# Patient Record
Sex: Male | Born: 1940 | Race: Black or African American | Hispanic: No | Marital: Married | Smoking: Former smoker
Health system: Southern US, Community
[De-identification: ages and names within clinical notes are randomized; demographics above are authoritative.]

## PROBLEM LIST (undated history)

## (undated) DIAGNOSIS — E041 Nontoxic single thyroid nodule: Secondary | ICD-10-CM

## (undated) DIAGNOSIS — K5730 Diverticulosis of large intestine without perforation or abscess without bleeding: Secondary | ICD-10-CM

## (undated) DIAGNOSIS — M109 Gout, unspecified: Secondary | ICD-10-CM

## (undated) DIAGNOSIS — D649 Anemia, unspecified: Secondary | ICD-10-CM

## (undated) DIAGNOSIS — Z992 Dependence on renal dialysis: Secondary | ICD-10-CM

## (undated) DIAGNOSIS — J069 Acute upper respiratory infection, unspecified: Secondary | ICD-10-CM

## (undated) DIAGNOSIS — N289 Disorder of kidney and ureter, unspecified: Secondary | ICD-10-CM

## (undated) DIAGNOSIS — C801 Malignant (primary) neoplasm, unspecified: Secondary | ICD-10-CM

## (undated) DIAGNOSIS — I724 Aneurysm of artery of lower extremity: Secondary | ICD-10-CM

## (undated) DIAGNOSIS — I509 Heart failure, unspecified: Secondary | ICD-10-CM

## (undated) DIAGNOSIS — I209 Angina pectoris, unspecified: Secondary | ICD-10-CM

## (undated) DIAGNOSIS — E785 Hyperlipidemia, unspecified: Secondary | ICD-10-CM

## (undated) DIAGNOSIS — N186 End stage renal disease: Secondary | ICD-10-CM

## (undated) DIAGNOSIS — I10 Essential (primary) hypertension: Secondary | ICD-10-CM

## (undated) DIAGNOSIS — N2581 Secondary hyperparathyroidism of renal origin: Secondary | ICD-10-CM

## (undated) HISTORY — PX: KNEE SURGERY: SHX244

## (undated) HISTORY — DX: Nontoxic single thyroid nodule: E04.1

## (undated) HISTORY — DX: Diverticulosis of large intestine without perforation or abscess without bleeding: K57.30

## (undated) HISTORY — DX: Anemia, unspecified: D64.9

## (undated) HISTORY — DX: Secondary hyperparathyroidism of renal origin: N25.81

## (undated) HISTORY — DX: Essential (primary) hypertension: I10

## (undated) HISTORY — DX: Acute upper respiratory infection, unspecified: J06.9

## (undated) HISTORY — DX: Aneurysm of artery of lower extremity: I72.4

## (undated) HISTORY — PX: TONSILLECTOMY AND ADENOIDECTOMY: SUR1326

## (undated) HISTORY — PX: AV FISTULA PLACEMENT: SHX1204

## (undated) HISTORY — DX: Gout, unspecified: M10.9

## (undated) HISTORY — DX: Hyperlipidemia, unspecified: E78.5

---

## 2007-04-13 ENCOUNTER — Ambulatory Visit: Payer: Self-pay | Admitting: Vascular Surgery

## 2007-04-23 ENCOUNTER — Ambulatory Visit: Payer: Self-pay | Admitting: Vascular Surgery

## 2007-04-23 ENCOUNTER — Ambulatory Visit (HOSPITAL_COMMUNITY): Admission: RE | Admit: 2007-04-23 | Discharge: 2007-04-23 | Payer: Self-pay | Admitting: Vascular Surgery

## 2007-06-22 ENCOUNTER — Ambulatory Visit: Payer: Self-pay | Admitting: Vascular Surgery

## 2008-04-21 ENCOUNTER — Ambulatory Visit: Payer: Self-pay | Admitting: Vascular Surgery

## 2008-04-21 ENCOUNTER — Ambulatory Visit (HOSPITAL_COMMUNITY): Admission: RE | Admit: 2008-04-21 | Discharge: 2008-04-21 | Payer: Self-pay | Admitting: Vascular Surgery

## 2011-02-18 NOTE — Consult Note (Signed)
VASCULAR SURGERY CONSULTATION   AN, SCHNABEL  DOB:  10-08-1940                                       04/13/2007  ZOXWR#:[id number]   [name] is a 70 year old male patient, who has end-stage renal  disease, but has not been on hemodialysis to date.  He is right-handed.  He has hypertension as his presumed etiology of his renal failure.   On examination today, his blood pressure is 174/123, heart rate is 84,  respirations are 18.  He has excellent brachial and radial pulses  bilaterally.  Cephalic veins are visualized on physical exam and thought  to be very borderline in size in the forearm and upper arm.   Bilateral vein mapping was performed in our lab today, which again  reveals borderline-sized veins, varying from 0.21 to 0.28 in diameter  bilaterally, both in the forearm and upper arm.   I discussed at length the access situation to him and his daughter and  have recommended attempting a left forearm AV fistula (Cimino) and have  explained that there is probably a 50/50 chance that this will not turn  out to be an adequate site for access.  We scheduled that for Friday,  July 18, at [facility] as an outpatient.  Hopefully, we will find a  satisfactory for access for this nice man.   Quita Skye Hart Rochester, M.D.  Electronically Signed  JDL/MEDQ  D:  04/13/2007  T:  04/14/2007  Job:  [id number]   cc:   Wilber Bihari. Caryn Section, M.D.  Cecille Aver, M.D.

## 2011-02-18 NOTE — Procedures (Signed)
CEPHALIC VEIN MAPPING   INDICATION:  Evaluate cephalic veins in both arms   HISTORY:  End-stage renal disease   EXAM:  Duplex, both cephalic veins   The right cephalic vein is compressible.   Diameter measurements range from 0.32 cm to 0.2 cm AP.   The left cephalic vein is compressible.   Diameter measurements range from 0.19 cm AP to 0.41 cm AP.   See attached worksheet for all measurements.   IMPRESSION:  Patient bilateral cephalic vein which are of acceptable  diameter for use as a dialysis access site.   ___________________________________________  Quita Skye. Hart Rochester, M.D.   DP/MEDQ  D:  04/13/2007  T:  04/14/2007  Job:  [id number]

## 2011-02-18 NOTE — Assessment & Plan Note (Signed)
OFFICE VISIT   Tim Kirby, Tim Kirby  DOB:  [birth year]                                       06/22/2007  JXBJY#:[id number]   The patient had an AV fistula in the left forearm (Cimino) created by me  on July 18.  His fistula had an excellent pulse and a palpable thrill  with no evidence of steal distally.  The incision has healed nicely.  I  feel that this will be a satisfactory site for vascular access if he  should require hemodialysis, and he will return to see [place] on a p.r.n.  basis.   Quita Skye Hart Rochester, M.D.  Electronically Signed   JDL/MEDQ  D:  06/22/2007  T:  06/23/2007  Job:  [id number]   cc:   Cecille Aver, M.D.

## 2011-02-18 NOTE — Op Note (Signed)
NAME:  Tim Kirby, Tim Kirby               ACCOUNT NO.:  [id number]   MEDICAL RECORD NO.:  [id number]          PATIENT TYPE:  AMB   LOCATION:  SDS                          FACILITY:  [facility]   PHYSICIAN:  Quita Skye. Hart Rochester, M.D.  DATE OF BIRTH:  Aug 07, 1941   DATE OF PROCEDURE:  04/23/2007  DATE OF DISCHARGE:  04/23/2007                               OPERATIVE REPORT   PREOP DIAGNOSIS:  End-stage renal disease.   POSTOP DIAGNOSIS:  End-stage renal disease.   OPERATION:  Creation of a left radial artery to cephalic vein AV fistula  (Cimino shunt).   SURGEON:  Quita Skye. Hart Rochester, M.D.   FIRST ASSISTANT:  Gae Bon, PA Student   ANESTHESIA:  Local.   PROCEDURE:  The patient was taken to the operating room, and placed in  the supine position at which time the left upper extremity was prepped  with Betadine scrub and solution; and draped in a routine sterile  manner.  After infiltration with 1% Xylocaine, a longitudinal incision  was made just proximal to the wrist.  The cephalic vein was identified  and dissected free.  It was an approximately 2.5-to-3-mm vein being  borderline in size, but did dilate up nicely.  It was ligated distally  and transected.   The radial artery was exposed beneath the fascia.  It was a diffusely  diseased vessel with some plaque within it, but did have a good pulse;  3000 units of heparin given intravenously.  Artery was occluded  proximally and distally with vessel loops, opened with a 15-blade,  extended with the Potts scissors.  It would easily accept a 3-mm  dilator.  The vein was carefully spatulated and anastomosed end-to-side  with 6-0 Prolene.  Vesseloops then released; and there was a palpable  pulse and thrill in the fistula with good flow up the antecubital area.  No protamine was given.   Wound was irrigated with saline, closed in layers with Vicryl in a  subcuticular fashion.  Sterile dressing applied.  The patient taken to  recovery room in  satisfactory condition.      Quita Skye Hart Rochester, M.D.  Electronically Signed     JDL/MEDQ  D:  04/23/2007  T:  04/23/2007  Job:  [id number]

## 2011-02-18 NOTE — Op Note (Signed)
NAME:  CLAIRE, BRIDGE               ACCOUNT NO.:  [id number]   MEDICAL RECORD NO.:  [id number]          PATIENT TYPE:  AMB   LOCATION:  SDS                          FACILITY:  [facility]   PHYSICIAN:  Di Kindle. Edilia Bo, M.D.DATE OF BIRTH:  Jun 05, 1941   DATE OF PROCEDURE:  04/21/2008  DATE OF DISCHARGE:  04/21/2008                               OPERATIVE REPORT   PREOPERATIVE DIAGNOSIS:  Clotted left forearm arteriovenous fistula.   POSTOPERATIVE DIAGNOSIS:  Clotted left forearm arteriovenous fistula.   PROCEDURE:  Thrombectomy of left forearm arteriovenous fistula with vein  patch angioplasty of the cephalic vein and intraoperative fistulogram.   SURGEON:  Di Kindle. Edilia Bo, MD   ASSISTANT:  Zenaida Niece, RNFA   ANESTHESIA:  Local with sedation technique.   PROCEDURE IN DETAIL:  The patient was taken to the operating room  sedated by anesthesia.  The left upper extremity was prepped and draped  in usual sterile fashion.  After the skin was infiltrated with 1%  lidocaine, a longitudinal incision was made over the fistula, and the  tissue was dissected free through a scar tissue.  The patient was then  heparinized.  A transverse venotomy was made using a #4 Fogarty  catheter.  The fistula was thrombectomized, and there was excellent  inflow established with a nice plug which showed no narrowing  proximally.  There was some intimal hyperplasia present in the vein at  the level of the venotomy.  I therefore extended this longitudinally.  There was a vein branch which was large enough to be used as a vein  patch.  Distal thrombectomy was performed.  There was noted to be some  irregularity in pulling the catheter through the vein, but the vein  irrigated fairly easily with heparinized saline.  The vein patch was  then sewn using continuous 6-0 Prolene suture.  At the completion, there  was a good thrill in the fistula.  Intraoperative fistulogram was  obtained which showed  some diffuse irregularity in the vein.  Hemostasis  was obtained in the wound, and the wound was closed with deep layer of 3-  0 Vicryl.  The skin was closed with 4-0 Vicryl.  A sterile dressing was  applied.  The patient tolerated the procedure well and was transferred  to the recovery room in satisfactory condition.  All needle and sponge  counts were correct.      Di Kindle. Edilia Bo, M.D.  Electronically Signed     CSD/MEDQ  D:  04/21/2008  T:  04/22/2008  Job:  [id number]

## 2011-07-04 LAB — POCT I-STAT 4, (NA,K, GLUC, HGB,HCT)
HCT: 42
Hemoglobin: 14.3
Potassium: 4
Sodium: 134 — ABNORMAL LOW

## 2011-07-21 LAB — POCT I-STAT 4, (NA,K, GLUC, HGB,HCT)
Operator id: 181601
Potassium: 3.6

## 2012-09-16 ENCOUNTER — Encounter: Payer: Self-pay | Admitting: Vascular Surgery

## 2012-09-17 ENCOUNTER — Encounter: Payer: Self-pay | Admitting: Vascular Surgery

## 2012-09-17 ENCOUNTER — Ambulatory Visit (INDEPENDENT_AMBULATORY_CARE_PROVIDER_SITE_OTHER): Payer: Medicare Other | Admitting: Vascular Surgery

## 2012-09-17 ENCOUNTER — Encounter (INDEPENDENT_AMBULATORY_CARE_PROVIDER_SITE_OTHER): Payer: Medicare Other | Admitting: *Deleted

## 2012-09-17 VITALS — BP 145/84 | HR 99 | Resp 20 | Ht 68.0 in | Wt 185.0 lb

## 2012-09-17 DIAGNOSIS — I724 Aneurysm of artery of lower extremity: Secondary | ICD-10-CM

## 2012-09-17 DIAGNOSIS — Z01810 Encounter for preprocedural cardiovascular examination: Secondary | ICD-10-CM

## 2012-09-17 NOTE — Progress Notes (Signed)
[facility]  Referred by:  Dr. 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 (09/11/1941) male who presents with chief complaint: big pulse in leg.  The patient is not certain when the pulsation in his R thigh began, but over the last few weeks he notice a large pulse in right thigh.  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  He denies any claudication or rest pain.  He denies any gangrene or foot ulcers.  His atherosclerotic risk factors include: HTN, hyperlipidemia, and former smoker.  He denies any family history of aneurysmal disease or connective tissue disorders.  Past Medical History  Diagnosis Date  . Chronic kidney disease   . Hypertension   . Hyperlipidemia   . Gout   . Secondary hyperparathyroidism   . Anemia   . Colon, diverticulosis   . Thyroid nodule     Past Surgical History  Procedure Date  . Knee surgery     Left cyst removal by Dr. Yates  . Av fistula placement     History   Social History  . Marital Status: Married    Spouse Name: N/A    Number of Children: N/A  . Years of Education: N/A   Occupational History  . Not on file.   Social History Main Topics  . Smoking status: Former Smoker    Types: Pipe    Quit date: 09/18/2007  . Smokeless tobacco: Never Used  . Alcohol Use: No  . Drug Use: No  . Sexually Active: Not on file   Other Topics Concern  . Not on file   Social History Narrative  . No narrative on file    Family History  Problem Relation Age of Onset  . Hypertension Mother   . Diabetes Mother     Current Outpatient Prescriptions on File Prior to Visit  Medication Sig Dispense Refill  . amLODipine (NORVASC) 10 MG tablet Take 10 mg by mouth daily.      . b complex-vitamin c-folic acid (NEPHRO-VITE) 0.8 MG TABS Take 0.8 mg by mouth at bedtime.      . sevelamer (RENVELA) 800 MG tablet  Take 800 mg by mouth 3 (three) times daily with meals.      . simvastatin (ZOCOR) 40 MG tablet Take 40 mg by mouth every evening.        No Known Allergies  REVIEW OF SYSTEMS:  (Positives checked otherwise negative)  CARDIOVASCULAR:  [ ] chest pain, [ ] chest pressure, [ ] palpitations, [ ] shortness of breath when laying flat, [ ] shortness of breath with exertion, [ ] pain in feet when laying flat, [ ] history of blood clot in veins (DVT), [ ] history of phlebitis, [ ] swelling in legs, [ ] varicose veins  PULMONARY:  [ ] productive cough, [ ] asthma, [ ] wheezing  NEUROLOGIC:  [ ] weakness in arms or legs, [ ] numbness in arms or legs, [ ] difficulty speaking or slurred speech, [ ] temporary loss of vision in one eye, [ ] dizziness  HEMATOLOGIC:  [ ] bleeding problems, [ ] problems with blood clotting too easily  MUSCULOSKEL:  [ ] joint pain, [ ] joint swelling  GASTROINTEST:  [ ]  Vomiting blood, [ ]  Blood in stool     GENITOURINARY:  [ ]  Burning with   urination, [ ]  Blood in urine  PSYCHIATRIC:  [ ] history of major depression  INTEGUMENTARY:  [ ] rashes, [ ] ulcers  CONSTITUTIONAL:  [ ] fever, [ ] chills  Physical Examination  Filed Vitals:   09/17/12 1324  BP: 145/84  Pulse: 99  Resp: 20  Height: 5' 8" (1.727 m)  Weight: 185 lb (83.915 kg)   Body mass index is 28.13 kg/(m^2).  General: A&O x 3, WDWN  Head: [place]/AT  Ear/Nose/Throat: Hearing grossly intact, nares w/o erythema or drainage, oropharynx w/o Erythema/Exudate  Eyes: PERRLA, EOMI  Neck: Supple, no nuchal rigidity, no palpable LAD  Pulmonary: Sym exp, good air movt, CTAB, no rales, rhonchi, & wheezing  Cardiac: RRR, Nl S1, S2, no Murmurs, rubs or gallops  Vascular: Vessel Right Left  Radial Palpable Palpable  Ulnar Palpable Palpable  Brachial Palpable Palpable  Carotid Palpable, without bruit Palpable, without bruit  Aorta Not palpable N/A  Femoral Prominently Palpable Prominently Palpable   Popliteal Prominently palpable Prominently palpable  PT Palpable Palpable  DP Palpable Palpable   Gastrointestinal: soft, NTND, -G/R, - HSM, - masses, - CVAT B  Musculoskeletal: M/S 5/5 throughout , Extremities without ischemic changes , pulsatile bulge in distal R thigh  Neurologic: CN 2-12 intact , Pain and light touch intact in extremities , Motor exam as listed above  Psychiatric: Judgment intact, Mood & affect appropriate for pt's clinical situation  Dermatologic: See M/S exam for extremity exam, no rashes otherwise noted  Lymph : No Cervical, Axillary, or Inguinal lymphadenopathy   Non-Invasive Vascular Imaging  BLE arterial duplex (Date: 09/17/12)  RLE: 2 x 2 cm CFA aneurysm, SFA aneurysm 9.5 cm with thrombus, 571 c/s, AK pop aneurysm 2.3 cm, occluded PT and peroneal, patent PT  LLE: 1.7 x 1.7 cm CFA aneurysm, 6.4 x 7.0 cm SFA aneurysm, patent tibial artery  Extensive calcific disease, near occlusion of SFA and PT  Outside Studies/Documentation 15 pages of outside documents were reviewed including: outside hospital MRI report and outpatient nephrology chart.  Medical Decision Making  Tim Kirby is a 71 y.o. male who presents with: B femoropopliteal aneurysms.   This patient is going to need:  1. Aortogram, Bilateral leg runoff to evaluate his anatomy. 2. BLE GSV mapping to see if he has adequate conduit. 3. Cardiology evaluation for pre-operative risk stratification and optimization  I suspect some degree of aneurysmal exclusion and bypass will be needs.  R leg than L leg.    I discussed in depth with the patient the nature of atherosclerosis, and emphasized the importance of maximal medical management including strict control of blood pressure, blood glucose, and lipid levels, antiplatelet agents, obtaining regular exercise, and cessation of smoking.  The patient is aware that without maximal medical management the underlying atherosclerotic disease process  will progress, limiting the benefit of any interventions.  Thank you for allowing us to participate in this patient's care.  Kein Carlberg, MD [facility] [place] Office: [phone] Pager: [phone]  09/17/2012, 2:40 PM   

## 2012-09-17 NOTE — Addendum Note (Signed)
Addended by: Sharee Pimple on: 09/17/2012 03:47 PM   Modules accepted: Orders

## 2012-09-20 ENCOUNTER — Other Ambulatory Visit: Payer: Self-pay

## 2012-09-21 ENCOUNTER — Encounter (HOSPITAL_COMMUNITY): Payer: Self-pay | Admitting: Pharmacy Technician

## 2012-09-21 ENCOUNTER — Encounter: Payer: Self-pay | Admitting: Cardiology

## 2012-09-21 ENCOUNTER — Ambulatory Visit (INDEPENDENT_AMBULATORY_CARE_PROVIDER_SITE_OTHER): Payer: Medicare Other | Admitting: Cardiology

## 2012-09-21 VITALS — BP 132/86 | HR 69 | Ht 68.0 in | Wt 183.0 lb

## 2012-09-21 DIAGNOSIS — R079 Chest pain, unspecified: Secondary | ICD-10-CM | POA: Insufficient documentation

## 2012-09-21 DIAGNOSIS — I724 Aneurysm of artery of lower extremity: Secondary | ICD-10-CM

## 2012-09-21 DIAGNOSIS — I10 Essential (primary) hypertension: Secondary | ICD-10-CM | POA: Insufficient documentation

## 2012-09-21 NOTE — Assessment & Plan Note (Signed)
The patient's symptoms seem to occur at the time of blood pressure. With extensive peripheral vascular disease, one would suspect that he probably does have some underlying coronary artery disease becomes more symptomatic in the setting of hypotension. However this is unclear. We have several approaches that we could take. The patient is scheduled for peripheral angiogram, and it would be helpful to do fluoroscopy of the chest to see if there is significant coronary calcification. It might be best to consider proceeding on to coronary arteriography as opposed to simply doing a nuclear study, we can make this judgment after he gets chance to speak with Dr. Imogene Burn about the various options. The extent of his need for surgery will be determined by the peripheral study. I also need to address with Dr. Imogene Burn what access sites are available for possible catheterization. This will be determined also by the angiogram. If he does have watershed symptoms from coronary artery disease, then preoperative evaluation would be helpful. I will review with Dr. Imogene Burn various approaches here.

## 2012-09-21 NOTE — Patient Instructions (Signed)
Will await the results of Angiogram with Dr Imogene Burn before advising on cardiac clearance.

## 2012-09-21 NOTE — Assessment & Plan Note (Signed)
The patient has hypertension and end-stage renal disease. His blood pressures are currently controlled on almost no medications.

## 2012-09-21 NOTE — Assessment & Plan Note (Signed)
See Dr. Nicky Pugh notes

## 2012-09-21 NOTE — Progress Notes (Signed)
HPI:  This gentleman is seen as an add-on office consultation today. He is a patient followed by Dr. Hyman Hopes. He has end-stage renal disease on the basis of hypertension. He has a popliteal aneurysm, and he seen Dr. Imogene Burn. He is scheduled to have a angiogram of the right lower extremity on Thursday. The patient has had intermittent chest pain. It starts as a cramp on the left side, and then can spread to the size of a golf ball. It comes up in the chest and almost exclusively occurs near the end of dialysis. He does not have any with exertion, although quite frankly the patient is not very active as he has peripheral vascular disease as well. The discomfort tends to occur with dialysis, and lasts 5 minutes or less. He thinks it occurs his blood pressure drops and he feels like he's not breathing. The patient has long-standing hypertension, and his renal disease is on this basis. He was thought to have bilateral femoral-popliteal aneurysms. Cardiac evaluation was recommended for preoperative risk stratification and optimization. His arterial duplex done 09/17/2012 revealed a 2 x 2 centimeter common femoral artery aneurysm, SFA aneurysm 9.5 cm with thrombus 2.3 cm popliteal aneurysm with an occluded PT and peroneal and a patent posterior tibial the left lower extremity revealed a 1.7 x 1.7 common femoral artery aneurysm 6.5 x 7 cm superficial femoral artery aneurysm and patent tibial arteries. He denies any exertional chest pain, and has no prior history of underlying coronary artery disease.  He also denies exertional shortness of breath.  Current Outpatient Prescriptions  Medication Sig Dispense Refill  . amLODipine (NORVASC) 10 MG tablet Take 10 mg by mouth daily.      Marland Kitchen aspirin 325 MG tablet Take 325 mg by mouth daily.      Marland Kitchen b complex-vitamin c-folic acid (NEPHRO-VITE) 0.8 MG TABS Take 0.8 mg by mouth at bedtime.      . sevelamer (RENVELA) 800 MG tablet Take 3,200 mg by mouth 3 (three) times daily with  meals.       . simvastatin (ZOCOR) 40 MG tablet Take 40 mg by mouth every evening.        No Known Allergies  Past Medical History  Diagnosis Date  . Chronic kidney disease   . Hypertension   . Hyperlipidemia   . Gout   . Secondary hyperparathyroidism   . Anemia   . Colon, diverticulosis   . Thyroid nodule     Past Surgical History  Procedure Date  . Knee surgery     Left cyst removal by Dr. Ophelia Charter  . Av fistula placement     Family History  Problem Relation Age of Onset  . Hypertension Mother   . Diabetes Mother   Father died at age 54.    History   Social History  . Marital Status: Married    Spouse Name: N/A    Number of Children: N/A  . Years of Education: N/A   Occupational History  . Not on file.   Social History Main Topics  . Smoking status: Former Smoker    Types: Pipe    Quit date: 09/18/2007  . Smokeless tobacco: Never Used  . Alcohol Use: No  . Drug Use: No  . Sexually Active: Not on file   Other Topics Concern  . Not on file   Social History Narrative  . No narrative on file    ROS: Please see the HPI.  All other systems reviewed and negative.  PHYSICAL EXAM:  BP 132/86  Pulse 69  Ht 5\' 8"  (1.727 m)  Wt 183 lb (83.008 kg)  BMI 27.82 kg/m2  SpO2 97%  General: Well developed, well nourished, in no acute distress. Head:  Normocephalic and atraumatic. Neck: no JVD Lungs: Clear to auscultation and percussion. Heart: Normal S1 and S2.  No murmur, rubs.  Pos S4 gallop. Abdomen:  Normal bowel sounds; soft; non tender; no organomegaly Pulses: Both femoral arteries palpable.  R knee scar.   Extremities: No clubbing or cyanosis. No edema. Neurologic: Alert and oriented x 3.  EKG:  NSR.  WNL  ASSESSMENT AND PLAN:

## 2012-09-22 ENCOUNTER — Other Ambulatory Visit: Payer: Self-pay

## 2012-09-27 ENCOUNTER — Encounter (HOSPITAL_COMMUNITY)
Admission: RE | Disposition: A | Discharge status: Home / Self Care | Payer: Self-pay | Source: Ambulatory Visit | Attending: Vascular Surgery

## 2012-09-27 ENCOUNTER — Ambulatory Visit (HOSPITAL_COMMUNITY)
Admission: RE | Admit: 2012-09-27 | Discharge: 2012-09-27 | Disposition: A | Discharge status: Home / Self Care | Payer: Medicare Other | Source: Ambulatory Visit | Attending: Vascular Surgery | Admitting: Vascular Surgery

## 2012-09-27 DIAGNOSIS — E785 Hyperlipidemia, unspecified: Secondary | ICD-10-CM | POA: Insufficient documentation

## 2012-09-27 DIAGNOSIS — I70219 Atherosclerosis of native arteries of extremities with intermittent claudication, unspecified extremity: Secondary | ICD-10-CM

## 2012-09-27 DIAGNOSIS — N189 Chronic kidney disease, unspecified: Secondary | ICD-10-CM | POA: Insufficient documentation

## 2012-09-27 DIAGNOSIS — I129 Hypertensive chronic kidney disease with stage 1 through stage 4 chronic kidney disease, or unspecified chronic kidney disease: Secondary | ICD-10-CM | POA: Insufficient documentation

## 2012-09-27 DIAGNOSIS — I724 Aneurysm of artery of lower extremity: Secondary | ICD-10-CM | POA: Insufficient documentation

## 2012-09-27 DIAGNOSIS — N2581 Secondary hyperparathyroidism of renal origin: Secondary | ICD-10-CM | POA: Insufficient documentation

## 2012-09-27 HISTORY — PX: ABDOMINAL AORTAGRAM: SHX5454

## 2012-09-27 LAB — POCT I-STAT, CHEM 8
BUN: 55 mg/dL — ABNORMAL HIGH (ref 6–23)
Creatinine, Ser: 8 mg/dL — ABNORMAL HIGH (ref 0.50–1.35)
Glucose, Bld: 88 mg/dL (ref 70–99)
Potassium: 4.8 mEq/L (ref 3.5–5.1)
Sodium: 135 mEq/L (ref 135–145)

## 2012-09-27 SURGERY — ABDOMINAL AORTAGRAM
Anesthesia: LOCAL

## 2012-09-27 MED ORDER — LIDOCAINE HCL (PF) 1 % IJ SOLN
INTRAMUSCULAR | Status: AC
  Filled 2012-09-27: qty 30

## 2012-09-27 MED ORDER — HEPARIN (PORCINE) IN NACL 2-0.9 UNIT/ML-% IJ SOLN
INTRAMUSCULAR | Status: AC
  Filled 2012-09-27: qty 1000

## 2012-09-27 MED ORDER — FENTANYL CITRATE 0.05 MG/ML IJ SOLN
INTRAMUSCULAR | Status: AC
  Filled 2012-09-27: qty 2

## 2012-09-27 MED ORDER — LABETALOL HCL 5 MG/ML IV SOLN: 10.0000 mg | INTRAVENOUS | Status: DC | PRN

## 2012-09-27 MED ORDER — MIDAZOLAM HCL 2 MG/2ML IJ SOLN
INTRAMUSCULAR | Status: AC
  Filled 2012-09-27: qty 2

## 2012-09-27 MED ORDER — SODIUM CHLORIDE 0.9 % IV SOLN
INTRAVENOUS | Status: DC
  Administered 2012-09-27: 08:00:00 via INTRAVENOUS

## 2012-09-27 MED ORDER — ONDANSETRON HCL 4 MG/2ML IJ SOLN: 4.0000 mg | Freq: Four times a day (QID) | INTRAMUSCULAR | Status: DC | PRN

## 2012-09-27 MED ORDER — ACETAMINOPHEN 325 MG PO TABS: 650.0000 mg | ORAL_TABLET | ORAL | Status: DC | PRN

## 2012-09-27 NOTE — Interval H&P Note (Signed)
History and Physical Interval Note:  09/27/2012 10:01 AM  Tim Kirby  has presented today for surgery, with the diagnosis of pvd/right femoral an  The various methods of treatment have been discussed with the patient and family. After consideration of risks, benefits and other options for treatment, the patient has consented to  Procedure(s) (LRB) with comments: ABDOMINAL AORTAGRAM (N/A) as a surgical intervention .  The patient's history has been reviewed, patient examined, no change in status, stable for surgery.  I have reviewed the patient's chart and labs.  Questions were answered to the patient's satisfaction.     DICKSON,CHRISTOPHER S

## 2012-09-27 NOTE — Op Note (Signed)
PATIENT: Tim Kirby  MRN: [id number] DOB: Sep 14, 1941    DATE OF PROCEDURE: 09/27/2012  INDICATIONS: Tim Kirby is a 71 y.o. 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 I was asked to proceed with diagnostic arteriography in order to plan elective repair.  PROCEDURE:  1. Ultrasound-guided access to the left common femoral artery 2. Aortogram and bilateral iliac arteriogram 3. Selective catheterization of the right external iliac artery with right lower extremity runoff. 4. Retrograde left femoral arteriogram  SURGEON: Di Kindle. Edilia Bo, MD, FACS  ANESTHESIA: local with sedation   EBL: minimal  TECHNIQUE: The patient was brought to the peripheral vascular lab and sedated with 1 mg of Versed and 50 mcg of fentanyl. Both groins were prepped and draped in the usual sterile fashion. After the skin was infiltrated with 1% lidocaine, and under ultrasound guidance, the left common femoral artery was cannulated and a guidewire introduced into the infrarenal aorta under fluoroscopic control. A 5 French sheath was introduced over the wire. A pigtail catheter was positioned at the L1 vertebral body. Flush aortogram was obtained. The catheter was positioned above the aortic bifurcation and oblique iliac rejection was obtained. Next bilateral lower extremity runoff films were obtained. In order to get better visualization of the tibials on the right I attempted to selectively cannulate the right common iliac artery and ultimately was able to cannulate using the pigtail catheter. The wire was advanced into the external iliac artery and then the pigtail catheter positioned in the distal external iliac artery on the right. Selective right external iliac arteriogram was obtained with a spot film of the right leg. Next the catheter was removed over a wire and a retrograde left femoral arteriogram was obtained in order to get a  spot film of the left leg.  FINDINGS:  1. The patient has arteriomegaly involving the common iliac and external iliac arteries bilaterally. Bilateral renal arteries and the infrarenal aorta are patent. 2. On the right side, the common femoral and deep femoral artery are patent. There is moderate diffuse disease throughout the superficial femoral artery on the right with severe calcific disease. The patient reportedly has a large superficial femoral artery aneurysm on the right this cannot be determined by this study however. 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 The below-knee pop artery is patent. The anterior tibial and posterior tibial arteries are occluded. The peroneal artery in the right is patent proximally but then is occluded. There are collaterals distally. 3. On the left side, the common femoral and deep femoral artery are patent. The superficial femoral artery has moderate diffuse disease and a severely calcified. 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 The below-knee popliteal artery is patent and is single vessel runoff on the left via the peroneal artery. The anterior tibial and posterior tibial arteries are occluded.  Waverly Ferrari, MD, FACS [facility]  DATE OF DICTATION:   09/27/2012

## 2012-09-27 NOTE — H&P (View-Only) (Signed)
[facility] [place]  Referred by:  Dr. 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 (1941-01-23) male who presents with chief complaint: big pulse in leg.  The patient is not certain when the pulsation in his R thigh began, but over the last few weeks he notice a large pulse in right thigh.  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  He denies any claudication or rest pain.  He denies any gangrene or foot ulcers.  His atherosclerotic risk factors include: HTN, hyperlipidemia, and former smoker.  He denies any family history of aneurysmal disease or connective tissue disorders.  Past Medical History  Diagnosis Date  . Chronic kidney disease   . Hypertension   . Hyperlipidemia   . Gout   . Secondary hyperparathyroidism   . Anemia   . Colon, diverticulosis   . Thyroid nodule     Past Surgical History  Procedure Date  . Knee surgery     Left cyst removal by Dr. Ophelia Charter  . Av fistula placement     History   Social History  . Marital Status: Married    Spouse Name: N/A    Number of Children: N/A  . Years of Education: N/A   Occupational History  . Not on file.   Social History Main Topics  . Smoking status: Former Smoker    Types: Pipe    Quit date: 09/18/2007  . Smokeless tobacco: Never Used  . Alcohol Use: No  . Drug Use: No  . Sexually Active: Not on file   Other Topics Concern  . Not on file   Social History Narrative  . No narrative on file    Family History  Problem Relation Age of Onset  . Hypertension Mother   . Diabetes Mother     Current Outpatient Prescriptions on File Prior to Visit  Medication Sig Dispense Refill  . amLODipine (NORVASC) 10 MG tablet Take 10 mg by mouth daily.      Marland Kitchen b complex-vitamin c-folic acid (NEPHRO-VITE) 0.8 MG TABS Take 0.8 mg by mouth at bedtime.      . sevelamer (RENVELA) 800 MG tablet  Take 800 mg by mouth 3 (three) times daily with meals.      . simvastatin (ZOCOR) 40 MG tablet Take 40 mg by mouth every evening.        No Known Allergies  REVIEW OF SYSTEMS:  (Positives checked otherwise negative)  CARDIOVASCULAR:  [ ]  chest pain, [ ]  chest pressure, [ ]  palpitations, [ ]  shortness of breath when laying flat, [ ]  shortness of breath with exertion, [ ]  pain in feet when laying flat, [ ]  history of blood clot in veins (DVT), [ ]  history of phlebitis, [ ]  swelling in legs, [ ]  varicose veins  PULMONARY:  [ ]  productive cough, [ ]  asthma, [ ]  wheezing  NEUROLOGIC:  [ ]  weakness in arms or legs, [ ]  numbness in arms or legs, [ ]  difficulty speaking or slurred speech, [ ]  temporary loss of vision in one eye, [ ]  dizziness  HEMATOLOGIC:  [ ]  bleeding problems, [ ]  problems with blood clotting too easily  MUSCULOSKEL:  [ ]  joint pain, [ ]  joint swelling  GASTROINTEST:  [ ]   Vomiting blood, [ ]   Blood in stool     GENITOURINARY:  [ ]   Burning with  urination, [ ]   Blood in urine  PSYCHIATRIC:  [ ]  history of major depression  INTEGUMENTARY:  [ ]  rashes, [ ]  ulcers  CONSTITUTIONAL:  [ ]  fever, [ ]  chills  Physical Examination  Filed Vitals:   09/17/12 1324  BP: 145/84  Pulse: 99  Resp: 20  Height: 5\' 8"  (1.727 m)  Weight: 185 lb (83.915 kg)   Body mass index is 28.13 kg/(m^2).  General: A&O x 3, WDWN  Head: [place]/AT  Ear/Nose/Throat: Hearing grossly intact, nares w/o erythema or drainage, oropharynx w/o Erythema/Exudate  Eyes: PERRLA, EOMI  Neck: Supple, no nuchal rigidity, no palpable LAD  Pulmonary: Sym exp, good air movt, CTAB, no rales, rhonchi, & wheezing  Cardiac: RRR, Nl S1, S2, no Murmurs, rubs or gallops  Vascular: Vessel Right Left  Radial Palpable Palpable  Ulnar Palpable Palpable  Brachial Palpable Palpable  Carotid Palpable, without bruit Palpable, without bruit  Aorta Not palpable N/A  Femoral Prominently Palpable Prominently Palpable   Popliteal Prominently palpable Prominently palpable  PT Palpable Palpable  DP Palpable Palpable   Gastrointestinal: soft, NTND, -G/R, - HSM, - masses, - CVAT B  Musculoskeletal: M/S 5/5 throughout , Extremities without ischemic changes , pulsatile bulge in distal R thigh  Neurologic: CN 2-12 intact , Pain and light touch intact in extremities , Motor exam as listed above  Psychiatric: Judgment intact, Mood & affect appropriate for pt's clinical situation  Dermatologic: See M/S exam for extremity exam, no rashes otherwise noted  Lymph : No Cervical, Axillary, or Inguinal lymphadenopathy   Non-Invasive Vascular Imaging  BLE arterial duplex (Date: 09/17/12)  RLE: 2 x 2 cm CFA aneurysm, SFA aneurysm 9.5 cm with thrombus, 571 c/s, AK pop aneurysm 2.3 cm, occluded PT and peroneal, patent PT  LLE: 1.7 x 1.7 cm CFA aneurysm, 6.4 x 7.0 cm SFA aneurysm, patent tibial artery  Extensive calcific disease, near occlusion of SFA and PT  Outside Studies/Documentation 15 pages of outside documents were reviewed including: outside hospital MRI report and outpatient nephrology chart.  Medical Decision Making  Tim Kirby is a 71 y.o. male who presents with: B femoropopliteal aneurysms.   This patient is going to need:  1. Aortogram, Bilateral leg runoff to evaluate his anatomy. 2. BLE GSV mapping to see if he has adequate conduit. 3. Cardiology evaluation for pre-operative risk stratification and optimization  I suspect some degree of aneurysmal exclusion and bypass will be needs.  R leg than L leg.    I discussed in depth with the patient the nature of atherosclerosis, and emphasized the importance of maximal medical management including strict control of blood pressure, blood glucose, and lipid levels, antiplatelet agents, obtaining regular exercise, and cessation of smoking.  The patient is aware that without maximal medical management the underlying atherosclerotic disease process  will progress, limiting the benefit of any interventions.  Thank you for allowing [place] to participate in this patient's care.  Leonides Sake, MD [facility] [place] Office: [phone] Pager: [phone]  09/17/2012, 2:40 PM

## 2012-09-28 ENCOUNTER — Other Ambulatory Visit: Payer: Self-pay | Admitting: *Deleted

## 2012-09-28 DIAGNOSIS — I739 Peripheral vascular disease, unspecified: Secondary | ICD-10-CM

## 2012-09-28 DIAGNOSIS — Z01810 Encounter for preprocedural cardiovascular examination: Secondary | ICD-10-CM

## 2012-09-30 ENCOUNTER — Encounter: Payer: Self-pay | Admitting: Vascular Surgery

## 2012-10-01 ENCOUNTER — Encounter: Payer: Self-pay | Admitting: Vascular Surgery

## 2012-10-01 ENCOUNTER — Other Ambulatory Visit: Payer: Self-pay | Admitting: Vascular Surgery

## 2012-10-01 ENCOUNTER — Ambulatory Visit: Payer: Medicare Other | Admitting: Vascular Surgery

## 2012-10-01 ENCOUNTER — Encounter (INDEPENDENT_AMBULATORY_CARE_PROVIDER_SITE_OTHER): Payer: Medicare Other | Admitting: *Deleted

## 2012-10-01 ENCOUNTER — Ambulatory Visit (INDEPENDENT_AMBULATORY_CARE_PROVIDER_SITE_OTHER): Payer: Medicare Other | Admitting: Vascular Surgery

## 2012-10-01 VITALS — BP 147/88 | HR 78 | Resp 18 | Ht 68.0 in | Wt 186.0 lb

## 2012-10-01 DIAGNOSIS — I724 Aneurysm of artery of lower extremity: Secondary | ICD-10-CM

## 2012-10-01 DIAGNOSIS — N186 End stage renal disease: Secondary | ICD-10-CM

## 2012-10-01 DIAGNOSIS — I739 Peripheral vascular disease, unspecified: Secondary | ICD-10-CM

## 2012-10-01 DIAGNOSIS — Z01810 Encounter for preprocedural cardiovascular examination: Secondary | ICD-10-CM

## 2012-10-01 LAB — BUN: BUN: 34 mg/dL — ABNORMAL HIGH (ref 6–23)

## 2012-10-01 LAB — CREATININE, SERUM: Creat: 9.25 mg/dL — ABNORMAL HIGH (ref 0.50–1.35)

## 2012-10-01 NOTE — Progress Notes (Signed)
[facility] [place]  Postoperative Visit  History of Present Illness  Tim Kirby is a 71 y.o. male who presents for postoperative follow-up from procedure on Date: 09/27/12: Aortogram, bilateral leg runoff.  The patient denies any rest pain or claudication.  He denies any ulcers or gangrene in either foot.  The patient is in the process of cardiac work-up with Dr. Stuckey.  He denies any current chest pain or pressure.  Past Medical History  Diagnosis Date  . Chronic kidney disease   . Hypertension   . Hyperlipidemia   . Gout   . Secondary hyperparathyroidism   . Anemia   . Colon, diverticulosis   . Thyroid nodule     Past Surgical History  Procedure Date  . Knee surgery     Left cyst removal by Dr. Yates  . Av fistula placement     left UA AVF    History   Social History  . Marital Status: Married    Spouse Name: N/A    Number of Children: N/A  . Years of Education: N/A   Occupational History  . Not on file.   Social History Main Topics  . Smoking status: Former Smoker    Types: Pipe    Quit date: 09/18/2007  . Smokeless tobacco: Never Used  . Alcohol Use: No  . Drug Use: No  . Sexually Active: Not on file   Other Topics Concern  . Not on file   Social History Narrative  . No narrative on file    Family History  Problem Relation Age of Onset  . Hypertension Mother   . Diabetes Mother     Current Outpatient Prescriptions on File Prior to Visit  Medication Sig Dispense Refill  . amLODipine (NORVASC) 10 MG tablet Take 10 mg by mouth daily.      . aspirin 325 MG tablet Take 325 mg by mouth daily.      . b complex-vitamin c-folic acid (NEPHRO-VITE) 0.8 MG TABS Take 0.8 mg by mouth at bedtime.      . sevelamer (RENVELA) 800 MG tablet Take 3,200 mg by mouth 3 (three) times daily with meals.       . simvastatin (ZOCOR) 40 MG tablet Take 40 mg by mouth every evening.        No Known Allergies  REVIEW OF SYSTEMS: (Positives  checked otherwise negative)  CARDIOVASCULAR: [ ] chest pain, [ ] chest pressure, [ ] palpitations, [ ] shortness of breath when laying flat, [ ] shortness of breath with exertion, [ ] pain in feet when laying flat, [ ] history of blood clot in veins (DVT), [ ] history of phlebitis, [ ] swelling in legs, [ ] varicose veins  PULMONARY: [ ] productive cough, [ ] asthma, [ ] wheezing  NEUROLOGIC: [ ] weakness in arms or legs, [ ] numbness in arms or legs, [ ] difficulty speaking or slurred speech, [ ] temporary loss of vision in one eye, [ ] dizziness  HEMATOLOGIC: [ ] bleeding problems, [ ] problems with blood clotting too easily  MUSCULOSKEL: [ ] joint pain, [ ] joint swelling  GASTROINTEST: [ ] Vomiting blood, [ ] Blood in stool  GENITOURINARY: [ ] Burning with urination, [ ] Blood in urine  PSYCHIATRIC: [ ] history of major depression  INTEGUMENTARY: [ ] rashes, [ ] ulcers  CONSTITUTIONAL: [ ] fever, [ ] chills   Physical Examination  Filed Vitals:   10/01/12 1340    BP: 147/88  Pulse: 78  Resp: 18  Height: 5' 8" (1.727 m)  Weight: 186 lb (84.369 kg)  SpO2: 98%   Body mass index is 28.28 kg/(m^2).  General: A&O x 3, WDWN  Pulmonary: Sym exp, good air movt, CTAB, no rales, rhonchi, & wheezing  Cardiac: RRR, Nl S1, S2, no Murmurs, rubs or gallops  Vascular: Vessel Right Left  Radial Palpable Palpable  Ulnar Palpable Palpable  Brachial Palpable Palpable  Carotid Palpable, without bruit Palpable, without bruit  Aorta Not palpable N/A  Femoral Palpable Palpable  Popliteal Not palpable Not palpable  PT Not Palpable Not Palpable  DP Not Palpable Not Palpable    Gastrointestinal: soft, NTND, -G/R, - HSM, - masses, - CVAT B  Musculoskeletal: M/S 5/5 throughout , Extremities without ischemic changes , left groin without hematoma, no echymosis present at cannulation site  Neurologic:  Pain and light touch intact in extremities , Motor exam as listed above  BLE GSV Mapping (Date:  10/01/12)  R: GSV adequate with possible thickening in calf  L: GSV adequate throughout  Medical Decision Making  [name] is a 71 y.o. 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   The R leg appears to be at most risk on angiogram.  Essentially he has no distal target in the right leg and functionally an fem-BK pop bypass is a bypass to a popliteal island. The risk, benefits, and alternative for bypass operations were discussed with the patient.  The patient is aware the risks include but are not limited to: bleeding, infection, myocardial infarction, stroke, limb loss, nerve damage, need for additional procedures in the future, wound complications, and inability to complete the bypass.   I clearly reiterated that he is a risk of limb loss bilaterally and that the bypass on the right was at risk of failure due to poor outflow status. The patient is aware of these risks and agreed to proceed on 13 JAN 14. This should also give Dr. Stuckey time to complete any additional cardiac risk stratification as needed. I discussed in depth with the patient the nature of atherosclerosis, and emphasized the importance of maximal medical management including strict control of blood pressure, blood glucose, and lipid levels, obtaining regular exercise, and cessation of smoking.  The patient is aware that without maximal medical management the underlying atherosclerotic disease process will progress, limiting the benefit of any interventions.  Thank you for allowing us to participate in this patient's care.  Brian Chen, MD [facility] [place] Office: [phone] Pager: [phone]   

## 2012-10-05 ENCOUNTER — Other Ambulatory Visit: Payer: Self-pay

## 2012-10-07 ENCOUNTER — Telehealth: Payer: Self-pay

## 2012-10-07 DIAGNOSIS — I10 Essential (primary) hypertension: Secondary | ICD-10-CM

## 2012-10-07 DIAGNOSIS — R079 Chest pain, unspecified: Secondary | ICD-10-CM

## 2012-10-07 NOTE — Telephone Encounter (Signed)
The pt has been scheduled for appointments.  I spoke with the pt's wife and gave her instructions about these tests.

## 2012-10-07 NOTE — Telephone Encounter (Signed)
Per Dr Riley Kill this pt needs an Echocardiogram and Lexiscan Myoview as well as follow-up appointment prior to surgical clearance. The pt has dialysis on Tues, Thurs and Saturday. I left the pt a message to contact the office to schedule pre-op testing.

## 2012-10-08 ENCOUNTER — Ambulatory Visit
Admission: RE | Admit: 2012-10-08 | Discharge: 2012-10-08 | Disposition: A | Discharge status: Home / Self Care | Payer: Medicare Other | Source: Ambulatory Visit | Attending: Vascular Surgery | Admitting: Vascular Surgery

## 2012-10-08 ENCOUNTER — Encounter (HOSPITAL_COMMUNITY): Payer: Self-pay | Admitting: Pharmacy Technician

## 2012-10-08 DIAGNOSIS — I724 Aneurysm of artery of lower extremity: Secondary | ICD-10-CM

## 2012-10-08 DIAGNOSIS — I739 Peripheral vascular disease, unspecified: Secondary | ICD-10-CM

## 2012-10-08 DIAGNOSIS — Z01810 Encounter for preprocedural cardiovascular examination: Secondary | ICD-10-CM

## 2012-10-08 MED ORDER — IOHEXOL 350 MG/ML SOLN
150.0000 mL | Freq: Once | INTRAVENOUS | Status: AC | PRN
  Administered 2012-10-08: 150 mL via INTRAVENOUS

## 2012-10-11 ENCOUNTER — Ambulatory Visit (HOSPITAL_COMMUNITY): Payer: Medicare Other | Attending: Cardiology | Admitting: Radiology

## 2012-10-11 VITALS — Ht 68.0 in | Wt 186.0 lb

## 2012-10-11 DIAGNOSIS — R0609 Other forms of dyspnea: Secondary | ICD-10-CM | POA: Insufficient documentation

## 2012-10-11 DIAGNOSIS — I10 Essential (primary) hypertension: Secondary | ICD-10-CM | POA: Insufficient documentation

## 2012-10-11 DIAGNOSIS — R079 Chest pain, unspecified: Secondary | ICD-10-CM

## 2012-10-11 DIAGNOSIS — R0989 Other specified symptoms and signs involving the circulatory and respiratory systems: Secondary | ICD-10-CM | POA: Insufficient documentation

## 2012-10-11 DIAGNOSIS — R55 Syncope and collapse: Secondary | ICD-10-CM | POA: Insufficient documentation

## 2012-10-11 DIAGNOSIS — R42 Dizziness and giddiness: Secondary | ICD-10-CM | POA: Insufficient documentation

## 2012-10-11 DIAGNOSIS — R0602 Shortness of breath: Secondary | ICD-10-CM

## 2012-10-11 DIAGNOSIS — I739 Peripheral vascular disease, unspecified: Secondary | ICD-10-CM | POA: Insufficient documentation

## 2012-10-11 MED ORDER — TECHNETIUM TC 99M SESTAMIBI GENERIC - CARDIOLITE
30.0000 | Freq: Once | INTRAVENOUS | Status: AC | PRN
  Administered 2012-10-11: 30 via INTRAVENOUS

## 2012-10-11 MED ORDER — REGADENOSON 0.4 MG/5ML IV SOLN
0.4000 mg | Freq: Once | INTRAVENOUS | Status: AC
  Administered 2012-10-11: 0.4 mg via INTRAVENOUS

## 2012-10-11 MED ORDER — TECHNETIUM TC 99M SESTAMIBI GENERIC - CARDIOLITE
10.0000 | Freq: Once | INTRAVENOUS | Status: AC | PRN
  Administered 2012-10-11: 10 via INTRAVENOUS

## 2012-10-11 NOTE — Progress Notes (Signed)
[facility] SITE 3 NUCLEAR MED [address] [place], [place] [address] [phone]    Cardiology Nuclear Med Study  Tim Kirby is a 72 y.o. male     MRN : [id number]     DOB: [birth year]  Procedure Date: 10/11/2012  Nuclear Med Background Indication for Stress Test:  Evaluation for Ischemia, and Surgical Clearance for  Popliteal Bypass on 10-18-12 by Dr. Leonides Sake History:  Dialysis,and No prior known history of CAD Cardiac Risk Factors: History of Smoking, Hypertension, Lipids and PVD  Symptoms: Chest Pain after coughing, and dialysis without exertion (last occurrence 6 days ago),  Diaphoresis, Dizziness, DOE, Fatigue, Fatigue with Exertion, Light-Headedness, Nausea, Near Syncope, SOB and Vomiting   Nuclear Pre-Procedure Caffeine/Decaff Intake:  None > 12 hrs NPO After: 4:00pm   Lungs:  clear O2 Sat: 98% on room air. IV 0.9% NS with Angio Cath:  20g  IV Site: R Hand x 1, tolerated well IV Started by:  Irean Hong, RN  Chest Size (in):  44 Cup Size: n/a  Height: 5\' 8"  (1.727 m)  Weight:  186 lb (84.369 kg)  BMI:  Body mass index is 28.28 kg/(m^2). Tech Comments:  Took Norvasc this am    Nuclear Med Study 1 or 2 day study: 1 day  Stress Test Type:  Lexiscan  Reading MD: Cassell Clement, MD  Order Authorizing Provider:  Shawnie Pons, MD  Resting Radionuclide: Technetium 47m Sestamibi  Resting Radionuclide Dose: 10.9  mCi   Stress Radionuclide:  Technetium 24m Sestamibi  Stress Radionuclide Dose: 33.0 mCi           Stress Protocol Rest HR: 61 Stress HR: 71  Rest BP: 141/81 Stress BP: 133/67  Exercise Time (min): n/a METS: n/a   Predicted Max HR: 149 bpm % Max HR: 47.65 bpm Rate Pressure Product: 9443    Dose of Adenosine (mg):  n/a Dose of Lexiscan: 0.4 mg  Dose of Atropine (mg): n/a Dose of Dobutamine: n/a mcg/kg/min (at max HR)  Stress Test Technologist: Irean Hong, RN  Nuclear Technologist:  Domenic Polite, CNMT     Rest Procedure:   Myocardial perfusion imaging was performed at rest 45 minutes following the intravenous administration of Technetium 73m Sestamibi. Rest ECG: NSR - Normal EKG  Stress Procedure:  The patient received IV Lexiscan 0.4 mg over 15-seconds.  Technetium 47m Sestamibi injected at 30-seconds. The patient complained of warmth,but denied chest pain.  Quantitative spect images were obtained after a 45 minute delay. Stress ECG: No significant change from baseline ECG  QPS Raw Data Images:  Normal; no motion artifact; normal heart/lung ratio. Stress Images:  Normal homogeneous uptake in all areas of the myocardium. Rest Images:  Normal homogeneous uptake in all areas of the myocardium. Subtraction (SDS):  No evidence of ischemia. Transient Ischemic Dilatation (Normal <1.22):  1.05 Lung/Heart Ratio (Normal <0.45):  0.28  Quantitative Gated Spect Images QGS EDV:  144 ml QGS ESV:  57 ml  Impression Exercise Capacity:  Lexiscan with no exercise. BP Response:  Normal blood pressure response. Clinical Symptoms:  No significant symptoms noted. ECG Impression:  No significant ST segment change suggestive of ischemia. Comparison with Prior Nuclear Study: No previous nuclear study performed  Overall Impression:  Normal stress nuclear study.  LV Ejection Fraction: 61%.  LV Wall Motion:  NL LV Function; NL Wall Motion   [employer]

## 2012-10-13 ENCOUNTER — Encounter (HOSPITAL_COMMUNITY): Payer: Self-pay

## 2012-10-13 ENCOUNTER — Encounter (HOSPITAL_COMMUNITY)
Admission: RE | Admit: 2012-10-13 | Discharge: 2012-10-13 | Disposition: A | Discharge status: Home / Self Care | Payer: Medicare Other | Source: Ambulatory Visit | Attending: Vascular Surgery | Admitting: Vascular Surgery

## 2012-10-13 ENCOUNTER — Ambulatory Visit (INDEPENDENT_AMBULATORY_CARE_PROVIDER_SITE_OTHER): Payer: Medicare Other | Admitting: Cardiology

## 2012-10-13 ENCOUNTER — Ambulatory Visit (HOSPITAL_BASED_OUTPATIENT_CLINIC_OR_DEPARTMENT_OTHER): Payer: Medicare Other | Admitting: Radiology

## 2012-10-13 ENCOUNTER — Encounter: Payer: Self-pay | Admitting: Cardiology

## 2012-10-13 ENCOUNTER — Encounter (HOSPITAL_COMMUNITY)
Admission: RE | Admit: 2012-10-13 | Discharge: 2012-10-13 | Disposition: A | Discharge status: Home / Self Care | Payer: Medicare Other | Source: Ambulatory Visit | Attending: Anesthesiology | Admitting: Anesthesiology

## 2012-10-13 VITALS — BP 130/76 | HR 73 | Ht 68.0 in | Wt 183.0 lb

## 2012-10-13 DIAGNOSIS — I10 Essential (primary) hypertension: Secondary | ICD-10-CM

## 2012-10-13 DIAGNOSIS — R079 Chest pain, unspecified: Secondary | ICD-10-CM

## 2012-10-13 DIAGNOSIS — R072 Precordial pain: Secondary | ICD-10-CM

## 2012-10-13 DIAGNOSIS — N186 End stage renal disease: Secondary | ICD-10-CM

## 2012-10-13 HISTORY — DX: Aneurysm of artery of lower extremity: I72.4

## 2012-10-13 HISTORY — DX: Angina pectoris, unspecified: I20.9

## 2012-10-13 LAB — URINALYSIS, ROUTINE W REFLEX MICROSCOPIC
Glucose, UA: NEGATIVE mg/dL
Protein, ur: >300 mg/dL — AB
Specific Gravity, Urine: 1.025 (ref 1.005–1.030)
pH: 6 (ref 5.0–8.0)

## 2012-10-13 LAB — ABO/RH: ABO/RH(D): O POS

## 2012-10-13 LAB — COMPREHENSIVE METABOLIC PANEL
ALT: 10 U/L (ref 0–53)
CO2: 28 mEq/L (ref 19–32)
Calcium: 11.3 mg/dL — ABNORMAL HIGH (ref 8.4–10.5)
Creatinine, Ser: 9.38 mg/dL — ABNORMAL HIGH (ref 0.50–1.35)
GFR calc Af Amer: 6 mL/min — ABNORMAL LOW (ref >90)
GFR calc non Af Amer: 5 mL/min — ABNORMAL LOW (ref >90)
Glucose, Bld: 95 mg/dL (ref 70–99)
Sodium: 136 mEq/L (ref 135–145)
Total Protein: 8.2 g/dL (ref 6.0–8.3)

## 2012-10-13 LAB — CBC
Hemoglobin: 10.9 g/dL — ABNORMAL LOW (ref 13.0–17.0)
MCH: 29.3 pg (ref 26.0–34.0)
MCHC: 31.4 g/dL (ref 30.0–36.0)
MCV: 93.3 fL (ref 78.0–100.0)
Platelets: 241 10*3/uL (ref 150–400)

## 2012-10-13 LAB — URINE MICROSCOPIC-ADD ON

## 2012-10-13 LAB — PROTIME-INR
INR: 1.21 (ref 0.00–1.49)
Prothrombin Time: 15.1 seconds (ref 11.6–15.2)

## 2012-10-13 LAB — SURGICAL PCR SCREEN
MRSA, PCR: NEGATIVE
Staphylococcus aureus: NEGATIVE

## 2012-10-13 NOTE — Assessment & Plan Note (Signed)
Followed by renal. °

## 2012-10-13 NOTE — Progress Notes (Signed)
HPI: We had a thorough discussion today. He is scheduled to undergo major vascular surgery. He underwent radionuclide imaging earlier in the week which did not demonstrate a perfusion defect, and did demonstrate preserved overall LV systolic function. It was felt to be a low-risk test. He says that he does not get symptoms generally unless it is after dialysis. Importantly, he also underwent echocardiography which demonstrated well-preserved left ventricular function.  Importantly, the echo did demonstrate left ventricular hypertrophy.  I've gone through with the patient and his wife and detail risks associated with the operation, and this will be done by the vascular surgeons. I also discussed with him the nature of noninvasive testing prior to major vascular surgery, and both its limitations as well as its benefits.  He does have known coronary calcification is demonstrated on fluoroscopy at the time that he had his vascular procedure done. I was in the room at that time, and personally reviewed the fluoroscopy.    Current Outpatient Prescriptions  Medication Sig Dispense Refill  . amLODipine (NORVASC) 10 MG tablet Take 10 mg by mouth daily.      Marland Kitchen aspirin 325 MG tablet Take 325 mg by mouth daily.      Marland Kitchen b complex-vitamin c-folic acid (NEPHRO-VITE) 0.8 MG TABS Take 0.8 mg by mouth at bedtime.      . sevelamer (RENVELA) 800 MG tablet Take 3,200 mg by mouth 3 (three) times daily with meals.       . simvastatin (ZOCOR) 40 MG tablet Take 40 mg by mouth every evening.        No Known Allergies  Past Medical History  Diagnosis Date  . Chronic kidney disease   . Hypertension   . Hyperlipidemia   . Gout   . Secondary hyperparathyroidism   . Anemia   . Colon, diverticulosis   . Thyroid nodule     Past Surgical History  Procedure Date  . Knee surgery     Left cyst removal by Dr. Ophelia Charter  . Av fistula placement     left UA AVF    Family History  Problem Relation Age of Onset  .  Hypertension Mother   . Diabetes Mother     History   Social History  . Marital Status: Married    Spouse Name: N/A    Number of Children: N/A  . Years of Education: N/A   Occupational History  . Not on file.   Social History Main Topics  . Smoking status: Former Smoker    Types: Pipe    Quit date: 09/18/2007  . Smokeless tobacco: Never Used  . Alcohol Use: No  . Drug Use: No  . Sexually Active: Not on file   Other Topics Concern  . Not on file   Social History Narrative  . No narrative on file    ROS: Please see the HPI.  All other systems reviewed and negative.  PHYSICAL EXAM:  BP 130/76  Pulse 73  Ht 5\' 8"  (1.727 m)  Wt 183 lb (83.008 kg)  BMI 27.82 kg/m2  SpO2 97%  General: Well developed, well nourished, in no acute distress. Head:  Normocephalic and atraumatic. Neck: no JVD Lungs: Clear to auscultation and percussion. Heart: Normal S1 and S2.  1-2/6 SEM.  No DM.   Abdomen:  Normal bowel sounds; soft; non tender; no organomegaly Neurologic: Alert and oriented x 3.  EKG:  ECHO Study Conclusions  - Left ventricle: The cavity size was normal. Wall thickness was  increased in a pattern of moderate LVH. Systolic function was normal. The estimated ejection fraction was in the range of 60% to 65%. - Left atrium: The atrium was moderately dilated.  NUCLEAR  QPS  Raw Data Images: Normal; no motion artifact; normal heart/lung ratio.  Stress Images: Normal homogeneous uptake in all areas of the myocardium.  Rest Images: Normal homogeneous uptake in all areas of the myocardium.  Subtraction (SDS): No evidence of ischemia.  Transient Ischemic Dilatation (Normal <1.22): 1.05  Lung/Heart Ratio (Normal <0.45): 0.28  Quantitative Gated Spect Images  QGS EDV: 144 ml  QGS ESV: 57 ml  Impression  Exercise Capacity: Lexiscan with no exercise.  BP Response: Normal blood pressure response.  Clinical Symptoms: No significant symptoms noted.  ECG Impression:  No significant ST segment change suggestive of ischemia.  Comparison with Prior Nuclear Study: No previous nuclear study performed  Overall Impression: Normal stress nuclear study.  LV Ejection Fraction: 61%. LV Wall Motion: NL LV Function; NL Wall Motion  Cassell Clement    ASSESSMENT AND PLAN:

## 2012-10-13 NOTE — Progress Notes (Signed)
Forwarded cardiac records and abnormal Labs/CXR to anesthesia for review.

## 2012-10-13 NOTE — Pre-Procedure Instructions (Signed)
20 Tim Kirby  10/13/2012   Your procedure is scheduled on:  Monday October 18, 2012  Report to [facility] at 5:30 AM.  Call this number if you have problems the morning of surgery: [phone]   Remember:   Do not eat food or drink :After Midnight.    Take these medicines the morning of surgery with A SIP OF WATER: amlodipine,    Do not wear jewelry, make-up or nail polish.  Do not wear lotions, powders, or perfumes.  Do not shave 48 hours prior to surgery. Men may shave face and neck.  Do not bring valuables to the hospital.  Contacts, dentures or bridgework may not be worn into surgery.  Leave suitcase in the car. After surgery it may be brought to your room.  For patients admitted to the hospital, checkout time is 11:00 AM the day of discharge.   Patients discharged the day of surgery will not be allowed to drive home.  Name and phone number of your driver: family / friend  Special Instructions: Shower using CHG 2 nights before surgery and the night before surgery.  If you shower the day of surgery use CHG.  Use special wash - you have one bottle of CHG for all showers.  You should use approximately 1/3 of the bottle for each shower.   Please read over the following fact sheets that you were given: Pain Booklet, Coughing and Deep Breathing, Blood Transfusion Information, MRSA Information and Surgical Site Infection Prevention

## 2012-10-13 NOTE — Progress Notes (Signed)
Echocardiogram performed.  

## 2012-10-13 NOTE — Assessment & Plan Note (Signed)
Symptoms occur after dialysis mainly.  Nuc imaging suggests normal perfusion with stress.  LV preserved.  Has coronary calcification by fluoro. Likely has underlying CAD, but with normal perfusion no further preop testing warranted.  Will need careful monitoring of volume pre, during and after surgery.  Nephrology will need to be on board.

## 2012-10-14 ENCOUNTER — Other Ambulatory Visit: Payer: Self-pay

## 2012-10-14 ENCOUNTER — Other Ambulatory Visit: Payer: Self-pay | Admitting: *Deleted

## 2012-10-14 NOTE — Consult Note (Signed)
Anesthesia chart review: Patient is a 72 year old male scheduled for right popliteal artery aneurysm exclusion and right femoral to below-knee popliteal bypass by Dr. Imogene Burn on 10/18/2012. 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  Nephrologist is Dr. Hyman Hopes.  He was seen by Cardiologist Dr. Riley Kill for preoperative evaluation on 10/13/12.  He has had post-dialysis chest pain in the past.  He had a normal stress test, but with coronary calcification by fluoro and felt to likely have underlying CAD.  However, with normal perfusion, Dr. Riley Kill did not recommend additional preoperative testing.  He did recommend careful monitoring of his volume status in the perioperative period.  Nuclear stress test on 10/11/12 showed: Normal stress nuclear study. LV Ejection Fraction: 61%. LV Wall Motion: NL LV Function; NL Wall Motion.  Echo 10/13/12 showed: - Left ventricle: The cavity size was normal. Wall thickness was increased in a pattern of moderate LVH. Systolic function was normal. The estimated ejection fraction was in the range of 60% to 65%. - Left atrium: The atrium was moderately dilated. - Aorta: Ascending aorta: The ascending aorta was mildly Dilated. -Tricuspid valve: Doppler: Trivial regurgitation.  EKG on 09/21/12 showed NSR.  BLE arterial duplex (Date: 09/17/12)  RLE: 2 x 2 cm CFA aneurysm, SFA aneurysm 9.5 cm with thrombus, 571 c/s, AK pop aneurysm 2.3 cm, occluded PT and peroneal, patent PT  LLE: 1.7 x 1.7 cm CFA aneurysm, 6.4 x 7.0 cm SFA aneurysm, patent tibial artery  Extensive calcific disease, near occlusion of SFA and PT  CXR on 10/13/12 showed:Cardiomegaly, without acute disease.  Probable left sided nipple shadow. Repeat frontal radiograph with nipple markers could confirm.  Report routed to Dr. Imogene Burn and [facility] nurses Darel Hong and Okey Regal.  Will defer additional orders to Dr.  Imogene Burn.  Preoperative labs noted.  He will get an ISTAT on arrival.  Shonna Chock, [place] 10/14/12 1057

## 2012-10-17 MED ORDER — DEXTROSE 5 % IV SOLN
1.5000 g | INTRAVENOUS | Status: AC
  Administered 2012-10-18: 1.5 g via INTRAVENOUS
  Filled 2012-10-17: qty 1.5

## 2012-10-18 ENCOUNTER — Inpatient Hospital Stay (HOSPITAL_COMMUNITY): Payer: Medicare Other | Admitting: Vascular Surgery

## 2012-10-18 ENCOUNTER — Encounter (HOSPITAL_COMMUNITY): Payer: Self-pay | Admitting: Vascular Surgery

## 2012-10-18 ENCOUNTER — Encounter (HOSPITAL_COMMUNITY)
Admission: RE | Disposition: A | Discharge status: Home / Self Care | Payer: Self-pay | Source: Ambulatory Visit | Attending: Vascular Surgery

## 2012-10-18 ENCOUNTER — Encounter (HOSPITAL_COMMUNITY): Payer: Self-pay | Admitting: *Deleted

## 2012-10-18 ENCOUNTER — Inpatient Hospital Stay (HOSPITAL_COMMUNITY)
Admission: RE | Admit: 2012-10-18 | Discharge: 2012-10-24 | DRG: 237 | Disposition: A | Discharge status: Home / Self Care | Payer: Medicare Other | Source: Ambulatory Visit | Attending: Vascular Surgery | Admitting: Vascular Surgery

## 2012-10-18 DIAGNOSIS — D631 Anemia in chronic kidney disease: Secondary | ICD-10-CM | POA: Diagnosis present

## 2012-10-18 DIAGNOSIS — Z7982 Long term (current) use of aspirin: Secondary | ICD-10-CM

## 2012-10-18 DIAGNOSIS — Z01812 Encounter for preprocedural laboratory examination: Secondary | ICD-10-CM

## 2012-10-18 DIAGNOSIS — N2581 Secondary hyperparathyroidism of renal origin: Secondary | ICD-10-CM | POA: Diagnosis present

## 2012-10-18 DIAGNOSIS — E041 Nontoxic single thyroid nodule: Secondary | ICD-10-CM | POA: Diagnosis present

## 2012-10-18 DIAGNOSIS — J4489 Other specified chronic obstructive pulmonary disease: Secondary | ICD-10-CM | POA: Diagnosis present

## 2012-10-18 DIAGNOSIS — N186 End stage renal disease: Secondary | ICD-10-CM | POA: Diagnosis present

## 2012-10-18 DIAGNOSIS — E785 Hyperlipidemia, unspecified: Secondary | ICD-10-CM | POA: Diagnosis present

## 2012-10-18 DIAGNOSIS — I70219 Atherosclerosis of native arteries of extremities with intermittent claudication, unspecified extremity: Secondary | ICD-10-CM

## 2012-10-18 DIAGNOSIS — M1990 Unspecified osteoarthritis, unspecified site: Secondary | ICD-10-CM | POA: Diagnosis present

## 2012-10-18 DIAGNOSIS — Z992 Dependence on renal dialysis: Secondary | ICD-10-CM

## 2012-10-18 DIAGNOSIS — I120 Hypertensive chronic kidney disease with stage 5 chronic kidney disease or end stage renal disease: Secondary | ICD-10-CM | POA: Diagnosis present

## 2012-10-18 DIAGNOSIS — J449 Chronic obstructive pulmonary disease, unspecified: Secondary | ICD-10-CM | POA: Diagnosis present

## 2012-10-18 DIAGNOSIS — M109 Gout, unspecified: Secondary | ICD-10-CM | POA: Diagnosis present

## 2012-10-18 DIAGNOSIS — Z01818 Encounter for other preprocedural examination: Secondary | ICD-10-CM

## 2012-10-18 DIAGNOSIS — Z79899 Other long term (current) drug therapy: Secondary | ICD-10-CM

## 2012-10-18 DIAGNOSIS — Z87891 Personal history of nicotine dependence: Secondary | ICD-10-CM

## 2012-10-18 DIAGNOSIS — I70209 Unspecified atherosclerosis of native arteries of extremities, unspecified extremity: Secondary | ICD-10-CM | POA: Diagnosis present

## 2012-10-18 DIAGNOSIS — I724 Aneurysm of artery of lower extremity: Principal | ICD-10-CM | POA: Diagnosis present

## 2012-10-18 HISTORY — PX: FEMORAL-POPLITEAL BYPASS GRAFT: SHX937

## 2012-10-18 LAB — POCT I-STAT 4, (NA,K, GLUC, HGB,HCT): Sodium: 136 mEq/L (ref 135–145)

## 2012-10-18 LAB — CBC
Hemoglobin: 10.5 g/dL — ABNORMAL LOW (ref 13.0–17.0)
MCH: 30.6 pg (ref 26.0–34.0)
MCHC: 32.5 g/dL (ref 30.0–36.0)
RDW: 14 % (ref 11.5–15.5)

## 2012-10-18 LAB — CREATININE, SERUM: Creatinine, Ser: 10.62 mg/dL — ABNORMAL HIGH (ref 0.50–1.35)

## 2012-10-18 SURGERY — BYPASS GRAFT FEMORAL-POPLITEAL ARTERY
Anesthesia: General | Site: Leg Lower | Laterality: Right | Wound class: Clean

## 2012-10-18 MED ORDER — ACETAMINOPHEN 325 MG PO TABS
325.0000 mg | ORAL_TABLET | ORAL | Status: DC | PRN
  Administered 2012-10-19 – 2012-10-22 (×2): 650 mg via ORAL
  Filled 2012-10-18 (×2): qty 2

## 2012-10-18 MED ORDER — HYDROMORPHONE HCL PF 1 MG/ML IJ SOLN
INTRAMUSCULAR | Status: AC
  Filled 2012-10-18: qty 1

## 2012-10-18 MED ORDER — SODIUM CHLORIDE 0.9 % IR SOLN
Status: DC | PRN
  Administered 2012-10-18: 08:00:00

## 2012-10-18 MED ORDER — DEXTROSE 5 % IV SOLN
INTRAVENOUS | Status: DC | PRN
  Administered 2012-10-18: 08:00:00 via INTRAVENOUS

## 2012-10-18 MED ORDER — PROTAMINE SULFATE 10 MG/ML IV SOLN
INTRAVENOUS | Status: DC | PRN
  Administered 2012-10-18: 15 mg via INTRAVENOUS
  Administered 2012-10-18: 10 mg via INTRAVENOUS
  Administered 2012-10-18: 15 mg via INTRAVENOUS

## 2012-10-18 MED ORDER — POLYETHYLENE GLYCOL 3350 17 G PO PACK
17.0000 g | PACK | Freq: Every day | ORAL | Status: DC | PRN
  Administered 2012-10-22 – 2012-10-23 (×2): 17 g via ORAL
  Filled 2012-10-18 (×2): qty 1

## 2012-10-18 MED ORDER — HEPARIN SODIUM (PORCINE) 1000 UNIT/ML IJ SOLN
INTRAMUSCULAR | Status: DC | PRN
  Administered 2012-10-18: 6 mL via INTRAVENOUS
  Administered 2012-10-18: 2 mL via INTRAVENOUS

## 2012-10-18 MED ORDER — DOPAMINE-DEXTROSE 3.2-5 MG/ML-% IV SOLN: 3.0000 ug/kg/min | INTRAVENOUS | Status: DC

## 2012-10-18 MED ORDER — MORPHINE SULFATE 2 MG/ML IJ SOLN
2.0000 mg | INTRAMUSCULAR | Status: DC | PRN
  Administered 2012-10-18 (×3): 2 mg via INTRAVENOUS
  Filled 2012-10-18 (×3): qty 1

## 2012-10-18 MED ORDER — SODIUM CHLORIDE 0.9 % IV SOLN: INTRAVENOUS | Status: DC

## 2012-10-18 MED ORDER — SUCCINYLCHOLINE CHLORIDE 20 MG/ML IJ SOLN
INTRAMUSCULAR | Status: DC | PRN
  Administered 2012-10-18: 100 mg via INTRAVENOUS

## 2012-10-18 MED ORDER — PANTOPRAZOLE SODIUM 40 MG PO TBEC
40.0000 mg | DELAYED_RELEASE_TABLET | Freq: Every day | ORAL | Status: DC
  Administered 2012-10-18 – 2012-10-23 (×6): 40 mg via ORAL
  Filled 2012-10-18 (×6): qty 1

## 2012-10-18 MED ORDER — ONDANSETRON HCL 4 MG/2ML IJ SOLN: 4.0000 mg | Freq: Four times a day (QID) | INTRAMUSCULAR | Status: DC | PRN

## 2012-10-18 MED ORDER — SIMVASTATIN 40 MG PO TABS
40.0000 mg | ORAL_TABLET | Freq: Every evening | ORAL | Status: DC
  Administered 2012-10-18: 40 mg via ORAL
  Filled 2012-10-18 (×2): qty 1

## 2012-10-18 MED ORDER — PHENYLEPHRINE HCL 10 MG/ML IJ SOLN
10.0000 mg | INTRAVENOUS | Status: DC | PRN
  Administered 2012-10-18: 25 ug/min via INTRAVENOUS

## 2012-10-18 MED ORDER — HYDROMORPHONE HCL PF 1 MG/ML IJ SOLN
INTRAMUSCULAR | Status: DC | PRN
  Administered 2012-10-18: 0.5 mg via INTRAVENOUS
  Administered 2012-10-18: .25 mg via INTRAVENOUS

## 2012-10-18 MED ORDER — ARTIFICIAL TEARS OP OINT
TOPICAL_OINTMENT | OPHTHALMIC | Status: DC | PRN
  Administered 2012-10-18: 1 via OPHTHALMIC

## 2012-10-18 MED ORDER — MAGNESIUM SULFATE 40 MG/ML IJ SOLN
2.0000 g | Freq: Once | INTRAMUSCULAR | Status: AC | PRN
  Filled 2012-10-18: qty 50

## 2012-10-18 MED ORDER — BISACODYL 10 MG RE SUPP: 10.0000 mg | Freq: Every day | RECTAL | Status: DC | PRN

## 2012-10-18 MED ORDER — RENA-VITE PO TABS
1.0000 | ORAL_TABLET | Freq: Every day | ORAL | Status: DC
  Administered 2012-10-18 – 2012-10-23 (×4): 1 via ORAL
  Filled 2012-10-18 (×7): qty 1

## 2012-10-18 MED ORDER — DOCUSATE SODIUM 100 MG PO CAPS
100.0000 mg | ORAL_CAPSULE | Freq: Every day | ORAL | Status: DC
  Administered 2012-10-19 – 2012-10-23 (×5): 100 mg via ORAL
  Filled 2012-10-18 (×6): qty 1

## 2012-10-18 MED ORDER — OXYCODONE-ACETAMINOPHEN 5-325 MG PO TABS
1.0000 | ORAL_TABLET | ORAL | Status: DC | PRN
  Administered 2012-10-18 – 2012-10-24 (×10): 2 via ORAL
  Filled 2012-10-18 (×10): qty 2

## 2012-10-18 MED ORDER — ONDANSETRON HCL 4 MG/2ML IJ SOLN
INTRAMUSCULAR | Status: DC | PRN
  Administered 2012-10-18: 4 mg via INTRAVENOUS

## 2012-10-18 MED ORDER — SODIUM CHLORIDE 0.9 % IV SOLN
INTRAVENOUS | Status: DC | PRN
  Administered 2012-10-18 (×2): via INTRAVENOUS

## 2012-10-18 MED ORDER — MIDAZOLAM HCL 5 MG/5ML IJ SOLN
INTRAMUSCULAR | Status: DC | PRN
  Administered 2012-10-18: 2 mg via INTRAVENOUS

## 2012-10-18 MED ORDER — METOPROLOL TARTRATE 1 MG/ML IV SOLN: 2.0000 mg | INTRAVENOUS | Status: DC | PRN

## 2012-10-18 MED ORDER — SEVELAMER CARBONATE 800 MG PO TABS
3200.0000 mg | ORAL_TABLET | Freq: Three times a day (TID) | ORAL | Status: DC
  Administered 2012-10-18 – 2012-10-24 (×13): 3200 mg via ORAL
  Filled 2012-10-18 (×20): qty 4

## 2012-10-18 MED ORDER — SURGIFOAM 100 EX MISC
CUTANEOUS | Status: DC | PRN
  Administered 2012-10-18 (×2): via TOPICAL

## 2012-10-18 MED ORDER — DARBEPOETIN ALFA-POLYSORBATE 60 MCG/0.3ML IJ SOLN
60.0000 ug | INTRAMUSCULAR | Status: DC
  Administered 2012-10-19: 60 ug via INTRAVENOUS
  Filled 2012-10-18: qty 0.3

## 2012-10-18 MED ORDER — PHENYLEPHRINE HCL 10 MG/ML IJ SOLN
INTRAMUSCULAR | Status: DC | PRN
  Administered 2012-10-18 (×2): 120 ug via INTRAVENOUS
  Administered 2012-10-18 (×2): 80 ug via INTRAVENOUS

## 2012-10-18 MED ORDER — GUAIFENESIN-DM 100-10 MG/5ML PO SYRP: 15.0000 mL | ORAL_SOLUTION | ORAL | Status: DC | PRN

## 2012-10-18 MED ORDER — HEPARIN SODIUM (PORCINE) 5000 UNIT/ML IJ SOLN
5000.0000 [IU] | Freq: Three times a day (TID) | INTRAMUSCULAR | Status: DC
  Administered 2012-10-18 – 2012-10-24 (×16): 5000 [IU] via SUBCUTANEOUS
  Filled 2012-10-18 (×20): qty 1

## 2012-10-18 MED ORDER — PHENOL 1.4 % MT LIQD: 1.0000 | OROMUCOSAL | Status: DC | PRN

## 2012-10-18 MED ORDER — MORPHINE SULFATE 2 MG/ML IJ SOLN
INTRAMUSCULAR | Status: AC
  Filled 2012-10-18: qty 1

## 2012-10-18 MED ORDER — PROPOFOL 10 MG/ML IV BOLUS
INTRAVENOUS | Status: DC | PRN
  Administered 2012-10-18: 180 mg via INTRAVENOUS

## 2012-10-18 MED ORDER — LIDOCAINE HCL (CARDIAC) 20 MG/ML IV SOLN
INTRAVENOUS | Status: DC | PRN
  Administered 2012-10-18: 100 mg via INTRAVENOUS

## 2012-10-18 MED ORDER — ACETAMINOPHEN 650 MG RE SUPP: 325.0000 mg | RECTAL | Status: DC | PRN

## 2012-10-18 MED ORDER — FENTANYL CITRATE 0.05 MG/ML IJ SOLN
INTRAMUSCULAR | Status: DC | PRN
  Administered 2012-10-18: 50 ug via INTRAVENOUS
  Administered 2012-10-18: 75 ug via INTRAVENOUS
  Administered 2012-10-18: 125 ug via INTRAVENOUS

## 2012-10-18 MED ORDER — FLEET ENEMA 7-19 GM/118ML RE ENEM: 1.0000 | ENEMA | Freq: Once | RECTAL | Status: AC | PRN

## 2012-10-18 MED ORDER — GLYCOPYRROLATE 0.2 MG/ML IJ SOLN
INTRAMUSCULAR | Status: DC | PRN
  Administered 2012-10-18: .5 mg via INTRAVENOUS

## 2012-10-18 MED ORDER — HYDROMORPHONE HCL PF 1 MG/ML IJ SOLN: 0.2500 mg | INTRAMUSCULAR | Status: DC | PRN

## 2012-10-18 MED ORDER — AMLODIPINE BESYLATE 10 MG PO TABS
10.0000 mg | ORAL_TABLET | Freq: Every day | ORAL | Status: DC
  Administered 2012-10-20 – 2012-10-23 (×4): 10 mg via ORAL
  Filled 2012-10-18 (×6): qty 1

## 2012-10-18 MED ORDER — NEOSTIGMINE METHYLSULFATE 1 MG/ML IJ SOLN
INTRAMUSCULAR | Status: DC | PRN
  Administered 2012-10-18: 3 mg via INTRAVENOUS

## 2012-10-18 MED ORDER — DEXTROSE 5 % IV SOLN
1.5000 g | Freq: Once | INTRAVENOUS | Status: AC
  Administered 2012-10-19: 1.5 g via INTRAVENOUS
  Filled 2012-10-18: qty 1.5

## 2012-10-18 MED ORDER — HYDRALAZINE HCL 20 MG/ML IJ SOLN
10.0000 mg | INTRAMUSCULAR | Status: DC | PRN
  Filled 2012-10-18: qty 0.5

## 2012-10-18 MED ORDER — THROMBIN 20000 UNITS EX SOLR
CUTANEOUS | Status: AC
  Filled 2012-10-18: qty 20000

## 2012-10-18 MED ORDER — LABETALOL HCL 5 MG/ML IV SOLN
10.0000 mg | INTRAVENOUS | Status: DC | PRN
  Filled 2012-10-18: qty 4

## 2012-10-18 MED ORDER — ROCURONIUM BROMIDE 100 MG/10ML IV SOLN
INTRAVENOUS | Status: DC | PRN
  Administered 2012-10-18: 10 mg via INTRAVENOUS
  Administered 2012-10-18: 20 mg via INTRAVENOUS
  Administered 2012-10-18: 30 mg via INTRAVENOUS
  Administered 2012-10-18 (×3): 20 mg via INTRAVENOUS
  Administered 2012-10-18: 10 mg via INTRAVENOUS

## 2012-10-18 MED ORDER — ASPIRIN 325 MG PO TABS
325.0000 mg | ORAL_TABLET | Freq: Every day | ORAL | Status: DC
  Administered 2012-10-18 – 2012-10-23 (×6): 325 mg via ORAL
  Filled 2012-10-18 (×7): qty 1

## 2012-10-18 MED ORDER — SODIUM CHLORIDE 0.9 % IV SOLN: 500.0000 mL | Freq: Once | INTRAVENOUS | Status: AC | PRN

## 2012-10-18 MED ORDER — POTASSIUM CHLORIDE CRYS ER 20 MEQ PO TBCR: 20.0000 meq | EXTENDED_RELEASE_TABLET | Freq: Once | ORAL | Status: AC | PRN

## 2012-10-18 MED ORDER — 0.9 % SODIUM CHLORIDE (POUR BTL) OPTIME
TOPICAL | Status: DC | PRN
  Administered 2012-10-18: 3000 mL

## 2012-10-18 SURGICAL SUPPLY — 87 items
BANDAGE ELASTIC 4 VELCRO ST LF (GAUZE/BANDAGES/DRESSINGS) IMPLANT
BANDAGE ESMARK 6X9 LF (GAUZE/BANDAGES/DRESSINGS) ×1 IMPLANT
BNDG ESMARK 6X9 LF (GAUZE/BANDAGES/DRESSINGS) ×2
CANISTER SUCTION 2500CC (MISCELLANEOUS) ×2 IMPLANT
CATH EMB 3FR 40CM (CATHETERS) ×2 IMPLANT
CLIP TI MEDIUM 24 (CLIP) ×2 IMPLANT
CLIP TI WIDE RED SMALL 24 (CLIP) ×4 IMPLANT
CLOTH BEACON ORANGE TIMEOUT ST (SAFETY) ×2 IMPLANT
COVER PROBE W GEL 5X96 (DRAPES) ×2 IMPLANT
COVER SURGICAL LIGHT HANDLE (MISCELLANEOUS) ×2 IMPLANT
CUFF TOURNIQUET SINGLE 24IN (TOURNIQUET CUFF) IMPLANT
CUFF TOURNIQUET SINGLE 34IN LL (TOURNIQUET CUFF) ×2 IMPLANT
CUFF TOURNIQUET SINGLE 44IN (TOURNIQUET CUFF) IMPLANT
DERMABOND ADVANCED (GAUZE/BANDAGES/DRESSINGS) ×2
DERMABOND ADVANCED .7 DNX12 (GAUZE/BANDAGES/DRESSINGS) ×2 IMPLANT
DRAIN CHANNEL 15F RND FF W/TCR (WOUND CARE) IMPLANT
DRAPE C-ARM 42X72 X-RAY (DRAPES) IMPLANT
DRAPE WARM FLUID 44X44 (DRAPE) ×2 IMPLANT
DRSG COVADERM 4X10 (GAUZE/BANDAGES/DRESSINGS) ×2 IMPLANT
DRSG COVADERM 4X14 (GAUZE/BANDAGES/DRESSINGS) ×2 IMPLANT
DRSG COVADERM 4X6 (GAUZE/BANDAGES/DRESSINGS) ×2 IMPLANT
DRSG COVADERM 4X8 (GAUZE/BANDAGES/DRESSINGS) ×2 IMPLANT
ELECT REM PT RETURN 9FT ADLT (ELECTROSURGICAL) ×2
ELECTRODE REM PT RTRN 9FT ADLT (ELECTROSURGICAL) ×1 IMPLANT
EVACUATOR SILICONE 100CC (DRAIN) IMPLANT
GAUZE SPONGE 4X4 16PLY XRAY LF (GAUZE/BANDAGES/DRESSINGS) ×4 IMPLANT
GLOVE BIO SURGEON STRL SZ 6.5 (GLOVE) ×2 IMPLANT
GLOVE BIO SURGEON STRL SZ7 (GLOVE) ×4 IMPLANT
GLOVE BIO SURGEON STRL SZ7.5 (GLOVE) ×2 IMPLANT
GLOVE BIOGEL PI IND STRL 6.5 (GLOVE) ×4 IMPLANT
GLOVE BIOGEL PI IND STRL 7.0 (GLOVE) ×2 IMPLANT
GLOVE BIOGEL PI IND STRL 7.5 (GLOVE) ×2 IMPLANT
GLOVE BIOGEL PI IND STRL 8 (GLOVE) ×1 IMPLANT
GLOVE BIOGEL PI INDICATOR 6.5 (GLOVE) ×4
GLOVE BIOGEL PI INDICATOR 7.0 (GLOVE) ×2
GLOVE BIOGEL PI INDICATOR 7.5 (GLOVE) ×2
GLOVE BIOGEL PI INDICATOR 8 (GLOVE) ×1
GLOVE ECLIPSE 6.0 STRL STRAW (GLOVE) ×2 IMPLANT
GLOVE ECLIPSE 7.0 STRL STRAW (GLOVE) ×2 IMPLANT
GLOVE ECLIPSE 7.5 STRL STRAW (GLOVE) ×6 IMPLANT
GOWN PREVENTION PLUS XLARGE (GOWN DISPOSABLE) ×6 IMPLANT
GOWN STRL NON-REIN LRG LVL3 (GOWN DISPOSABLE) ×8 IMPLANT
INSERT FOGARTY SM (MISCELLANEOUS) IMPLANT
KIT BASIN OR (CUSTOM PROCEDURE TRAY) ×2 IMPLANT
KIT ROOM TURNOVER OR (KITS) ×2 IMPLANT
LOOP VESSEL MAXI BLUE (MISCELLANEOUS) ×2 IMPLANT
MARKER GRAFT CORONARY BYPASS (MISCELLANEOUS) IMPLANT
MARKER SKIN DUAL TIP RULER LAB (MISCELLANEOUS) ×2 IMPLANT
NS IRRIG 1000ML POUR BTL (IV SOLUTION) ×6 IMPLANT
PACK PERIPHERAL VASCULAR (CUSTOM PROCEDURE TRAY) ×2 IMPLANT
PAD ARMBOARD 7.5X6 YLW CONV (MISCELLANEOUS) ×4 IMPLANT
PAD CAST 4YDX4 CTTN HI CHSV (CAST SUPPLIES) ×1 IMPLANT
PADDING CAST COTTON 4X4 STRL (CAST SUPPLIES) ×1
PADDING CAST COTTON 6X4 STRL (CAST SUPPLIES) IMPLANT
SPONGE GAUZE 4X4 12PLY (GAUZE/BANDAGES/DRESSINGS) ×2 IMPLANT
SPONGE LAP 18X18 X RAY DECT (DISPOSABLE) ×2 IMPLANT
SPONGE SURGIFOAM ABS GEL 100 (HEMOSTASIS) IMPLANT
STAPLER VISISTAT 35W (STAPLE) ×4 IMPLANT
STOPCOCK 4 WAY LG BORE MALE ST (IV SETS) ×2 IMPLANT
SUT ETHIBOND 5 LR DA (SUTURE) ×2 IMPLANT
SUT ETHILON 3 0 PS 1 (SUTURE) IMPLANT
SUT GORETEX 5 0 TT13 24 (SUTURE) IMPLANT
SUT GORETEX 6.0 TT13 (SUTURE) IMPLANT
SUT MNCRL AB 4-0 PS2 18 (SUTURE) ×6 IMPLANT
SUT PROLENE 5 0 C 1 24 (SUTURE) ×6 IMPLANT
SUT PROLENE 6 0 BV (SUTURE) ×2 IMPLANT
SUT PROLENE 7 0 BV 1 (SUTURE) ×2 IMPLANT
SUT PROLENE 7 0 BV1 MDA (SUTURE) ×2 IMPLANT
SUT SILK 2 0 (SUTURE) ×1
SUT SILK 2 0 FS (SUTURE) ×4 IMPLANT
SUT SILK 2-0 18XBRD TIE 12 (SUTURE) ×1 IMPLANT
SUT SILK 3 0 (SUTURE) ×2
SUT SILK 3-0 18XBRD TIE 12 (SUTURE) ×2 IMPLANT
SUT SILK 4 0 (SUTURE) ×1
SUT SILK 4-0 18XBRD TIE 12 (SUTURE) ×1 IMPLANT
SUT VIC AB 2-0 CT1 27 (SUTURE) ×2
SUT VIC AB 2-0 CT1 TAPERPNT 27 (SUTURE) ×2 IMPLANT
SUT VIC AB 3-0 SH 27 (SUTURE) ×5
SUT VIC AB 3-0 SH 27X BRD (SUTURE) ×5 IMPLANT
SYR TB 1ML LUER SLIP (SYRINGE) ×2 IMPLANT
TOWEL OR 17X24 6PK STRL BLUE (TOWEL DISPOSABLE) ×4 IMPLANT
TOWEL OR 17X26 10 PK STRL BLUE (TOWEL DISPOSABLE) ×4 IMPLANT
TRAY FOLEY CATH 14FRSI W/METER (CATHETERS) ×2 IMPLANT
TUBING EXTENTION W/L.L. (IV SETS) IMPLANT
UNDERPAD 30X30 INCONTINENT (UNDERPADS AND DIAPERS) ×2 IMPLANT
WATER STERILE IRR 1000ML POUR (IV SOLUTION) ×2 IMPLANT
WIRE MICRO SET SILHO 5FR 7 (SHEATH) IMPLANT

## 2012-10-18 NOTE — Anesthesia Postprocedure Evaluation (Signed)
  Anesthesia Post-op Note  Patient: Tim Kirby  Procedure(s) Performed: Procedure(s) (LRB) with comments: BYPASS GRAFT FEMORAL-POPLITEAL ARTERY (Right) - Right Popliteal Aneurysm Exclusion; Ultrasound guided  Patient Location: PACU  Anesthesia Type:General  Level of Consciousness: awake  Airway and Oxygen Therapy: Patient Spontanous Breathing  Post-op Pain: mild  Post-op Assessment: Post-op Vital signs reviewed  Post-op Vital Signs: Reviewed  Complications: No apparent anesthesia complications

## 2012-10-18 NOTE — Interval H&P Note (Signed)
[facility] [place]  History and Physical Update  The patient was interviewed and re-examined.  The patient's previous History and Physical has been reviewed and is unchanged from my previous consults except interval CTA Abd/Pelvis with runoff.  This CTA demonstrates severe calcification of the tibioperoneal trunk, which might make a bypass possible.  There is no change in the plan of care: Right femoral to distal popliteal bypass, exclusion of femoropopliteal arterial aneurysm.  The risk, benefits, and alternative for bypass operations were discussed with the patient.  The patient is aware the risks include but are not limited to: bleeding, infection, myocardial infarction, stroke, limb loss, nerve damage, need for additional procedures in the future, wound complications, and inability to complete the bypass.  The patient is aware of these risks and agreed to proceed.  Leonides Sake, MD [facility] [place] Office: [phone] Pager: [phone]  10/18/2012, 7:35 AM

## 2012-10-18 NOTE — Anesthesia Procedure Notes (Signed)
Procedure Name: Intubation Date/Time: 10/18/2012 7:47 AM Performed by: Tyrone Nine Pre-anesthesia Checklist: Patient identified, Timeout performed, Emergency Drugs available, Suction available and Patient being monitored Patient Re-evaluated:Patient Re-evaluated prior to inductionOxygen Delivery Method: Circle system utilized Preoxygenation: Pre-oxygenation with 100% oxygen Intubation Type: IV induction Ventilation: Mask ventilation without difficulty Tube size: 8.0 mm Number of attempts: 1 Airway Equipment and Method: Video-laryngoscopy and Stylet Placement Confirmation: positive ETCO2,  ETT inserted through vocal cords under direct vision and breath sounds checked- equal and bilateral Secured at: 23 cm Tube secured with: Tape Dental Injury: Teeth and Oropharynx as per pre-operative assessment  Difficulty Due To: Difficulty was anticipated, Difficult Airway- due to immobile epiglottis and Difficult Airway- due to anterior larynx

## 2012-10-18 NOTE — Consult Note (Signed)
[facility] Renal Consultation Note  Indication for Consultation:  Management of ESRD/hemodialysis; anemia, hypertension/volume and secondary hyperparathyroidism  HPI: Tim Kirby is a 72 y.o. admitted by Dr. Imogene Burn Post op Right  Fem. Pop. Bypass with Ligation of Femoropopliteal aneurysm. Noted per Dr. Imogene Burn" He presented with chief complaint: large pulsation in his R thigh began, but over the last few weeks he notice a large pulse in right thigh."  Currently PosOp and lethargic and stable in  Recovery room. His history per Dr. Imogene Burn notes  was evaluated with a MRI which demonstrated a large aneurysm in the SFA/AK popliteal artery. He denies any claudication or rest pain. He denies any gangrene or foot ulcers. His atherosclerotic risk factors include: HTN, hyperlipidemia, and former smoker. No reported out pt. Hemodialysis problems at [facility].       Past Medical History  Diagnosis Date  . Hyperlipidemia   . Gout   . Secondary hyperparathyroidism   . Anemia   . Colon, diverticulosis   . Thyroid nodule   . Anginal pain     sees Dr. Riley Kill  . Hypertension     sees Dr. Lajuana Carry  . Chronic kidney disease     Tues, thurs, sat  . Aneurysm artery, popliteal     Past Surgical History  Procedure Date  . Knee surgery     Left cyst removal by Dr. Ophelia Charter  . Av fistula placement     left UA AVF      Family History  Problem Relation Age of Onset  . Hypertension Mother   . Diabetes Mother   Social= Lives  With Wife and    reports that he quit smoking about 5 years ago. His smoking use included Pipe. He has never used smokeless tobacco. He reports that he does not drink alcohol or use illicit drugs.  No Known Allergies  Prior to Admission medications   Medication Sig Start Date End Date Taking? Authorizing Provider  amLODipine (NORVASC) 10 MG tablet Take 10 mg by mouth daily.   Yes Historical Provider, MD  aspirin 325 MG tablet Take 325 mg by mouth daily.    Yes Historical Provider, MD  b complex-vitamin c-folic acid (NEPHRO-VITE) 0.8 MG TABS Take 0.8 mg by mouth at bedtime.   Yes Historical Provider, MD  sevelamer (RENVELA) 800 MG tablet Take 3,200 mg by mouth 3 (three) times daily with meals.    Yes Historical Provider, MD  simvastatin (ZOCOR) 40 MG tablet Take 40 mg by mouth every evening.   Yes Historical Provider, MD     Anti-infectives     Start     Dose/Rate Route Frequency Ordered Stop   10/17/12 0936   cefUROXime (ZINACEF) 1.5 g in dextrose 5 % 50 mL IVPB        1.5 g 100 mL/hr over 30 Minutes Intravenous 30 min pre-op 10/17/12 0936 10/18/12 0751          Results for orders placed during the hospital encounter of 10/18/12 (from the past 48 hour(s))  POCT I-STAT 4, (NA,K, GLUC, HGB,HCT)     Status: Abnormal   Collection Time   10/18/12  6:17 AM      Component Value Range Comment   Sodium 136  135 - 145 mEq/L    Potassium 4.3  3.5 - 5.1 mEq/L    Glucose, Bld 92  70 - 99 mg/dL    HCT 95.2 (*) 84.1 - 52.0 %    Hemoglobin 11.2 (*)  13.0 - 17.0 g/dL      ROS: Unable to obtain any history in recovery room . With pt post op  Lethargy.   Physical Exam: Filed Vitals:   10/18/12 1415  BP: 134/71  Pulse:   Temp:   Resp:      General: Adult BM , NAD , Lethargic in Recovery Room post op. Opens eyes to voice, minimal voice responses cutrrently HEENT: [place].MMM Eyes:  EOMI Neck: no jvd  Heart:  RRR, no rub or Murmur Lungs:  Faint rhonchi bilat.  Abdomen: Bs +=, soft nontender Extremities:  No pedal edema, Right  Foot cool to touch with Right popliteal and Dorsalis Ped. Pulses per Doppler/   Skin: no rash or Ulcers Neuro:  lethargic   In recovery room  Post op/ opening eyes  to voice Dialysis Access:  Pos. Bruit  Left FA AVF  Dialysis Orders: Center:[facility]  on TTS . EDW 82.0 kg HD Bath 2.0 K. 2.0 Ca  Time 4hrs Heparin none sec to surgery. Access Left fa  AVF BFR 450 DFR 800    Zemplar 6 mcg IV/HD ( ipth 562 on 08/26/12)  Epogen 1200   Units IV/HD  Venofer  0  Other 0  Assessment/Plan 1. Right Femoral / Popliteal bypass grafting and Ligation of Femoropopliteal Aneurysm= per Dr. Imogene Burn 2. ESRD - continue TTS ([facility]) schedule , no heparin hd, HD tomorrow 3. Hypertension/volume  - post op bp 137/73 / on Amlodipine 10mg  q hs( op med)/ last cxr 10/13/12 no excess vol./ need to stop iv fluids on this hd pt. 4. Anemia  - hgb   11.2  Low dose Aranesp, no iron 5. Metabolic bone disease -  Ca  11.3 hold vit d  Fu am ca and phos  Use 2.25 ca bath , Renvela binder 6. Thyroid Nodule  Folded by repeat ct scans Aug 2014 7. 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  Vinson Moselle  MD [employer] [phone] pgr    [phone] cell 10/18/2012, 6:10 PM

## 2012-10-18 NOTE — Op Note (Signed)
OPERATIVE NOTE   PROCEDURE: 1. Right common femoral artery to below-the-knee popliteal bypass with non-reverse saphenous vein 2. Ligation of femoropopliteal aneurysm  PRE-OPERATIVE DIAGNOSIS: Right femoropopliteal aneurysm  POST-OPERATIVE DIAGNOSIS: same as above   SURGEON: Leonides Sake, MD  ASSISTANT(S): Cari Caraway, MD; Della Goo, [facility]   ANESTHESIA: general  ESTIMATED BLOOD LOSS: 250 cc  FINDING(S): 1. Severe calcification in superficial femoral artery and tibioperoneal trunk  2. Moderately calcified ectatic popliteal artery 3. Palpable tibioperoneal trunk pulse with weak peroneal signal  SPECIMEN(S):  none  INDICATIONS:   Tim Kirby is a 72 y.o. male who presents with large bilateral iliofemoropopliteal aneurysms.  Surprisingly the patient was asymptomatic even with severe peripheral arterial disease.  He underwent an aortogram and bilateral leg runoff and CTA abdomen and pelvis with runoff to evaluate for aneurysms at multiple levels.  Based on these studies while the patient has ectatic iliac arteries, neither is large enough to require immediate intervention.  Likewise bilateral common femoral arteries were not large enough to intervene upon.  Unfortunately the patient has a ~10 cm popliteal aneurysm with concomittant ectatic right popliteal artery with compromised peroneal runoff.  I discussed with the patient proceeding with intervention on this side first, as it was at risk for occlusion and subsequent need for right above-knee amputation.  The risk, benefits, and alternative for bypass operations were discussed with the patient.  The patient is aware the risks include but are not limited to: bleeding, infection, myocardial infarction, stroke, limb loss, nerve damage, need for additional procedures in the future, wound complications, and inability to complete the bypass.  I reiterated to the patient that he is a very high risk of losing his right leg.   He is also aware  that completing the procedure may not be possible.  The patient is aware of these risks and agreed to proceed.  DESCRIPTION: After obtain full informed written consent, the patient was brought back to the operating room and placed supine upon the operating table.  The patient received IV antibiotics prior to induction.  After obtaining adequate anesthesia, the patient was prepped and draped in the standard fashion for: right femoropopliteal bypass.  First, I externally rotated the right knee and placed a bump here.  I made an incision one finger breath posterior to the tibia. In the process, I found the right greater saphenous vein at this level.  I dissected this vein posteriorly.  I then dissected through the subcutaneous tissue and fascia with electrocautery.  I took down the soleal muscle to better expose the tibioperoneal trunk.  I dissected out the popliteal vein first, retracting it more medially.  I then dissected out the below-the-knee popliteal artery.  The artery was ectatic and partially calcified.  The proximal tibioperoneal trunk was not compressible so it was not a viable target.  I dissected out the below-the-knee popliteal artery distally, and eventually I found a possible soft area for an end-to-end anastomosis.  I had Dr. Edilia Bo scrub in to give his opinion.  He felt it was an acceptable distal target also, so I elected to proceed with the procedure.  Note, as this popliteal segment is ectatic, I did not think a endarterectomy would have been safe, as the wall of this segment is already compromised by some degree of aneurysmal disease.    I then turned my attention to the right groin.  I made a longitudinal incision over the right common femoral artery.  Using electrocautery, I dissected out the  right common femoral artery up to the distal external iliac artery and down to the femoral bifurcation.  I was able to dissect out the proximal superficial femoral artery and profunda artery.  The  common femoral artery was aneurysmal with some anterior calcification.  The superficial femoral artery was heavily calcified.  I started dissecting out the saphenofemoral junction, in the process tying off side-branches.  I then started skip incisions over the greater saphenous vein, requiring a total of 3 skip incisions.  Through these incisions I was able to harvest the right greater saphenous vein from the groin down to mid-calf.  In this process, I tied off side branches with silk ties and repairing the right greater saphenous vein with a 7-0 Prolene.  Eventually, I felt I had adequate length.  I clamped the greater saphenous vein distal and transected the vein.  The distal vein was ligated with 2-0 Silk.  The saphenofemoral junction was clamped and the vein conduit transected.  I oversewed the saphenofemoral junction with a double layer of 5-0 Prolene.    At this point, I turned by attention to the vein conduit.  I placed the vessel cannula into the vein in reversed position and secured it with a silk tie.  I hydrodistended the vein conduit and repaired any leaking side branches.  I soaked this vein conduit in heparinized saline.  I turned by attention to the right groin.  The patient was given 6000 units of Heparin intravenously, which was a therapeutic bolus.  In total, 8000 units of Heparin was administrated to achieve and maintain a therapeutic level of anticoagulation.  After 3 minutes, I ligated the right proximal superficial femoral artery with two 0 Ethibond ties,  I then clamped the right profunda femoral artery and right proximal common femoral artery.  I made an arteriotomy anteriorly with an 11-blade.  I extended the arteriotomy with a Potts scissor.  I spatulated the proximal end of the vein conduit in a non-reversed configuration.  I lyzed several valves also.  The vein conduit was sewn to the right common femoral artery with a running stitch of 5-0 Prolene.  Prior to completing the anastomosis,  I backbled the profunda femoral artery and common femoral artery.  I then released the clamps.  Immediate there was pulse in the proximal conduit.  I made two passed with the valvutome to lyze the valves in this vein conduit.   I clamped the distal end of the conduit and marked the anterior surface of the bypass while distended.  I checked the blood flow in the graft by unclamping it: pulsatile bleeding was present.  I clamped the vein conduit just distal to its anastomosis and washed out the conduit with heparinized saline.    I turned my attention to the popliteal exposure.  Even with palpation between the femoral condyles, I could feel the very large right popliteal aneurysm, so I did not think anatomical routing of this bypass was possible.  Subsequently, I used a Gore metal tunneler to dissect medial to the knee and anteriorly in the right leg subcutaneous tissue to get the right common femoral artery.  I tied the vein conduit to the inner cannula and delivered the conduit, taking care to maintain orientation.  I pulled out the metal tunnel, leaving the vein conduit in place.  I carely pulled the vein conduit to adequate tension.  At this point, I placed a sterile tourniquet on the right leg.  I exsanguinated the right leg with a  Esmark bandage and inflated the tourniquet to 250 mm Hg.  I then reset my popliteal exposure and tied off the proximal below-the-knee popliteal artery with two 2-0 Silk ties.  In such fashion, I able exclude the aneurysm femoropopliteal segment in the right leg.  I then transect the popliteal artery at the distal 1/3 of its length in this popliteal space, where it was only partial calcified.  There was backbleeding still from the tibioperoneal trunk/distal popliteal artery.  The tibioperoneal trunk would not pass a 2 mm dilator or a 3 Fogarty balloon.  I spatulated the distal popliteal artery sharply with some difficulty to facilitate an end-to-end anastomosis.  I also spatulated the  distal vein graft, adjusting length in the process.  I sewed the vein graft to the distal popliteal artery with difficulty using a 5-0 Prolene in a continuous fashion.  Suboptimal bites were necessary due to the significant calcification in this artery.  Prior to completing this anastomosis, I dropped the tourniquet and allowed the graft to bleed in an antegrade fashion.  The tibioperoneal trunk and distal popliteal artery continued to backbleed throughout sewing this anastomosis.  I completed the anastomosis in the usual fashion.  I release any adjacent tissue to the vein graft along the medial approach to avoid any compression of the graft.  I gave the patient 50 mg of Protamine to reverse the anticoagulation.  I placed thrombin and gelfoam in all wounds.  I washed out all wounds.  Bleeding points were controlled with electrocautery.  I closed the right groin with a double layer of 2-0 Vicryl immediately over the bypass graft.  The subcutaneous tissue was reapproximated with a double layer of 3-0 Vicryl.  The skin was reapproximated with a running subcuticular stitch of 4-0 Monocryl.  The skin was cleaned, dried, and reinforced with Dermabond.  The vein harvest incisions were closed with interrupted 2-0 Vicryl sutures to closed the deep subcutaneous tissue and running 3-0 Vicryl sutures in the subcutaneous tissue.  The skin was reapproximated with staples.  The popliteal exposure was closed by reapproximating the soleus and gastrocnemius muscle to their prior attachments, making certain the bypass graft was not compromised.  The subcutaneous tissue was reapproximated with a 3-0 Vicryl.  The skin was reapproximated with a running subcuticular stitch of 4-0 Monocryl.  The skin was cleaned, dried, and reinforced with Dermabond.    There was a strongly palpable graft pulse in the medial distal thigh and doppler peroneal signal.     COMPLICATIONS: none  CONDITION: stable  Leonides Sake, MD [facility] [place] Office: [phone] Pager: [phone]  10/18/2012, 1:40 PM

## 2012-10-18 NOTE — Preoperative (Signed)
Beta Blockers   Reason not to administer Beta Blockers:Not Applicable 

## 2012-10-18 NOTE — H&P (View-Only) (Signed)
[facility] [place]  Postoperative Visit  History of Present Illness  AC COLAN is a 72 y.o. male who presents for postoperative follow-up from procedure on Date: 09/27/12: Aortogram, bilateral leg runoff.  The patient denies any rest pain or claudication.  He denies any ulcers or gangrene in either foot.  The patient is in the process of cardiac work-up with Dr. Riley Kill.  He denies any current chest pain or pressure.  Past Medical History  Diagnosis Date  . Chronic kidney disease   . Hypertension   . Hyperlipidemia   . Gout   . Secondary hyperparathyroidism   . Anemia   . Colon, diverticulosis   . Thyroid nodule     Past Surgical History  Procedure Date  . Knee surgery     Left cyst removal by Dr. Ophelia Charter  . Av fistula placement     left UA AVF    History   Social History  . Marital Status: Married    Spouse Name: N/A    Number of Children: N/A  . Years of Education: N/A   Occupational History  . Not on file.   Social History Main Topics  . Smoking status: Former Smoker    Types: Pipe    Quit date: 09/18/2007  . Smokeless tobacco: Never Used  . Alcohol Use: No  . Drug Use: No  . Sexually Active: Not on file   Other Topics Concern  . Not on file   Social History Narrative  . No narrative on file    Family History  Problem Relation Age of Onset  . Hypertension Mother   . Diabetes Mother     Current Outpatient Prescriptions on File Prior to Visit  Medication Sig Dispense Refill  . amLODipine (NORVASC) 10 MG tablet Take 10 mg by mouth daily.      Marland Kitchen aspirin 325 MG tablet Take 325 mg by mouth daily.      Marland Kitchen b complex-vitamin c-folic acid (NEPHRO-VITE) 0.8 MG TABS Take 0.8 mg by mouth at bedtime.      . sevelamer (RENVELA) 800 MG tablet Take 3,200 mg by mouth 3 (three) times daily with meals.       . simvastatin (ZOCOR) 40 MG tablet Take 40 mg by mouth every evening.        No Known Allergies  REVIEW OF SYSTEMS: (Positives  checked otherwise negative)  CARDIOVASCULAR: [ ]  chest pain, [ ]  chest pressure, [ ]  palpitations, [ ]  shortness of breath when laying flat, [ ]  shortness of breath with exertion, [ ]  pain in feet when laying flat, [ ]  history of blood clot in veins (DVT), [ ]  history of phlebitis, [ ]  swelling in legs, [ ]  varicose veins  PULMONARY: [ ]  productive cough, [ ]  asthma, [ ]  wheezing  NEUROLOGIC: [ ]  weakness in arms or legs, [ ]  numbness in arms or legs, [ ]  difficulty speaking or slurred speech, [ ]  temporary loss of vision in one eye, [ ]  dizziness  HEMATOLOGIC: [ ]  bleeding problems, [ ]  problems with blood clotting too easily  MUSCULOSKEL: [ ]  joint pain, [ ]  joint swelling  GASTROINTEST: [ ]  Vomiting blood, [ ]  Blood in stool  GENITOURINARY: [ ]  Burning with urination, [ ]  Blood in urine  PSYCHIATRIC: [ ]  history of major depression  INTEGUMENTARY: [ ]  rashes, [ ]  ulcers  CONSTITUTIONAL: [ ]  fever, [ ]  chills   Physical Examination  Filed Vitals:   10/01/12 1340  BP: 147/88  Pulse: 78  Resp: 18  Height: 5\' 8"  (1.727 m)  Weight: 186 lb (84.369 kg)  SpO2: 98%   Body mass index is 28.28 kg/(m^2).  General: A&O x 3, WDWN  Pulmonary: Sym exp, good air movt, CTAB, no rales, rhonchi, & wheezing  Cardiac: RRR, Nl S1, S2, no Murmurs, rubs or gallops  Vascular: Vessel Right Left  Radial Palpable Palpable  Ulnar Palpable Palpable  Brachial Palpable Palpable  Carotid Palpable, without bruit Palpable, without bruit  Aorta Not palpable N/A  Femoral Palpable Palpable  Popliteal Not palpable Not palpable  PT Not Palpable Not Palpable  DP Not Palpable Not Palpable    Gastrointestinal: soft, NTND, -G/R, - HSM, - masses, - CVAT B  Musculoskeletal: M/S 5/5 throughout , Extremities without ischemic changes , left groin without hematoma, no echymosis present at cannulation site  Neurologic:  Pain and light touch intact in extremities , Motor exam as listed above  BLE GSV Mapping (Date:  10/01/12)  R: GSV adequate with possible thickening in calf  L: GSV adequate throughout  Medical Decision Making  ABRAN GAVIGAN is a 72 y.o. 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   The R leg appears to be at most risk on angiogram.  Essentially he has no distal target in the right leg and functionally an fem-BK pop bypass is a bypass to a popliteal island. The risk, benefits, and alternative for bypass operations were discussed with the patient.  The patient is aware the risks include but are not limited to: bleeding, infection, myocardial infarction, stroke, limb loss, nerve damage, need for additional procedures in the future, wound complications, and inability to complete the bypass.   I clearly reiterated that he is a risk of limb loss bilaterally and that the bypass on the right was at risk of failure due to poor outflow status. The patient is aware of these risks and agreed to proceed on 13 JAN 14. This should also give Dr. Riley Kill time to complete any additional cardiac risk stratification as needed. I discussed in depth with the patient the nature of atherosclerosis, and emphasized the importance of maximal medical management including strict control of blood pressure, blood glucose, and lipid levels, obtaining regular exercise, and cessation of smoking.  The patient is aware that without maximal medical management the underlying atherosclerotic disease process will progress, limiting the benefit of any interventions.  Thank you for allowing [place] to participate in this patient's care.  Leonides Sake, MD [facility] [place] Office: [phone] Pager: [phone]

## 2012-10-18 NOTE — Transfer of Care (Signed)
Immediate Anesthesia Transfer of Care Note  Patient: Tim Kirby  Procedure(s) Performed: Procedure(s) (LRB) with comments: BYPASS GRAFT FEMORAL-POPLITEAL ARTERY (Right) - Right Popliteal Aneurysm Exclusion; Ultrasound guided  Patient Location: PACU  Anesthesia Type:General  Level of Consciousness: sedated and patient cooperative  Airway & Oxygen Therapy: Patient Spontanous Breathing and Patient connected to nasal cannula oxygen  Post-op Assessment: Report given to PACU RN and Post -op Vital signs reviewed and stable  Post vital signs: Reviewed and stable  Complications: No apparent anesthesia complications

## 2012-10-18 NOTE — Anesthesia Preprocedure Evaluation (Addendum)
Anesthesia Evaluation  Patient identified by MRN, date of birth, ID band Patient awake    Reviewed: Allergy & Precautions, H&P , NPO status , Patient's Chart, lab work & pertinent test results  Airway Mallampati: III TM Distance: <3 FB   Mouth opening: Limited Mouth Opening  Dental  (+) Teeth Intact and Dental Advisory Given,    Pulmonary neg pulmonary ROS,    Pulmonary exam normal       Cardiovascular Exercise Tolerance: Poor hypertension, Pt. on medications + angina with exertion + Peripheral Vascular Disease Rhythm:Regular Rate:Normal  ------------------------------------------------------------ Study Conclusions ECHO  10-13-12 - Left ventricle: The cavity size was normal. Wall thickness   was increased in a pattern of moderate LVH. Systolic   function was normal. The estimated ejection fraction was   in the range of 60% to 65%. - Left atrium: The atrium was moderately dilated.  Right Popliteal Aneurysm Echocardiography.  M-mode, complete 2D, spectral Doppler, and color Doppler.  Height:  Height: 172.7cm. Height: 68in. Weight:  Weight: 83.9kg. Weight: 184.6lb.  Body mass index: BMI: 28.1kg/m^2.  Body surface area:    BSA: 1.35m^2.  Blood pressure:     143/72.  Patient status:  Outpatient. Location:  [facility]     Neuro/Psych Hard of Hearing negative neurological ROS     GI/Hepatic negative GI ROS, Neg liver ROS,   Endo/Other  negative endocrine ROS  Renal/GU ESRF and DialysisRenal diseasenegative Renal ROSDialysis this past Saturday w/o probs.  Fistula Left arm with good thrill.     Musculoskeletal  (+) Arthritis -, Osteoarthritis,    Abdominal   Peds  Hematology negative hematology ROS (+)   Anesthesia Other Findings Overbite. Neck mobility limited  Reproductive/Obstetrics                      Anesthesia Physical Anesthesia Plan  ASA: III  Anesthesia Plan: General    Post-op Pain Management:    Induction: Intravenous  Airway Management Planned: Oral ETT  Additional Equipment:   Intra-op Plan:   Post-operative Plan:   Informed Consent: I have reviewed the patients History and Physical, chart, labs and discussed the procedure including the risks, benefits and alternatives for the proposed anesthesia with the patient or authorized representative who has indicated his/her understanding and acceptance.   Dental advisory given  Plan Discussed with: CRNA and Anesthesiologist  Anesthesia Plan Comments:         Anesthesia Quick Evaluation

## 2012-10-18 NOTE — Progress Notes (Signed)
Call to dr. Randa Evens for sign out. She gave permission to take pt to room/

## 2012-10-18 NOTE — Assessment & Plan Note (Signed)
Nephrology should follow the patient while hospitalized.

## 2012-10-19 ENCOUNTER — Encounter (HOSPITAL_COMMUNITY): Payer: Self-pay | Admitting: Vascular Surgery

## 2012-10-19 DIAGNOSIS — I70219 Atherosclerosis of native arteries of extremities with intermittent claudication, unspecified extremity: Secondary | ICD-10-CM

## 2012-10-19 LAB — BASIC METABOLIC PANEL
BUN: 52 mg/dL — ABNORMAL HIGH (ref 6–23)
Calcium: 9.7 mg/dL (ref 8.4–10.5)
Creatinine, Ser: 12.04 mg/dL — ABNORMAL HIGH (ref 0.50–1.35)
GFR calc non Af Amer: 4 mL/min — ABNORMAL LOW (ref >90)
Glucose, Bld: 91 mg/dL (ref 70–99)
Potassium: 5.8 mEq/L — ABNORMAL HIGH (ref 3.5–5.1)

## 2012-10-19 LAB — CBC
HCT: 27.7 % — ABNORMAL LOW (ref 39.0–52.0)
Hemoglobin: 9 g/dL — ABNORMAL LOW (ref 13.0–17.0)
MCH: 30.3 pg (ref 26.0–34.0)
MCHC: 32.5 g/dL (ref 30.0–36.0)
MCV: 93.3 fL (ref 78.0–100.0)

## 2012-10-19 MED ORDER — PENTAFLUOROPROP-TETRAFLUOROETH EX AERO: 1.0000 "application " | INHALATION_SPRAY | CUTANEOUS | Status: DC | PRN

## 2012-10-19 MED ORDER — SODIUM CHLORIDE 0.9 % IV SOLN: 100.0000 mL | INTRAVENOUS | Status: DC | PRN

## 2012-10-19 MED ORDER — ZOLPIDEM TARTRATE 5 MG PO TABS: 5.0000 mg | ORAL_TABLET | Freq: Every evening | ORAL | Status: DC | PRN

## 2012-10-19 MED ORDER — PARICALCITOL 5 MCG/ML IV SOLN
INTRAVENOUS | Status: AC
  Administered 2012-10-19: 6 ug via INTRAVENOUS
  Filled 2012-10-19: qty 2

## 2012-10-19 MED ORDER — ALTEPLASE 2 MG IJ SOLR
2.0000 mg | Freq: Once | INTRAMUSCULAR | Status: DC | PRN
  Filled 2012-10-19: qty 2

## 2012-10-19 MED ORDER — CALCIUM CARBONATE 1250 MG/5ML PO SUSP
500.0000 mg | Freq: Four times a day (QID) | ORAL | Status: DC | PRN
  Filled 2012-10-19: qty 5

## 2012-10-19 MED ORDER — DOCUSATE SODIUM 283 MG RE ENEM
1.0000 | ENEMA | RECTAL | Status: DC | PRN
  Filled 2012-10-19: qty 1

## 2012-10-19 MED ORDER — LIDOCAINE HCL (PF) 1 % IJ SOLN: 5.0000 mL | INTRAMUSCULAR | Status: DC | PRN

## 2012-10-19 MED ORDER — ONDANSETRON HCL 4 MG PO TABS: 4.0000 mg | ORAL_TABLET | Freq: Four times a day (QID) | ORAL | Status: DC | PRN

## 2012-10-19 MED ORDER — HYDROXYZINE HCL 25 MG PO TABS
25.0000 mg | ORAL_TABLET | Freq: Three times a day (TID) | ORAL | Status: DC | PRN
  Filled 2012-10-19: qty 1

## 2012-10-19 MED ORDER — NEPRO/CARBSTEADY PO LIQD: 237.0000 mL | ORAL | Status: DC | PRN

## 2012-10-19 MED ORDER — PARICALCITOL 5 MCG/ML IV SOLN
6.0000 ug | INTRAVENOUS | Status: DC
  Administered 2012-10-19: 6 ug via INTRAVENOUS
  Filled 2012-10-19 (×2): qty 1.2

## 2012-10-19 MED ORDER — HEPARIN SODIUM (PORCINE) 1000 UNIT/ML DIALYSIS: 1000.0000 [IU] | INTRAMUSCULAR | Status: DC | PRN

## 2012-10-19 MED ORDER — SORBITOL 70 % SOLN
30.0000 mL | Status: DC | PRN
  Filled 2012-10-19: qty 30

## 2012-10-19 MED ORDER — LIDOCAINE-PRILOCAINE 2.5-2.5 % EX CREA: 1.0000 "application " | TOPICAL_CREAM | CUTANEOUS | Status: DC | PRN

## 2012-10-19 MED ORDER — NEPRO/CARBSTEADY PO LIQD
237.0000 mL | Freq: Three times a day (TID) | ORAL | Status: DC | PRN
  Filled 2012-10-19: qty 237

## 2012-10-19 MED ORDER — CAMPHOR-MENTHOL 0.5-0.5 % EX LOTN: 1.0000 "application " | TOPICAL_LOTION | Freq: Three times a day (TID) | CUTANEOUS | Status: DC | PRN

## 2012-10-19 MED ORDER — ATORVASTATIN CALCIUM 20 MG PO TABS
20.0000 mg | ORAL_TABLET | Freq: Every day | ORAL | Status: DC
  Administered 2012-10-19 – 2012-10-23 (×5): 20 mg via ORAL
  Filled 2012-10-19 (×6): qty 1

## 2012-10-19 MED ORDER — DARBEPOETIN ALFA-POLYSORBATE 60 MCG/0.3ML IJ SOLN
INTRAMUSCULAR | Status: AC
  Administered 2012-10-19: 60 ug via INTRAVENOUS
  Filled 2012-10-19: qty 0.3

## 2012-10-19 NOTE — Procedures (Deleted)
I was present at this dialysis session. I have reviewed the session itself and made appropriate changes.   Vinson Moselle, MD [employer] 10/19/2012, 12:20 PM

## 2012-10-19 NOTE — Progress Notes (Signed)
Subjective:  No cos," foot feels better" Objective Vital signs in last 24 hours: Filed Vitals:   10/19/12 0400 10/19/12 0707 10/19/12 0708 10/19/12 0730  BP: 124/72 133/79    Pulse: 64 66 65   Temp: 98.2 F (36.8 C)   98.2 F (36.8 C)  TempSrc: Oral   Oral  Resp: 16 13 14    Height:      Weight: 85.1 kg (187 lb 9.8 oz)     SpO2: 97% 98% 97%    Weight change:   Intake/Output Summary (Last 24 hours) at 10/19/12 1045 Last data filed at 10/19/12 0900  Gross per 24 hour  Intake   1530 ml  Output    255 ml  Net   1275 ml   Labs: Basic Metabolic Panel:  Lab 10/19/12 2130 10/18/12 1749 10/18/12 0617 10/13/12 1152  NA 133* -- 136 136  K 5.8* -- 4.3 4.5  CL 92* -- -- 89*  CO2 22 -- -- 28  GLUCOSE 91 -- 92 95  BUN 52* -- -- 32*  CREATININE 12.04* 10.62* -- 9.38*  CALCIUM 9.7 -- -- 11.3*  ALB -- -- -- --  PHOS -- -- -- --   Liver Function Tests:  Lab 10/13/12 1152  AST 17  ALT 10  ALKPHOS 74  BILITOT 0.2*  PROT 8.2  ALBUMIN 4.4   No results found for this basename: LIPASE:3,AMYLASE:3 in the last 168 hours No results found for this basename: AMMONIA:3 in the last 168 hours CBC:  Lab 10/19/12 0505 10/18/12 1749 10/18/12 0617 10/13/12 1152  WBC 8.6 13.6* -- 8.4  NEUTROABS -- -- -- --  HGB 9.0* 10.5* 11.2* --  HCT 27.7* 32.3* 33.0* --  MCV 93.3 94.2 -- 93.3  PLT 179 166 -- 241   Cardiac Enzymes: No results found for this basename: CKTOTAL:5,CKMB:5,CKMBINDEX:5,TROPONINI:5 in the last 168 hours CBG: No results found for this basename: GLUCAP:5 in the last 168 hours  Iron Studies: No results found for this basename: IRON,TIBC,TRANSFERRIN,FERRITIN in the last 72 hours Studies/Results: No results found. Medications:    . DOPamine        . amLODipine  10 mg Oral Daily  . aspirin  325 mg Oral Daily  . darbepoetin (ARANESP) injection - DIALYSIS  60 mcg Intravenous Q Tue-HD  . docusate sodium  100 mg Oral Daily  . heparin  5,000 Units Subcutaneous Q8H  .  multivitamin  1 tablet Oral QHS  . pantoprazole  40 mg Oral Daily  . sevelamer  3,200 mg Oral TID WC  . simvastatin  40 mg Oral QPM   I  have reviewed scheduled and prn medications.  Physical Exam:     General: Adult BM , NAD ,Appropriate  Heart: RRR, no rub or Murmur  Lungs: CTA bilaterally   Abdomen: Bs +=, soft nontender  Extremities: No pedal edema, Right Foot warm to touch  Dialysis Access: Pos. Bruit Left FA AVF   Dialysis Orders: Center:[facility] on TTS .  EDW 82.0 kg HD Bath 2.0 K. 2.0 Ca Time 4hrs Heparin none sec to surgery. Access Left fa AVF BFR 450 DFR 800  Zemplar 6 mcg IV/HD ( ipth 562 on 08/26/12) Epogen 1200 Units IV/HD Venofer 0  Other 0   Assessment/Plan  1. 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 Imogene Burn, 10/18/12 2. ESRD - continue TTS ([facility]) schedule , no heparin hd, HD today 3. Hypertension/volume - post op bp 133/79  on  Amlodipine 10mg  q hs( op med)/ last cxr 10/13/12, 3 kg up by weights, UF 3kg as tolerated today with HD 4. Anemia - hgb 11.2 to post op lower level of 9.0 this am  Low dose Aranesp, no iron/ will increase  To higher as op. Fu trend. 5. Metabolic bone disease - Ca 9.7   With normal alb. This ca better continue vit d / Using 2.25 ca bath , Renvela binder/ continue vit d 6. Thyroid Nodule Folded by repeat ct scans Aug 2014 7. 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   Lenny Pastel, PA-C [facility] [phone] 10/19/2012,10:45 AM  LOS: 1 day   Patient seen and examined and agree with assessment and plan as above with additions as indicated.  Vinson Moselle  MD [facility] [phone] pgr    [phone] cell 10/19/2012, 12:26 PM

## 2012-10-19 NOTE — Evaluation (Signed)
Physical Therapy Evaluation Patient Details Name: Tim Kirby MRN: [id number] DOB: 10/28/1940 Today's Date: 10/19/2012 Time: 1191-4782 PT Time Calculation (min): 27 min  PT Assessment / Plan / Recommendation Clinical Impression  Pt is 72 y/o male admitted for s/p right fem pop bypass due to aneurysm.  Pt limited due to pain on evaluation however motivated and willing to work.  Pt will benefit from acute PT services to improve overall mobility to prepare for safe d/c home     PT Assessment  Patient needs continued PT services    Follow Up Recommendations  Home health PT;Supervision/Assistance - 24 hour    Barriers to Discharge None      Equipment Recommendations  Rolling walker with 5" wheels    Recommendations for Other Services     Frequency Min 3X/week    Precautions / Restrictions Precautions Precautions: Fall Restrictions Weight Bearing Restrictions: No   Pertinent Vitals/Pain 4/10 right LE pain with mobility       Mobility  Bed Mobility Bed Mobility: Supine to Sit Supine to Sit: 4: Min assist Details for Bed Mobility Assistance: (A) with right LE OOB Transfers Transfers: Sit to Stand;Stand to Sit Sit to Stand: 4: Min assist;From bed;From toilet Stand to Sit: 4: Min assist;To chair/3-in-1;To toilet Details for Transfer Assistance: (A) to initiate transfer with cues for hand placement and right LE placement Ambulation/Gait Ambulation/Gait Assistance: 4: Min assist Ambulation Distance (Feet): 20 Feet Assistive device: Rolling walker Ambulation/Gait Assistance Details: (A) to maintain balance and max cues for proper RW placement and body position within RW.  Cues for proper step sequence Gait Pattern: Step-to pattern;Decreased step length - left;Decreased stance time - right;Shuffle;Antalgic;Trunk flexed Gait velocity: decrease due to pain Stairs: No Wheelchair Mobility Wheelchair Mobility: No     PT Diagnosis: Difficulty walking;Generalized  weakness;Acute pain  PT Problem List: Decreased strength;Decreased activity tolerance;Decreased balance;Decreased mobility;Decreased coordination;Decreased knowledge of use of DME;Pain PT Treatment Interventions: DME instruction;Gait training;Stair training;Functional mobility training;Therapeutic activities;Therapeutic exercise;Balance training;Patient/family education   PT Goals Acute Rehab PT Goals PT Goal Formulation: With patient Time For Goal Achievement: 10/26/12 Potential to Achieve Goals: Good Pt will go Supine/Side to Sit: with modified independence PT Goal: Supine/Side to Sit - Progress: Goal set today Pt will go Sit to Supine/Side: with modified independence PT Goal: Sit to Supine/Side - Progress: Goal set today Pt will go Sit to Stand: with modified independence PT Goal: Sit to Stand - Progress: Goal set today Pt will go Stand to Sit: with modified independence PT Goal: Stand to Sit - Progress: Goal set today Pt will Ambulate: >150 feet;with modified independence;with rolling walker PT Goal: Ambulate - Progress: Goal set today Pt will Go Up / Down Stairs: 1-2 stairs;with modified independence;with least restrictive assistive device PT Goal: Up/Down Stairs - Progress: Goal set today  Visit Information  Last PT Received On: 10/19/12 Assistance Needed: +1    Subjective Data  Subjective: "I need to use the bathroom now." Patient Stated Goal: To return home and back to work   Prior Functioning  Home Living Lives With: Spouse (mother in law) Available Help at Discharge: Family Type of Home: House Home Access: Stairs to enter Secretary/administrator of Steps: 1 Entrance Stairs-Rails: None Home Layout: One level Bathroom Shower/Tub: Engineer, manufacturing systems: Standard Home Adaptive Equipment: None Prior Function Level of Independence: Independent Able to Take Stairs?: Yes Driving: Yes 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[employer])    Cognition   Overall Cognitive Status: Appears within functional  limits for tasks assessed/performed Arousal/Alertness: Awake/alert Orientation Level: Appears intact for tasks assessed Behavior During Session: [facility] for tasks performed    Extremity/Trunk Assessment Right Lower Extremity Assessment RLE ROM/Strength/Tone: Unable to fully assess;Due to pain RLE Sensation:  (hx PVD) Left Lower Extremity Assessment LLE ROM/Strength/Tone: Within functional levels   Balance    End of Session PT - End of Session Equipment Utilized During Treatment: Gait belt Activity Tolerance: Patient limited by pain Patient left:  (w/c to transfer off step down) Nurse Communication: Mobility status  GP     Keona Bilyeu 10/19/2012, 12:04 PM Jake Shark, PT DPT [phone]

## 2012-10-19 NOTE — Progress Notes (Signed)
Nutrition Brief Note  Patient identified on the Malnutrition Screening Tool (MST) Report for unsure of weight loss.  Per review of usual weights in EMR, patient has not had any significant weight loss in the past year.  PO intake is adequate.   Wt Readings from Last 10 Encounters:  10/19/12 187 lb 9.8 oz (85.1 kg)  10/19/12 187 lb 9.8 oz (85.1 kg)  10/13/12 183 lb 11.2 oz (83.326 kg)  10/13/12 183 lb (83.008 kg)  10/11/12 186 lb (84.369 kg)  10/01/12 186 lb (84.369 kg)  09/27/12 185 lb (83.915 kg)  09/27/12 185 lb (83.915 kg)  09/21/12 183 lb (83.008 kg)  09/17/12 185 lb (83.915 kg)     Body mass index is 30.28 kg/(m^2). Patient meets criteria for obesity, class 1 based on current BMI.   Current diet order is Renal 80/90-2-2, patient is consuming approximately 75-100% of meals at this time. Labs and medications reviewed.   No nutrition interventions warranted at this time. If nutrition issues arise, please consult RD.   Joaquin Courts, RD, LDN, CNSC Pager# [phone] After Hours Pager# [phone]

## 2012-10-19 NOTE — Progress Notes (Signed)
Report called to [facility] on unit 2000, No complaints.

## 2012-10-19 NOTE — Progress Notes (Signed)
VASCULAR LAB PRELIMINARY  ARTERIAL  ABI completed:    RIGHT    LEFT    PRESSURE WAVEFORM  PRESSURE WAVEFORM  BRACHIAL 136 Triphasic BRACHIAL Not obtained due to dialysis access fistula   DP 81  Monophasic DP 78  Monophasic         PT 82 Dampened Monophasic PT 87  Monophasic                  RIGHT LEFT  ABI 0.60 0.64   ABIs indicate a moderate reduction in arterial flow bilaterally.  Virgie Chery, RVS 10/19/2012, 1:29 PM

## 2012-10-19 NOTE — Progress Notes (Signed)
Walked into do assessment and found IV out.  Site unremarkable. CAth intact.  No bleeding from site.

## 2012-10-19 NOTE — Progress Notes (Addendum)
[facility] [place]  Progress Note Bypass Surgery  Date of Surgery: 10/18/2012  Procedure(s): right BYPASS GRAFT FEMORAL-POPLITEAL ARTERY Surgeon: Surgeon(s): Fransisco Hertz, MD Chuck Hint, MD  1 Day Post-Op  History of Present Illness  Tim Kirby is a 72 y.o. male who is S/P Procedure(s): BYPASS GRAFT FEMORAL-POPLITEAL ARTERY right.  The patient states his foot feels fine. Leg is sore but tolerable. Patients pain is well controlled.    VASC. LAB Studies:        ABI: pend   Imaging: No results found.  Significant Diagnostic Studies: CBC Lab Results  Component Value Date   WBC 8.6 10/19/2012   HGB 9.0* 10/19/2012   HCT 27.7* 10/19/2012   MCV 93.3 10/19/2012   PLT 179 10/19/2012    BMET     Component Value Date/Time   NA 133* 10/19/2012 0505   K 5.8* 10/19/2012 0505   CL 92* 10/19/2012 0505   CO2 22 10/19/2012 0505   GLUCOSE 91 10/19/2012 0505   BUN 52* 10/19/2012 0505   CREATININE 12.04* 10/19/2012 0505   CREATININE 9.25* 10/01/2012 1510   CALCIUM 9.7 10/19/2012 0505   GFRNONAA 4* 10/19/2012 0505   GFRAA 4* 10/19/2012 0505    COAG Lab Results  Component Value Date   INR 1.21 10/13/2012   No results found for this basename: PTT    Physical Examination  BP Readings from Last 3 Encounters:  10/19/12 124/72  10/19/12 124/72  10/13/12 151/81   Temp Readings from Last 3 Encounters:  10/19/12 98.2 F (36.8 C) Oral  10/19/12 98.2 F (36.8 C) Oral  10/13/12 97.7 F (36.5 C)    SpO2 Readings from Last 3 Encounters:  10/19/12 97%  10/19/12 97%  10/13/12 95%   Pulse Readings from Last 3 Encounters:  10/19/12 64  10/19/12 64  10/13/12 68    Pt is A&O x 3 right lower extremity: Incision/s is/are clean,dry.intact, and  healing without hematoma, erythema or drainage Limb is warm; with good color  Right Dorsalis Pedis pulse is weak signal per doppler Right Peroneal tibial pulse is  monophasic by  Doppler   Assessment/Plan: Pt. 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  No clinical evidence of bleeding.  Right lower leg is warm with dopplerable peroneal, PT and weakly dopperable DP.  Tsfr to 2000.   HD per Renal.  PT/OT eval pending.  Leonides Sake, MD [facility] [place] Office: [phone] Pager: [phone]  10/19/2012, 9:42 AM

## 2012-10-20 LAB — CBC
HCT: 28.2 % — ABNORMAL LOW (ref 39.0–52.0)
MCV: 94.9 fL (ref 78.0–100.0)
RDW: 14 % (ref 11.5–15.5)
WBC: 10.3 10*3/uL (ref 4.0–10.5)

## 2012-10-20 LAB — RENAL FUNCTION PANEL
Albumin: 3.3 g/dL — ABNORMAL LOW (ref 3.5–5.2)
BUN: 33 mg/dL — ABNORMAL HIGH (ref 6–23)
Chloride: 93 mEq/L — ABNORMAL LOW (ref 96–112)
Creatinine, Ser: 8.98 mg/dL — ABNORMAL HIGH (ref 0.50–1.35)
Glucose, Bld: 110 mg/dL — ABNORMAL HIGH (ref 70–99)

## 2012-10-20 NOTE — Progress Notes (Signed)
Utilization Review Completed.Dorcas Carrow T1/15/2014

## 2012-10-20 NOTE — Progress Notes (Addendum)
[facility] Progress Note  10/20/2012 8:43 AM POD 2  Subjective:  No complaints  Afebrile x 24 hrs VSS 94% RA  Filed Vitals:   10/20/12 0819  BP: 153/83  Pulse: 81  Temp: 98.9 F (37.2 C)  Resp: 20    Physical Exam: Incisions:  All incisions are c/d/i.  Right groin dressing removed and is c/d/i.   Extremities:  Right foot is warm and well perfused.  CBC    Component Value Date/Time   WBC 10.3 10/20/2012 0504   RBC 2.97* 10/20/2012 0504   HGB 9.1* 10/20/2012 0504   HCT 28.2* 10/20/2012 0504   PLT 187 10/20/2012 0504   MCV 94.9 10/20/2012 0504   MCH 30.6 10/20/2012 0504   MCHC 32.3 10/20/2012 0504   RDW 14.0 10/20/2012 0504    BMET    Component Value Date/Time   NA 133* 10/19/2012 0505   K 5.8* 10/19/2012 0505   CL 92* 10/19/2012 0505   CO2 22 10/19/2012 0505   GLUCOSE 91 10/19/2012 0505   BUN 52* 10/19/2012 0505   CREATININE 12.04* 10/19/2012 0505   CREATININE 9.25* 10/01/2012 1510   CALCIUM 9.7 10/19/2012 0505   GFRNONAA 4* 10/19/2012 0505   GFRAA 4* 10/19/2012 0505    INR    Component Value Date/Time   INR 1.21 10/13/2012 1152     Intake/Output Summary (Last 24 hours) at 10/20/12 0843 Last data filed at 10/19/12 1754  Gross per 24 hour  Intake    240 ml  Output   3000 ml  Net  -2760 ml    ABI's 10/19/12:  RIGHT    LEFT     PRESSURE  WAVEFORM   PRESSURE  WAVEFORM   BRACHIAL  136  Triphasic  BRACHIAL  Not obtained due to dialysis access fistula    DP  81  Monophasic  DP  78  Monophasic          PT  82  Dampened Monophasic  PT  87  Monophasic                   RIGHT  LEFT   ABI  0.60  0.64      Assessment/Plan:  72 y.o. male is s/p  1. Right common femoral artery to below-the-knee popliteal bypass with non-reverse saphenous vein 2. Ligation of femoropopliteal aneurysm 3.   POD 2  -bypass is patent-right foot is warm and well perfused -continue increased mobilization and PT -pt had HD yesterday   Doreatha Massed, PA-C [facility] [phone] 10/20/2012 8:43 AM  Addendum  I have independently interviewed and examined the patient, and I agree with the physician assistant's findings.  Appropriate post-op recovery.  Revascularization is better than expected, given diseased peroneal artery as the only runoff on the preop angiogram.  Discharge once ambulating better.  Home PT already recommended.  Will let patient recover for 1-2 months prior to proceeding with left leg.  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  Open repair on left side will likely also be very difficult.    Leonides Sake, MD [facility] [place] Office: [phone] Pager: [phone]  10/20/2012, 3:46 PM

## 2012-10-20 NOTE — Progress Notes (Signed)
Subjective:  No complaints, up in chair  Objective Vital signs in last 24 hours: Filed Vitals:   10/19/12 1929 10/20/12 0413 10/20/12 0819 10/20/12 1019  BP: 137/75 139/84 153/83 138/78  Pulse: 93 84 81   Temp: 98.5 F (36.9 C) 98.4 F (36.9 C) 98.9 F (37.2 C)   TempSrc: Oral Oral Oral   Resp: 20 18 20    Height:      Weight:  82.691 kg (182 lb 4.8 oz)    SpO2: 98% 98% 94%    Weight change: 3 kg (6 lb 9.8 oz)  Intake/Output Summary (Last 24 hours) at 10/20/12 1218 Last data filed at 10/20/12 0819  Gross per 24 hour  Intake    240 ml  Output   3000 ml  Net  -2760 ml   Labs: Basic Metabolic Panel:  Lab 10/19/12 1610 10/18/12 1749 10/18/12 0617  NA 133* -- 136  K 5.8* -- 4.3  CL 92* -- --  CO2 22 -- --  GLUCOSE 91 -- 92  BUN 52* -- --  CREATININE 12.04* 10.62* --  CALCIUM 9.7 -- --  ALB -- -- --  PHOS -- -- --   Liver Function Tests: No results found for this basename: AST:3,ALT:3,ALKPHOS:3,BILITOT:3,PROT:3,ALBUMIN:3 in the last 168 hours No results found for this basename: LIPASE:3,AMYLASE:3 in the last 168 hours No results found for this basename: AMMONIA:3 in the last 168 hours CBC:  Lab 10/20/12 0504 10/19/12 0505 10/18/12 1749  WBC 10.3 8.6 13.6*  NEUTROABS -- -- --  HGB 9.1* 9.0* 10.5*  HCT 28.2* 27.7* 32.3*  MCV 94.9 93.3 94.2  PLT 187 179 166   Cardiac Enzymes: No results found for this basename: CKTOTAL:5,CKMB:5,CKMBINDEX:5,TROPONINI:5 in the last 168 hours CBG: No results found for this basename: GLUCAP:5 in the last 168 hours  Iron Studies: No results found for this basename: IRON,TIBC,TRANSFERRIN,FERRITIN in the last 72 hours Studies/Results: No results found. Medications:      . amLODipine  10 mg Oral Daily  . aspirin  325 mg Oral Daily  . atorvastatin  20 mg Oral q1800  . darbepoetin (ARANESP) injection - DIALYSIS  60 mcg Intravenous Q Tue-HD  . docusate sodium  100 mg Oral Daily  . heparin  5,000 Units Subcutaneous Q8H  .  multivitamin  1 tablet Oral QHS  . pantoprazole  40 mg Oral Daily  . paricalcitol  6 mcg Intravenous Q T,Th,Sa-HD  . sevelamer carbonate  3,200 mg Oral TID WC   I  have reviewed scheduled and prn medications.  Physical Exam:     General: Adult BM , NAD ,Appropriate Heart: RRR, no rub or Murmur  Lungs: CTA bilaterally   Abdomen: Bs +=, soft nontender  Extremities: No pedal edema, Right Foot warm to touch, 1+ R leg edema, wounds intact Dialysis Access: Pos. Bruit Left FA AVF   Dialysis Orders: Center:[place] on TTS .  EDW 82.0 kg HD Bath 2.0 K. 2.0 Ca Time 4hrs Heparin none sec to surgery. Access Left fa AVF BFR 450 DFR 800  Zemplar 6 mcg IV/HD ( ipth 562 on 08/26/12) Epogen 1200 Units IV/HD Venofer 0  Other 0   Assessment/Plan  1. S/P right femoral-popliteal bypass grafting w ligation of large femoral-popliteal pseudoaneurysm per Dr. Imogene Burn, 10/18/12 2. ESRD - continue TTS ( [facility] center) schedule , tight hep next HD 3. Hypertension/volume - post op bp 133/79  on Amlodipine 10mg  q hs( op med)/ last cxr 10/13/12. Just over dry weight < 1kg, plan HD tomorrow  UF 1-2 kg 4. Anemia - hgb 11.2 to post op lower level of 9.0 this am  Low dose Aranesp, not on iron. Follow  5. Metabolic bone disease - Ca 9.7   With normal alb. This ca better continue vit d / Using 2.25 ca bath , Renvela binder/ continue vit d 6. 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  Vinson Moselle  MD [facility] [phone] pgr    [phone] cell 10/20/2012, 12:26 PM

## 2012-10-20 NOTE — Progress Notes (Signed)
OT Cancellation Note  Patient Details Name: [name] MRN: [id number] DOB: 28-Jun-1941   Cancelled Treatment:    Reason Eval/Treat Not Completed: Fatigue/lethargy limiting ability to participate;Other (pt reporting he does not feel up to getting out of bed right now).  Educated pt on benefit of OT to assist in preparing for return home, but pt requesting OT to return later.  Will re-attempt later this afternoon as time allows. RN aware.  10/20/2012 [name] OTR/L Pager [phone] Office [phone]

## 2012-10-20 NOTE — Progress Notes (Deleted)
Subjective:  No cos sitting in bed side chair, tolerated d hd yesterday Objective Vital signs in last 24 hours: Filed Vitals:   10/19/12 1929 10/20/12 0413 10/20/12 0819 10/20/12 1019  BP: 137/75 139/84 153/83 138/78  Pulse: 93 84 81   Temp: 98.5 F (36.9 C) 98.4 F (36.9 C) 98.9 F (37.2 C)   TempSrc: Oral Oral Oral   Resp: 20 18 20    Height:      Weight:  82.691 kg (182 lb 4.8 oz)    SpO2: 98% 98% 94%    Weight change: 3 kg (6 lb 9.8 oz)  Intake/Output Summary (Last 24 hours) at 10/20/12 1204 Last data filed at 10/20/12 0819  Gross per 24 hour  Intake    240 ml  Output   3000 ml  Net  -2760 ml   Labs: Basic Metabolic Panel:  Lab 10/19/12 7829 10/18/12 1749 10/18/12 0617  NA 133* -- 136  K 5.8* -- 4.3  CL 92* -- --  CO2 22 -- --  GLUCOSE 91 -- 92  BUN 52* -- --  CREATININE 12.04* 10.62* --  CALCIUM 9.7 -- --  ALB -- -- --  PHOS -- -- --   Liver Function Tests: No results found for this basename: AST:3,ALT:3,ALKPHOS:3,BILITOT:3,PROT:3,ALBUMIN:3 in the last 168 hours No results found for this basename: LIPASE:3,AMYLASE:3 in the last 168 hours No results found for this basename: AMMONIA:3 in the last 168 hours CBC:  Lab 10/20/12 0504 10/19/12 0505 10/18/12 1749  WBC 10.3 8.6 13.6*  NEUTROABS -- -- --  HGB 9.1* 9.0* 10.5*  HCT 28.2* 27.7* 32.3*  MCV 94.9 93.3 94.2  PLT 187 179 166   Cardiac Enzymes: No results found for this basename: CKTOTAL:5,CKMB:5,CKMBINDEX:5,TROPONINI:5 in the last 168 hours CBG: No results found for this basename: GLUCAP:5 in the last 168 hours  Iron Studies: No results found for this basename: IRON,TIBC,TRANSFERRIN,FERRITIN in the last 72 hours Studies/Results: No results found. Medications:      . amLODipine  10 mg Oral Daily  . aspirin  325 mg Oral Daily  . atorvastatin  20 mg Oral q1800  . darbepoetin (ARANESP) injection - DIALYSIS  60 mcg Intravenous Q Tue-HD  . docusate sodium  100 mg Oral Daily  . heparin  5,000 Units  Subcutaneous Q8H  . multivitamin  1 tablet Oral QHS  . pantoprazole  40 mg Oral Daily  . paricalcitol  6 mcg Intravenous Q T,Th,Sa-HD  . sevelamer carbonate  3,200 mg Oral TID WC   I  have reviewed scheduled and prn medications.   Physical Exam:     General: Adult BM , NAD ,Appropriate  Heart: RRR, no rub or Murmur  Lungs: CTA bilaterally  Abdomen: Bs +=, soft nontender  Extremities: trace bipedal edema, Right Foot warm to touch / surgical  site healing. Dialysis Access: Pos. Bruit Left FA AVF   Dialysis Orders: Center:[place] on TTS .  EDW 82.0 kg HD Bath 2.0 K. 2.0 Ca Time 4hrs Heparin none sec to surgery. Access Left fa AVF BFR 450 DFR 800  Zemplar 6 mcg IV/HD ( ipth 562 on 08/26/12) Epogen 1200 Units IV/HD Venofer 0  Other 0   Assessment/Plan  1. 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 Imogene Burn, 10/18/12 2. ESRD - continue TTS ([facility]) schedule , no heparin hd yesterday tight hep tommorow HD .   3. Hypertension/volume -  bp 138/78 on Amlodipine 10mg  q hs( op med)/  Wt  Today 82.7 near edw. 3 l uf yesterday hd tolerated andtoday Mild pedal edema post op changes vs small vol , lower edw as op in am attempt 3to 4 l uf as tolerated. Keep sbp >100 4. Anemia - hgb 11.2 to post op lower level of 9.0> 9.1 on  Aranesp 60 mcg/ no iron/ will increase To higher as op. Fu trend. 5. Metabolic bone disease - Ca 9.7 With normal alb. This ca better continue vit d / Using 2.25 ca bath , Renvela binder/ continue vit d 6. Thyroid Nodule Folded by repeat ct scans Aug 2014 7. 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  Lenny Pastel, PA-C [facility] Beeper [phone] 10/20/2012,12:04 PM  LOS: 2 days

## 2012-10-21 LAB — RENAL FUNCTION PANEL
CO2: 24 mEq/L (ref 19–32)
Calcium: 10.4 mg/dL (ref 8.4–10.5)
Chloride: 92 mEq/L — ABNORMAL LOW (ref 96–112)
Creatinine, Ser: 11.06 mg/dL — ABNORMAL HIGH (ref 0.50–1.35)
Glucose, Bld: 94 mg/dL (ref 70–99)

## 2012-10-21 LAB — CBC
HCT: 26.7 % — ABNORMAL LOW (ref 39.0–52.0)
Hemoglobin: 8.8 g/dL — ABNORMAL LOW (ref 13.0–17.0)
MCH: 30.8 pg (ref 26.0–34.0)
MCV: 93.4 fL (ref 78.0–100.0)
RBC: 2.86 MIL/uL — ABNORMAL LOW (ref 4.22–5.81)
WBC: 9.3 10*3/uL (ref 4.0–10.5)

## 2012-10-21 MED ORDER — LIDOCAINE-PRILOCAINE 2.5-2.5 % EX CREA: 1.0000 "application " | TOPICAL_CREAM | CUTANEOUS | Status: DC | PRN

## 2012-10-21 MED ORDER — PENTAFLUOROPROP-TETRAFLUOROETH EX AERO: 1.0000 "application " | INHALATION_SPRAY | CUTANEOUS | Status: DC | PRN

## 2012-10-21 MED ORDER — PARICALCITOL 5 MCG/ML IV SOLN: 2.0000 ug | INTRAVENOUS | Status: DC

## 2012-10-21 MED ORDER — DARBEPOETIN ALFA-POLYSORBATE 100 MCG/0.5ML IJ SOLN: 100.0000 ug | INTRAMUSCULAR | Status: DC

## 2012-10-21 MED ORDER — ALTEPLASE 2 MG IJ SOLR: 2.0000 mg | Freq: Once | INTRAMUSCULAR | Status: DC | PRN

## 2012-10-21 MED ORDER — NEPRO/CARBSTEADY PO LIQD: 237.0000 mL | ORAL | Status: DC | PRN

## 2012-10-21 MED ORDER — LIDOCAINE HCL (PF) 1 % IJ SOLN: 5.0000 mL | INTRAMUSCULAR | Status: DC | PRN

## 2012-10-21 MED ORDER — SODIUM CHLORIDE 0.9 % IV SOLN: 100.0000 mL | INTRAVENOUS | Status: DC | PRN

## 2012-10-21 MED ORDER — HEPARIN SODIUM (PORCINE) 1000 UNIT/ML DIALYSIS
1000.0000 [IU] | INTRAMUSCULAR | Status: DC | PRN
  Administered 2012-10-21: 1000 [IU] via INTRAVENOUS_CENTRAL

## 2012-10-21 MED ORDER — HEPARIN SODIUM (PORCINE) 1000 UNIT/ML DIALYSIS: 20.0000 [IU]/kg | INTRAMUSCULAR | Status: DC | PRN

## 2012-10-21 MED ORDER — OXYCODONE-ACETAMINOPHEN 5-325 MG PO TABS
ORAL_TABLET | ORAL | Status: AC
  Administered 2012-10-21: 2 via ORAL
  Filled 2012-10-21: qty 2

## 2012-10-21 NOTE — Procedures (Signed)
I was present at this dialysis session. I have reviewed the session itself and made appropriate changes.   Vinson Moselle, MD [employer] 10/21/2012, 10:55 AM

## 2012-10-21 NOTE — Progress Notes (Addendum)
[facility]  Progress Note Bypass Surgery  Date of Surgery: 10/18/2012  Procedure(s): Right BYPASS GRAFT FEMORAL-POPLITEAL ARTERY Surgeon: Surgeon(s): Fransisco Hertz, MD Chuck Hint, MD  3 Days Post-Op  History of Present Illness  Tim Kirby is a 72 y.o. male who is S/P  BYPASS GRAFT FEMORAL-POPLITEAL ARTERY right.  The patient' is doing well. States he is comfortable. No C/O numbness or tingling in the leg. Slow to ambulate.  VASC. LAB Studies:        ABI: Right 0.60;  Left 0.64;   Significant Diagnostic Studies: CBC Lab Results  Component Value Date   WBC 10.3 10/20/2012   HGB 9.1* 10/20/2012   HCT 28.2* 10/20/2012   MCV 94.9 10/20/2012   PLT 187 10/20/2012    BMET     Component Value Date/Time   NA 136 10/20/2012 1300   K 4.4 10/20/2012 1300   CL 93* 10/20/2012 1300   CO2 28 10/20/2012 1300   GLUCOSE 110* 10/20/2012 1300   BUN 33* 10/20/2012 1300   CREATININE 8.98* 10/20/2012 1300   CREATININE 9.25* 10/01/2012 1510   CALCIUM 10.5 10/20/2012 1300   GFRNONAA 5* 10/20/2012 1300   GFRAA 6* 10/20/2012 1300    COAG Lab Results  Component Value Date   INR 1.21 10/13/2012   No results found for this basename: PTT    Physical Examination  BP Readings from Last 3 Encounters:  10/21/12 146/86  10/21/12 146/86  10/13/12 151/81   Temp Readings from Last 3 Encounters:  10/21/12 98.4 F (36.9 C) Oral  10/21/12 98.4 F (36.9 C) Oral  10/13/12 97.7 F (36.5 C)    SpO2 Readings from Last 3 Encounters:  10/21/12 94%  10/21/12 94%  10/13/12 95%   Pulse Readings from Last 3 Encounters:  10/21/12 77  10/21/12 77  10/13/12 68    Pt is A&O x 3 right lower extremity: Incision/s is/are clean,dry.intact, and  healing without hematoma, erythema or drainage Limb is warm; with good color  Assessment/Plan: Pt. Doing well - needs to ambulate Post-op pain is controlled Wounds are healing well PT/OT for  ambulation  Marlowe [place] [phone] 10/21/2012 7:42 AM  Addendum  I have independently interviewed and examined the patient, and I agree with the physician assistant's findings.  Home once services arranged and ambulating better.  Leonides Sake, MD [facility] [place] Office: [phone] Pager: [phone]  10/21/2012, 12:58 PM

## 2012-10-21 NOTE — Progress Notes (Signed)
Subjective:  On hd, no cos Objective Vital signs in last 24 hours: Filed Vitals:   10/21/12 0710 10/21/12 0725 10/21/12 0757 10/21/12 0828  BP: 149/87 146/86 150/77 148/92  Pulse: 79 77 76 77  Temp: 98.4 F (36.9 C)     TempSrc: Oral     Resp: 18 18 18 15   Height:      Weight: 83.8 kg (184 lb 11.9 oz)     SpO2: 94%      Weight change: -3.1 kg (-6 lb 13.3 oz)  Intake/Output Summary (Last 24 hours) at 10/21/12 0939 Last data filed at 10/20/12 1716  Gross per 24 hour  Intake    480 ml  Output    200 ml  Net    280 ml   Labs: Basic Metabolic Panel:  Lab 10/21/12 6213 10/20/12 1300 10/19/12 0505  NA 134* 136 133*  K 4.7 4.4 5.8*  CL 92* 93* 92*  CO2 24 28 22   GLUCOSE 94 110* 91  BUN 42* 33* 52*  CREATININE 11.06* 8.98* 12.04*  CALCIUM 10.4 10.5 9.7  ALB -- -- --  PHOS 7.2* 6.3* --   Liver Function Tests:  Lab 10/21/12 0749 10/20/12 1300  AST -- --  ALT -- --  ALKPHOS -- --  BILITOT -- --  PROT -- --  ALBUMIN 3.3* 3.3*   No results found for this basename: LIPASE:3,AMYLASE:3 in the last 168 hours No results found for this basename: AMMONIA:3 in the last 168 hours CBC:  Lab 10/21/12 0749 10/20/12 0504 10/19/12 0505 10/18/12 1749  WBC 9.3 10.3 8.6 --  NEUTROABS -- -- -- --  HGB 8.8* 9.1* 9.0* --  HCT 26.7* 28.2* 27.7* --  MCV 93.4 94.9 93.3 94.2  PLT 170 187 179 --   Cardiac Enzymes: No results found for this basename: CKTOTAL:5,CKMB:5,CKMBINDEX:5,TROPONINI:5 in the last 168 hours CBG: No results found for this basename: GLUCAP:5 in the last 168 hours  Iron Studies: No results found for this basename: IRON,TIBC,TRANSFERRIN,FERRITIN in the last 72 hours Studies/Results: No results found. Medications:      . amLODipine  10 mg Oral Daily  . aspirin  325 mg Oral Daily  . atorvastatin  20 mg Oral q1800  . darbepoetin (ARANESP) injection - DIALYSIS  60 mcg Intravenous Q Tue-HD  . docusate sodium  100 mg Oral Daily  . heparin  5,000 Units Subcutaneous  Q8H  . multivitamin  1 tablet Oral QHS  . pantoprazole  40 mg Oral Daily  . paricalcitol  2 mcg Intravenous Q T,Th,Sa-HD  . sevelamer carbonate  3,200 mg Oral TID WC   I  have reviewed scheduled and prn medications. Physical EXAM=    General: Adult BM , NAD ,Appropriate  Heart: RRR, no rub or Murmur  Lungs: CTA bilaterally  Abdomen: Bs +=, soft nontender  Extremities: No pedal edema, Right Foot warm to touch, 1+ R leg edema, wounds intact  Dialysis Access:  Patent on hd  Left FA AVF   Dialysis Orders: Center:[place] on TTS .  EDW 82.0 kg HD Bath 2.0 K. 2.0 Ca Time 4hrs Heparin none sec to surgery. Access Left fa AVF BFR 450 DFR 800  Zemplar 6 mcg IV/HD ( ipth 562 on 08/26/12) Epogen 1200 Units IV/HD Venofer 0  Other 0   Assessment/Plan  1. S/P right femoral-popliteal bypass grafting w ligation of large femoral-popliteal pseudoaneurysm per Dr. Imogene Burn, 10/18/12 2. ESRD - continue TTS ( kidney center) schedule , tight hep 3. Hypertension/volume -  bp 148/92  on Amlodipine 10mg  q hs( op med)/  PHlast cxr 10/13/12. Just over dry weight 1.8,  UF -2 kg on hd today 4. Anemia - hgb 11.2 to post op lower level of 8.8 this am Low dose Aranesp 60 mcg  Increase to 100 next dose, check iron studies not on iron. Follow  5. Metabolic bone disease - Ca 10.4 corrected Ca= 11.1/ phos 7.2   / Using 2.25 ca bath , Renvela binder 4 ac / hold  vit d with high ca /phos ratio  And high ca corrected 6. 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  Agree with assessment and plan as above. Vinson Moselle  MD [facility] [phone] pgr    [phone] cell 10/21/2012, 11:04 AM

## 2012-10-21 NOTE — Progress Notes (Signed)
PT Cancellation Note  Patient Details Name: [name] MRN: [id number] DOB: [birth year]   Cancelled Treatment:    Reason Eval/Treat Not Completed: Fatigue/lethargy limiting ability to participate;Other (comment) (fatigue from HD today)   Greggory Stallion 10/21/2012, 1:41 PM

## 2012-10-21 NOTE — Progress Notes (Signed)
OT Cancellation Note  Patient Details Name: Tim Kirby MRN: [id number] DOB: 06/13/1941   Cancelled Treatment:    Reason Eval/Treat Not Completed:Fatigue/lethargy limiting ability to participate;Other (Fatigued from HD today). Attempted to see in AM but pt in HD. Returned in PM but pt reports he is too fatigued to participate in therapy today.  Educated pt on benefit of OT, but pt declined.  Will re-attempt at later date.  10/21/2012 [name] OTR/L Pager [phone] Office [phone]

## 2012-10-21 NOTE — Care Management Note (Unsigned)
    Page 1 of 1   10/21/2012     4:27:07 PM   CARE MANAGEMENT NOTE 10/21/2012  Patient:  Tim Kirby, Tim Kirby   Account Number:  [id number]  Date Initiated:  10/21/2012  Documentation initiated by:  Kimanh Templeman  Subjective/Objective Assessment:   PT S/P RT FEM POP BYPASS.  PTA, PT LIVES WITH WIFE. HE GOES TO HD TUES,THURS, SAT.     Action/Plan:   MET WITH PT TO DISCUSS DC ARRANGEMENTS.  HE IS AGREEABLE TO HH FOR PT/OT AND RW FOR HOME.  REFERRAL TO [facility], PER PT CHOICE.  START OF CARE 24-48H POST DC DATE.   Anticipated DC Date:  10/23/2012   Anticipated DC Plan:  HOME W HOME HEALTH SERVICES      DC Planning Services  CM consult      Choice offered to / List presented to:             Status of service:  In process, will continue to follow Medicare Important Message given?   (If response is "NO", the following Medicare IM given date fields will be blank) Date Medicare IM given:   Date Additional Medicare IM given:    Discharge Disposition:    Per UR Regulation:  Reviewed for med. necessity/level of care/duration of stay  If discussed at Long Length of Stay Meetings, dates discussed:    Comments:

## 2012-10-22 NOTE — Progress Notes (Signed)
  [facility] Progress Note  Subjective:   Feels ok. No complaints.  Objective Filed Vitals:   10/22/12 0822 10/22/12 1425 10/22/12 1445 10/22/12 1518  BP: 135/78 119/73 129/64 126/76  Pulse: 82 80 81 73  Temp: 98 F (36.7 C) 98.1 F (36.7 C) 98.6 F (37 C) 98.2 F (36.8 C)  TempSrc: Oral Oral Oral Oral  Resp: 18 16 16 18   Height:      Weight:      SpO2:       Physical Exam General: Alert, cooperative, NAD Heart: RRR Lungs: CTA bilaterally.  No wheezes, rales, or rhonchi appreciated Abdomen: Soft, non-tender, non-distended. Normal BS Extremities: No edema left.  Post-operative edema on right, s/p fem-pop bypass. Incision clean/dry. Dialysis Access: LFA AVF with bruit  Dialysis Orders: Center:[place] on TTS . EDW 82.0 kg HD Bath 2.0 K. 2.0 Ca Time 4hrs Heparin none sec to surgery. Access Left fa AVF BFR 450 DFR 800 Zemplar 6 mcg IV/HD ( ipth 562 on 08/26/12) Epogen 1200 Units IV/HD Venofer 0   Assessment/Plan: 1. S/P R fem-pop with PA resection 2. ESRD, cont tts HD, tight hep 3. HTN/volume- BP normal, cont meds, stable volume just below EDW 4. Anemia of CKD/blood loss- Hb 8-9 range, darbe ^'d to 100/wk 5. Metabolic bone disease - high Ca and product, holding vit D, cont binder, use low Ca bath 6. Nutrition - Albumin 3.3. High protein renal diet, multivitamin, Nepro 7. Hx of tobacco abuse/? Copd mild - lungs remain clear   Scot Jun. Thad Ranger [facility] [phone] pager 10/22/2012,3:43 PM  LOS: 4 days   Patient seen and examined and above reviewed.  Agree with assessment and plan as above. Vinson Moselle  MD [facility] [phone] pgr    [phone] cell 10/22/2012, 4:28 PM   Additional Objective Labs: Basic Metabolic Panel:  Lab 10/21/12 8657 10/20/12 1300 10/19/12 0505  NA 134* 136 133*  K 4.7 4.4 5.8*  CL 92* 93* 92*  CO2 24 28 22   GLUCOSE 94 110* 91  BUN 42* 33* 52*  CREATININE 11.06* 8.98* 12.04*  CALCIUM  10.4 10.5 9.7  ALB -- -- --  PHOS 7.2* 6.3* --   Liver Function Tests:  Lab 10/21/12 0749 10/20/12 1300  AST -- --  ALT -- --  ALKPHOS -- --  BILITOT -- --  PROT -- --  ALBUMIN 3.3* 3.3*   CBC:  Lab 10/21/12 0749 10/20/12 0504 10/19/12 0505 10/18/12 1749  WBC 9.3 10.3 8.6 --  NEUTROABS -- -- -- --  HGB 8.8* 9.1* 9.0* --  HCT 26.7* 28.2* 27.7* --  MCV 93.4 94.9 93.3 94.2  PLT 170 187 179 --    Medications:      . amLODipine  10 mg Oral Daily  . aspirin  325 mg Oral Daily  . atorvastatin  20 mg Oral q1800  . darbepoetin (ARANESP) injection - DIALYSIS  100 mcg Intravenous Q Tue-HD  . docusate sodium  100 mg Oral Daily  . heparin  5,000 Units Subcutaneous Q8H  . multivitamin  1 tablet Oral QHS  . pantoprazole  40 mg Oral Daily  . sevelamer carbonate  3,200 mg Oral TID WC

## 2012-10-22 NOTE — Progress Notes (Signed)
[facility] [place]  Daily Progress Note  Assessment/Planning: POD #4 s/p R femoropopliteal aneurysm exclusion, R CFA to TPT bypass   Ok to D/C once ambulating adequately  Sluggish participation with OT/PT  No signs of active hemorrhage  Transfuse 1 unit pRBC given complaints of fatigue  Subjective  - 4 Days Post-Op  Feeling tired, don't want to get out of bed  Objective Filed Vitals:   10/21/12 1100 10/21/12 1125 10/21/12 2221 10/22/12 0601  BP: 153/86 143/86 133/71 137/78  Pulse: 81 82 76 74  Temp:  97.5 F (36.4 C) 97.9 F (36.6 C) 98.4 F (36.9 C)  TempSrc:  Oral Oral Oral  Resp:   18 18  Height:      Weight:  180 lb 12.4 oz (82 kg)  179 lb 0.2 oz (81.2 kg)  SpO2:  93% 94% 96%    Intake/Output Summary (Last 24 hours) at 10/22/12 0716 Last data filed at 10/21/12 1800  Gross per 24 hour  Intake    240 ml  Output   2000 ml  Net  -1760 ml    PULM  CTAB CV  RRR GI  soft, NTND VASC  Incisions c/d/i, staples in place, R leg swollen: 1+  Laboratory CBC    Component Value Date/Time   WBC 9.3 10/21/2012 0749   HGB 8.8* 10/21/2012 0749   HCT 26.7* 10/21/2012 0749   PLT 170 10/21/2012 0749    BMET    Component Value Date/Time   NA 134* 10/21/2012 0749   K 4.7 10/21/2012 0749   CL 92* 10/21/2012 0749   CO2 24 10/21/2012 0749   GLUCOSE 94 10/21/2012 0749   BUN 42* 10/21/2012 0749   CREATININE 11.06* 10/21/2012 0749   CREATININE 9.25* 10/01/2012 1510   CALCIUM 10.4 10/21/2012 0749   GFRNONAA 4* 10/21/2012 0749   GFRAA 5* 10/21/2012 0749    Leonides Sake, MD [facility] [place] Office: [phone] Pager: [phone]  10/22/2012, 7:16 AM

## 2012-10-22 NOTE — Progress Notes (Signed)
Physical Therapy Treatment Patient Details Name: Tim Kirby MRN: [id number] DOB: [birth year] Today's Date: 10/22/2012 Time: 1478-2956 PT Time Calculation (min): 18 min  PT Assessment / Plan / Recommendation Comments on Treatment Session  Much improved mobility today.     Follow Up Recommendations  Home health PT;Supervision/Assistance - 24 hour     Does the patient have the potential to tolerate intense rehabilitation     Barriers to Discharge        Equipment Recommendations  Rolling walker with 5" wheels    Recommendations for Other Services    Frequency Min 3X/week   Plan Discharge plan remains appropriate;Frequency remains appropriate    Precautions / Restrictions Precautions Precautions: Fall Restrictions Weight Bearing Restrictions: No   Pertinent Vitals/Pain     Mobility  Bed Mobility Bed Mobility: Supine to Sit;Sitting - Scoot to Edge of Bed Supine to Sit: 5: Supervision;With rails;HOB flat Sitting - Scoot to Edge of Bed: 5: Supervision Details for Bed Mobility Assistance: No cues or assist needed Transfers Transfers: Sit to Stand;Stand to Sit Sit to Stand: 4: Min guard;With upper extremity assist;From bed;From toilet Stand to Sit: 4: Min guard;With upper extremity assist;To toilet;To chair/3-in-1;With armrests Details for Transfer Assistance: Verbal cues for hand placement.  Assist for balance/safety only. Ambulation/Gait Ambulation/Gait Assistance: 4: Min guard Ambulation Distance (Feet): 220 Feet Assistive device: Rolling walker Ambulation/Gait Assistance Details: Verbal cues for gait sequence and to use heel-toe step with RLE - patient walking on toes on right.   Gait Pattern: Step-through pattern;Decreased step length - left;Trunk flexed;Decreased stance time - right;Antalgic Gait velocity: slow gait speed      PT Goals Acute Rehab PT Goals PT Goal: Supine/Side to Sit - Progress: Progressing toward goal PT Goal: Sit to Stand - Progress:  Progressing toward goal PT Goal: Stand to Sit - Progress: Progressing toward goal PT Goal: Ambulate - Progress: Progressing toward goal  Visit Information  Last PT Received On: 10/22/12 Assistance Needed: +1    Subjective Data  Subjective: "I'm doing pretty well today"   Cognition  Overall Cognitive Status: Appears within functional limits for tasks assessed/performed Arousal/Alertness: Awake/alert Orientation Level: Appears intact for tasks assessed Behavior During Session: [facility] for tasks performed    Balance     End of Session PT - End of Session Equipment Utilized During Treatment: Gait belt Activity Tolerance: Patient tolerated treatment well Patient left: in chair;with call bell/phone within reach Nurse Communication: Mobility status   GP     Vena Austria 10/22/2012, 2:50 PM Durenda Hurt. Renaldo Fiddler, [facility] Acute Rehab Services Pager [phone]

## 2012-10-22 NOTE — Evaluation (Signed)
Occupational Therapy Evaluation and Discharge Patient Details Name: Tim Kirby MRN: [id number] DOB: [birth year] Today's Date: 10/22/2012 Time: 4098-1191 OT Time Calculation (min): 8 min  OT Assessment / Plan / Recommendation Clinical Impression  This 72 yo male s/p R fem-pop bypass graft presents to acute OT at a level of S/A he will have at home. No futher OT needs found, will D/C from acute OT.    OT Assessment  Patient does not need any further OT services    Follow Up Recommendations  No OT follow up       Equipment Recommendations  None recommended by OT          Precautions / Restrictions Precautions Precautions: Fall Restrictions Weight Bearing Restrictions: No       ADL  Equipment Used: Rolling walker;Gait belt Transfers/Ambulation Related to ADLs: Min guard A ADL Comments: Pt at a set-up/S level for all BADLs, with Min guard A when up on feet. Bend forward to doff/don socks sitting in recliner        Visit Information  Last OT Received On: 10/22/12 Assistance Needed: +1 PT/OT Co-Evaluation/Treatment:  (partial--I came in at the end of PT session)    Subjective Data  Subjective: I can do that, I have been practicing since the beginning of the week even thought I have not gone (Re: getting up and down from the toliet)   Prior Functioning     Home Living Lives With: Spouse (mother in law) Available Help at Discharge: Family Type of Home: House Home Access: Stairs to enter Secretary/administrator of Steps: 1 Entrance Stairs-Rails: None Home Layout: One level Bathroom Shower/Tub: Walk-in shower;Tub/shower unit (has both) Bathroom Toilet: Handicapped height (has both ) Home Adaptive Equipment: Shower chair with back;Grab bars around toilet Prior Function Level of Independence: Independent Able to Take Stairs?: Yes Driving: Yes 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 [employer] Communication: No difficulties Dominant  Hand: Right            Cognition  Overall Cognitive Status: Appears within functional limits for tasks assessed/performed Arousal/Alertness: Awake/alert Orientation Level: Appears intact for tasks assessed Behavior During Session: [facility] for tasks performed    Extremity/Trunk Assessment Right Upper Extremity Assessment RUE ROM/Strength/Tone: Within functional levels Left Upper Extremity Assessment LUE ROM/Strength/Tone: Within functional levels     Mobility Transfers Transfers: Stand to Sit Stand to Sit: 4: Min guard;With upper extremity assist;With armrests;To chair/3-in-1              End of Session OT - End of Session Equipment Utilized During Treatment: Gait belt Activity Tolerance: Patient tolerated treatment well Patient left: in chair;with call bell/phone within reach Nurse Communication: Mobility status (nursing student with pt entire PT and OT session)       Evette Georges [phone] 10/22/2012, 2:12 PM

## 2012-10-23 LAB — CBC
Hemoglobin: 9.2 g/dL — ABNORMAL LOW (ref 13.0–17.0)
MCH: 30.3 pg (ref 26.0–34.0)
MCV: 91.8 fL (ref 78.0–100.0)
Platelets: 177 10*3/uL (ref 150–400)
RBC: 3.04 MIL/uL — ABNORMAL LOW (ref 4.22–5.81)
WBC: 8.4 10*3/uL (ref 4.0–10.5)

## 2012-10-23 LAB — RENAL FUNCTION PANEL
CO2: 25 mEq/L (ref 19–32)
Calcium: 10.4 mg/dL (ref 8.4–10.5)
Chloride: 90 mEq/L — ABNORMAL LOW (ref 96–112)
Creatinine, Ser: 10.39 mg/dL — ABNORMAL HIGH (ref 0.50–1.35)
GFR calc Af Amer: 5 mL/min — ABNORMAL LOW (ref >90)
GFR calc non Af Amer: 4 mL/min — ABNORMAL LOW (ref >90)
Glucose, Bld: 136 mg/dL — ABNORMAL HIGH (ref 70–99)
Sodium: 134 mEq/L — ABNORMAL LOW (ref 135–145)

## 2012-10-23 LAB — IRON AND TIBC
Iron: 27 ug/dL — ABNORMAL LOW (ref 42–135)
Saturation Ratios: 15 % — ABNORMAL LOW (ref 20–55)
TIBC: 183 ug/dL — ABNORMAL LOW (ref 215–435)

## 2012-10-23 LAB — TYPE AND SCREEN
ABO/RH(D): O POS
Antibody Screen: NEGATIVE

## 2012-10-23 NOTE — Progress Notes (Signed)
Subjective:  On hd no cos" Im ready to go" Objective Vital signs in last 24 hours: Filed Vitals:   10/23/12 0753 10/23/12 0812 10/23/12 0830 10/23/12 0900  BP: 143/60 138/77 124/75 125/68  Pulse: 82 77 72 75  Temp: 98.4 F (36.9 C)     TempSrc: Oral     Resp: 12 19 18 19   Height:      Weight: 82.3 kg (181 lb 7 oz)     SpO2: 96%      Weight change: -2.107 kg (-4 lb 10.3 oz)  Intake/Output Summary (Last 24 hours) at 10/23/12 0924 Last data filed at 10/22/12 1801  Gross per 24 hour  Intake    550 ml  Output      0 ml  Net    550 ml   Labs: Basic Metabolic Panel:  Lab 10/21/12 2956 10/20/12 1300 10/19/12 0505  NA 134* 136 133*  K 4.7 4.4 5.8*  CL 92* 93* 92*  CO2 24 28 22   GLUCOSE 94 110* 91  BUN 42* 33* 52*  CREATININE 11.06* 8.98* 12.04*  CALCIUM 10.4 10.5 9.7  ALB -- -- --  PHOS 7.2* 6.3* --   Liver Function Tests:  Lab 10/21/12 0749 10/20/12 1300  AST -- --  ALT -- --  ALKPHOS -- --  BILITOT -- --  PROT -- --  ALBUMIN 3.3* 3.3*   No results found for this basename: LIPASE:3,AMYLASE:3 in the last 168 hours No results found for this basename: AMMONIA:3 in the last 168 hours CBC:  Lab 10/23/12 0855 10/21/12 0749 10/20/12 0504 10/19/12 0505 10/18/12 1749  WBC 8.4 9.3 10.3 -- --  NEUTROABS -- -- -- -- --  HGB 9.2* 8.8* 9.1* -- --  HCT 27.9* 26.7* 28.2* -- --  MCV 91.8 93.4 94.9 93.3 94.2  PLT 177 170 187 -- --   Cardiac Enzymes: No results found for this basename: CKTOTAL:5,CKMB:5,CKMBINDEX:5,TROPONINI:5 in the last 168 hours CBG: No results found for this basename: GLUCAP:5 in the last 168 hours  Iron Studies: No results found for this basename: IRON,TIBC,TRANSFERRIN,FERRITIN in the last 72 hours Studies/Results: No results found. Medications:      . amLODipine  10 mg Oral Daily  . aspirin  325 mg Oral Daily  . atorvastatin  20 mg Oral q1800  . darbepoetin (ARANESP) injection - DIALYSIS  100 mcg Intravenous Q Tue-HD  . docusate sodium  100 mg  Oral Daily  . heparin  5,000 Units Subcutaneous Q8H  . multivitamin  1 tablet Oral QHS  . pantoprazole  40 mg Oral Daily  . sevelamer carbonate  3,200 mg Oral TID WC   I  have reviewed scheduled and prn medications.  Physical Exam:    General on hd: , NAD ,Appropriate  Heart: RRR, no rub or Murmur  Lungs: CTA bilaterally  Abdomen: Bs +=, soft nontender  Extremities: No pedal edema, Right Foot warm to touch, 1+ R leg edema, wounds intact  Dialysis Access: Patent on hd Left FA AVF   Dialysis Orders: Center:[facility] on TTS .  EDW 82.0 kg HD Bath 2.0 K. 2.0 Ca Time 4hrs Heparin none sec to surgery. Access Left fa AVF BFR 450 DFR 800  Zemplar 6 mcg IV/HD ( ipth 562 on 08/26/12) Epogen 1200 Units IV/HD Venofer 0  Other 0   Assessment/Plan  1. S/P right femoral-popliteal bypass grafting w ligation of large femoral-popliteal pseudoaneurysm per Dr. Imogene Burn, 10/18/12/ ambulating per pt 2. ESRD - continue TTS ([facility]) schedule ,  tight hep 3. Hypertension/volume - bp stable on Amlodipine 10mg  q hs. Wt 82.3 dry weight 82.0 and UF - tolerating so far 4. Anemia - hgb 11.2 to post op lower level of 8.8 >9.2 Aranesp  100 , checking iron studies not on iron.  5. Metabolic bone disease - Ca 10.4 corrected Ca= 11.1/ phos 7.2 / Using 2.25 ca bath , Renvela binder 4 ac / hold vit d with high ca /phos ratio And high ca corrected 6. 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 Dispo- per [facility]  Lenny Pastel, PA-C [facility] Beeper [phone] 10/23/2012,9:24 AM  LOS: 5 days   Patient seen and examined and above reviewed.  Agree with assessment and plan as above. Vinson Moselle  MD [facility] [phone] pgr    [phone] cell 10/23/2012, 11:37 AM

## 2012-10-23 NOTE — Progress Notes (Signed)
Right foot warm no numbness or tingling walking some  Filed Vitals:   10/23/12 1100 10/23/12 1130 10/23/12 1200 10/23/12 1216  BP: 140/71 118/72 133/69 134/73  Pulse: 69 68 71 77  Temp:    97.5 F (36.4 C)  TempSrc:    Oral  Resp: 19 18 19 19   Height:      Weight:    170 lb 13.7 oz (77.5 kg)  SpO2:       Incisions healing, no hematoma, foot well perfused  CBC    Component Value Date/Time   WBC 8.4 10/23/2012 0855   RBC 3.04* 10/23/2012 0855   HGB 9.2* 10/23/2012 0855   HCT 27.9* 10/23/2012 0855   PLT 177 10/23/2012 0855   MCV 91.8 10/23/2012 0855   MCH 30.3 10/23/2012 0855   MCHC 33.0 10/23/2012 0855   RDW 14.2 10/23/2012 0855     BMET    Component Value Date/Time   NA 134* 10/23/2012 0850   K 4.0 10/23/2012 0850   CL 90* 10/23/2012 0850   CO2 25 10/23/2012 0850   GLUCOSE 136* 10/23/2012 0850   BUN 42* 10/23/2012 0850   CREATININE 10.39* 10/23/2012 0850   CREATININE 9.25* 10/01/2012 1510   CALCIUM 10.4 10/23/2012 0850   GFRNONAA 4* 10/23/2012 0850   GFRAA 5* 10/23/2012 0850    Slowly improving  Most likely d/c home tomorrow Continue to ambulate  Fabienne Bruns, MD [facility] [place] Office: [phone] Pager: [phone]

## 2012-10-23 NOTE — Procedures (Signed)
I was present at this dialysis session. I have reviewed the session itself and made appropriate changes.   Rob Abdimalik Mayorquin, MD [facility] 10/23/2012, 8:51 AM   

## 2012-10-24 MED ORDER — OXYCODONE-ACETAMINOPHEN 5-325 MG PO TABS: 1.0000 | ORAL_TABLET | ORAL | Status: DC | PRN

## 2012-10-24 NOTE — Discharge Summary (Signed)
[facility] Discharge Summary   Patient ID:  Tim Kirby MRN: [id number] DOB/AGE: 1941/03/06 72 y.o.  Admit date: 10/18/2012 Discharge date: 10/24/2012 Date of Surgery: 10/18/2012 Surgeon: Surgeon(s): Fransisco Hertz, MD Chuck Hint, MD  Admission Diagnosis: Peripheral Vascular Disease Right Popliteal Aneurysm  Discharge Diagnoses:  Peripheral Vascular Disease Right Popliteal Aneurysm  Secondary Diagnoses: Past Medical History  Diagnosis Date  . Hyperlipidemia   . Gout   . Secondary hyperparathyroidism   . Anemia   . Colon, diverticulosis   . Thyroid nodule   . Anginal pain     sees Dr. Riley Kill  . Hypertension     sees Dr. Lajuana Carry  . Chronic kidney disease     Tues, thurs, sat  . Aneurysm artery, popliteal     Procedure(s): BYPASS GRAFT FEMORAL-POPLITEAL ARTERY  Discharged Condition: good  HPI: Tim Kirby is a 72 y.o. male who presents for postoperative follow-up from procedure on Date: 09/27/12: Aortogram, bilateral leg runoff. The patient denies any rest pain or claudication. He denies any ulcers or gangrene in either foot. The patient is in the process of cardiac work-up with Dr. Riley Kill. He denies any current chest pain or pressure    Hospital Course:  TYJAY Kirby is a 72 y.o. male is S/P Right Procedure(s): BYPASS GRAFT FEMORAL-POPLITEAL ARTERY Extubated: POD # 0 Post-op wounds healing well Pt. Ambulating, voiding and taking PO diet without difficulty. Pt pain controlled with PO pain meds. Labs as below Complications:none  Consults:  Treatment Team:  Maree Krabbe, MD  Significant Diagnostic Studies: CBC Lab Results  Component Value Date   WBC 8.4 10/23/2012   HGB 9.2* 10/23/2012   HCT 27.9* 10/23/2012   MCV 91.8 10/23/2012   PLT 177 10/23/2012    BMET    Component Value Date/Time   NA 134* 10/23/2012 0850   K 4.0 10/23/2012 0850   CL 90* 10/23/2012 0850   CO2 25 10/23/2012 0850   GLUCOSE 136* 10/23/2012  0850   BUN 42* 10/23/2012 0850   CREATININE 10.39* 10/23/2012 0850   CREATININE 9.25* 10/01/2012 1510   CALCIUM 10.4 10/23/2012 0850   GFRNONAA 4* 10/23/2012 0850   GFRAA 5* 10/23/2012 0850   COAG Lab Results  Component Value Date   INR 1.21 10/13/2012     Disposition:  Discharge to :Home Discharge Orders    Future Orders Please Complete By Expires   Resume previous diet      Driving Restrictions      Comments:   No driving for 2 weeks   Lifting restrictions      Comments:   No lifting for 6 weeks   Call MD for:  temperature >100.5      Call MD for:  redness, tenderness, or signs of infection (pain, swelling, bleeding, redness, odor or green/yellow discharge around incision site)      Call MD for:  severe or increased pain, loss or decreased feeling  in affected limb(s)         Banyan, [place]  Home Medication Instructions [id number]   Printed on:10/24/12 0747  Medication Information                    amLODipine (NORVASC) 10 MG tablet Take 10 mg by mouth daily.           b complex-vitamin c-folic acid (NEPHRO-VITE) 0.8 MG TABS Take 0.8 mg by mouth at bedtime.  simvastatin (ZOCOR) 40 MG tablet Take 40 mg by mouth every evening.           sevelamer (RENVELA) 800 MG tablet Take 3,200 mg by mouth 3 (three) times daily with meals.            aspirin 325 MG tablet Take 325 mg by mouth daily.           oxyCODONE-acetaminophen (PERCOCET/ROXICET) 5-325 MG per tablet Take 1-2 tablets by mouth every 4 (four) hours as needed.            Verbal and written Discharge instructions given to the patient. Wound care per Discharge AVS   Signed: Clinton Gallant [facility] 10/24/2012, 7:47 AM  Addendum  I have independently interviewed and examined the patient, and I agree with the physician assistant's discharge summary.  This patient has both bilateral iliofemoropopliteal aneurysmal disease and severe calcific atherosclerosis.  His right leg runoff is severely  compromised and he essentially had only a disease peroneal runoff.  The popliteal aneurysm on this side was heavily thrombosed and ~10 cm in size.  In order to try to salvage this leg, I recommended surgically excluding the femoropopliteal segment of this aneurysm and reconstructing the vasculature.  Intraoperatively, it turned out the only option was a common femoral to tibioperoneal trunk bypass with non-reverse ipsilateral greater saphenous vein.  The final outcome was better than expect but the patient is at significant risk of limb loss because the limited tibial outflow.  The left leg will need to be addressed in a month or two after the patient has adequately recovered from his first procedure.  Leonides Sake, MD [facility] [place] Office: [phone] Pager: [phone]  10/24/2012, 2:11 PM

## 2012-10-24 NOTE — Progress Notes (Addendum)
[facility] [place]  Progress Note Bypass Surgery  Date of Surgery: 10/18/2012  Procedure(s): BYPASS GRAFT FEMORAL-POPLITEAL ARTERY Surgeon: Surgeon(s): Fransisco Hertz, MD Chuck Hint, MD  6 Days Post-Op  History of Present Illness  Tim Kirby is a 72 y.o. male who is S/P Procedure(s): BYPASS GRAFT FEMORAL-POPLITEAL ARTERY right.  The patient's pre-op symptoms of pain are Improved . Patients pain is well controlled.       Imaging: No results found.  Significant Diagnostic Studies: CBC Lab Results  Component Value Date   WBC 8.4 10/23/2012   HGB 9.2* 10/23/2012   HCT 27.9* 10/23/2012   MCV 91.8 10/23/2012   PLT 177 10/23/2012    BMET     Component Value Date/Time   NA 134* 10/23/2012 0850   K 4.0 10/23/2012 0850   CL 90* 10/23/2012 0850   CO2 25 10/23/2012 0850   GLUCOSE 136* 10/23/2012 0850   BUN 42* 10/23/2012 0850   CREATININE 10.39* 10/23/2012 0850   CREATININE 9.25* 10/01/2012 1510   CALCIUM 10.4 10/23/2012 0850   GFRNONAA 4* 10/23/2012 0850   GFRAA 5* 10/23/2012 0850    COAG Lab Results  Component Value Date   INR 1.21 10/13/2012   No results found for this basename: PTT    Physical Examination  BP Readings from Last 3 Encounters:  10/24/12 147/82  10/24/12 147/82  10/13/12 151/81   Temp Readings from Last 3 Encounters:  10/24/12 98.4 F (36.9 C) Oral  10/24/12 98.4 F (36.9 C) Oral  10/13/12 97.7 F (36.5 C)    SpO2 Readings from Last 3 Encounters:  10/24/12 97%  10/24/12 97%  10/13/12 95%   Pulse Readings from Last 3 Encounters:  10/24/12 71  10/24/12 71  10/13/12 68    Pt is A&O x 3 right lower extremity: Incision/s is/are clean,dry.intact, and  healing without hematoma, erythema or drainage Limb is warm; with good color    Assessment/Plan: Pt. Doing well Post-op pain is controlled Wounds are healing well PT/OT for ambulation   Mosetta Pigeon [phone] 10/24/2012 7:43 AM   Viable  foot incisions healing Will d/c home  Fabienne Bruns, MD [facility] [place] Office: [phone] Pager: [phone]

## 2012-10-24 NOTE — Progress Notes (Signed)
Discharged to home with family office visits in place teaching done  

## 2012-10-25 ENCOUNTER — Telehealth: Payer: Self-pay | Admitting: Vascular Surgery

## 2012-10-25 NOTE — Telephone Encounter (Signed)
Message copied by Fredrich Birks on Mon Oct 25, 2012 11:08 AM ------      Message from: Melene Plan      Created: Mon Oct 25, 2012  9:43 AM                   ----- Message -----         From: Lars Mage, PA         Sent: 10/24/2012   7:46 AM           To: Melene Plan, RN            F/U in 2 weeks from surgery for staple removal s/p right fem-pop bypass with Dr. Imogene Burn thanks mc

## 2012-10-25 NOTE — Telephone Encounter (Signed)
Spoke with wife re: follow up with [facility] post surgery, dpm

## 2012-11-11 ENCOUNTER — Encounter: Payer: Self-pay | Admitting: Vascular Surgery

## 2012-11-12 ENCOUNTER — Ambulatory Visit (INDEPENDENT_AMBULATORY_CARE_PROVIDER_SITE_OTHER): Payer: Medicare Other | Admitting: Vascular Surgery

## 2012-11-12 ENCOUNTER — Encounter: Payer: Self-pay | Admitting: Vascular Surgery

## 2012-11-12 VITALS — BP 143/81 | HR 78 | Resp 20 | Ht 68.0 in | Wt 180.0 lb

## 2012-11-12 DIAGNOSIS — I723 Aneurysm of iliac artery: Secondary | ICD-10-CM | POA: Insufficient documentation

## 2012-11-12 DIAGNOSIS — I739 Peripheral vascular disease, unspecified: Secondary | ICD-10-CM | POA: Insufficient documentation

## 2012-11-12 DIAGNOSIS — I724 Aneurysm of artery of lower extremity: Secondary | ICD-10-CM

## 2012-11-12 NOTE — Progress Notes (Signed)
[facility] [place]  Postoperative Visit  History of Present Illness  Tim Kirby is a 72 y.o. year old male who presents for postoperative follow-up for: [place] fem-pop aneurysm, R CFA to BK pop with NR GSV (Date: 10/18/12).  The patient's wounds are healed.  The patient notes no claudication symptoms.  The patient is able to complete their activities of daily living.  The patient's current symptoms are: none.  The patient has had significant improvement in swelling in the right leg.    Physical Examination  Filed Vitals:   11/12/12 1246  BP: 143/81  Pulse: 78  Resp: 20   RLE: Incisions are healed, wounds are healed, pedal pulses are not palpable  Medical Decision Making  [name] is a 72 y.o. year old male who presents s/p R femoropop aneurysm exclusion and R fem-BK pop bypass, known B CFA femoral aneurysms, and L femoropopliteal aneurysm in setting of moderate to severe peripheral arterial disease The patient's bypass incisions are healing appropriately. The patient would like to wait another month before considering intervention on L fem-pop aneurysms.  I will need to review his films to see if he is a candidate for endovascular exclusion of that aneurysm given the likely severe calcification present in L popliteal artery also.  The R popliteal artery was nearly impossible to bypass to. This patient also has significant iliac and common femoral artery aneurysms that will need continued surveillance. I discussed in depth with the patient the nature of atherosclerosis, and emphasized the importance of maximal medical management including strict control of blood pressure, blood glucose, and lipid levels, obtaining regular exercise, and cessation of smoking.  The patient is aware that without maximal medical management the underlying atherosclerotic disease process will progress, limiting the benefit of any interventions. I emphasized the importance of routine  surveillance of the patient's bypass, as the vascular surgery literature emphasize the improved patency possible with assisted primary patency procedures versus secondary patency procedures. The patient agrees to participate in their maximal medical care and routine surveillance.  Thank you for allowing [place] to participate in this patient's care.  Leonides Sake, MD [facility] [place] Office: [phone] Pager: [phone]

## 2012-11-24 ENCOUNTER — Encounter: Payer: Self-pay | Admitting: *Deleted

## 2012-11-26 ENCOUNTER — Telehealth: Payer: Self-pay

## 2012-11-26 NOTE — Telephone Encounter (Signed)
Wife called to request a note be sent to pt's employer to resume driving after surgery.  Discussed with Dr. Imogene Burn.  Rec'd. verbal order that it is okay for pt. to resume driving.

## 2012-12-09 ENCOUNTER — Encounter: Payer: Self-pay | Admitting: Vascular Surgery

## 2012-12-10 ENCOUNTER — Ambulatory Visit: Payer: Medicare Other | Admitting: Vascular Surgery

## 2012-12-15 ENCOUNTER — Encounter: Payer: Self-pay | Admitting: Vascular Surgery

## 2012-12-16 ENCOUNTER — Encounter: Payer: Self-pay | Admitting: Vascular Surgery

## 2012-12-16 ENCOUNTER — Other Ambulatory Visit: Payer: Self-pay | Admitting: *Deleted

## 2012-12-16 ENCOUNTER — Encounter: Payer: Self-pay | Admitting: *Deleted

## 2012-12-16 ENCOUNTER — Ambulatory Visit (INDEPENDENT_AMBULATORY_CARE_PROVIDER_SITE_OTHER): Payer: Medicare Other | Admitting: Vascular Surgery

## 2012-12-16 VITALS — BP 140/80 | HR 73 | Ht 68.0 in | Wt 189.9 lb

## 2012-12-16 DIAGNOSIS — I724 Aneurysm of artery of lower extremity: Secondary | ICD-10-CM

## 2012-12-16 DIAGNOSIS — I739 Peripheral vascular disease, unspecified: Secondary | ICD-10-CM

## 2012-12-17 ENCOUNTER — Encounter: Payer: Self-pay | Admitting: Vascular Surgery

## 2012-12-17 NOTE — Progress Notes (Signed)
[facility] [place]  Established Previous Bypass  History of Present Illness  Tim Kirby is a 72 y.o. (05-04-1941) male who presents with chief complaint: follow up for L leg.  Previous operation(s) include: 1. Right common femoral artery to below-the-knee popliteal bypass with non-reverse saphenous vein 2. Ligation of femoropopliteal aneurysm  (Date: 10/18/12).  The patient's symptoms have not progressed.  The patient's symptoms are: right leg swelling. The patient's treatment regimen currently included: maximal medical management.  Past Medical History, Past Surgical History, Social History, Family History, Medications, Allergies, and Review of Systems are unchanged from previous evaluation on 11/12/12.  Physical Examination  Filed Vitals:   12/16/12 0845  BP: 140/80  Pulse: 73  Height: 5\' 8"  (1.727 m)  Weight: 189 lb 14.4 oz (86.138 kg)  SpO2: 98%   Body mass index is 28.88 kg/(m^2).  General: A&O x 3, WDWN  Pulmonary: Sym exp, good air movt, CTAB, no rales, rhonchi, & wheezing  Cardiac: RRR, Nl S1, S2, no Murmurs, rubs or gallops  Vascular: Vessel Right Left  Radial Palpable Palpable  Brachial Palpable Palpable  Carotid Palpable, without bruit Palpable, without bruit  Aorta Non-palpable N/A  Femoral Palpable Palpable  Popliteal Non-palpable Non-palpable  PT Not Palpable Not Palpable  DP Not Palpable Not Palpable   Musculoskeletal: M/S 5/5 throughout , Extremities without ischemic changes , incisions in R leg all healed, 1-2+ edema in R lower leg, palpable aneurysm in L distal thigh  Neurologic: Pain and light touch intact in extremities , Motor exam as listed above   Medical Decision Making  Tim Kirby is a 72 y.o. male who presents with: s/p R femoropopliteal aneurysm exclusion and R fem-BK pop BPG, L femoropopliteal aneurysm, B iliac aneurysm .  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  approach to the left popliteal aneurysm.  There is not enough dilation to force any type of intervention on the left femoral segment at this point.  It appears on the CTA that there is enough landing zone distally to place a large Viabahn stent.  The large stent will require a cutdown on the left femoral artery.  If the endovascular approach fails, we will proceed with an open approach.  I recommend: left femoral cutdown, left leg angiogram, possible placement of Viabahn stent, possible exclusion of femoropopiteal aneurysm, left femoral to below-the-knee popliteal artery bypass.  I discussed in depth with the patient the nature of atherosclerosis, and emphasized the importance of maximal medical management including strict control of blood pressure, blood glucose, and lipid levels, obtaining regular exercise, and cessation of smoking.  The patient is aware that without maximal medical management the underlying atherosclerotic disease process will progress, limiting the benefit of any interventions.  Thank you for allowing [place] to participate in this patient's care.  Leonides Sake, MD [facility] [place] Office: [phone] Pager: [phone]  12/17/2012, 9:03 AM

## 2012-12-21 ENCOUNTER — Encounter (HOSPITAL_COMMUNITY): Payer: Self-pay | Admitting: Pharmacy Technician

## 2012-12-21 NOTE — Pre-Procedure Instructions (Signed)
TALLIN HART  12/21/2012   Your procedure is scheduled on:  Wednesday, March 26th  Report to [facility] at 0630 AM.  Call this number if you have problems the morning of surgery: [phone]   Remember:   Do not eat food or drink liquids after midnight.    Take these medicines the morning of surgery with A SIP OF WATER: norvasc   Do not wear jewelry.  Do not wear lotions, powders, or perfumes, deodorant.  Do not shave 48 hours prior to surgery. Men may shave face and neck.  Do not bring valuables to the hospital.  Contacts, dentures or bridgework may not be worn into surgery.  Leave suitcase in the car. After surgery it may be brought to your room.  For patients admitted to the hospital, checkout time is 11:00 AM the day of discharge.   Patients discharged the day of surgery will not be allowed to drive home.    Special Instructions: Shower using CHG 2 nights before surgery and the night before surgery.  If you shower the day of surgery use CHG.  Use special wash - you have one bottle of CHG for all showers.  You should use approximately 1/3 of the bottle for each shower.   Please read over the following fact sheets that you were given: Pain Booklet, Coughing and Deep Breathing, Blood Transfusion Information, MRSA Information and Surgical Site Infection Prevention

## 2012-12-22 ENCOUNTER — Encounter (HOSPITAL_COMMUNITY)
Admission: RE | Admit: 2012-12-22 | Discharge: 2012-12-22 | Disposition: A | Discharge status: Home / Self Care | Payer: Medicare Other | Source: Ambulatory Visit | Attending: Vascular Surgery | Admitting: Vascular Surgery

## 2012-12-22 ENCOUNTER — Encounter (HOSPITAL_COMMUNITY): Payer: Self-pay

## 2012-12-22 LAB — URINALYSIS, ROUTINE W REFLEX MICROSCOPIC
Bilirubin Urine: NEGATIVE
Leukocytes, UA: NEGATIVE
Nitrite: NEGATIVE
Specific Gravity, Urine: 1.021 (ref 1.005–1.030)
Urobilinogen, UA: 0.2 mg/dL (ref 0.0–1.0)
pH: 6.5 (ref 5.0–8.0)

## 2012-12-22 LAB — COMPREHENSIVE METABOLIC PANEL
AST: 14 U/L (ref 0–37)
Albumin: 4.1 g/dL (ref 3.5–5.2)
BUN: 26 mg/dL — ABNORMAL HIGH (ref 6–23)
Calcium: 10.7 mg/dL — ABNORMAL HIGH (ref 8.4–10.5)
Chloride: 93 mEq/L — ABNORMAL LOW (ref 96–112)
Creatinine, Ser: 7.71 mg/dL — ABNORMAL HIGH (ref 0.50–1.35)
Total Bilirubin: 0.3 mg/dL (ref 0.3–1.2)
Total Protein: 7.8 g/dL (ref 6.0–8.3)

## 2012-12-22 LAB — CBC
HCT: 38.7 % — ABNORMAL LOW (ref 39.0–52.0)
MCH: 28.9 pg (ref 26.0–34.0)
MCHC: 31.5 g/dL (ref 30.0–36.0)
MCV: 91.7 fL (ref 78.0–100.0)
Platelets: 273 10*3/uL (ref 150–400)
RDW: 14.3 % (ref 11.5–15.5)
WBC: 7 10*3/uL (ref 4.0–10.5)

## 2012-12-22 LAB — PROTIME-INR
INR: 1.09 (ref 0.00–1.49)
Prothrombin Time: 14 seconds (ref 11.6–15.2)

## 2012-12-22 LAB — SURGICAL PCR SCREEN: MRSA, PCR: NEGATIVE

## 2012-12-22 LAB — URINE MICROSCOPIC-ADD ON

## 2012-12-22 NOTE — Progress Notes (Addendum)
Primary Physician - Dr. Hyman Hopes Does not have a cardiologist - only saw [facility] for cardiac clearance did not have to return to see. Stress, echo,ekg from jan2014 in epic  Type and screen to be drawn day of surgery due to receiving blood and having surgery within last 3 months

## 2012-12-23 LAB — URINE CULTURE
Colony Count: NO GROWTH
Culture: NO GROWTH

## 2012-12-24 ENCOUNTER — Ambulatory Visit: Payer: Medicare Other | Admitting: Vascular Surgery

## 2012-12-28 MED ORDER — CEFUROXIME SODIUM 1.5 G IJ SOLR
1.5000 g | INTRAMUSCULAR | Status: AC
  Administered 2012-12-29: 1.5 g via INTRAVENOUS
  Filled 2012-12-28: qty 1.5

## 2012-12-28 MED ORDER — SODIUM CHLORIDE 0.9 % IV SOLN: INTRAVENOUS | Status: DC

## 2012-12-29 ENCOUNTER — Encounter (HOSPITAL_COMMUNITY): Payer: Self-pay

## 2012-12-29 ENCOUNTER — Encounter (HOSPITAL_COMMUNITY)
Admission: RE | Disposition: A | Discharge status: Home / Self Care | Payer: Self-pay | Source: Ambulatory Visit | Attending: Vascular Surgery

## 2012-12-29 ENCOUNTER — Inpatient Hospital Stay (HOSPITAL_COMMUNITY)
Admission: RE | Admit: 2012-12-29 | Discharge: 2012-12-30 | DRG: 252 | Disposition: A | Discharge status: Home / Self Care | Payer: Medicare Other | Source: Ambulatory Visit | Attending: Vascular Surgery | Admitting: Vascular Surgery

## 2012-12-29 ENCOUNTER — Encounter (HOSPITAL_COMMUNITY): Payer: Self-pay | Admitting: Vascular Surgery

## 2012-12-29 ENCOUNTER — Inpatient Hospital Stay (HOSPITAL_COMMUNITY): Payer: Medicare Other | Admitting: Anesthesiology

## 2012-12-29 DIAGNOSIS — N2581 Secondary hyperparathyroidism of renal origin: Secondary | ICD-10-CM | POA: Diagnosis present

## 2012-12-29 DIAGNOSIS — E875 Hyperkalemia: Secondary | ICD-10-CM | POA: Diagnosis not present

## 2012-12-29 DIAGNOSIS — Z79899 Other long term (current) drug therapy: Secondary | ICD-10-CM

## 2012-12-29 DIAGNOSIS — D62 Acute posthemorrhagic anemia: Secondary | ICD-10-CM | POA: Diagnosis not present

## 2012-12-29 DIAGNOSIS — N186 End stage renal disease: Secondary | ICD-10-CM | POA: Diagnosis present

## 2012-12-29 DIAGNOSIS — Z01812 Encounter for preprocedural laboratory examination: Secondary | ICD-10-CM

## 2012-12-29 DIAGNOSIS — M109 Gout, unspecified: Secondary | ICD-10-CM | POA: Diagnosis present

## 2012-12-29 DIAGNOSIS — Z87891 Personal history of nicotine dependence: Secondary | ICD-10-CM

## 2012-12-29 DIAGNOSIS — I120 Hypertensive chronic kidney disease with stage 5 chronic kidney disease or end stage renal disease: Secondary | ICD-10-CM | POA: Diagnosis present

## 2012-12-29 DIAGNOSIS — K5730 Diverticulosis of large intestine without perforation or abscess without bleeding: Secondary | ICD-10-CM | POA: Diagnosis present

## 2012-12-29 DIAGNOSIS — E785 Hyperlipidemia, unspecified: Secondary | ICD-10-CM | POA: Diagnosis present

## 2012-12-29 DIAGNOSIS — I70219 Atherosclerosis of native arteries of extremities with intermittent claudication, unspecified extremity: Secondary | ICD-10-CM

## 2012-12-29 DIAGNOSIS — I724 Aneurysm of artery of lower extremity: Principal | ICD-10-CM | POA: Diagnosis present

## 2012-12-29 HISTORY — PX: ENDOVASCULAR REPAIR OF POPLITEAL ARTERY ANEURYSM: SHX5811

## 2012-12-29 HISTORY — PX: FALSE ANEURYSM REPAIR: SHX5152

## 2012-12-29 LAB — CBC
HCT: 35.7 % — ABNORMAL LOW (ref 39.0–52.0)
MCHC: 31.7 g/dL (ref 30.0–36.0)
RDW: 14.4 % (ref 11.5–15.5)

## 2012-12-29 LAB — POCT I-STAT 4, (NA,K, GLUC, HGB,HCT)
Potassium: 4.5 mEq/L (ref 3.5–5.1)
Sodium: 139 mEq/L (ref 135–145)

## 2012-12-29 LAB — CREATININE, SERUM
Creatinine, Ser: 8.6 mg/dL — ABNORMAL HIGH (ref 0.50–1.35)
GFR calc non Af Amer: 5 mL/min — ABNORMAL LOW (ref >90)

## 2012-12-29 LAB — TYPE AND SCREEN: Antibody Screen: NEGATIVE

## 2012-12-29 SURGERY — REPAIR, PSEUDOANEURYSM
Anesthesia: General | Site: Leg Upper | Laterality: Left | Wound class: Clean

## 2012-12-29 MED ORDER — SIMVASTATIN 40 MG PO TABS
40.0000 mg | ORAL_TABLET | Freq: Every evening | ORAL | Status: DC
  Administered 2012-12-29: 40 mg via ORAL
  Filled 2012-12-29 (×2): qty 1

## 2012-12-29 MED ORDER — THROMBIN 20000 UNITS EX SOLR
CUTANEOUS | Status: AC
  Filled 2012-12-29: qty 20000

## 2012-12-29 MED ORDER — ALBUMIN HUMAN 5 % IV SOLN
INTRAVENOUS | Status: DC | PRN
  Administered 2012-12-29: 11:00:00 via INTRAVENOUS

## 2012-12-29 MED ORDER — SODIUM CHLORIDE 0.9 % IV SOLN: 500.0000 mL | Freq: Once | INTRAVENOUS | Status: AC | PRN

## 2012-12-29 MED ORDER — CLOPIDOGREL BISULFATE 75 MG PO TABS
75.0000 mg | ORAL_TABLET | Freq: Every day | ORAL | Status: DC
  Administered 2012-12-30: 75 mg via ORAL
  Filled 2012-12-29 (×2): qty 1

## 2012-12-29 MED ORDER — KCL IN DEXTROSE-NACL 20-5-0.45 MEQ/L-%-% IV SOLN
INTRAVENOUS | Status: DC
  Administered 2012-12-29 (×2): via INTRAVENOUS
  Filled 2012-12-29 (×6): qty 1000

## 2012-12-29 MED ORDER — ENOXAPARIN SODIUM 30 MG/0.3ML ~~LOC~~ SOLN
30.0000 mg | SUBCUTANEOUS | Status: DC
  Administered 2012-12-30: 30 mg via SUBCUTANEOUS
  Filled 2012-12-29 (×2): qty 0.3

## 2012-12-29 MED ORDER — OXYCODONE HCL 5 MG PO TABS
5.0000 mg | ORAL_TABLET | ORAL | Status: DC | PRN
Start: 2012-12-29 — End: 2012-12-30
  Administered 2012-12-29 – 2012-12-30 (×3): 10 mg via ORAL
  Filled 2012-12-29 (×3): qty 2

## 2012-12-29 MED ORDER — ASPIRIN 325 MG PO TABS
325.0000 mg | ORAL_TABLET | Freq: Every day | ORAL | Status: DC
  Administered 2012-12-29 – 2012-12-30 (×2): 325 mg via ORAL
  Filled 2012-12-29 (×2): qty 1

## 2012-12-29 MED ORDER — CLOPIDOGREL BISULFATE 300 MG PO TABS
300.0000 mg | ORAL_TABLET | Freq: Once | ORAL | Status: AC
  Administered 2012-12-29: 300 mg via ORAL
  Filled 2012-12-29: qty 1

## 2012-12-29 MED ORDER — MORPHINE SULFATE 4 MG/ML IJ SOLN
INTRAMUSCULAR | Status: AC
  Administered 2012-12-29: 4 mg
  Filled 2012-12-29: qty 1

## 2012-12-29 MED ORDER — DEXTROSE 5 % IV SOLN: 1.5000 g | Freq: Two times a day (BID) | INTRAVENOUS | Status: DC

## 2012-12-29 MED ORDER — ONDANSETRON HCL 4 MG/2ML IJ SOLN
INTRAMUSCULAR | Status: DC | PRN
  Administered 2012-12-29: 4 mg via INTRAVENOUS

## 2012-12-29 MED ORDER — PANTOPRAZOLE SODIUM 40 MG PO TBEC
40.0000 mg | DELAYED_RELEASE_TABLET | Freq: Every day | ORAL | Status: DC
  Administered 2012-12-29 – 2012-12-30 (×2): 40 mg via ORAL
  Filled 2012-12-29: qty 1

## 2012-12-29 MED ORDER — HYDRALAZINE HCL 20 MG/ML IJ SOLN: 10.0000 mg | INTRAMUSCULAR | Status: DC | PRN

## 2012-12-29 MED ORDER — ONDANSETRON HCL 4 MG/2ML IJ SOLN: 4.0000 mg | Freq: Four times a day (QID) | INTRAMUSCULAR | Status: DC | PRN

## 2012-12-29 MED ORDER — ACETAMINOPHEN 650 MG RE SUPP: 325.0000 mg | RECTAL | Status: DC | PRN

## 2012-12-29 MED ORDER — DOCUSATE SODIUM 100 MG PO CAPS
100.0000 mg | ORAL_CAPSULE | Freq: Every day | ORAL | Status: DC
  Filled 2012-12-29: qty 1

## 2012-12-29 MED ORDER — SODIUM CHLORIDE 0.9 % IV SOLN
INTRAVENOUS | Status: DC | PRN
  Administered 2012-12-29 (×2): via INTRAVENOUS

## 2012-12-29 MED ORDER — LABETALOL HCL 5 MG/ML IV SOLN: 10.0000 mg | INTRAVENOUS | Status: DC | PRN

## 2012-12-29 MED ORDER — GLYCOPYRROLATE 0.2 MG/ML IJ SOLN
INTRAMUSCULAR | Status: DC | PRN
  Administered 2012-12-29: .6 mg via INTRAVENOUS

## 2012-12-29 MED ORDER — SODIUM CHLORIDE 0.9 % IV SOLN
10.0000 mg | INTRAVENOUS | Status: DC | PRN
  Administered 2012-12-29: 15 ug/min via INTRAVENOUS

## 2012-12-29 MED ORDER — DEXTROSE 5 % IV SOLN
1.5000 g | Freq: Once | INTRAVENOUS | Status: AC
  Administered 2012-12-30: 1.5 g via INTRAVENOUS
  Filled 2012-12-29: qty 1.5

## 2012-12-29 MED ORDER — ARTIFICIAL TEARS OP OINT
TOPICAL_OINTMENT | OPHTHALMIC | Status: DC | PRN
  Administered 2012-12-29: 1 via OPHTHALMIC

## 2012-12-29 MED ORDER — LIDOCAINE HCL (CARDIAC) 20 MG/ML IV SOLN
INTRAVENOUS | Status: DC | PRN
  Administered 2012-12-29: 100 mg via INTRAVENOUS

## 2012-12-29 MED ORDER — PROPOFOL 10 MG/ML IV BOLUS
INTRAVENOUS | Status: DC | PRN
  Administered 2012-12-29: 200 mg via INTRAVENOUS

## 2012-12-29 MED ORDER — 0.9 % SODIUM CHLORIDE (POUR BTL) OPTIME
TOPICAL | Status: DC | PRN
  Administered 2012-12-29: 1000 mL
  Administered 2012-12-29: 2000 mL

## 2012-12-29 MED ORDER — DOPAMINE-DEXTROSE 3.2-5 MG/ML-% IV SOLN: 3.0000 ug/kg/min | INTRAVENOUS | Status: DC

## 2012-12-29 MED ORDER — SEVELAMER CARBONATE 800 MG PO TABS
3200.0000 mg | ORAL_TABLET | Freq: Three times a day (TID) | ORAL | Status: DC
  Administered 2012-12-30: 3200 mg via ORAL
  Filled 2012-12-29 (×5): qty 4

## 2012-12-29 MED ORDER — MAGNESIUM SULFATE 40 MG/ML IJ SOLN
2.0000 g | Freq: Once | INTRAMUSCULAR | Status: AC | PRN
  Filled 2012-12-29: qty 50

## 2012-12-29 MED ORDER — HEPARIN SODIUM (PORCINE) 1000 UNIT/ML IJ SOLN
INTRAMUSCULAR | Status: DC | PRN
  Administered 2012-12-29: 6500 [IU] via INTRAVENOUS

## 2012-12-29 MED ORDER — THROMBIN 20000 UNITS EX SOLR
CUTANEOUS | Status: DC | PRN
  Administered 2012-12-29: 12:00:00 via TOPICAL

## 2012-12-29 MED ORDER — ALUM & MAG HYDROXIDE-SIMETH 200-200-20 MG/5ML PO SUSP: 15.0000 mL | ORAL | Status: DC | PRN

## 2012-12-29 MED ORDER — HYDROMORPHONE HCL PF 1 MG/ML IJ SOLN: 0.2500 mg | INTRAMUSCULAR | Status: DC | PRN

## 2012-12-29 MED ORDER — VECURONIUM BROMIDE 10 MG IV SOLR
INTRAVENOUS | Status: DC | PRN
  Administered 2012-12-29: 4 mg via INTRAVENOUS
  Administered 2012-12-29 (×3): 2 mg via INTRAVENOUS

## 2012-12-29 MED ORDER — MORPHINE SULFATE 2 MG/ML IJ SOLN
2.0000 mg | INTRAMUSCULAR | Status: DC | PRN
  Administered 2012-12-29: 4 mg via INTRAVENOUS
  Administered 2012-12-30: 2 mg via INTRAVENOUS
  Filled 2012-12-29: qty 1

## 2012-12-29 MED ORDER — POTASSIUM CHLORIDE CRYS ER 20 MEQ PO TBCR: 20.0000 meq | EXTENDED_RELEASE_TABLET | Freq: Once | ORAL | Status: AC | PRN

## 2012-12-29 MED ORDER — IODIXANOL 320 MG/ML IV SOLN
INTRAVENOUS | Status: DC | PRN
  Administered 2012-12-29: 150 mL via INTRA_ARTERIAL

## 2012-12-29 MED ORDER — BISACODYL 5 MG PO TBEC: 5.0000 mg | DELAYED_RELEASE_TABLET | Freq: Every day | ORAL | Status: DC | PRN

## 2012-12-29 MED ORDER — SODIUM CHLORIDE 0.9 % IR SOLN
Status: DC | PRN
  Administered 2012-12-29 (×2)

## 2012-12-29 MED ORDER — FENTANYL CITRATE 0.05 MG/ML IJ SOLN
INTRAMUSCULAR | Status: DC | PRN
  Administered 2012-12-29: 50 ug via INTRAVENOUS
  Administered 2012-12-29: 150 ug via INTRAVENOUS
  Administered 2012-12-29: 50 ug via INTRAVENOUS

## 2012-12-29 MED ORDER — ONDANSETRON HCL 4 MG/2ML IJ SOLN: 4.0000 mg | Freq: Once | INTRAMUSCULAR | Status: DC | PRN

## 2012-12-29 MED ORDER — SENNOSIDES-DOCUSATE SODIUM 8.6-50 MG PO TABS
1.0000 | ORAL_TABLET | Freq: Every evening | ORAL | Status: DC | PRN
  Filled 2012-12-29: qty 1

## 2012-12-29 MED ORDER — ACETAMINOPHEN 325 MG PO TABS: 325.0000 mg | ORAL_TABLET | ORAL | Status: DC | PRN

## 2012-12-29 MED ORDER — PHENOL 1.4 % MT LIQD: 1.0000 | OROMUCOSAL | Status: DC | PRN

## 2012-12-29 MED ORDER — RENA-VITE PO TABS
1.0000 | ORAL_TABLET | Freq: Every day | ORAL | Status: DC
  Administered 2012-12-29 – 2012-12-30 (×2): 1 via ORAL
  Filled 2012-12-29 (×2): qty 1

## 2012-12-29 MED ORDER — GUAIFENESIN-DM 100-10 MG/5ML PO SYRP: 15.0000 mL | ORAL_SOLUTION | ORAL | Status: DC | PRN

## 2012-12-29 MED ORDER — AMLODIPINE BESYLATE 10 MG PO TABS
10.0000 mg | ORAL_TABLET | Freq: Every day | ORAL | Status: DC
  Administered 2012-12-29 – 2012-12-30 (×2): 10 mg via ORAL
  Filled 2012-12-29 (×2): qty 1

## 2012-12-29 MED ORDER — NEOSTIGMINE METHYLSULFATE 1 MG/ML IJ SOLN
INTRAMUSCULAR | Status: DC | PRN
  Administered 2012-12-29: 3 mg via INTRAVENOUS

## 2012-12-29 MED ORDER — METOPROLOL TARTRATE 1 MG/ML IV SOLN: 2.0000 mg | INTRAVENOUS | Status: DC | PRN

## 2012-12-29 SURGICAL SUPPLY — 93 items
BAG BANDED W/RUBBER/TAPE 36X54 (MISCELLANEOUS) ×3 IMPLANT
BAG DECANTER FOR FLEXI CONT (MISCELLANEOUS) ×3 IMPLANT
BAG SNAP BAND KOVER 36X36 (MISCELLANEOUS) ×3 IMPLANT
BANDAGE ELASTIC 4 VELCRO ST LF (GAUZE/BANDAGES/DRESSINGS) IMPLANT
BANDAGE ESMARK 6X9 LF (GAUZE/BANDAGES/DRESSINGS) IMPLANT
BLADE SURG ROTATE 9660 (MISCELLANEOUS) ×3 IMPLANT
BNDG ESMARK 6X9 LF (GAUZE/BANDAGES/DRESSINGS)
CANISTER SUCTION 2500CC (MISCELLANEOUS) ×3 IMPLANT
CATH ACCU-VU SIZ PIG 5F 70CM (CATHETERS) ×3 IMPLANT
CATH BEACON 5.038 65CM KMP-01 (CATHETERS) ×3 IMPLANT
CATH STRAIGHT 5FR 65CM (CATHETERS) ×3 IMPLANT
CATH VISIONS PV 0.018 (CATHETERS) ×3 IMPLANT
CLIP TI MEDIUM 24 (CLIP) ×3 IMPLANT
CLIP TI WIDE RED SMALL 24 (CLIP) ×3 IMPLANT
CLOTH BEACON ORANGE TIMEOUT ST (SAFETY) ×3 IMPLANT
COVER DOME SNAP 22 D (MISCELLANEOUS) ×3 IMPLANT
COVER PROBE W GEL 5X96 (DRAPES) IMPLANT
COVER SURGICAL LIGHT HANDLE (MISCELLANEOUS) ×3 IMPLANT
CUFF TOURNIQUET SINGLE 18IN (TOURNIQUET CUFF) IMPLANT
CUFF TOURNIQUET SINGLE 24IN (TOURNIQUET CUFF) IMPLANT
CUFF TOURNIQUET SINGLE 34IN LL (TOURNIQUET CUFF) IMPLANT
CUFF TOURNIQUET SINGLE 44IN (TOURNIQUET CUFF) IMPLANT
DERMABOND ADVANCED (GAUZE/BANDAGES/DRESSINGS) ×1
DERMABOND ADVANCED .7 DNX12 (GAUZE/BANDAGES/DRESSINGS) ×2 IMPLANT
DRAIN CHANNEL 15F RND FF W/TCR (WOUND CARE) IMPLANT
DRAPE C-ARM 42X72 X-RAY (DRAPES) IMPLANT
DRAPE SURG ISO 125X83 STRL (DRAPES) ×3 IMPLANT
DRAPE WARM FLUID 44X44 (DRAPE) ×3 IMPLANT
DRSG COVADERM 4X10 (GAUZE/BANDAGES/DRESSINGS) IMPLANT
DRSG COVADERM 4X8 (GAUZE/BANDAGES/DRESSINGS) IMPLANT
ELECT REM PT RETURN 9FT ADLT (ELECTROSURGICAL) ×3
ELECTRODE REM PT RTRN 9FT ADLT (ELECTROSURGICAL) ×2 IMPLANT
EVACUATOR SILICONE 100CC (DRAIN) IMPLANT
GAUZE SPONGE 4X4 16PLY XRAY LF (GAUZE/BANDAGES/DRESSINGS) ×6 IMPLANT
GLOVE BIO SURGEON STRL SZ7 (GLOVE) ×9 IMPLANT
GLOVE BIOGEL PI IND STRL 6.5 (GLOVE) ×4 IMPLANT
GLOVE BIOGEL PI IND STRL 7.0 (GLOVE) ×4 IMPLANT
GLOVE BIOGEL PI IND STRL 7.5 (GLOVE) ×6 IMPLANT
GLOVE BIOGEL PI INDICATOR 6.5 (GLOVE) ×2
GLOVE BIOGEL PI INDICATOR 7.0 (GLOVE) ×2
GLOVE BIOGEL PI INDICATOR 7.5 (GLOVE) ×3
GLOVE ECLIPSE 7.0 STRL STRAW (GLOVE) ×6 IMPLANT
GLOVE ECLIPSE 7.5 STRL STRAW (GLOVE) ×6 IMPLANT
GOWN STRL NON-REIN LRG LVL3 (GOWN DISPOSABLE) ×21 IMPLANT
INSERT FOGARTY SM (MISCELLANEOUS) IMPLANT
INTRODUCER PERFORM 12 30 .038 (SHEATH) ×3 IMPLANT
KIT BASIN OR (CUSTOM PROCEDURE TRAY) ×3 IMPLANT
KIT ENCORE 26 ADVANTAGE (KITS) ×3 IMPLANT
KIT ROOM TURNOVER OR (KITS) ×3 IMPLANT
MARKER GRAFT CORONARY BYPASS (MISCELLANEOUS) IMPLANT
NS IRRIG 1000ML POUR BTL (IV SOLUTION) ×9 IMPLANT
PACK PERIPHERAL VASCULAR (CUSTOM PROCEDURE TRAY) ×3 IMPLANT
PAD ARMBOARD 7.5X6 YLW CONV (MISCELLANEOUS) ×6 IMPLANT
PADDING CAST COTTON 6X4 STRL (CAST SUPPLIES) IMPLANT
SHEATH AVANTI 11CM 5FR (MISCELLANEOUS) ×6 IMPLANT
SLEEVE SURGEON STRL (DRAPES) ×3 IMPLANT
SPONGE GAUZE 4X4 12PLY (GAUZE/BANDAGES/DRESSINGS) ×3 IMPLANT
SPONGE SURGIFOAM ABS GEL 100 (HEMOSTASIS) IMPLANT
STAPLER VISISTAT 35W (STAPLE) IMPLANT
STENT VIABAHN 10X100X120 (Permanent Stent) ×3 IMPLANT
STENT VIABAHN 10X120X8X (Permanent Stent) ×2 IMPLANT
STENT VIABAHN 11X100X120 (Permanent Stent) ×3 IMPLANT
STENT VIABAHN 8X100X120 (Permanent Stent) ×1 IMPLANT
STOPCOCK 4 WAY LG BORE MALE ST (IV SETS) IMPLANT
STOPCOCK MORSE 400PSI 3WAY (MISCELLANEOUS) ×6 IMPLANT
SUT ETHILON 3 0 PS 1 (SUTURE) IMPLANT
SUT GORETEX 5 0 TT13 24 (SUTURE) IMPLANT
SUT GORETEX 6.0 TT13 (SUTURE) IMPLANT
SUT MNCRL AB 4-0 PS2 18 (SUTURE) ×6 IMPLANT
SUT PROLENE 5 0 C 1 24 (SUTURE) ×6 IMPLANT
SUT PROLENE 6 0 BV (SUTURE) ×3 IMPLANT
SUT PROLENE 7 0 BV 1 (SUTURE) IMPLANT
SUT SILK 2 0 FS (SUTURE) ×3 IMPLANT
SUT SILK 3 0 (SUTURE)
SUT SILK 3-0 18XBRD TIE 12 (SUTURE) IMPLANT
SUT VIC AB 2-0 CT1 27 (SUTURE) ×2
SUT VIC AB 2-0 CT1 TAPERPNT 27 (SUTURE) ×4 IMPLANT
SUT VIC AB 3-0 SH 27 (SUTURE) ×2
SUT VIC AB 3-0 SH 27X BRD (SUTURE) ×4 IMPLANT
SYRINGE 10CC LL (SYRINGE) ×3 IMPLANT
TERUMO PINNACLE INTRODUCER SHEATH FR. 11 ×3 IMPLANT
TOWEL OR 17X24 6PK STRL BLUE (TOWEL DISPOSABLE) ×6 IMPLANT
TOWEL OR 17X26 10 PK STRL BLUE (TOWEL DISPOSABLE) ×9 IMPLANT
TRAY FOLEY CATH 14FRSI W/METER (CATHETERS) ×3 IMPLANT
TUBING EXTENTION W/L.L. (IV SETS) ×3 IMPLANT
UNDERPAD 30X30 INCONTINENT (UNDERPADS AND DIAPERS) ×3 IMPLANT
VIPERTRACK RADIOPAQUE TAPE; 30CM ×3 IMPLANT
WATER STERILE IRR 1000ML POUR (IV SOLUTION) ×3 IMPLANT
WIRE AMPLATZ SS-J .035X180CM (WIRE) ×3 IMPLANT
WIRE AMPLATZ SS-J .035X260CM (WIRE) ×6 IMPLANT
WIRE HI TORQ VERSACORE J 260CM (WIRE) ×9 IMPLANT
WIRE MICRO SET SILHO 5FR 7 (SHEATH) IMPLANT
WIRE SPARTACORE .014X190CM (WIRE) ×3 IMPLANT

## 2012-12-29 NOTE — Anesthesia Preprocedure Evaluation (Addendum)
Anesthesia Evaluation  Patient identified by MRN, date of birth, ID band Patient awake    Reviewed: Allergy & Precautions, H&P , NPO status , Patient's Chart, lab work & pertinent test results  Airway Mallampati: I TM Distance: >3 FB Neck ROM: full    Dental  (+) Dental Advidsory Given, Teeth Intact and Poor Dentition   Pulmonary          Cardiovascular hypertension, + angina + Peripheral Vascular Disease Rhythm:regular Rate:Normal     Neuro/Psych    GI/Hepatic   Endo/Other    Renal/GU Renal disease     Musculoskeletal   Abdominal   Peds  Hematology   Anesthesia Other Findings Left arm AV fistula with good bruit and thrill  Reproductive/Obstetrics                         Anesthesia Physical Anesthesia Plan  ASA: III  Anesthesia Plan: General   Post-op Pain Management:    Induction: Intravenous  Airway Management Planned: Oral ETT  Additional Equipment:   Intra-op Plan:   Post-operative Plan: Extubation in OR  Informed Consent: I have reviewed the patients History and Physical, chart, labs and discussed the procedure including the risks, benefits and alternatives for the proposed anesthesia with the patient or authorized representative who has indicated his/her understanding and acceptance.     Plan Discussed with: CRNA, Anesthesiologist and Surgeon  Anesthesia Plan Comments:         Anesthesia Quick Evaluation

## 2012-12-29 NOTE — OR Nursing (Signed)
At bedside, Marisue Humble PA/vasc states "will only be able to get a popliteal pulse with doppler, there's nothing below there, had some runoff but that's all"

## 2012-12-29 NOTE — H&P (Addendum)
[facility] [place]  Brief History and Physical  History of Present Illness  Tim Kirby is a 72 y.o. male who presents with chief complaint: left femoropopliteal aneurysm.  The patient presents today for left femoral artery cutdown, antegrade cannulation of common femoral artery, left leg runoff, intravascular ultrasound of left femoropopliteal artery, possible Viabahn placement to exclude left popliteal aneurysm, possible exclusion of left femoropopliteal aneurysm, and possible femoropopliteal bypass..    Past Medical History  Diagnosis Date  . Hyperlipidemia   . Gout   . Secondary hyperparathyroidism   . Anemia   . Colon, diverticulosis   . Thyroid nodule   . Anginal pain     sees Dr. Riley Kill  . Hypertension     sees Dr. Lajuana Carry  . Chronic kidney disease     Tues, thurs, sat  . Aneurysm artery, popliteal     Past Surgical History  Procedure Laterality Date  . Knee surgery      Left cyst removal by Dr. Ophelia Charter  . Av fistula placement      left UA AVF  . Femoral-popliteal bypass graft  10/18/2012    Procedure: BYPASS GRAFT FEMORAL-POPLITEAL ARTERY;  Surgeon: Fransisco Hertz, MD;  Location: [facility] OR;  Service: Vascular;  Laterality: Right;  Right Popliteal Aneurysm Exclusion; Ultrasound guided    History   Social History  . Marital Status: Married    Spouse Name: N/A    Number of Children: N/A  . Years of Education: N/A   Occupational History  . Not on file.   Social History Main Topics  . Smoking status: Former Smoker    Types: Cigarettes, Pipe    Quit date: 09/18/2007  . Smokeless tobacco: Never Used  . Alcohol Use: No  . Drug Use: No  . Sexually Active: Not on file   Other Topics Concern  . Not on file   Social History Narrative  . No narrative on file    Family History  Problem Relation Age of Onset  . Hypertension Mother   . Diabetes Mother     No current facility-administered medications on file prior to encounter.    Current Outpatient Prescriptions on File Prior to Encounter  Medication Sig Dispense Refill  . amLODipine (NORVASC) 10 MG tablet Take 10 mg by mouth daily.      Marland Kitchen aspirin 325 MG tablet Take 325 mg by mouth daily.      Marland Kitchen b complex-vitamin c-folic acid (NEPHRO-VITE) 0.8 MG TABS Take 0.8 mg by mouth at bedtime.      . sevelamer (RENVELA) 800 MG tablet Take 3,200 mg by mouth 3 (three) times daily with meals.       . simvastatin (ZOCOR) 40 MG tablet Take 40 mg by mouth every evening.        No Known Allergies  Review of Systems: As listed above, otherwise negative.  Physical Examination  Filed Vitals:   12/29/12 0627 12/29/12 0632  BP:  141/82  Pulse:  67  Temp:  97.6 F (36.4 C)  TempSrc: Oral   Resp:  18  SpO2:  95%    General: A&O x 3, WDWN  Pulmonary: Sym exp, good air movt, CTAB, no rales, rhonchi, & wheezing  Cardiac: RRR, Nl S1, S2, no Murmurs, rubs or gallops  Gastrointestinal: soft, NTND, -G/R, - HSM, - masses, - CVAT B  Musculoskeletal: M/S 5/5 throughout , Extremities without ischemic changes , RLE 1+ edema, L AK fullness with pulse  Laboratory  See iStat  Medical Decision Making  Tim Kirby is a 72 y.o. male who presents with: L femoropopliteal aneurysm, B iliac aneurysms, s/p R femoropopliteal exlcusion & R femoropopliteal bypass.   The patient is scheduled for: left femoral artery cutdown, antegrade cannulation of common femoral artery, left leg runoff, intravascular ultrasound of left femoropopliteal artery, possible Viabahn placement to exclude left popliteal aneurysm, possible exclusion of left femoropopliteal aneurysm, and possible femoropopliteal bypass.  I have previously had a long discussion in regards to his options. 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  I suspect the same difficulties would be encountered on the  left, so we agree to try an endovascular intervention to exclude the popliteal aneurysm at this point.  The patient is aware that should the aneurysm grow, surgical intervention may be necessary in the future.  I discussed with the patient the nature of angiographic procedures, especially the limited patencies of any endovascular intervention.  The patient is aware of that the risks of an angiographic procedure include but are not limited to: bleeding, infection, access site complications, renal failure, embolization, rupture of vessel, dissection, possible need for emergent surgical intervention, possible need for surgical procedures to treat the patient's pathology, and stroke and death.  The patient is aware of the risks and agrees to proceed.  The risk, benefits, and alternative for bypass operations were discussed with the patient.  The patient is aware the risks include but are not limited to: bleeding, infection, myocardial infarction, stroke, limb loss, nerve damage, need for additional procedures in the future, wound complications, and inability to complete the bypass.  The patient is aware of these risks and agreed to proceed.  The patient is aware of the risks and agrees to proceed.  Leonides Sake, MD [facility] [place] Office: [phone] Pager: [phone]  12/29/2012, 8:05 AM

## 2012-12-29 NOTE — Progress Notes (Signed)
ANTIBIOTIC CONSULT NOTE - INITIAL  Pharmacy Consult for Antibiotic dose adjustment  Indication: Post-op prophylaxis  No Known Allergies   Vital Signs: Temp: 97.9 F (36.6 C) (03/26 1430) Temp src: Oral (03/26 0627) BP: 125/75 mmHg (03/26 1440) Pulse Rate: 73 (03/26 1440) Intake/Output from previous day:   Intake/Output from this shift: Total I/O In: 1150 [I.V.:900; IV Piggyback:250] Out: 430 [Urine:30; Blood:400]  Labs:  Recent Labs  12/29/12 0637  HGB 13.3   The CrCl is unknown because both a height and weight (above a minimum accepted value) are required for this calculation. No results found for this basename: VANCOTROUGH, Leodis Binet, VANCORANDOM, GENTTROUGH, GENTPEAK, GENTRANDOM, TOBRATROUGH, TOBRAPEAK, TOBRARND, AMIKACINPEAK, AMIKACINTROU, AMIKACIN,  in the last 72 hours   Microbiology: Recent Results (from the past 720 hour(s))  SURGICAL PCR SCREEN     Status: None   Collection Time    12/22/12  8:19 AM      Result Value Range Status   MRSA, PCR NEGATIVE  NEGATIVE Final   Staphylococcus aureus NEGATIVE  NEGATIVE Final   Comment:            The Xpert SA Assay (FDA     approved for NASAL specimens     in patients over 69 years of age),     is one component of     a comprehensive surveillance     program.  Test performance has     been validated by [employer] for patients greater     than or equal to 45 year old.     It is not intended     to diagnose infection nor to     guide or monitor treatment.  URINE CULTURE     Status: None   Collection Time    12/22/12  8:20 AM      Result Value Range Status   Specimen Description URINE, CLEAN CATCH   Final   Special Requests NONE   Final   Culture  Setup Time 12/22/2012 09:50   Final   Colony Count NO GROWTH   Final   Culture NO GROWTH   Final   Report Status 12/23/2012 FINAL   Final    Medical History: Past Medical History  Diagnosis Date  . Hyperlipidemia   . Gout   . Secondary hyperparathyroidism    . Anemia   . Colon, diverticulosis   . Thyroid nodule   . Anginal pain     sees Dr. Riley Kill  . Hypertension     sees Dr. Lajuana Carry  . Chronic kidney disease     Tues, thurs, sat  . Aneurysm artery, popliteal     Medications:  Prescriptions prior to admission  Medication Sig Dispense Refill  . amLODipine (NORVASC) 10 MG tablet Take 10 mg by mouth daily.      Marland Kitchen aspirin 325 MG tablet Take 325 mg by mouth daily.      Marland Kitchen b complex-vitamin c-folic acid (NEPHRO-VITE) 0.8 MG TABS Take 0.8 mg by mouth at bedtime.      . sevelamer (RENVELA) 800 MG tablet Take 3,200 mg by mouth 3 (three) times daily with meals.       . simvastatin (ZOCOR) 40 MG tablet Take 40 mg by mouth every evening.       Assessment: Tim Kirby is s/p stenting and angioplasty of left femoral artery. Noted hx of ESRD (TTS HD) and orders for post-op Zinacef. Noted preop dose was given ~ 0830 this AM.  Plan:  - Change Zinacef to 1.5gm IV x 1 tomorrow AM to complete post-op prophylactic course (will d/c orders for Zinacef 1.5gm IV q 12h x 2 doses) - Pharmacy signs off, please reconsult if needed  Thanks, [name], PharmD, BCPS.  Clinical Pharmacist Pager [phone]. 12/29/2012 3:55 PM

## 2012-12-29 NOTE — Anesthesia Postprocedure Evaluation (Signed)
  Anesthesia Post Note  Patient: Tim Kirby  Procedure(s) Performed: Procedure(s) (LRB): REPAIR FALSE ANEURYSM (Left) INTRAVASCULAR ULTRASOUND (Left) ANGIOGRAM EXTREMITY LEFT (Left) ENDOVASCULAR REPAIR OF POPLITEAL AND FEMORAL ARTERY ANEURYSM (Left)  Anesthesia type: GA  Patient location: PACU  Post pain: Pain level controlled  Post assessment: Post-op Vital signs reviewed  Last Vitals:  Filed Vitals:   12/29/12 1440  BP: 125/75  Pulse: 73  Temp:   Resp: 14    Post vital signs: Reviewed  Level of consciousness: sedated  Complications: No apparent anesthesia complications

## 2012-12-29 NOTE — OR Nursing (Signed)
bair hugger applied to lower extremities

## 2012-12-29 NOTE — Progress Notes (Signed)
Utilization review completed.  

## 2012-12-29 NOTE — Op Note (Addendum)
OPERATIVE NOTE   PROCEDURE: 1. Left common femoral artery exposure 2. Left leg runoff 3. Left arterial evaluation with intravascular ultrasound 4. Stenting and angioplasty of left femoral artery (8 mm x 10 cm, 10 mm x 10 cm, 11 mm x 10 cm)  PRE-OPERATIVE DIAGNOSIS: asymptomatic left femoropopliteal aneurysm  POST-OPERATIVE DIAGNOSIS: same as above   SURGEON: Leonides Sake, MD  ASSISTANT(S): Lianne Cure, PAC   ANESTHESIA: general  ESTIMATED BLOOD LOSS: 300 cc  FINDING(S): 1. Large femoropopliteal aneurysm in distal superficial femoral artery / above the knee popliteal artery: excluded by stenting 2. Unchanged runoff in the left leg except for more prominent distal posterior tibial artery reconstitution.  Peroneal artery dwindles in distal leg.  Anterior tibial artery is occluded.  As previously, there is no in-line flow to the left foot. 3. Dopplerable popliteal signal at end of the case  SPECIMEN(S):  none  INDICATIONS:   Tim Kirby is a 72 y.o. male who presents with bilateral iliofemoropopliteal aneurysms.  Neither iliac or proximal femoral segments need intervention.  The right femoropopliteal artery was excluded and reconstructed with a femoropopliteal bypass.  The left leg, unlike the right leg, is compatible with stent exclusion of the left popliteal artery aneurysm.  I discuss with the patient an attempt at Viabahn exclusion of the left popliteal aneurysm and if this failed, proceeding with femoropopliteal artery exclusion and femoropopliteal bypass.  The risk, benefits, and alternative for bypass operations were discussed with the patient.  The patient is aware the risks include but are not limited to: bleeding, infection, myocardial infarction, stroke, limb loss, nerve damage, need for additional procedures in the future, wound complications, and inability to complete the bypass.  I discussed with the patient the nature of angiographic procedures, especially the limited  patencies of any endovascular intervention.  The patient is aware of that the risks of an angiographic procedure include but are not limited to: bleeding, infection, access site complications, renal failure, embolization, rupture of vessel, dissection, possible need for emergent surgical intervention, possible need for surgical procedures to treat the patient's pathology, and stroke and death.  The patient is aware of the risks and agrees to proceed.  DESCRIPTION: After obtaining full informed written consent, the patient was brought back to the operating room and placed supine upon the operating table.  The patient received IV antibiotics prior to induction.  After obtaining adequate anesthesia, the patient was prepped and draped in the standard fashion for: left leg bypass procedure.  I turned my attention to the left groin.  I made an incision over the common femoral artery and dissected out the common femoral artery from the bifurcation up to external iliac artery.  Vessel loops were applied to all branches.   The common femoral artery was heavily calcified and aneurysmal, ~2.0 cm in diameter.  There was a soft spot in the anterior wall of the common femoral artery.  I cannulated the common femoral artery with a 18 gauge needle and passed a short J-wire into the superficial femoral artery, to obtain antegrade access.  I exchanged the needle for a 5-Fr sheath.  I passed a Benson wire into the left superficial femoral artery.  I placed a KMP wire over the wire and using these this combination, I navigated into the below-the-knee popliteal artery.  I connected the side port of the sheath to the power injector circuit after a de-airring and de-clotting maneuver.  I performed a limited left leg runoff from left groin down to  distal popliteal artery.  Due to the tortuosity in the artery, I felt vessel measurements from angiography would be inadequate.  I placed an end-hole catheter over the wire and exchanged the  wire for a 0.014" Spartacore.  An Volcano Eagle-eye IVUS was loaded over the wire.  Based on the images,  I made measurements of the femoropopliteal artery 2 cm proximal and distal to the aneurysm.  Based on the measurements, I felt this popliteal aneurysm could be endovascularly excluded.  The patient was given 6500 units of Heparin intravenously, which was a therapeutic bolus.  I selected a 8 mm x 100 mm and 10 mm x 100 mm Viabahn stents to exclude this popliteal aneurysm.  I placed an end-hole catheter over the wire and exchanged the wire for a Versacore wire.  The sheath was exchanged for a 11-Fr sheath.  A 8 mm x 100 mm Viabahn stent was deployed 2 cm proximal to the popliteal aneurysm.  The stent delivery device was removed.  A 8 mm x 10 cm angioplasty balloon was carefully advanced into the stent and inflated to 10 atm for 1 minutes to fully deploy the stent.  I deflated the balloon and removed it over the wire.  The 10 mm x 100 mm Viabahn stent was loaded over the wire but caught on some the calcification within the superficial femoral artery.  The Viabahn partially deployed and could not be pulled back into the sheath.  Based on the imaging, the stent was positioned in the proximal superficial femoral artery.  As a bailout maneuver, I elected to deploy this stent in the proximal superficial femoral artery, ~4 cm distal to the femoral bifurcation.  I exchanged the stent deployment device for the 8 mm x 100 mm angioplasty balloon and inflated this balloon to 10 atm for 1 minutes.  There appeared to be good wall opposition on imaging.  At this point, I exchanged the sheath for a long 12-Fr sheath which only advanced into the mid-superficial femoral artery due to calcification.  I obtained a 6 mm x 80 mm balloon and angioplastied the superficial femoral artery at 18 atm for 2 minutes to try to open up enough lumen to pass the sheath.  The balloon was deflated and removed.  I replaced the dilator in the 12-Fr  sheath and advanced it pass the most significant stenoses.  The dilator was removed and then I deployed a 11 mm x 100 mm Viabahn stent over the wire so there was adequate overlap and the distal extent of the stent extended 2 cm past the aneurysm.  The stent delivery device was removed.  I angioplastied the distal landing zone with 10 mm x 40 mm angioplasty balloon.  I also inflated the balloon at the overlap point until profile was obtained.  I did a power injection via the sheath, which demonstrated some endoleak at the junction site, so I placed a 8 mm x 10 cm balloon over the wire and inflated the balloon to 10 atm for 1 minute.  The completion image demonstrated resolution of the endoleak.  The popliteal aneurysm was completed excluded at this point.  At this point, I completed the rest of the runoff in this patient, demonstrating the findings noted above.  The runoff findings are completely unchanged from previous angiography except for more prominent filling of the distal posterior tibial artery.  On exam, there is a dopplerable popliteal artery but I could not find any distal signals currently.  I suspect  some degree of vasospasm as previously there were distal signals despite having the same severe runoff disease.   At this point, I removed all wires and removed the sheath while compressing the artery.  I tried to clamp the common femoral artery proximally and distally but the calcification made it difficult to fully compress the artery.  I placed a Colley clamp around the arteriotomy to obtain control.  I repaired the arteriotomy with a running 5-0 Prolene.  Prior to completing the repair, I allowed the arteriotomy to bleed.  No clot was noted.  I placed thrombin and gelfoam in the left groin.  After waiting a few minutes, no further bleeding was noted.  I closed the left groin incision with a double layer of 2-0 Vicryl stitch, a double layer of 3-0 Vicryl stitch and and a running subcuticular of 4-0  Monocryl.  The skin was cleaned, dried, and reinforced with Dermabond.    If this endovascular exclusion fails in the future, I would recommend consideration of a femoral to posterior tibial bypass with vein.  On previous angiography, this distal posterior tibial artery was not large enough to be considered for a distal target.  Likely with the improvement in laminar flow with exclusion of the aneurysm, the collateral vessels are filling more efficiently, thus demonstrating this posterior tibial artery better.  COMPLICATIONS: none  CONDITION: stable  Leonides Sake, MD [facility] [place] Office: [phone] Pager: [phone]  12/29/2012, 1:24 PM

## 2012-12-29 NOTE — Anesthesia Procedure Notes (Signed)
Procedure Name: Intubation Date/Time: 12/29/2012 8:43 AM Performed by: Carmela Rima Pre-anesthesia Checklist: Patient identified, Emergency Drugs available, Suction available, Timeout performed and Patient being monitored Patient Re-evaluated:Patient Re-evaluated prior to inductionOxygen Delivery Method: Circle system utilized Intubation Type: IV induction Ventilation: Mask ventilation without difficulty Laryngoscope Size: Mac and 3 Grade View: Grade III Tube type: Oral Tube size: 7.5 mm Number of attempts: 2 Airway Equipment and Method: Bougie stylet Placement Confirmation: positive ETCO2 and breath sounds checked- equal and bilateral Secured at: 23 cm Tube secured with: Tape Dental Injury: Teeth and Oropharynx as per pre-operative assessment

## 2012-12-29 NOTE — Preoperative (Signed)
Beta Blockers   Reason not to administer Beta Blockers:Not Applicable 

## 2012-12-29 NOTE — Transfer of Care (Signed)
Immediate Anesthesia Transfer of Care Note  Patient: Tim Kirby  Procedure(s) Performed: Procedure(s): REPAIR FALSE ANEURYSM (Left) INTRAVASCULAR ULTRASOUND (Left) ANGIOGRAM EXTREMITY LEFT (Left)  Patient Location: PACU  Anesthesia Type:General  Level of Consciousness: sedated  Airway & Oxygen Therapy: Patient Spontanous Breathing and Patient connected to face mask oxygen  Post-op Assessment: Report given to PACU RN, Post -op Vital signs reviewed and stable and Patient moving all extremities X 4  Post vital signs: Reviewed and stable  Complications: No apparent anesthesia complications

## 2012-12-30 ENCOUNTER — Encounter (HOSPITAL_COMMUNITY): Payer: Self-pay | Admitting: Acute Care

## 2012-12-30 ENCOUNTER — Other Ambulatory Visit: Payer: Self-pay | Admitting: *Deleted

## 2012-12-30 ENCOUNTER — Telehealth: Payer: Self-pay | Admitting: Vascular Surgery

## 2012-12-30 DIAGNOSIS — M7989 Other specified soft tissue disorders: Secondary | ICD-10-CM

## 2012-12-30 DIAGNOSIS — I724 Aneurysm of artery of lower extremity: Secondary | ICD-10-CM

## 2012-12-30 LAB — BASIC METABOLIC PANEL
BUN: 34 mg/dL — ABNORMAL HIGH (ref 6–23)
Calcium: 9.7 mg/dL (ref 8.4–10.5)
Creatinine, Ser: 9.98 mg/dL — ABNORMAL HIGH (ref 0.50–1.35)
GFR calc Af Amer: 5 mL/min — ABNORMAL LOW (ref >90)
GFR calc non Af Amer: 5 mL/min — ABNORMAL LOW (ref >90)

## 2012-12-30 LAB — RENAL FUNCTION PANEL
Albumin: 3.4 g/dL — ABNORMAL LOW (ref 3.5–5.2)
Calcium: 9.5 mg/dL (ref 8.4–10.5)
GFR calc Af Amer: 7 mL/min — ABNORMAL LOW (ref >90)
Glucose, Bld: 90 mg/dL (ref 70–99)
Phosphorus: 4.1 mg/dL (ref 2.3–4.6)
Potassium: 4.2 mEq/L (ref 3.5–5.1)
Sodium: 137 mEq/L (ref 135–145)

## 2012-12-30 LAB — CBC
HCT: 31.1 % — ABNORMAL LOW (ref 39.0–52.0)
MCHC: 31.2 g/dL (ref 30.0–36.0)
MCV: 91.7 fL (ref 78.0–100.0)
RDW: 14.5 % (ref 11.5–15.5)

## 2012-12-30 MED ORDER — DOXERCALCIFEROL 4 MCG/2ML IV SOLN
4.0000 ug | INTRAVENOUS | Status: DC
  Filled 2012-12-30: qty 2

## 2012-12-30 MED ORDER — DOXERCALCIFEROL 4 MCG/2ML IV SOLN
INTRAVENOUS | Status: AC
  Administered 2012-12-30: 4 ug via INTRAVENOUS
  Filled 2012-12-30: qty 2

## 2012-12-30 MED ORDER — CLOPIDOGREL BISULFATE 75 MG PO TABS: 75.0000 mg | ORAL_TABLET | Freq: Every day | ORAL | Status: DC

## 2012-12-30 MED ORDER — DARBEPOETIN ALFA-POLYSORBATE 60 MCG/0.3ML IJ SOLN
INTRAMUSCULAR | Status: AC
  Administered 2012-12-30: 60 ug via INTRAVENOUS
  Filled 2012-12-30: qty 0.3

## 2012-12-30 MED ORDER — OXYCODONE HCL 5 MG PO TABS: 5.0000 mg | ORAL_TABLET | ORAL | Status: DC | PRN

## 2012-12-30 MED ORDER — DARBEPOETIN ALFA-POLYSORBATE 60 MCG/0.3ML IJ SOLN
60.0000 ug | INTRAMUSCULAR | Status: DC
  Filled 2012-12-30: qty 0.3

## 2012-12-30 NOTE — Progress Notes (Signed)
Pt d/c'd home per MD order. All discharge instructions were reviewed and all questions answered.

## 2012-12-30 NOTE — Telephone Encounter (Addendum)
Message copied by Rosalyn Charters on Thu Dec 30, 2012  2:09 PM ------      Message from: Melene Plan      Created: Thu Dec 30, 2012 11:50 AM       ORDER IN      ----- Message -----         From: Lars Mage, PA-C         Sent: 12/30/2012  11:22 AM           To: Melene Plan, RN            Left popliteal anuyrism stent Needs ABI in office day of return.  Dr. Imogene Burn 2 weeks follow up.       ------notified patient of fu appt. on 01-14-13 1:00pm

## 2012-12-30 NOTE — Procedures (Signed)
I was present at this dialysis session. I have reviewed the session itself and made appropriate changes.   Vinson Moselle, MD [employer] 12/30/2012, 12:14 PM

## 2012-12-30 NOTE — Progress Notes (Signed)
VASCULAR LAB PRELIMINARY  PRELIMINARY  PRELIMINARY  PRELIMINARY  Right lower extremity venous duplex completed.    Preliminary report:  Right:  No evidence of DVT, superficial thrombosis, or Baker's cyst.  Nikeya Maxim, RVT 12/30/2012, 11:34 AM

## 2012-12-30 NOTE — Consult Note (Signed)
Short History and Physical Form  12/30/2012,11:21 AM  LOS: 1 day   HPI:  Tim Kirby is an 72 y.o. male.  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 Imogene Burn.  His pain is well controlled and he is able to ambulate around the unit. He denies any other complaints.  He was to be discharged and dialyzed today as an outpatient, however his K+ was elevated at 6.2.  We are consulted for dialysis orders secondary to hyperkalemia.  Outpatient Dialysis Orders: [facility] on TTS. EDW 82 kg. Time 4:00 Optiflux 160.  2K+/2Ca++ Bath. BFR 450/ DFR A1.5. Hectorol 4 mcg IV/HD. Epo 9200 u IV/HD LFA AVF Buttonhole  Past Medical History  Diagnosis Date  . Hyperlipidemia   . Gout   . Secondary hyperparathyroidism   . Anemia   . Colon, diverticulosis   . Thyroid nodule   . Anginal pain     sees Dr. Riley Kill  . Hypertension     sees Dr. Lajuana Carry  . Chronic kidney disease     Tues, thurs, sat  . Aneurysm artery, popliteal    No Known Allergies Prior to Admission medications   Medication Sig Start Date End Date Taking? Authorizing Provider  amLODipine (NORVASC) 10 MG tablet Take 10 mg by mouth daily.   Yes Historical Provider, MD  aspirin 325 MG tablet Take 325 mg by mouth daily.   Yes Historical Provider, MD  b complex-vitamin c-folic acid (NEPHRO-VITE) 0.8 MG TABS Take 0.8 mg by mouth at bedtime.   Yes Historical Provider, MD  sevelamer (RENVELA) 800 MG tablet Take 3,200 mg by mouth 3 (three) times daily with meals.    Yes Historical Provider, MD  simvastatin (ZOCOR) 40 MG tablet Take 40 mg by mouth every evening.   Yes Historical Provider, MD  oxyCODONE (OXY IR/ROXICODONE) 5 MG immediate release tablet Take 1-2 tablets (5-10 mg total) by mouth every 4 (four) hours as needed. 12/30/12   Lars Mage, PA-C   Results for orders placed during the hospital encounter of 12/29/12 (from the past 48 hour(s))   TYPE AND SCREEN     Status: None   Collection Time    12/29/12  6:25 AM      Result Value Range   ABO/RH(D) O POS     Antibody Screen NEG     Sample Expiration 01/01/2013    POCT I-STAT 4, (NA,K, GLUC, HGB,HCT)     Status: None   Collection Time    12/29/12  6:37 AM      Result Value Range   Sodium 139  135 - 145 mEq/L   Potassium 4.5  3.5 - 5.1 mEq/L   Glucose, Bld 86  70 - 99 mg/dL   HCT 09.8  11.9 - 14.7 %   Hemoglobin 13.3  13.0 - 17.0 g/dL  CBC     Status: Abnormal   Collection Time    12/29/12  4:31 PM      Result Value Range   WBC 12.0 (*) 4.0 - 10.5 K/uL   RBC 3.88 (*) 4.22 - 5.81 MIL/uL   Hemoglobin 11.3 (*) 13.0 - 17.0 g/dL   HCT 82.9 (*) 56.2 - 13.0 %   MCV 92.0  78.0 - 100.0 fL   MCH 29.1  26.0 - 34.0 pg   MCHC 31.7  30.0 - 36.0 g/dL   RDW 86.5  78.4 -  15.5 %   Platelets 219  150 - 400 K/uL  CREATININE, SERUM     Status: Abnormal   Collection Time    12/29/12  4:31 PM      Result Value Range   Creatinine, Ser 8.60 (*) 0.50 - 1.35 mg/dL   GFR calc non Af Amer 5 (*) >90 mL/min   GFR calc Af Amer 6 (*) >90 mL/min   Comment:            The eGFR has been calculated     using the CKD EPI equation.     This calculation has not been     validated in all clinical     situations.     eGFR's persistently     <90 mL/min signify     possible Chronic Kidney Disease.  CBC     Status: Abnormal   Collection Time    12/30/12  5:10 AM      Result Value Range   WBC 9.6  4.0 - 10.5 K/uL   RBC 3.39 (*) 4.22 - 5.81 MIL/uL   Hemoglobin 9.7 (*) 13.0 - 17.0 g/dL   HCT 78.2 (*) 95.6 - 21.3 %   MCV 91.7  78.0 - 100.0 fL   MCH 28.6  26.0 - 34.0 pg   MCHC 31.2  30.0 - 36.0 g/dL   RDW 08.6  57.8 - 46.9 %   Platelets 234  150 - 400 K/uL  BASIC METABOLIC PANEL     Status: Abnormal   Collection Time    12/30/12  5:10 AM      Result Value Range   Sodium 133 (*) 135 - 145 mEq/L   Potassium 6.2 (*) 3.5 - 5.1 mEq/L   Comment: DELTA CHECK NOTED     SLIGHT HEMOLYSIS   Chloride  95 (*) 96 - 112 mEq/L   CO2 26  19 - 32 mEq/L   Glucose, Bld 95  70 - 99 mg/dL   BUN 34 (*) 6 - 23 mg/dL   Creatinine, Ser 6.29 (*) 0.50 - 1.35 mg/dL   Calcium 9.7  8.4 - 52.8 mg/dL   GFR calc non Af Amer 5 (*) >90 mL/min   GFR calc Af Amer 5 (*) >90 mL/min   Comment:            The eGFR has been calculated     using the CKD EPI equation.     This calculation has not been     validated in all clinical     situations.     eGFR's persistently     <90 mL/min signify     possible Chronic Kidney Disease.  POTASSIUM     Status: None   Collection Time    12/30/12  8:50 AM      Result Value Range   Potassium 5.1  3.5 - 5.1 mEq/L   No results found.  ROS: 10 pt ROS asked and answered. All systems negative except as described in the HPI above  Physical Exam: Filed Vitals:   12/30/12 1116  BP: 121/74  Pulse: 66  Temp: 99 F (37.2 C)  Resp: 19   General: Well developed, well nourished AAM. NAD Heart: RRR, no murmur or rub appreciated Lungs: CTA bilaterally. No wheezes, rales or rhonchi Abdomen: Soft, non-tender, non-distended. Normal BS. No organomegaly Extremities: Moves all limbs. Left groin incision clean/dry/ with dermabond. No LE edema Access: LFA AVF with + bruit  Assessment/Plan: 1. Hyperkalemia - K+ 6.2.  Should  resolve with HD. 1K+ bath for 2 hours then resume normal op bath 2. ESRD: TTS. HD ordered inpatient today then ok for discharge per vascular. May resume normal op HD on Sat. Center notified. 3. Anemia: Hgb trending down, post-operatively as expected. 13.3 > 11.3 > 9.7. Currently on op Epo 9200 u. Aranesp 60 ordered for HD today. Will continue to monitor Hgb closely at the dialysis center. 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 Op Hectorol 4 ordered.  Claud Kelp, PA-C [facility] Pager [phone]  12/30/2012, 11:21 AM   Attending Physician: Delano Metz, MD  Patient seen and examined.  Agree with assessment and plan as  above. Vinson Moselle  MD [phone] pgr    [phone] cell 12/30/2012, 12:14 PM

## 2012-12-30 NOTE — Progress Notes (Signed)
[facility] [place]  Daily Progress Note  Assessment/Planning: POD #1 s/p L femoral cutdown, PTA+S L SFA/Pop artery for popliteal aneurysm, acute blood loss related to surgery, ESRD   Pt ambulating without difficulty.  I had long discussion with wife yesterday in regards to the poor tibial runoff status in both feet.  She is also aware the extra stent that was placed due to stent malfunction.  Pt remains asx in both feet  Recheck K.  If still elevated, will see if Renal wants to dialyze in the hospital or as an outpatient.  H/H drop appropriate for intraop blood loss.  Pt remains asx from the anemia.  D/C home today after hyperkalemia issue resolved  Subjective  - 1 Day Post-Op  Pain well controlled.  Walked around the unit.  Objective Filed Vitals:   12/29/12 2000 12/30/12 0000 12/30/12 0400 12/30/12 0717  BP: 117/69 113/73 116/77 120/68  Pulse: 71 69 66 66  Temp: 98.3 F (36.8 C) 98.5 F (36.9 C) 98.9 F (37.2 C) 98.8 F (37.1 C)  TempSrc: Oral Oral Oral Oral  Resp: 21 17 20 15   Height:      Weight:      SpO2: 97% 93% 96% 98%    Intake/Output Summary (Last 24 hours) at 12/30/12 0811 Last data filed at 12/30/12 0800  Gross per 24 hour  Intake   3100 ml  Output    450 ml  Net   2650 ml    PULM  CTAB CV  RRR GI  soft, NTND VASC  L groin c/d/i, both feet warm, +PT signal on L  Laboratory CBC    Component Value Date/Time   WBC 9.6 12/30/2012 0510   HGB 9.7* 12/30/2012 0510   HCT 31.1* 12/30/2012 0510   PLT 234 12/30/2012 0510    BMET    Component Value Date/Time   NA 133* 12/30/2012 0510   K 6.2* 12/30/2012 0510   CL 95* 12/30/2012 0510   CO2 26 12/30/2012 0510   GLUCOSE 95 12/30/2012 0510   BUN 34* 12/30/2012 0510   CREATININE 9.98* 12/30/2012 0510   CREATININE 9.25* 10/01/2012 1510   CALCIUM 9.7 12/30/2012 0510   GFRNONAA 5* 12/30/2012 0510   GFRAA 5* 12/30/2012 0510    Leonides Sake, MD [facility]  [employer] Office: [phone] Pager: [phone]  12/30/2012, 8:11 AM

## 2012-12-30 NOTE — Discharge Summary (Signed)
[facility] Discharge Summary   Patient ID:  Tim Kirby MRN: [id number] DOB/AGE: 1940/10/28 72 y.o.  Admit date: 12/29/2012 Discharge date: 12/30/2012 Date of Surgery: 12/29/2012 Surgeon: Surgeon(s): Fransisco Hertz, MD  Admission Diagnosis: CLAUDICATION,ANEURYSM  Discharge Diagnoses:  CLAUDICATION,ANEURYSM  Secondary Diagnoses: Past Medical History  Diagnosis Date  . Hyperlipidemia   . Gout   . Secondary hyperparathyroidism   . Anemia   . Colon, diverticulosis   . Thyroid nodule   . Anginal pain     sees Dr. Riley Kill  . Hypertension     sees Dr. Lajuana Carry  . Chronic kidney disease     Tues, thurs, sat  . Aneurysm artery, popliteal     Procedure(s): REPAIR FALSE ANEURYSM INTRAVASCULAR ULTRASOUND ANGIOGRAM EXTREMITY LEFT ENDOVASCULAR REPAIR OF POPLITEAL AND FEMORAL ARTERY ANEURYSM  Discharged Condition: stable  HPI: Tim Kirby is a 72 y.o. male who presents with chief complaint: left femoropopliteal aneurysm.  He has a history of hyperlipidemia and is on Zocor and  Hypertension treated with Norvasc.  He is currently on dialysis T-T-S in [place], [place].   Hospital Course:  Tim Kirby is a 72 y.o. male is S/P Left Procedure(s): REPAIR FALSE ANEURYSM INTRAVASCULAR ULTRASOUND ANGIOGRAM EXTREMITY LEFT ENDOVASCULAR REPAIR OF POPLITEAL AND FEMORAL ARTERY ANEURYSM Extubated: POD # 0 Physical exam:  PULM CTAB  CV RRR  GI soft, NTND  VASC L groin c/d/i, both feet warm, +PT signal on L via doppler.  Post-op wounds clean, dry, intact or healing well Pt. Ambulating, voiding and taking PO diet without difficulty. Pt pain controlled with PO pain meds. Labs as below Complications: 1)Hyperkalemia 6.2-5.1  He will receive dialysis today prior to discharge home. 2)acute blood loss related to surgery stable asymptomatic 9.7 hemoglobin.    Consults:  Treatment Team:  Maree Krabbe, MD  Significant Diagnostic Studies: CBC Lab Results   Component Value Date   WBC 9.6 12/30/2012   HGB 9.7* 12/30/2012   HCT 31.1* 12/30/2012   MCV 91.7 12/30/2012   PLT 234 12/30/2012    BMET    Component Value Date/Time   NA 133* 12/30/2012 0510   K 5.1 12/30/2012 0850   CL 95* 12/30/2012 0510   CO2 26 12/30/2012 0510   GLUCOSE 95 12/30/2012 0510   BUN 34* 12/30/2012 0510   CREATININE 9.98* 12/30/2012 0510   CREATININE 9.25* 10/01/2012 1510   CALCIUM 9.7 12/30/2012 0510   GFRNONAA 5* 12/30/2012 0510   GFRAA 5* 12/30/2012 0510   COAG Lab Results  Component Value Date   INR 1.09 12/22/2012   INR 1.21 10/13/2012     Disposition:  Discharge to :Home Discharge Orders   Future Orders Complete By Expires     Call MD for:  redness, tenderness, or signs of infection (pain, swelling, bleeding, redness, odor or green/yellow discharge around incision site)  As directed     Call MD for:  severe or increased pain, loss or decreased feeling  in affected limb(s)  As directed     Call MD for:  temperature >100.5  As directed     Driving Restrictions  As directed     Comments:      No driving for 2 weeks    Increase activity slowly  As directed     Comments:      Walk with assistance use walker or cane as needed    Lifting restrictions  As directed     Comments:  No lifting for 6 weeks    May shower   As directed     Resume previous diet  As directed         Medication List    TAKE these medications       amLODipine 10 MG tablet  Commonly known as:  NORVASC  Take 10 mg by mouth daily.     aspirin 325 MG tablet  Take 325 mg by mouth daily.     b complex-vitamin c-folic acid 0.8 MG Tabs  Take 0.8 mg by mouth at bedtime.     oxyCODONE 5 MG immediate release tablet  Commonly known as:  Oxy IR/ROXICODONE  Take 1-2 tablets (5-10 mg total) by mouth every 4 (four) hours as needed.     sevelamer carbonate 800 MG tablet  Commonly known as:  RENVELA  Take 3,200 mg by mouth 3 (three) times daily with meals.     simvastatin 40 MG tablet   Commonly known as:  ZOCOR  Take 40 mg by mouth every evening.       Verbal and written Discharge instructions given to the patient. Wound care per Discharge AVS F/U in 2 weeks with Dr. Imogene Burn.  ABI will be done in the office. Discharge after dialysis today.  SignedMosetta Pigeon 12/30/2012, 11:11 AM  Addendum  I have independently interviewed and examined the patient, and I agree with the physician assistant's discharge summary.  Pt underwent endovascular exclusion of the L popliteal aneurysm, which required left femoral cutdown also due to large sheath size needed for the stents.  The patient has warm feet bilaterally and no symptoms of claudication this AM.  He will follow in the office in 2 weeks.  Leonides Sake, MD [facility] [place] Office: [phone] Pager: [phone]  12/30/2012, 12:53 PM

## 2012-12-30 NOTE — Evaluation (Signed)
Occupational Therapy Evaluation Patient Details Name: Tim Kirby MRN: [id number] DOB: April 20, 1941 Today's Date: 12/30/2012 Time: 4098-1191 OT Time Calculation (min): 22 min  OT Assessment / Plan / Recommendation Clinical Impression  72 yo male s/p fem bypass graft that does not require skilled Ot acutely. Recommend NO follow up . Ot to sign off    OT Assessment  Patient does not need any further OT services    Follow Up Recommendations  No OT follow up    Barriers to Discharge      Equipment Recommendations  None recommended by OT    Recommendations for Other Services    Frequency       Precautions / Restrictions Precautions Precautions: Fall Restrictions Weight Bearing Restrictions: No   Pertinent Vitals/Pain Pain at left groining area    ADL  Grooming: Wash/dry face;Wash/dry hands;Independent Where Assessed - Grooming: Unsupported standing Lower Body Dressing: Maximal assistance Where Assessed - Lower Body Dressing: Unsupported sit to stand (plans to have wife (A) at d/c) Toilet Transfer: Modified independent Toilet Transfer Method: Sit to stand Toilet Transfer Equipment: Raised toilet seat with arms (or 3-in-1 over toilet) Equipment Used: Gait belt;Rolling walker Transfers/Ambulation Related to ADLs: ambulates with RW MOD I ADL Comments: Pt will have wife and mothers (A) for LB dressing adls. Pt with pain at Lt groining area limiting LB adls.    OT Diagnosis:    OT Problem List:   OT Treatment Interventions:     OT Goals    Visit Information  Last OT Received On: 12/30/12 Assistance Needed: +1 PT/OT Co-Evaluation/Treatment: Yes    Subjective Data  Subjective: "I take 4 with every meal to keep your stomach from getting upset. It dont work" Patient Stated Goal: to return home today   Prior Functioning     Home Living Lives With: Spouse;Other (Comment) (mother) Available Help at Discharge: Family;Available 24 hours/day Type of Home: House Home  Access: Stairs to enter [employer] of Steps: 1 Entrance Stairs-Rails: None Home Layout: One level Bathroom Shower/Tub: Forensic scientist: Standard Home Adaptive Equipment: Grab bars in shower;Walker - rolling;Straight cane Prior Function Level of Independence: Independent Able to Take Stairs?: Yes Driving: Yes Vocation: Part time employment (works for [employer]) Communication Communication: No difficulties Dominant Hand: Right         Vision/Perception Vision - History Baseline Vision: Wears glasses all the time Patient Visual Report: Other (comment) (no glasses present)   Cognition  Cognition Overall Cognitive Status: Appears within functional limits for tasks assessed/performed Arousal/Alertness: Awake/alert Orientation Level: Appears intact for tasks assessed Behavior During Session: [facility] for tasks performed    Extremity/Trunk Assessment Right Upper Extremity Assessment RUE ROM/Strength/Tone: Within functional levels RUE Sensation: WFL - Light Touch RUE Coordination: WFL - gross/fine motor Left Upper Extremity Assessment LUE ROM/Strength/Tone: Within functional levels LUE Sensation: WFL - Light Touch LUE Coordination: WFL - gross/fine motor Right Lower Extremity Assessment RLE ROM/Strength/Tone: WFL for tasks assessed RLE Sensation: WFL - Light Touch Left Lower Extremity Assessment LLE ROM/Strength/Tone: Deficits LLE ROM/Strength/Tone Deficits: Deficits due to L groin pain around incision.   LLE Sensation: WFL - Light Touch Trunk Assessment Trunk Assessment: Kyphotic     Mobility Bed Mobility Bed Mobility: Sitting - Scoot to Edge of Bed;Supine to Sit;Sit to Supine Supine to Sit: 6: Modified independent (Device/Increase time);HOB flat Sitting - Scoot to Edge of Bed: 6: Modified independent (Device/Increase time) Sit to Supine: 6: Modified independent (Device/Increase time) Details for Bed Mobility Assistance: pt moves  slowly, but able to complete without A.   Transfers Sit to Stand: 5: Supervision;With upper extremity assist;From bed;From chair/3-in-1;With armrests Stand to Sit: 5: Supervision;With upper extremity assist;To chair/3-in-1;To bed;With armrests Details for Transfer Assistance: Dem[employer] good use of UEs.       Exercise     Balance Balance Balance Assessed: No   End of Session OT - End of Session Activity Tolerance: Patient tolerated treatment well Patient left: in chair;with call bell/phone within reach Nurse Communication: Mobility status;Precautions  GO     Lucile Shutters 12/30/2012, 9:04 AM Pager: [phone]

## 2012-12-30 NOTE — Evaluation (Signed)
Physical Therapy Evaluation Patient Details Name: Tim Kirby MRN: [id number] DOB: 05/08/1941 Today's Date: 12/30/2012 Time: 4098-1191 PT Time Calculation (min): 24 min  PT Assessment / Plan / Recommendation Clinical Impression  pt presents with Fem bypass graft.  pt moving well and only requiring S for mobility.  pt will have A from family at D/C, but would benefit from HHPT to maximize Independence.      PT Assessment  Patient needs continued PT services    Follow Up Recommendations  Home health PT;Supervision - Intermittent    Does the patient have the potential to tolerate intense rehabilitation      Barriers to Discharge None      Equipment Recommendations  None recommended by PT    Recommendations for Other Services     Frequency Min 3X/week    Precautions / Restrictions Precautions Precautions: Fall Restrictions Weight Bearing Restrictions: No   Pertinent Vitals/Pain Did not rate, but indicates pain in Groin around incision.        Mobility  Bed Mobility Bed Mobility: Sitting - Scoot to Edge of Bed;Supine to Sit;Sit to Supine Supine to Sit: 6: Modified independent (Device/Increase time);HOB flat Sitting - Scoot to Edge of Bed: 6: Modified independent (Device/Increase time) Sit to Supine: 6: Modified independent (Device/Increase time) Details for Bed Mobility Assistance: pt moves slowly, but able to complete without A.   Transfers Transfers: Sit to Stand;Stand to Sit Sit to Stand: 5: Supervision;With upper extremity assist;From bed;From chair/3-in-1;With armrests Stand to Sit: 5: Supervision;With upper extremity assist;To chair/3-in-1;To bed;With armrests Details for Transfer Assistance: [employer] good use of UEs.   Ambulation/Gait Ambulation/Gait Assistance: 5: Supervision Ambulation Distance (Feet): 300 Feet Assistive device: Rolling walker Ambulation/Gait Assistance Details: cues for safe use of RW, encouragement.   Gait Pattern: Step-through  pattern;Decreased stride length;Trunk flexed Stairs: No Wheelchair Mobility Wheelchair Mobility: No    Exercises     PT Diagnosis: Difficulty walking  PT Problem List: Decreased strength;Decreased range of motion;Decreased activity tolerance;Decreased balance;Decreased mobility;Decreased knowledge of use of DME;Pain PT Treatment Interventions: DME instruction;Gait training;Stair training;Functional mobility training;Therapeutic activities;Therapeutic exercise;Balance training;Patient/family education   PT Goals Acute Rehab PT Goals PT Goal Formulation: With patient Time For Goal Achievement: 01/06/13 Potential to Achieve Goals: Good Pt will go Sit to Stand: with modified independence PT Goal: Sit to Stand - Progress: Goal set today Pt will go Stand to Sit: with modified independence PT Goal: Stand to Sit - Progress: Goal set today Pt will Ambulate: >150 feet;with modified independence;with rolling walker PT Goal: Ambulate - Progress: Goal set today Pt will Go Up / Down Stairs: 1-2 stairs;with modified independence;with rolling walker PT Goal: Up/Down Stairs - Progress: Goal set today  Visit Information  Last PT Received On: 12/30/12 Assistance Needed: +1 PT/OT Co-Evaluation/Treatment: Yes    Subjective Data  Subjective: I get to meet a lot of ladies at work.   Patient Stated Goal: Back to work.     Prior Functioning  Home Living Lives With: Spouse;Other (Comment) (mother) Available Help at Discharge: Family;Available 24 hours/day Type of Home: House Home Access: Stairs to enter [employer] of Steps: 1 Entrance Stairs-Rails: None Home Layout: One level Bathroom Shower/Tub: Forensic scientist: Standard Home Adaptive Equipment: Grab bars in shower;Walker - rolling;Straight cane Prior Function Level of Independence: Independent Able to Take Stairs?: Yes Driving: Yes Vocation: Part time employment (works for [employer]) Communication Communication: No difficulties Dominant Hand: Right    Cognition  Cognition Overall Cognitive Status: Appears within  functional limits for tasks assessed/performed Arousal/Alertness: Awake/alert Orientation Level: Appears intact for tasks assessed Behavior During Session: [facility] for tasks performed    Extremity/Trunk Assessment Right Upper Extremity Assessment RUE ROM/Strength/Tone: Within functional levels RUE Sensation: WFL - Light Touch RUE Coordination: WFL - gross/fine motor Left Upper Extremity Assessment LUE ROM/Strength/Tone: Within functional levels LUE Sensation: WFL - Light Touch LUE Coordination: WFL - gross/fine motor Right Lower Extremity Assessment RLE ROM/Strength/Tone: WFL for tasks assessed RLE Sensation: WFL - Light Touch Left Lower Extremity Assessment LLE ROM/Strength/Tone: Deficits LLE ROM/Strength/Tone Deficits: Deficits due to L groin pain around incision.   LLE Sensation: WFL - Light Touch Trunk Assessment Trunk Assessment: Kyphotic   Balance Balance Balance Assessed: No  End of Session PT - End of Session Equipment Utilized During Treatment: Gait belt Activity Tolerance: Patient tolerated treatment well Patient left: in chair;with call bell/phone within reach Nurse Communication: Mobility status  GP     Sunny Schlein, [place] [phone] 12/30/2012, 9:02 AM

## 2013-01-06 ENCOUNTER — Encounter (HOSPITAL_COMMUNITY): Payer: Self-pay | Admitting: Vascular Surgery

## 2013-01-13 ENCOUNTER — Encounter: Payer: Self-pay | Admitting: Vascular Surgery

## 2013-01-14 ENCOUNTER — Encounter (INDEPENDENT_AMBULATORY_CARE_PROVIDER_SITE_OTHER): Payer: Medicare Other | Admitting: *Deleted

## 2013-01-14 ENCOUNTER — Ambulatory Visit (INDEPENDENT_AMBULATORY_CARE_PROVIDER_SITE_OTHER): Payer: Medicare Other | Admitting: Vascular Surgery

## 2013-01-14 ENCOUNTER — Encounter: Payer: Self-pay | Admitting: Vascular Surgery

## 2013-01-14 VITALS — BP 157/92 | HR 70 | Resp 20 | Ht 68.0 in | Wt 185.0 lb

## 2013-01-14 DIAGNOSIS — I724 Aneurysm of artery of lower extremity: Secondary | ICD-10-CM | POA: Insufficient documentation

## 2013-01-14 DIAGNOSIS — I739 Peripheral vascular disease, unspecified: Secondary | ICD-10-CM

## 2013-01-14 DIAGNOSIS — Z48812 Encounter for surgical aftercare following surgery on the circulatory system: Secondary | ICD-10-CM

## 2013-01-14 DIAGNOSIS — I723 Aneurysm of iliac artery: Secondary | ICD-10-CM

## 2013-01-14 NOTE — Progress Notes (Signed)
[facility] [place]  Established Previous Bypass  History of Present Illness  KEYWON MESTRE is a 72 y.o. (18-Aug-1941) male who presents for post-op visit.  Previous operation(s) include: left femoral exposure, left femoropopliteal stenting and angioplasty for popliteal aneurysm exclusion. (Date: 12-29-2012).  The patient's symptoms have improved.  The patient's symptoms are: groin incision pain. The patient's treatment regimen currently included: maximal medical management with Asprin and Plavix daily.  Past Medical History, Past Surgical History, Social History, Family History, Medications, Allergies, and Review of Systems are unchanged from previous evaluation on 12-29-2012.  Physical Examination  Filed Vitals:   01/14/13 1338  BP: 157/92  Pulse: 70  Resp: 20  Height: 5\' 8"  (1.727 m)  Weight: 185 lb (83.915 kg)   Body mass index is 28.14 kg/(m^2).  General: A&O x 3, WDWN  Pulmonary: Sym exp, good air movt, CTAB, no rales, rhonchi, & wheezing  Cardiac: RRR, Nl S1, S2, no Murmurs, rubs or gallops   Vascular: Vessel Right Left  Radial Palpable Palpable  Femoral Palpable Palpable  Popliteal Non-palpable Non-palpable  PT Not Palpable Not Palpable  DP Not Palpable Palpable   Musculoskeletal: M/S 5/5 throughout no ischemic changes.  Well healed scars in both groin incisions.  Well healed right LE multiple incisions.  Neurologic: Pain and light touch intact in extremities,  Motor exam as listed above  Non-Invasive Vascular Imaging ABI (Date: 01/14/13)  R: 0.80 (0.60), DP: Mono, PT: Mono, TBI 0.51  L: 0.83 (0.64), DP: Biphasic, PT: Biphasic, TBI 0.60  Medical Decision Making  NICKSON MIDDLESWORTH is a 72 y.o. male who presents with:  s/p left femoropopliteal stenting and angioplasty 12-29-2012.  Right fem-pop bypass with femoropopliteal aneurysm exclusion 10-18-2012.  The pt's L ABI for the recent L leg procedure demonstrates improved fluid dynamics after  popliteal aneurysm exclusion.  The R ABI reflect further calcification of the tibial vessel on the R side.  Based on the patient's vascular studies and examination, I have offered the patient: 3 month follow up bilateral ABI's, Aortoilliac duplex, and bilateral LE arterial duplexes..  I discussed in depth with the patient the nature of atherosclerosis, and emphasized the importance of maximal medical management including strict control of blood pressure, blood glucose, and lipid levels, obtaining regular exercise, and cessation of smoking.  The patient is aware that without maximal medical management the underlying atherosclerotic disease process will progress, limiting the benefit of any interventions.  I discussed in depth with the patient the need for long term surveillance to improve the primary assisted patency of his bypass.  The patient agrees to cooperate with such.  The patient is scheduled for the previous listed surveillance studies in 3 months.  Thank you for allowing [place] to participate in this patient's care.  Thomasena Edis Geanette [facility]  [facility] [place] Office: [phone]   01/14/2013, 2:49 PM  Addendum  I have independently interviewed and examined the patient, and I agree with the physician assistant's findings.  The Left foot is warm today while the R foot is viable an room temperature.  Incision in L groin is healing well.  The patient remains asx B.  I would continue with Plavix and ASA to assist patency.  The will return in 3 months for surveillance studies: ABI to monitor tibial disease progression, BLE arterial duplex to evaluate for aneurysm sacs and adequate of flow in the R fem-pop bypass and the L femoropop stenting.  The patient is at high risk  of clotting off both due to poor outflow on both sides.  Additionally, he had iliofemoral aneurysm also so bilateral Aortoiliac duplexes will be needed.  Leonides Sake, MD [facility]  of [place] Office: [phone] Pager: [phone]  01/14/2013, 5:45 PM

## 2013-04-14 ENCOUNTER — Encounter: Payer: Self-pay | Admitting: Vascular Surgery

## 2013-04-15 ENCOUNTER — Ambulatory Visit (INDEPENDENT_AMBULATORY_CARE_PROVIDER_SITE_OTHER): Payer: Medicare Other | Admitting: Vascular Surgery

## 2013-04-15 ENCOUNTER — Encounter (INDEPENDENT_AMBULATORY_CARE_PROVIDER_SITE_OTHER): Payer: Medicare Other | Admitting: *Deleted

## 2013-04-15 ENCOUNTER — Other Ambulatory Visit (INDEPENDENT_AMBULATORY_CARE_PROVIDER_SITE_OTHER): Payer: Medicare Other | Admitting: *Deleted

## 2013-04-15 ENCOUNTER — Encounter: Payer: Self-pay | Admitting: Vascular Surgery

## 2013-04-15 VITALS — BP 189/97 | HR 64 | Resp 16 | Ht 68.0 in | Wt 180.0 lb

## 2013-04-15 DIAGNOSIS — I739 Peripheral vascular disease, unspecified: Secondary | ICD-10-CM

## 2013-04-15 DIAGNOSIS — I723 Aneurysm of iliac artery: Secondary | ICD-10-CM

## 2013-04-15 DIAGNOSIS — Z48812 Encounter for surgical aftercare following surgery on the circulatory system: Secondary | ICD-10-CM

## 2013-04-15 DIAGNOSIS — I724 Aneurysm of artery of lower extremity: Secondary | ICD-10-CM

## 2013-04-15 NOTE — Progress Notes (Signed)
[facility] OF [place]  Established Previous Bypass  History of Present Illness  Tim Kirby is a 72 y.o. (January 18, 1941) male who presents with chief complaint: no complaints.  Previous operation(s) include: R CFA-BK pop BPG with pop. aneurysm ligation (Date: 10/18/12) and L viabahn exclusion of L pop aneurysm (Date: 12/29/12).  The patient's symptoms have not progressed.  The patient's symptoms are: none.  The patient's treatment regimen currently included: maximal medical management.  Past Medical History, Past Surgical History, Social History, Family History, Medications, Allergies, and Review of Systems are unchanged from previous evaluation on 01/14/13.  On ROS: swelling resolved, no intermittent claudication  Physical Examination  Filed Vitals:   04/15/13 1128  BP: 189/97  Pulse: 64  Resp: 16  Height: 5\' 8"  (1.727 m)  Weight: 180 lb (81.647 kg)  SpO2: 90%   Body mass index is 27.38 kg/(m^2).  General: A&O x 3, WDWN  Pulmonary: Sym exp, good air movt, CTAB, no rales, rhonchi, & wheezing  Cardiac: RRR, Nl S1, S2, no Murmurs, rubs or gallops  Vascular: Vessel Right Left  Radial Palpable Palpable  Brachial Palpable Palpable  Carotid Palpable, without bruit Palpable, without bruit  Aorta Non-palpable N/A  Femoral Palpable Palpable  Popliteal Non-palpable Non-palpable  PT Faintly Palpable Faintly Palpable  DP Not Palpable Not Palpable   Musculoskeletal: M/S 5/5 throughout , Extremities without ischemic changes   Neurologic: Pain and light touch intact in extremities , Motor exam as listed above  Non-Invasive Vascular Imaging ABI (Date: 04/15/2013)  R: 0.77 (0.80), DP: mono, PT: bi, TBI: 0.67  L: Bartelso (0.83), DP: mono, PT: tri, TBI: 0.79  B arterial duplex (Date: 04/15/13)  Mostly occluded R popliteal artery (trickle flow)  Patent R fem-pop bypass: 40-130 c/s  Small out-pouching on vein conduit  Excluded left popliteal aneurysm: popliteal stent  29-66 c/s  Small mobile plaque in L SFA  Aortoiliac Duplex (Date: 04/15/13)  Ao: 89-92 c/s, 2.14 cm x 2.18 cm  R iliac: 63 c/s, 1.6 cm 1.5 cm  L iliac: 79 c/s, 1.4 cm x 1.3 cm  Medical Decision Making  Tim Kirby is a 72 y.o. male who presents with: s/p B aneurysm exclusion (R surgical, L endovasc) .  Based on the patient's vascular studies and examination, I have offered the patient: q6 month surveillance with BLE ABI, BLE arterial duplex.  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  I discussed in depth with the patient the nature of atherosclerosis, and emphasized the importance of maximal medical management including strict control of blood pressure, blood glucose, and lipid levels, obtaining regular exercise, and cessation of smoking.  The patient is aware that without maximal medical management the underlying atherosclerotic disease process will progress, limiting the benefit of any interventions.  I discussed in depth with the patient the need for long term surveillance to improve the primary assisted patency of his bypass.  The patient agrees to cooperate with such.  The patient is scheduled for the previous listed surveillance studies in 6 months.  Thank you for allowing [place] to participate in this patient's care.  Leonides Sake, MD [facility] of [place] Office: [phone] Pager: [phone]  04/15/2013, 7:24 PM

## 2013-05-23 ENCOUNTER — Other Ambulatory Visit: Payer: Self-pay | Admitting: *Deleted

## 2013-05-23 DIAGNOSIS — I739 Peripheral vascular disease, unspecified: Secondary | ICD-10-CM

## 2013-05-23 MED ORDER — CLOPIDOGREL BISULFATE 75 MG PO TABS: 75.0000 mg | ORAL_TABLET | Freq: Every day | ORAL | Status: DC

## 2013-07-21 ENCOUNTER — Encounter: Payer: Self-pay | Admitting: Vascular Surgery

## 2013-07-22 ENCOUNTER — Ambulatory Visit (HOSPITAL_COMMUNITY)
Admission: RE | Admit: 2013-07-22 | Discharge: 2013-07-22 | Disposition: A | Discharge status: Home / Self Care | Payer: Medicare Other | Source: Ambulatory Visit | Attending: Vascular Surgery | Admitting: Vascular Surgery

## 2013-07-22 ENCOUNTER — Encounter: Payer: Self-pay | Admitting: Vascular Surgery

## 2013-07-22 ENCOUNTER — Ambulatory Visit (INDEPENDENT_AMBULATORY_CARE_PROVIDER_SITE_OTHER): Payer: Medicare Other | Admitting: Vascular Surgery

## 2013-07-22 ENCOUNTER — Ambulatory Visit (INDEPENDENT_AMBULATORY_CARE_PROVIDER_SITE_OTHER)
Admission: RE | Admit: 2013-07-22 | Discharge: 2013-07-22 | Disposition: A | Discharge status: Home / Self Care | Payer: Medicare Other | Source: Ambulatory Visit | Attending: Vascular Surgery | Admitting: Vascular Surgery

## 2013-07-22 VITALS — BP 147/93 | HR 66 | Ht 68.0 in | Wt 181.2 lb

## 2013-07-22 DIAGNOSIS — Z48812 Encounter for surgical aftercare following surgery on the circulatory system: Secondary | ICD-10-CM

## 2013-07-22 DIAGNOSIS — I724 Aneurysm of artery of lower extremity: Secondary | ICD-10-CM

## 2013-07-22 DIAGNOSIS — I739 Peripheral vascular disease, unspecified: Secondary | ICD-10-CM

## 2013-07-22 DIAGNOSIS — I723 Aneurysm of iliac artery: Secondary | ICD-10-CM

## 2013-07-22 NOTE — Progress Notes (Signed)
[facility] OF   Established Previous Bypass  History of Present Illness  Tim Kirby is a 72 y.o. ([birth year]) male who presents with chief complaint: no complaints. Previous operation(s) include: R CFA-BK pop BPG with pop. aneurysm ligation (Date: 10/18/12) and L Viabahn exclusion of L pop aneurysm and L proximal SFA stenting (Date: 12/29/12). The patient's symptoms have improved. The patient's symptoms are: none. The patient's treatment regimen currently included: maximal medical management.  The patient denies any embolic sx today and no groin pain.  The patient's PMH, PSH, SH, FamHx, Med, and Allergies are unchanged from 04/15/13.  On ROS today: no rest pain, no ulcers in feet  Physical Examination  Filed Vitals:   07/22/13 1112  BP: 147/93  Pulse: 66  Height: 5\' 8"  (1.727 m)  Weight: 181 lb 3.2 oz (82.192 kg)  SpO2: 98%   Body mass index is 27.56 kg/(m^2).  General: A&O x 3, WDWN   Pulmonary: Sym exp, good air movt, CTAB, no rales, rhonchi, & wheezing   Cardiac: RRR, Nl S1, S2, no Murmurs, rubs or gallops   Vascular:  Vessel  Right  Left   Radial  Palpable  Palpable   Brachial  Palpable  Palpable   Carotid  Palpable, without bruit  Palpable, without bruit   Aorta  Non-palpable  N/A   Femoral  Prominently Palpable  Palpable   Popliteal  Non-palpable  Non-palpable   PT  Faintly Palpable  Faintly Palpable   DP  Not Palpable  Not Palpable    Musculoskeletal: M/S 5/5 throughout , Extremities without ischemic changes   Neurologic: Pain and light touch intact in extremities , Motor exam as listed above   Non-Invasive Vascular Imaging  ABI (Date: 07/22/2013)  R: 0.74 (0.77), DP: mono, PT: bi, TBI: 0.60  L: Maupin (), DP: mon, PT: bi, TBI: 0.63  BLE arterial duplex (Date: 07/22/2013)  R: patent R fem-pop with excluded aneurysm  Some interval increase at proximal anastomosis (2.46 x 2.35 cm, previous 2.2 x 2.2 cm)  L: widely patent a L  Viabahn with excluded aneurysm  Medical Decision Making  Tim Kirby is a 72 y.o. male who presents with: continued successful R popliteal aneurysm exclusion and R fem-pop BPG, L PTA+S with viabahn to excluded L popliteal aneurysm, B femoral aneurysm, B iliac aneurysms   Threshold for femoral aneurysm intervention is now 3.5 cm which is also the iliac artery threshold.  Based on the patient's vascular studies and examination, I have offered the patient: BLE ABI, BLE arterial duplex, and aortoiliac duplex in 6 months.  I discussed in depth with the patient the nature of atherosclerosis, and emphasized the importance of maximal medical management including strict control of blood pressure, blood glucose, and lipid levels, obtaining regular exercise, and cessation of smoking.  The patient is aware that without maximal medical management the underlying atherosclerotic disease process will progress, limiting the benefit of any interventions.  I discussed in depth with the patient the need for long term surveillance to improve the primary assisted patency of his bypass.  The patient agrees to cooperate with such.  The patient is scheduled for the previous listed surveillance studies in 6 months.  Thank you for allowing [place] to participate in this patient's care.  Leonides Sake, MD [facility] [place] Office: [phone] Pager: [phone]  07/22/2013, 11:50 AM

## 2013-07-22 NOTE — Addendum Note (Signed)
Addended by: Sharee Pimple on: 07/22/2013 04:05 PM   Modules accepted: Orders

## 2013-08-08 ENCOUNTER — Other Ambulatory Visit: Payer: Self-pay | Admitting: Pathology

## 2014-01-19 ENCOUNTER — Encounter: Payer: Self-pay | Admitting: Vascular Surgery

## 2014-01-20 ENCOUNTER — Ambulatory Visit (HOSPITAL_COMMUNITY)
Admission: RE | Admit: 2014-01-20 | Discharge: 2014-01-20 | Disposition: A | Discharge status: Home / Self Care | Payer: Medicare Other | Source: Ambulatory Visit | Attending: Vascular Surgery | Admitting: Vascular Surgery

## 2014-01-20 ENCOUNTER — Ambulatory Visit (INDEPENDENT_AMBULATORY_CARE_PROVIDER_SITE_OTHER)
Admission: RE | Admit: 2014-01-20 | Discharge: 2014-01-20 | Disposition: A | Discharge status: Home / Self Care | Payer: Medicare Other | Source: Ambulatory Visit | Attending: Vascular Surgery | Admitting: Vascular Surgery

## 2014-01-20 ENCOUNTER — Ambulatory Visit (INDEPENDENT_AMBULATORY_CARE_PROVIDER_SITE_OTHER): Payer: Medicare Other | Admitting: Vascular Surgery

## 2014-01-20 ENCOUNTER — Encounter: Payer: Self-pay | Admitting: Vascular Surgery

## 2014-01-20 VITALS — BP 137/87 | HR 73 | Ht 68.0 in | Wt 180.0 lb

## 2014-01-20 DIAGNOSIS — Z48812 Encounter for surgical aftercare following surgery on the circulatory system: Secondary | ICD-10-CM | POA: Insufficient documentation

## 2014-01-20 DIAGNOSIS — I724 Aneurysm of artery of lower extremity: Secondary | ICD-10-CM | POA: Insufficient documentation

## 2014-01-20 DIAGNOSIS — I723 Aneurysm of iliac artery: Secondary | ICD-10-CM

## 2014-01-20 DIAGNOSIS — I739 Peripheral vascular disease, unspecified: Secondary | ICD-10-CM

## 2014-01-20 NOTE — Progress Notes (Signed)
Established Popliteal Aneurysm/PAD  History of Present Illness  Tim Kirby is a 73 y.o. (07-09-1941) male who is s/p R CFA-BK pop BPG with pop. aneurysm ligation (Date: 10/18/12) and L Viabahn exclusion of L pop aneurysm and L proximal SFA stenting (Date: 12/29/12) presents with chief complaint: routine follow up.  The patient's treatment regimen currently included: maximal medical management.  He delivers parts to garages, with a good deal of walking, denies weakness or claudication with the walking that he does, denies non healing wounds.  He denies history of stroke or TIA. Left forearm HD access, for almost 7 years, dialyzes T-TH-S.      Past Medical History  Diagnosis Date  . Hyperlipidemia   . Gout   . Secondary hyperparathyroidism   . Anemia   . Colon, diverticulosis   . Thyroid nodule   . Anginal pain     sees Dr. Lia Foyer  . Hypertension     sees Dr. Willy Eddy  . Chronic kidney disease     Tues, thurs, sat  . Aneurysm artery, popliteal    Past Surgical History  Procedure Laterality Date  . Knee surgery      Left cyst removal by Dr. Lorin Mercy  . Av fistula placement      left UA AVF  . Femoral-popliteal bypass graft  10/18/2012    Procedure: BYPASS GRAFT FEMORAL-POPLITEAL ARTERY;  Surgeon: Conrad [place], MD;  Location: [facility] OR;  Service: Vascular;  Laterality: Right;  Right Popliteal Aneurysm Exclusion; Ultrasound guided  . False aneurysm repair Left 12/29/2012    Procedure: REPAIR FALSE ANEURYSM;  Surgeon: Conrad Niland, MD;  Location: [facility];  Service: Vascular;  Laterality: Left;  . Endovascular repair of popliteal artery aneurysm Left 12/29/2012    Procedure: ENDOVASCULAR REPAIR OF POPLITEAL AND FEMORAL ARTERY ANEURYSM;  Surgeon: Conrad March ARB, MD;  Location: [facility];  Service: Vascular;  Laterality: Left;   History   Social History  . Marital Status: Married    Spouse Name: N/A    Number of Children: N/A  . Years of Education: N/A   Occupational History  . Not on file.     Social History Main Topics  . Smoking status: Former Smoker    Types: Cigarettes, Pipe    Quit date: 09/18/2007  . Smokeless tobacco: Never Used  . Alcohol Use: No  . Drug Use: No  . Sexual Activity: Not on file   Other Topics Concern  . Not on file   Social History Narrative  . No narrative on file   Family History  Problem Relation Age of Onset  . Hypertension Mother   . Diabetes Mother    Current Outpatient Prescriptions on File Prior to Visit  Medication Sig Dispense Refill  . amLODipine (NORVASC) 10 MG tablet Take 10 mg by mouth daily.      Marland Kitchen aspirin 325 MG tablet Take 325 mg by mouth daily.      Marland Kitchen b complex-vitamin c-folic acid (NEPHRO-VITE) 0.8 MG TABS Take 0.8 mg by mouth at bedtime.      . clopidogrel (PLAVIX) 75 MG tablet Take 1 tablet (75 mg total) by mouth daily.  90 tablet  3  . simvastatin (ZOCOR) 40 MG tablet Take 40 mg by mouth every evening.      Marland Kitchen oxyCODONE (OXY IR/ROXICODONE) 5 MG immediate release tablet Take 1-2 tablets (5-10 mg total) by mouth every 4 (four) hours as needed.  30 tablet  0  . sevelamer (  RENVELA) 800 MG tablet Take 3,200 mg by mouth 3 (three) times daily with meals.        No current facility-administered medications on file prior to visit.   No Known Allergies   On ROS today: see HPI for pertinent positives and negatives.  Physical Examination  Filed Vitals:   01/20/14 1123  BP: 137/87  Pulse: 73  Height: 5\' 8"  (1.727 m)  Weight: 180 lb (81.647 kg)  SpO2: 97%   Body mass index is 27.38 kg/(m^2).  General: A&O x 3, WD.  Pulmonary: Sym exp, good air movt, CTAB, no rales, rhonchi, or wheezing.  Cardiac: RRR, no Murmurs detected.  Vascular: Vessel Right Left  Radial Palpable Palpable  Carotid  without bruit without bruit  Aorta Not palpable N/A  Femoral Palpable Palpable  Popliteal Not palpable Not palpable  PT notPalpable notPalpable  DP notPalpable notPalpable   Gastrointestinal: soft, NTND, -G/R, - HSM, -  masses, - CVAT B.  Musculoskeletal: M/S 5/5 throughout, Extremities without ischemic changes.  Neurologic: Pain and light touch intact in extremities, Motor exam as listed above  Non-Invasive Vascular Imaging ABI (Date: 01/20/2014) R: 0.80 (0.74), DP: mono, PT: mono, TBI: 0.51 L: 1.60 (1.30), DP: mono, PT: bi, TBI: 0.59  B arterial duplex (Date: 01/20/2014)  R: femoral aneurysm; 2.6 cm x 2.7 cm, patent fem-pop  Graft  L: patent pop stent  Aortoiliac duplex (01/20/2014)  Aorta: 2.1 cm  R CIA: 1.7 cm x 1.6 cm  L CIA: 1.5 cm x 1.3 cm  Medical Decision Making  Tim Kirby is a 73 y.o. male who is s/p R CFA-BK pop BPG with pop. aneurysm ligation (Date: 10/18/12) and L Viabahn exclusion of L pop aneurysm and L proximal SFA stenting (Date: 12/29/12).   Based on the patient's vascular studies and examination, and after discussing with Dr. Bridgett Larsson, I have offered the patient: AAA Duplex, bilateral LE arterial Duplex, and ABI's.  I discussed in depth with the patient the nature of atherosclerosis, and emphasized the importance of maximal medical management including strict control of blood pressure, blood glucose, and lipid levels, antiplatelet agents, obtaining regular exercise, and cessation of smoking.    The patient is aware that without maximal medical management the underlying atherosclerotic disease process will progress, limiting the benefit of any interventions. The patient is currently on a statin: Zocor. The patient is currently on an anti-platelet: Plavix.  Thank you for allowing [place] to participate in this patient's care.  Clemon Chambers, RN, MSN, FNP-C [facility] [place] Office: [phone]  Clinic MD: Bridgett Larsson  01/20/2014, 11:28 AM  Addendum  I have independently interviewed and examined the patient, and I agree with my NP's findings.  Pt currently is asx with stable blood flow to both feet.  The R femoral aneurysm may require replacement of the  CFA at some point in the future.  Given this patient's significant calciphylaxis, any surgical intervention will likely be very difficult, hence the Viabahn exclusion of the L popliteal artery.  Adele Barthel, MD [facility] [place] Office: [phone] Pager: [phone]  01/20/2014, 6:14 PM

## 2014-01-23 NOTE — Addendum Note (Signed)
Addended by: Dorthula Rue L on: 01/23/2014 03:01 PM   Modules accepted: Orders

## 2014-06-02 ENCOUNTER — Other Ambulatory Visit: Payer: Self-pay | Admitting: *Deleted

## 2014-06-02 DIAGNOSIS — I739 Peripheral vascular disease, unspecified: Secondary | ICD-10-CM

## 2014-06-02 MED ORDER — CLOPIDOGREL BISULFATE 75 MG PO TABS: 75.0000 mg | ORAL_TABLET | Freq: Every day | ORAL | Status: DC

## 2014-07-17 ENCOUNTER — Other Ambulatory Visit: Payer: Self-pay | Admitting: *Deleted

## 2014-07-17 DIAGNOSIS — I714 Abdominal aortic aneurysm, without rupture, unspecified: Secondary | ICD-10-CM

## 2014-07-17 DIAGNOSIS — Z48812 Encounter for surgical aftercare following surgery on the circulatory system: Secondary | ICD-10-CM

## 2014-07-27 ENCOUNTER — Encounter: Payer: Self-pay | Admitting: Vascular Surgery

## 2014-07-28 ENCOUNTER — Ambulatory Visit (HOSPITAL_COMMUNITY)
Admission: RE | Admit: 2014-07-28 | Discharge: 2014-07-28 | Disposition: A | Discharge status: Home / Self Care | Payer: Medicare Other | Source: Ambulatory Visit | Attending: Vascular Surgery | Admitting: Vascular Surgery

## 2014-07-28 ENCOUNTER — Ambulatory Visit (INDEPENDENT_AMBULATORY_CARE_PROVIDER_SITE_OTHER)
Admission: RE | Admit: 2014-07-28 | Discharge: 2014-07-28 | Disposition: A | Discharge status: Home / Self Care | Payer: Medicare Other | Source: Ambulatory Visit | Attending: Vascular Surgery | Admitting: Vascular Surgery

## 2014-07-28 ENCOUNTER — Encounter: Payer: Self-pay | Admitting: Vascular Surgery

## 2014-07-28 ENCOUNTER — Ambulatory Visit (INDEPENDENT_AMBULATORY_CARE_PROVIDER_SITE_OTHER): Payer: Medicare Other | Admitting: Vascular Surgery

## 2014-07-28 VITALS — BP 160/90 | HR 72 | Ht 68.0 in | Wt 187.0 lb

## 2014-07-28 DIAGNOSIS — I714 Abdominal aortic aneurysm, without rupture, unspecified: Secondary | ICD-10-CM

## 2014-07-28 DIAGNOSIS — Z48812 Encounter for surgical aftercare following surgery on the circulatory system: Secondary | ICD-10-CM

## 2014-07-28 DIAGNOSIS — I724 Aneurysm of artery of lower extremity: Secondary | ICD-10-CM | POA: Diagnosis not present

## 2014-07-28 DIAGNOSIS — I723 Aneurysm of iliac artery: Secondary | ICD-10-CM | POA: Diagnosis not present

## 2014-07-28 NOTE — Addendum Note (Signed)
Addended by: Peter Minium K on: 07/28/2014 04:00 PM   Modules accepted: Orders

## 2014-07-28 NOTE — Progress Notes (Addendum)
Established Previous Bypass   History of Present Illness  Tim Kirby is a 74 y.o. male  who presents with chief complaint: no complaints. Previous operation(s) include: R CFA-BK pop BPG with pop. aneurysm ligation (Date: 10/18/12) and L Viabahn exclusion of L pop aneurysm and L proximal SFA stenting (Date: 12/29/12). The patient's symptoms are unchanged. The patient's symptoms are: none. The patient's treatment regimen currently included: maximal medical management.   The patient's PMH, PSH, SH, FamHx, Med, and Allergies are unchanged from 01/20/14  On ROS today: no rest pain, no gangrene or ulcers in either feet   Physical Examination   Filed Vitals:   07/28/14 1036  BP: 160/90  Pulse: 72  Height: 5\' 8"  (1.727 m)  Weight: 187 lb (84.823 kg)  SpO2: 98%   Body mass index is 28.44 kg/(m^2).   General: A&O x 3, WDWN   Pulmonary: Sym exp, good air movt, CTAB, no rales, rhonchi, & wheezing   Cardiac: RRR, Nl S1, S2, no Murmurs, rubs or gallops   Vascular:  Vessel  Right  Left   Radial  Palpable  Palpable   Brachial  Palpable  Palpable   Carotid  Palpable, without bruit  Palpable, without bruit   Aorta  Non-palpable  N/A   Femoral  Prominently Palpable  Palpable   Popliteal  Non-palpable  Non-palpable   PT  Not Palpable  Faintly Palpable   DP  Not Palpable  Not Palpable    Musculoskeletal: M/S 5/5 throughout , Extremities without ischemic changes , all incisions well healed  Neurologic: Pain and light touch intact in extremities , Motor exam as listed above   Non-Invasive Vascular Imaging   ABI (Date: 07/28/2014)  R: 0.75 (0.80), DP: mono, PT: mono, TBI: 0.53  L: 1.40 (0.70), DP: mono, PT: bi, TBI: 0.57    BLE arterial duplex (Date: 07/28/2014 )  R: patent R fem-pop with excluded aneurysm  R CFA: 2.6 cm x 2.7 cm L: widely patent SFA and pop stents x 2 ,  Successful exclusion of both pop aneurysms  Aortoiliac duplex (07/28/2014)   Ao: 2.7 cm x 2.7 cm  R  CIA: 2.7 cm  L CIA: 1.4 cm x 1.2 cm  Medical Decision Making  Tim Kirby is a 72 y.o. male  who presents with: continued successful R popliteal aneurysm exclusion and R fem-pop BPG, L PTA+S with viabahn to excluded L popliteal aneurysm, B femoral aneurysm, B iliac aneurysms   Blood flow is stable in both legs with successful exclusion of both popliteal aneurysm.  Neither CIA aneurysms nor femoral aneurysms need any immediate intervention Threshold for femoral aneurysm intervention is now 3.5 cm which is also the iliac artery threshold.  Based on the patient's vascular studies and examination, I have offered the patient: BLE ABI, BLE arterial duplex, and aortoiliac duplex in 6 months. I discussed in depth with the patient the nature of atherosclerosis, and emphasized the importance of maximal medical management including strict control of blood pressure, blood glucose, and lipid levels, obtaining regular exercise, and cessation of smoking. The patient is aware that without maximal medical management the underlying atherosclerotic disease process will progress, limiting the benefit of any interventions.  I discussed in depth with the patient the need for long term surveillance to improve the primary assisted patency of his bypass. The patient agrees to cooperate with such.  The patient is scheduled for the previous listed surveillance studies in 6 months.  The patient is currently on  a statin: Zocor. The patient is currently on an anti-platelet: Plavix. Thank you for allowing [place] to participate in this patient's care.  Adele Barthel, MD [facility] [place] Office: [phone] Pager: [phone]  07/28/2014, 12:29 PM

## 2014-09-14 ENCOUNTER — Encounter (HOSPITAL_COMMUNITY): Payer: Self-pay | Admitting: Vascular Surgery

## 2015-01-02 ENCOUNTER — Encounter (HOSPITAL_COMMUNITY): Payer: Self-pay | Admitting: General Practice

## 2015-01-02 ENCOUNTER — Observation Stay (HOSPITAL_COMMUNITY)
Admission: AD | Admit: 2015-01-02 | Discharge: 2015-01-03 | Disposition: A | Discharge status: Home / Self Care | Payer: Medicare Other | Source: Other Acute Inpatient Hospital | Attending: Internal Medicine | Admitting: Internal Medicine

## 2015-01-02 DIAGNOSIS — D649 Anemia, unspecified: Secondary | ICD-10-CM | POA: Diagnosis not present

## 2015-01-02 DIAGNOSIS — M109 Gout, unspecified: Secondary | ICD-10-CM | POA: Insufficient documentation

## 2015-01-02 DIAGNOSIS — Z79899 Other long term (current) drug therapy: Secondary | ICD-10-CM | POA: Insufficient documentation

## 2015-01-02 DIAGNOSIS — R079 Chest pain, unspecified: Secondary | ICD-10-CM | POA: Diagnosis present

## 2015-01-02 DIAGNOSIS — R1110 Vomiting, unspecified: Secondary | ICD-10-CM | POA: Insufficient documentation

## 2015-01-02 DIAGNOSIS — E8770 Fluid overload, unspecified: Secondary | ICD-10-CM | POA: Diagnosis present

## 2015-01-02 DIAGNOSIS — Z7982 Long term (current) use of aspirin: Secondary | ICD-10-CM | POA: Insufficient documentation

## 2015-01-02 DIAGNOSIS — I10 Essential (primary) hypertension: Secondary | ICD-10-CM | POA: Diagnosis present

## 2015-01-02 DIAGNOSIS — J209 Acute bronchitis, unspecified: Secondary | ICD-10-CM | POA: Diagnosis present

## 2015-01-02 DIAGNOSIS — I209 Angina pectoris, unspecified: Secondary | ICD-10-CM | POA: Diagnosis not present

## 2015-01-02 DIAGNOSIS — J40 Bronchitis, not specified as acute or chronic: Principal | ICD-10-CM | POA: Insufficient documentation

## 2015-01-02 DIAGNOSIS — J9601 Acute respiratory failure with hypoxia: Secondary | ICD-10-CM | POA: Diagnosis not present

## 2015-01-02 DIAGNOSIS — Z23 Encounter for immunization: Secondary | ICD-10-CM | POA: Insufficient documentation

## 2015-01-02 DIAGNOSIS — Z7902 Long term (current) use of antithrombotics/antiplatelets: Secondary | ICD-10-CM | POA: Insufficient documentation

## 2015-01-02 DIAGNOSIS — I724 Aneurysm of artery of lower extremity: Secondary | ICD-10-CM | POA: Insufficient documentation

## 2015-01-02 DIAGNOSIS — N186 End stage renal disease: Secondary | ICD-10-CM | POA: Diagnosis not present

## 2015-01-02 DIAGNOSIS — E8779 Other fluid overload: Secondary | ICD-10-CM | POA: Diagnosis not present

## 2015-01-02 DIAGNOSIS — Z87891 Personal history of nicotine dependence: Secondary | ICD-10-CM | POA: Insufficient documentation

## 2015-01-02 DIAGNOSIS — I739 Peripheral vascular disease, unspecified: Secondary | ICD-10-CM | POA: Diagnosis present

## 2015-01-02 DIAGNOSIS — I120 Hypertensive chronic kidney disease with stage 5 chronic kidney disease or end stage renal disease: Secondary | ICD-10-CM | POA: Diagnosis not present

## 2015-01-02 DIAGNOSIS — E785 Hyperlipidemia, unspecified: Secondary | ICD-10-CM | POA: Insufficient documentation

## 2015-01-02 LAB — INFLUENZA PANEL BY PCR (TYPE A & B)
H1N1FLUPCR: NOT DETECTED
Influenza A By PCR: NEGATIVE
Influenza B By PCR: NEGATIVE

## 2015-01-02 LAB — PHOSPHORUS: Phosphorus: 5.1 mg/dL — ABNORMAL HIGH (ref 2.3–4.6)

## 2015-01-02 LAB — URINALYSIS, ROUTINE W REFLEX MICROSCOPIC
BILIRUBIN URINE: NEGATIVE
Glucose, UA: NEGATIVE mg/dL
HGB URINE DIPSTICK: NEGATIVE
Ketones, ur: NEGATIVE mg/dL
LEUKOCYTES UA: NEGATIVE
NITRITE: NEGATIVE
PROTEIN: 100 mg/dL — AB
SPECIFIC GRAVITY, URINE: 1.012 (ref 1.005–1.030)
Urobilinogen, UA: 0.2 mg/dL (ref 0.0–1.0)
pH: 8.5 — ABNORMAL HIGH (ref 5.0–8.0)

## 2015-01-02 LAB — CREATININE, SERUM
CREATININE: 10.12 mg/dL — AB (ref 0.50–1.35)
GFR calc Af Amer: 5 mL/min — ABNORMAL LOW (ref >90)
GFR, EST NON AFRICAN AMERICAN: 4 mL/min — AB (ref >90)

## 2015-01-02 LAB — URINE MICROSCOPIC-ADD ON

## 2015-01-02 LAB — MAGNESIUM: Magnesium: 2.1 mg/dL (ref 1.5–2.5)

## 2015-01-02 MED ORDER — AMLODIPINE BESYLATE 5 MG PO TABS
5.0000 mg | ORAL_TABLET | Freq: Every day | ORAL | Status: DC
  Administered 2015-01-03: 5 mg via ORAL
  Filled 2015-01-02: qty 1

## 2015-01-02 MED ORDER — HEPARIN SODIUM (PORCINE) 5000 UNIT/ML IJ SOLN
5000.0000 [IU] | Freq: Three times a day (TID) | INTRAMUSCULAR | Status: DC
  Administered 2015-01-02 – 2015-01-03 (×2): 5000 [IU] via SUBCUTANEOUS
  Filled 2015-01-02 (×3): qty 1

## 2015-01-02 MED ORDER — ACETAMINOPHEN 650 MG RE SUPP: 650.0000 mg | Freq: Four times a day (QID) | RECTAL | Status: DC | PRN

## 2015-01-02 MED ORDER — PENTAFLUOROPROP-TETRAFLUOROETH EX AERO: 1.0000 "application " | INHALATION_SPRAY | CUTANEOUS | Status: DC | PRN

## 2015-01-02 MED ORDER — SODIUM CHLORIDE 0.9 % IJ SOLN: 3.0000 mL | INTRAMUSCULAR | Status: DC | PRN

## 2015-01-02 MED ORDER — LIDOCAINE HCL (PF) 1 % IJ SOLN: 5.0000 mL | INTRAMUSCULAR | Status: DC | PRN

## 2015-01-02 MED ORDER — METOPROLOL TARTRATE 25 MG PO TABS
25.0000 mg | ORAL_TABLET | Freq: Two times a day (BID) | ORAL | Status: DC
  Administered 2015-01-02 – 2015-01-03 (×2): 25 mg via ORAL
  Filled 2015-01-02 (×4): qty 1

## 2015-01-02 MED ORDER — SODIUM CHLORIDE 0.9 % IV SOLN: 100.0000 mL | INTRAVENOUS | Status: DC | PRN

## 2015-01-02 MED ORDER — AMLODIPINE BESYLATE 10 MG PO TABS
10.0000 mg | ORAL_TABLET | Freq: Every day | ORAL | Status: DC
  Filled 2015-01-02: qty 1

## 2015-01-02 MED ORDER — ONDANSETRON HCL 4 MG/2ML IJ SOLN: 4.0000 mg | Freq: Four times a day (QID) | INTRAMUSCULAR | Status: DC | PRN

## 2015-01-02 MED ORDER — GUAIFENESIN ER 600 MG PO TB12
1200.0000 mg | ORAL_TABLET | Freq: Two times a day (BID) | ORAL | Status: DC
  Administered 2015-01-02 – 2015-01-03 (×2): 1200 mg via ORAL
  Filled 2015-01-02 (×4): qty 2

## 2015-01-02 MED ORDER — DEXTROSE 5 % IV SOLN
500.0000 mg | INTRAVENOUS | Status: DC
  Administered 2015-01-02: 500 mg via INTRAVENOUS
  Filled 2015-01-02 (×2): qty 500

## 2015-01-02 MED ORDER — DEXTROMETHORPHAN POLISTIREX 30 MG/5ML PO LQCR
30.0000 mg | Freq: Two times a day (BID) | ORAL | Status: DC
  Administered 2015-01-02 – 2015-01-03 (×2): 30 mg via ORAL
  Filled 2015-01-02 (×4): qty 5

## 2015-01-02 MED ORDER — SUCROFERRIC OXYHYDROXIDE 500 MG PO CHEW
500.0000 mg | CHEWABLE_TABLET | Freq: Three times a day (TID) | ORAL | Status: DC
  Administered 2015-01-02 – 2015-01-03 (×3): 500 mg via ORAL
  Filled 2015-01-02 (×6): qty 1

## 2015-01-02 MED ORDER — CLOPIDOGREL BISULFATE 75 MG PO TABS
75.0000 mg | ORAL_TABLET | Freq: Every day | ORAL | Status: DC
  Administered 2015-01-02 – 2015-01-03 (×2): 75 mg via ORAL
  Filled 2015-01-02 (×2): qty 1

## 2015-01-02 MED ORDER — NEPRO/CARBSTEADY PO LIQD
237.0000 mL | ORAL | Status: DC | PRN
  Filled 2015-01-02: qty 237

## 2015-01-02 MED ORDER — IPRATROPIUM-ALBUTEROL 0.5-2.5 (3) MG/3ML IN SOLN
RESPIRATORY_TRACT | Status: AC
  Administered 2015-01-02: 3 mL
  Filled 2015-01-02: qty 3

## 2015-01-02 MED ORDER — HEPARIN SODIUM (PORCINE) 1000 UNIT/ML DIALYSIS: 1000.0000 [IU] | INTRAMUSCULAR | Status: DC | PRN

## 2015-01-02 MED ORDER — IPRATROPIUM-ALBUTEROL 0.5-2.5 (3) MG/3ML IN SOLN
3.0000 mL | Freq: Four times a day (QID) | RESPIRATORY_TRACT | Status: DC
  Administered 2015-01-02: 3 mL via RESPIRATORY_TRACT
  Filled 2015-01-02: qty 3

## 2015-01-02 MED ORDER — LIDOCAINE-PRILOCAINE 2.5-2.5 % EX CREA
1.0000 "application " | TOPICAL_CREAM | CUTANEOUS | Status: DC | PRN
  Filled 2015-01-02: qty 5

## 2015-01-02 MED ORDER — HYDROCODONE-ACETAMINOPHEN 5-325 MG PO TABS
1.0000 | ORAL_TABLET | Freq: Four times a day (QID) | ORAL | Status: DC | PRN
  Administered 2015-01-02: 1 via ORAL
  Filled 2015-01-02: qty 1

## 2015-01-02 MED ORDER — PNEUMOCOCCAL VAC POLYVALENT 25 MCG/0.5ML IJ INJ
0.5000 mL | INJECTION | INTRAMUSCULAR | Status: AC
  Administered 2015-01-03: 0.5 mL via INTRAMUSCULAR
  Filled 2015-01-02: qty 0.5

## 2015-01-02 MED ORDER — SODIUM CHLORIDE 0.9 % IJ SOLN: 3.0000 mL | Freq: Two times a day (BID) | INTRAMUSCULAR | Status: DC

## 2015-01-02 MED ORDER — ATORVASTATIN CALCIUM 20 MG PO TABS
20.0000 mg | ORAL_TABLET | Freq: Every day | ORAL | Status: DC
  Administered 2015-01-03: 20 mg via ORAL
  Filled 2015-01-02 (×3): qty 1

## 2015-01-02 MED ORDER — SODIUM CHLORIDE 0.9 % IJ SOLN
3.0000 mL | Freq: Two times a day (BID) | INTRAMUSCULAR | Status: DC
  Administered 2015-01-02 – 2015-01-03 (×3): 3 mL via INTRAVENOUS

## 2015-01-02 MED ORDER — DOCUSATE SODIUM 100 MG PO CAPS
100.0000 mg | ORAL_CAPSULE | Freq: Two times a day (BID) | ORAL | Status: DC
  Administered 2015-01-02 – 2015-01-03 (×2): 100 mg via ORAL
  Filled 2015-01-02 (×3): qty 1

## 2015-01-02 MED ORDER — POLYETHYLENE GLYCOL 3350 17 G PO PACK
17.0000 g | PACK | Freq: Every day | ORAL | Status: DC
  Administered 2015-01-02 – 2015-01-03 (×2): 17 g via ORAL
  Filled 2015-01-02 (×2): qty 1

## 2015-01-02 MED ORDER — ONDANSETRON HCL 4 MG PO TABS: 4.0000 mg | ORAL_TABLET | Freq: Four times a day (QID) | ORAL | Status: DC | PRN

## 2015-01-02 MED ORDER — FUROSEMIDE 10 MG/ML IJ SOLN: 80.0000 mg | Freq: Once | INTRAMUSCULAR | Status: DC

## 2015-01-02 MED ORDER — ACETAMINOPHEN 325 MG PO TABS: 650.0000 mg | ORAL_TABLET | Freq: Four times a day (QID) | ORAL | Status: DC | PRN

## 2015-01-02 MED ORDER — IPRATROPIUM-ALBUTEROL 0.5-2.5 (3) MG/3ML IN SOLN: 3.0000 mL | Freq: Three times a day (TID) | RESPIRATORY_TRACT | Status: DC

## 2015-01-02 MED ORDER — ALTEPLASE 2 MG IJ SOLR
2.0000 mg | Freq: Once | INTRAMUSCULAR | Status: DC | PRN
  Filled 2015-01-02: qty 2

## 2015-01-02 MED ORDER — SODIUM CHLORIDE 0.9 % IV SOLN: 250.0000 mL | INTRAVENOUS | Status: DC | PRN

## 2015-01-02 MED ORDER — HEPARIN SODIUM (PORCINE) 1000 UNIT/ML DIALYSIS
5000.0000 [IU] | Freq: Once | INTRAMUSCULAR | Status: DC
  Filled 2015-01-02: qty 5

## 2015-01-02 MED ORDER — CEFTRIAXONE SODIUM IN DEXTROSE 20 MG/ML IV SOLN
1.0000 g | INTRAVENOUS | Status: DC
  Administered 2015-01-02 – 2015-01-03 (×2): 1 g via INTRAVENOUS
  Filled 2015-01-02 (×2): qty 50

## 2015-01-02 MED ORDER — SIMVASTATIN 40 MG PO TABS
40.0000 mg | ORAL_TABLET | Freq: Every day | ORAL | Status: DC
Start: 2015-01-02 — End: 2015-01-02
  Filled 2015-01-02: qty 1

## 2015-01-02 NOTE — Progress Notes (Signed)
NURSING PROGRESS NOTE  JOHNRYAN SAO [id number] Admission Data: 01/02/2015 3:13 PM Attending Provider: Louellen Molder, MD BUL:AGTX,MIWOEH W, MD Code Status: full  IZIC STFORT is a 74 y.o. male patient admitted from ED:  -No acute distress noted.  -No complaints of shortness of breath.  -No complaints of chest pain.   Cardiac Monitoring: Box # 5 in place. Cardiac monitor yields:normal sinus rhythm.  Blood pressure 165/88, pulse 100, temperature 98.9 F (37.2 C), temperature source Oral, resp. rate 26, weight 87.5 kg (192 lb 14.4 oz), SpO2 97 %.   IV Fluids:  IV in place, occlusive dsg intact without redness, IV cath hand right, condition patent and no redness none.   Allergies:  Review of patient's allergies indicates no known allergies.  Past Medical History:   has a past medical history of Hyperlipidemia; Gout; Secondary hyperparathyroidism; Anemia; Colon, diverticulosis; Thyroid nodule; Anginal pain; Hypertension; Chronic kidney disease; and Aneurysm artery, popliteal.  Past Surgical History:   has past surgical history that includes Knee surgery; AV fistula placement; Femoral-popliteal Bypass Graft (10/18/2012); False aneurysm repair (Left, 12/29/2012); Endovascular repair of popliteal artery aneurysm (Left, 12/29/2012); and abdominal aortagram (N/A, 09/27/2012).  Social History:   reports that he quit smoking about 7 years ago. His smoking use included Cigarettes and Pipe. He has never used smokeless tobacco. He reports that he does not drink alcohol or use illicit drugs.  Skin: intact  Patient/Family orientated to room. Information packet given to patient/family. Admission inpatient armband information verified with patient/family to include name and date of birth and placed on patient arm. Side rails up x 2, fall assessment and education completed with patient/family. Patient/family able to verbalize understanding of risk associated with falls and verbalized understanding to  call for assistance before getting out of bed. Call light within reach. Patient/family able to voice and demonstrate understanding of unit orientation instructions.    Will continue to evaluate and treat per MD orders.

## 2015-01-02 NOTE — Procedures (Signed)
Patient was seen on dialysis and the procedure was supervised.  BFR 450  Via AVF BP is  156/86.   Patient appears to be tolerating treatment well- challenging volume   Tim Kirby A 01/02/2015

## 2015-01-02 NOTE — Progress Notes (Signed)
Assumed care of pt, written report received.

## 2015-01-02 NOTE — Consult Note (Signed)
[facility] Renal Consultation Note    Indication for Consultation:  Management of ESRD/hemodialysis; anemia, hypertension/volume and secondary hyperparathyroidism  HPI: Tim Kirby is a 74 y.o. male with ESRD, HTN, PVD, hyperlipidemia, thyroid nodule who attends [facility] in [place].  He presented to the [facility] ED early this am with SOB since last night and cough productive for some time.  He has seen Dr. Alcide Clever 4 days ago and diagnosed with viral syndrome.  He last dialyzed Saturday with small UF of 1.4 and left BELOW his EDW at 84.5. He was afebrile with post BP of 119/83 sitting and 113/89 standing - somewhat lower than usual post BP of 426 - 834 systolic.  EKG in the [facility] ED showed NSR, He was afebrile WBC 9.7 with 78.5% Neut, ABG showed PH 7.47 pO2 63 and pCO2 41 Trop 0.03. BNP > 35K Chemistries were un remarkable. CXR shoed pulmonary edema, bilateral central airspace opacities.  He was transferred to [facility] for admission due to the need for hemodialysis. He has had no N, V, D, known fever or chills. He has had some chest pressure, but not pain per se. Told me he thought his EDW had been taken up- he noticed they had been pulling him under but he thinks that may be too low.  Had had these mouth tremors before but not for this long   Past Medical History  Diagnosis Date  . Hyperlipidemia   . Gout   . Secondary hyperparathyroidism   . Anemia   . Colon, diverticulosis   . Thyroid nodule   . Anginal pain     sees Dr. Lia Foyer  . Hypertension     sees Dr. Willy Eddy  . Chronic kidney disease     Tues, thurs, sat  . Aneurysm artery, popliteal    Past Surgical History  Procedure Laterality Date  . Knee surgery      Left cyst removal by Dr. Lorin Mercy  . Av fistula placement      left UA AVF  . Femoral-popliteal bypass graft  10/18/2012    Procedure: BYPASS GRAFT FEMORAL-POPLITEAL ARTERY;  Surgeon: Conrad Eastwood, MD;  Location: [facility] OR;  Service: Vascular;  Laterality:  Right;  Right Popliteal Aneurysm Exclusion; Ultrasound guided  . False aneurysm repair Left 12/29/2012    Procedure: REPAIR FALSE ANEURYSM;  Surgeon: Conrad Fairfield, MD;  Location: [place];  Service: Vascular;  Laterality: Left;  . Endovascular repair of popliteal artery aneurysm Left 12/29/2012    Procedure: ENDOVASCULAR REPAIR OF POPLITEAL AND FEMORAL ARTERY ANEURYSM;  Surgeon: Conrad , MD;  Location: [place];  Service: Vascular;  Laterality: Left;  . Abdominal aortagram N/A 09/27/2012    Procedure: ABDOMINAL Maxcine Ham;  Surgeon: Angelia Mould, MD;  Location: [facility] CATH LAB;  Service: Cardiovascular;  Laterality: N/A;   Family History  Problem Relation Age of Onset  . Hypertension Mother   . Diabetes Mother    Social History:  reports that he quit smoking about 7 years ago. His smoking use included Cigarettes and Pipe. He has never used smokeless tobacco. He reports that he does not drink alcohol or use illicit drugs. No Known Allergies Prior to Admission medications   Medication Sig Start Date End Date Taking? Authorizing Provider  amLODipine (NORVASC) 10 MG tablet Take 10 mg by mouth daily.    Historical Provider, MD  aspirin 325 MG tablet Take 325 mg by mouth daily.    Historical Provider, MD  b complex-vitamin  c-folic acid (NEPHRO-VITE) 0.8 MG TABS Take 0.8 mg by mouth at bedtime.    Historical Provider, MD  clopidogrel (PLAVIX) 75 MG tablet Take 1 tablet (75 mg total) by mouth daily. 06/02/14   Conrad Rio, MD  lisinopril (PRINIVIL,ZESTRIL) 10 MG tablet Take 1 tablet by mouth daily. 01/03/14   Historical Provider, MD  oxyCODONE (OXY IR/ROXICODONE) 5 MG immediate release tablet Take 1-2 tablets (5-10 mg total) by mouth every 4 (four) hours as needed. 12/30/12   Ulyses Amor, PA-C  sevelamer (RENVELA) 800 MG tablet Take 3,200 mg by mouth 3 (three) times daily with meals.     Historical Provider, MD  simvastatin (ZOCOR) 40 MG tablet Take 40 mg by mouth every evening.    Historical  Provider, MD  sucroferric oxyhydroxide (VELPHORO) 500 MG chewable tablet Chew 500 mg by mouth 3 (three) times daily with meals.    Historical Provider, MD   Current Facility-Administered Medications  Medication Dose Route Frequency Provider Last Rate Last Dose  . 0.9 %  sodium chloride infusion  250 mL Intravenous PRN Melton Alar, PA-C      . acetaminophen (TYLENOL) tablet 650 mg  650 mg Oral Q6H PRN Melton Alar, PA-C       Or  . acetaminophen (TYLENOL) suppository 650 mg  650 mg Rectal Q6H PRN Melton Alar, PA-C      . amLODipine (NORVASC) tablet 10 mg  10 mg Oral Daily Melton Alar, PA-C      . azithromycin (ZITHROMAX) 500 mg in dextrose 5 % 250 mL IVPB  500 mg Intravenous Q24H Bobby Rumpf York, PA-C      . cefTRIAXone (ROCEPHIN) 1 g in dextrose 5 % 50 mL IVPB - Premix  1 g Intravenous Q24H Melton Alar, PA-C      . clopidogrel (PLAVIX) tablet 75 mg  75 mg Oral Daily Melton Alar, PA-C      . dextromethorphan (DELSYM) 30 MG/5ML liquid 30 mg  30 mg Oral BID Melton Alar, PA-C      . docusate sodium (COLACE) capsule 100 mg  100 mg Oral BID Melton Alar, PA-C      . guaiFENesin (MUCINEX) 12 hr tablet 1,200 mg  1,200 mg Oral BID Bobby Rumpf York, PA-C      . heparin injection 5,000 Units  5,000 Units Subcutaneous 3 times per day Melton Alar, PA-C      . HYDROcodone-acetaminophen (NORCO/VICODIN) 5-325 MG per tablet 1 tablet  1 tablet Oral Q6H PRN Melton Alar, PA-C      . ipratropium-albuterol (DUONEB) 0.5-2.5 (3) MG/3ML nebulizer solution 3 mL  3 mL Nebulization Q6H Marianne L York, PA-C      . ipratropium-albuterol (DUONEB) 0.5-2.5 (3) MG/3ML nebulizer solution           . metoprolol tartrate (LOPRESSOR) tablet 25 mg  25 mg Oral BID Melton Alar, PA-C      . ondansetron [facility]) tablet 4 mg  4 mg Oral Q6H PRN Melton Alar, PA-C       Or  . ondansetron (ZOFRAN) injection 4 mg  4 mg Intravenous Q6H PRN Melton Alar, PA-C      . polyethylene glycol (MIRALAX  / GLYCOLAX) packet 17 g  17 g Oral Daily Melton Alar, PA-C      . simvastatin (ZOCOR) tablet 40 mg  40 mg Oral q1800 Marianne L York, PA-C      . sodium chloride  0.9 % injection 3 mL  3 mL Intravenous Q12H Marianne L York, PA-C      . sodium chloride 0.9 % injection 3 mL  3 mL Intravenous Q12H Marianne L York, PA-C      . sodium chloride 0.9 % injection 3 mL  3 mL Intravenous PRN Melton Alar, PA-C      . sucroferric oxyhydroxide [facility]) chewable tablet 500 mg  500 mg Oral TID WC Melton Alar, PA-C       Labs: from [facility] as per HPI plus Na 140  K 3.9 glu 88 Ca 9.2 LFT wnl Alb 4.1 Hgb 10.3 platelets 183  ROS: As per HPI otherwise negative.  Physical Exam: Filed Vitals:   01/02/15 0900  BP: 160/92  Pulse: 88  Temp: 98.1 F (36.7 C)  TempSrc: Oral  Resp: 20  SpO2: 92%     General: Well developed, well nourished AAM, visibly SOB ^ respirations with talking Head: Normocephalic, atraumatic, sclera non-icteric, mucus membranes are moist Neck: Supple. JVD not elevated. Lungs:  Dim BS poor exchange - bilateral crackles Heart:  RRR  Abdomen: Soft, non-tender obese -+BS M-S:  Strength and tone appear normal for age. Lower extremities: without edema or ischemic changes, no open wounds, somewhat tender to palpation- trace edema Neuro: Alert and oriented X 3. Moves all extremities spontaneously. Psych:  Responds to questions appropriately with a normal affect. Dialysis Access: AVF left lower + bruit  Dialysis Orders: Center: [facility] TT@ 160 EDW 85 2K 2Ca 450/A 1.5 left lower AVF heparin 5000 Mircera 50 mcq q 4 weeks - last given 3/8 no Fe or Hectorol;  Recent labs: Hgb 10.2 3/24 - stable 31% sat 3/24 Ca 9.4 corrected P 5.8 iPTH 1894  Assessment/Plan: 1. SOB secondary to pulmonary edema and PNA -  R/o cardiology component - work up per primary- received levaquin, ceftriaxone, albuterol at [facility] - changed to [employer] and ceftriaxone here; check repeat CXR in the am and  decide on further HD for ultrafiltration 2. ESRD -  TTS - very compliant with dialysis - today CXR vascular congrestion - pt very SOB, weak - HD today - challenge volume- will set for 4 liters today- told him we need to establish a new EDW- will take norvasc down in preparation 3. Hypertension/volume  - Has been getting to or below EDW at outpt HD with pre BP of around 160 /100 and post BT 113/89  (Friday) 092 - 330Q systolic with weight gains 1-3 L. Runs full treatments; on amlodipine 10 and metoprolol 25 bid; challenge volume while here. Take norvasc down to 5 mg 4. Anemia  - Hgb stable in the 10s - lat Mircera 50 on 3/8 q 4 weeks - due to be redosed next week - watch CBC 5. Metabolic bone disease -  Poorly controlled.  Has been off Hectorol since October due to ^ Cas - iPTH 1894; variable P control related to inability to pay for binders/sensipar - currently getting samples of velphoro - most recent P 5.8 better the best in a while - 6. Nutrition - renal diet + vitamin 7. Hyperlipidemia - on [place], PA-C [facility] Beeper [phone] 01/02/2015, 10:54 AM   Patient seen and examined, agree with above note with above modifications. Pleasant BM, known to me- noted to be getting under EDW at [facility] HD , now presents with longstanding cough, abnormal CXR with fluid vs infection and abnormal mouth movements.  HD today to get  volume down- I think that is most of issue- will take norvasc down and challenge- not sure about these mouth movements- drug accumulation, hopefully HD will help as well.   Corliss Parish, MD 01/02/2015

## 2015-01-02 NOTE — H&P (Signed)
[facility] History and Physical                                                                                    Tim Kirby, is a 74 y.o. male  MRN: [id number]   DOB - 09-18-1941  Admit Date - 01/02/2015  Outpatient Primary MD for the patient is Tim Croon, MD  With History of -  Past Medical History  Diagnosis Date  . Hyperlipidemia   . Gout   . Secondary hyperparathyroidism   . Anemia   . Colon, diverticulosis   . Thyroid nodule   . Anginal pain     sees Dr. Lia Kirby  . Hypertension     sees Dr. Willy Kirby  . Chronic kidney disease     Tues, thurs, sat  . Aneurysm artery, popliteal       Past Surgical History  Procedure Laterality Date  . Knee surgery      Left cyst removal by Dr. Lorin Kirby  . Av fistula placement      left UA AVF  . Femoral-popliteal bypass graft  10/18/2012    Procedure: BYPASS GRAFT FEMORAL-POPLITEAL ARTERY;  Surgeon: Tim Van Dyne, MD;  Location: [facility] OR;  Service: Vascular;  Laterality: Right;  Right Popliteal Aneurysm Exclusion; Ultrasound guided  . False aneurysm repair Left 12/29/2012    Procedure: REPAIR FALSE ANEURYSM;  Surgeon: Tim [place], MD;  Location: [place];  Service: Vascular;  Laterality: Left;  . Endovascular repair of popliteal artery aneurysm Left 12/29/2012    Procedure: ENDOVASCULAR REPAIR OF POPLITEAL AND FEMORAL ARTERY ANEURYSM;  Surgeon: Tim Stanley, MD;  Location: [place];  Service: Vascular;  Laterality: Left;  . Abdominal aortagram N/A 09/27/2012    Procedure: ABDOMINAL Tim Kirby;  Surgeon: Tim Mould, MD;  Location: [facility] CATH LAB;  Service: Cardiovascular;  Laterality: N/A;    in for   No chief complaint on file.    HPI  Tim Kirby  is a 74 y.o. male, with a PMH of HTN, HLD, popliteal aneurysm, and ESRD.  Who normally dialyses on T/T/S. He works daily at the [employer].  [name] wife helps with the history.  He has had a cough for months with clear sputum.  This has become worse recently.   Coughing has become more frequent and he has had post-tussive vomiting.  His MD gave him cough syrup 4 days ago for a possible viral URI.  This morning he developed chest tightness which woke him from sleep.  It spread across his chest (approx nipple line) but did not radiate.  It resolved after an hour when he reached [facility] ER.    At [place] indicated primarily pulmonary congestion with volume overload.   BNP was greater than [address].  He was [facility] to [facility] for hemo dialysis.  On my exam he requires 3L of oxygen to maintain sats of 92%   Review of Systems   In addition to the HPI above,  ++Subjective fever No Headache, No changes with Vision or hearing, No problems swallowing food or Liquids, +chest tightness. ++ Constipation,   No Blood in stool or Urine, No dysuria, No new  skin rashes or bruises, No new joints pains-aches,  +facial twitch  A full 10 Kirby Review of Systems was done, except as stated above, all other Review of Systems were negative.  Social History History  Substance Use Topics  . Smoking status: Former Smoker    Types: Cigarettes, Pipe    Quit date: 09/18/2007  . Smokeless tobacco: Never Used  . Alcohol Use: No    Family History Family History  Problem Relation Age of Onset  . Hypertension Mother   . Diabetes Mother     Prior to Admission medications   Medication Sig Start Date End Date Taking? Authorizing Provider  amLODipine (NORVASC) 10 MG tablet Take 10 mg by mouth daily.    Historical Provider, MD  aspirin 325 MG tablet Take 325 mg by mouth daily.    Historical Provider, MD  b complex-vitamin c-folic acid (NEPHRO-VITE) 0.8 MG TABS Take 0.8 mg by mouth at bedtime.    Historical Provider, MD  clopidogrel (PLAVIX) 75 MG tablet Take 1 tablet (75 mg total) by mouth daily. 06/02/14   Tim , MD  lisinopril (PRINIVIL,ZESTRIL) 10 MG tablet Take 1 tablet by mouth daily. 01/03/14   Historical Provider, MD  oxyCODONE (OXY IR/ROXICODONE) 5 MG  immediate release tablet Take 1-2 tablets (5-10 mg total) by mouth every 4 (four) hours as needed. 12/30/12   Tim Amor, PA-C  sevelamer (RENVELA) 800 MG tablet Take 3,200 mg by mouth 3 (three) times daily with meals.     Historical Provider, MD  simvastatin (ZOCOR) 40 MG tablet Take 40 mg by mouth every evening.    Historical Provider, MD  sucroferric oxyhydroxide (VELPHORO) 500 MG chewable tablet Chew 500 mg by mouth 3 (three) times daily with meals.    Historical Provider, MD    No Known Allergies  Physical Exam  Vitals  There were no vitals taken for this visit.   General:  lying in bed in NAD, right side of his mouth twitching rapidly.  Pleasant, cooperative.  Psych:  Normal affect and insight, Not Suicidal or Homicidal, Awake Alert, Oriented X 3.  Neuro:   No F.N deficits, ALL C.Nerves Intact, Strength 5/5 all 4 extremities, Sensation intact all 4 extremities.  ENT:  Ears and Eyes appear Normal, Conjunctivae clear, PER. Moist oral mucosa without erythema or exudates.  Neck:  Supple, No lymphadenopathy appreciated  Respiratory:  Decreased breath sounds in bases.  Min. Expiratory wheeze.  Cardiac:  Slightly tachy, No Murmurs, 1+ pitting in LE bilaterally, ++JVD.   Abdomen:  Positive bowel sounds, Soft, Non tender, Non distended,  No masses appreciated  Skin:  No Cyanosis, Normal Skin Turgor, No Skin Rash or Bruise.  Extremities:  Able to move all 4. 5/5 strength in each,  Well healed scars on RLE.  +fistula in arm  Data Review Please see paper chart with labs, xray, ekg obtained at [facility] is morning.  Urinalysis    Component Value Date/Time   COLORURINE YELLOW 12/22/2012 0820   APPEARANCEUR CLEAR 12/22/2012 0820   LABSPEC 1.021 12/22/2012 0820   PHURINE 6.5 12/22/2012 0820   GLUCOSEU NEGATIVE 12/22/2012 0820   HGBUR SMALL* 12/22/2012 0820   BILIRUBINUR NEGATIVE 12/22/2012 0820   KETONESUR NEGATIVE 12/22/2012 0820   PROTEINUR >300* 12/22/2012 0820    UROBILINOGEN 0.2 12/22/2012 0820   NITRITE NEGATIVE 12/22/2012 0820   LEUKOCYTESUR NEGATIVE 12/22/2012 0820    Imaging results:  CXR shows pulmonary congestion with a question of bilateral opacities - will repeat xray  tomorrow after HD is completed.    No results found.  My personal review of EKG: NSR    Assessment & Plan  Principal Problem:   Acute respiratory failure with hypoxia Active Problems:   Chest pain   Essential hypertension   End stage renal disease   PVD (peripheral vascular disease)   Acute bronchitis   Volume overload  Acute hypoxic respiratory failure. Likely secondary to a combination of volume overload and acute bronchitis. Requiring 3L to maintain sats of 92%.  Not normally on oxygen at home. Will give lasix 80 mg IV x 1.   Renal consulted for HD. Oxygen PRN.  Volume overload Patient with evident JVD and mild pitting edema Patient reports compliance with HD.  Last one was Saturday & he stays on for the entire session. Have consulted renal for HD/ Given lasix. Strict Is and Os, 2D echo, daily weights  Acute bronchitis vs CAP. Xray shows vascular congestion ? Opacities. Patient reports post tussive vomiting. Will dialyze and repeat CXR in AM.  Check blood and urine cultures. Rule out flu / Droplet precautions. Empiric Azith and Rocephin (despite his being an HD - no HCAP antibiotics.  I am more suspicious of bronchitis or flu)   Please adjust as appropriate.. Supportive treatment with nebs, mucinex, cough suppressant  Chest tightness. Woke him from sleep this morning and lasted approximately 1 hour until he arrived at [facility] ER.   Likely due to volume overload but will repeat EKG, cycle troponin and check 2D echo.  Mouth Tremor Started acutely this am.  Patient states this happens from time to time when he feels bad.  Suspect electrolyte abnormalities. Checking phosphorus.   Hypertension. Stable.  Will continue home medications.  Amlodipine,  metoprolol  HLD Continue statin  PVD Continue plavix   DVT Prophylaxis: heparin AM Labs Ordered, also please review Full Orders  Family Communication:   Wife and brother at bedside   Code Status:  full Condition:  Guarded.  Time spent in minutes : [address],  PA-C on 01/02/2015 at 10:29 AM  Between 7am to 7pm - Pager - [phone]  After 7pm go to [email or web address] - password [email or web address]  And look for the night coverage person covering me after hours  [facility]

## 2015-01-03 ENCOUNTER — Observation Stay (HOSPITAL_COMMUNITY): Payer: Medicare Other

## 2015-01-03 DIAGNOSIS — E8779 Other fluid overload: Secondary | ICD-10-CM | POA: Diagnosis not present

## 2015-01-03 DIAGNOSIS — I10 Essential (primary) hypertension: Secondary | ICD-10-CM | POA: Diagnosis not present

## 2015-01-03 DIAGNOSIS — N186 End stage renal disease: Secondary | ICD-10-CM | POA: Diagnosis not present

## 2015-01-03 DIAGNOSIS — J40 Bronchitis, not specified as acute or chronic: Secondary | ICD-10-CM | POA: Diagnosis not present

## 2015-01-03 DIAGNOSIS — R079 Chest pain, unspecified: Secondary | ICD-10-CM

## 2015-01-03 DIAGNOSIS — J9601 Acute respiratory failure with hypoxia: Secondary | ICD-10-CM | POA: Diagnosis not present

## 2015-01-03 LAB — URINE CULTURE
Colony Count: NO GROWTH
Culture: NO GROWTH

## 2015-01-03 LAB — BASIC METABOLIC PANEL
Anion gap: 12 (ref 5–15)
BUN: 20 mg/dL (ref 6–23)
CALCIUM: 10 mg/dL (ref 8.4–10.5)
CO2: 29 mmol/L (ref 19–32)
CREATININE: 6.62 mg/dL — AB (ref 0.50–1.35)
Chloride: 98 mmol/L (ref 96–112)
GFR, EST AFRICAN AMERICAN: 9 mL/min — AB (ref >90)
GFR, EST NON AFRICAN AMERICAN: 7 mL/min — AB (ref >90)
Glucose, Bld: 99 mg/dL (ref 70–99)
Potassium: 4.3 mmol/L (ref 3.5–5.1)
SODIUM: 139 mmol/L (ref 135–145)

## 2015-01-03 LAB — CBC
HCT: 32.5 % — ABNORMAL LOW (ref 39.0–52.0)
HEMOGLOBIN: 10.2 g/dL — AB (ref 13.0–17.0)
MCH: 29.5 pg (ref 26.0–34.0)
MCHC: 31.4 g/dL (ref 30.0–36.0)
MCV: 93.9 fL (ref 78.0–100.0)
PLATELETS: 204 10*3/uL (ref 150–400)
RBC: 3.46 MIL/uL — ABNORMAL LOW (ref 4.22–5.81)
RDW: 15.7 % — AB (ref 11.5–15.5)
WBC: 8.6 10*3/uL (ref 4.0–10.5)

## 2015-01-03 MED ORDER — AZITHROMYCIN 500 MG PO TABS
500.0000 mg | ORAL_TABLET | Freq: Every day | ORAL | Status: DC
Start: 2015-01-03 — End: 2015-01-03
  Administered 2015-01-03: 500 mg via ORAL
  Filled 2015-01-03: qty 1

## 2015-01-03 MED ORDER — AZITHROMYCIN 500 MG PO TABS: 500.0000 mg | ORAL_TABLET | Freq: Every day | ORAL | Status: DC

## 2015-01-03 MED ORDER — IPRATROPIUM-ALBUTEROL 0.5-2.5 (3) MG/3ML IN SOLN: 3.0000 mL | Freq: Four times a day (QID) | RESPIRATORY_TRACT | Status: DC | PRN

## 2015-01-03 NOTE — Progress Notes (Signed)
UR completed 

## 2015-01-03 NOTE — Discharge Summary (Signed)
Physician Discharge Summary  ARLYN BUERKLE CHE:[id number] DOB: 1941-03-17 DOA: 01/02/2015  PCP: Sherril Croon, MD  Admit date: 01/02/2015 Discharge date: 01/03/2015  Time spent: 35 minutes  Recommendations for Outpatient Follow-up:    Discharge Diagnoses:  Principal Problem:   Acute respiratory failure with hypoxia Active Problems:   Chest pain   Essential hypertension   End stage renal disease   PVD (peripheral vascular disease)   Acute bronchitis   Volume overload   Discharge Condition: improved  Diet recommendation: renal  Filed Weights   01/02/15 1244 01/02/15 1644  Weight: 87.5 kg (192 lb 14.4 oz) 83.1 kg (183 lb 3.2 oz)    History of present illness:  Tim Kirby is a 74 y.o. male, with a PMH of HTN, HLD, popliteal aneurysm, and ESRD. Who normally dialyses on T/T/S. He works daily at the [employer]. Tim Kirby wife helps with the history. He has had a cough for months with clear sputum. This has become worse recently. Coughing has become more frequent and he has had post-tussive vomiting. His MD gave him cough syrup 4 days ago for a possible viral URI. This morning he developed chest tightness which woke him from sleep. It spread across his chest (approx nipple line) but did not radiate. It resolved after an hour when he reached [facility] ER.   At [place] indicated primarily pulmonary congestion with volume overload. BNP was greater than 32000. He was transferred to [facility] for hemo dialysis. On my exam he requires 3L of oxygen to maintain sats of 92%   Hospital Course:  Acute hypoxic respiratory failure. From volume overload -got HD -dry weight lowered  Volume overload Patient with evident JVD and mild pitting edema Patient reports compliance with HD. Last one was Saturday & he stays on for the entire session. Echo: Study Conclusions  - Left ventricle: The cavity size was normal. Wall thickness was increased in a pattern of  moderate LVH. The estimated ejection fraction was 55%. Wall motion was normal; there were no regional wall motion abnormalities. Features are consistent with a pseudonormal left ventricular filling pattern, with concomitant abnormal relaxation and increased filling pressure (grade 2 diastolic dysfunction). E/medial e&' > 15, suggestive of LV end diastolic pressure at least 20 mmHg. - Aortic valve: There was no stenosis. - Mitral valve: Mildly calcified annulus. There was trivial regurgitation. - Left atrium: The atrium was severely dilated. - Right ventricle: The cavity size was normal. Systolic function was normal. - Right atrium: The atrium was moderately dilated. - Pulmonary arteries: PA peak pressure: 40 mm Hg (S). - Systemic veins: IVC measured 1.7 cm with < 50% respirophasic variation, suggesting RA pressure 8 mmHg.  Impressions:  - Normal LV size with moderate LV hypertrophy. EF 55%. Moderate diastolic dysfunction with evidence for elevated LV filling pressure. Normal RV size and systolic function. Mild pulmonary hypertension. Severe LAE.  Acute bronchitis vs CAP. D/c abx except azithromycin  Chest tightness. Resolved   Hypertension. Stable. Will continue home medications. Amlodipine, metoprolol  HLD  Procedures:  HD  Consultations:  renal  Discharge Exam: Filed Vitals:   01/03/15 1803  BP: 140/80  Pulse: 72  Temp: 98 F (36.7 C)  Resp: 18    General: a+ox3, NAD Cardiovascular: rrr Respiratory: clear  Discharge Instructions   Discharge Instructions    Discharge instructions    Complete by:  As directed   Renal diet     Increase activity slowly    Complete by:  As directed  Discharge Medication List as of 01/04/2015  8:55 AM    START taking these medications   Details  azithromycin (ZITHROMAX) 500 MG tablet Take 1 tablet (500 mg total) by mouth daily., Starting 01/03/2015, Until Discontinued, Print       CONTINUE these medications which have NOT CHANGED   Details  Albuterol Sulfate 108 (90 BASE) MCG/ACT AEPB Inhale 2 puffs into the lungs every 4 (four) hours as needed., Until Discontinued, Historical Med    amLODipine (NORVASC) 10 MG tablet Take 10 mg by mouth daily., Until Discontinued, Historical Med    aspirin 325 MG tablet Take 325 mg by mouth daily., Until Discontinued, Historical Med    clopidogrel (PLAVIX) 75 MG tablet Take 1 tablet (75 mg total) by mouth daily., Starting 06/02/2014, Until Discontinued, Fax    erythromycin ophthalmic ointment Place 1 application into the right eye 2 (two) times daily., Until Discontinued, Historical Med    guaiFENesin-codeine (ROBITUSSIN AC) 100-10 MG/5ML syrup Take 10 mLs by mouth 4 (four) times daily as needed for cough., Until Discontinued, Historical Med    metoprolol tartrate (LOPRESSOR) 25 MG tablet Take 25 mg by mouth 2 (two) times daily., Until Discontinued, Historical Med    multivitamin (RENA-VIT) TABS tablet Take 1 tablet by mouth daily., Until Discontinued, Historical Med    simvastatin (ZOCOR) 40 MG tablet Take 40 mg by mouth every evening., Until Discontinued, Historical Med      STOP taking these medications     oxyCODONE (OXY IR/ROXICODONE) 5 MG immediate release tablet        No Known Allergies    The results of significant diagnostics from this hospitalization (including imaging, microbiology, ancillary and laboratory) are listed below for reference.    Significant Diagnostic Studies: X-ray Chest Pa And Lateral  01/03/2015   CLINICAL DATA:  Followup evaluation for bronchitis. Patient with wheezing and shortness of Breath. Cough.  EXAM: CHEST  2 VIEW  COMPARISON:  01/02/2015  FINDINGS: Airspace and interstitial opacities well as central vascular congestion has improved. There is still mild residual interstitial thickening predominating centrally and at the right lung base.  Cardiac silhouette is mildly enlarged. Aorta is  uncoiled. No mediastinal or hilar masses. No convincing pneumothorax. Minimal pleural effusions.  IMPRESSION: Improved congestive heart failure.   Electronically Signed   By: Lajean Manes M.D.   On: 01/03/2015 08:06    Microbiology: Recent Results (from the past 240 hour(s))  Culture, blood (routine x 2)     Status: None (Preliminary result)   Collection Time: 01/02/15 11:30 AM  Result Value Ref Range Status   Specimen Description BLOOD RIGHT ARM  Final   Special Requests BOTTLES DRAWN AEROBIC ONLY 10CC  Final   Culture   Final           BLOOD CULTURE RECEIVED NO GROWTH TO DATE CULTURE WILL BE HELD FOR 5 DAYS BEFORE ISSUING A FINAL NEGATIVE REPORT Performed at [facility]    Report Status PENDING  Incomplete  Culture, blood (routine x 2)     Status: None (Preliminary result)   Collection Time: 01/02/15 11:40 AM  Result Value Ref Range Status   Specimen Description BLOOD RIGHT HAND  Final   Special Requests BOTTLES DRAWN AEROBIC ONLY 10CC  Final   Culture   Final           BLOOD CULTURE RECEIVED NO GROWTH TO DATE CULTURE WILL BE HELD FOR 5 DAYS BEFORE ISSUING A FINAL NEGATIVE REPORT Performed at [facility]  Report Status PENDING  Incomplete  Urine culture     Status: None   Collection Time: 01/02/15 12:36 PM  Result Value Ref Range Status   Specimen Description URINE, RANDOM  Final   Special Requests Immunocompromised  Final   Colony Count NO GROWTH Performed at [facility]   Final   Culture NO GROWTH Performed at [facility]   Final   Report Status 01/03/2015 FINAL  Final     Labs: Basic Metabolic Panel:  Recent Labs Lab 01/02/15 1130 01/03/15 0435  NA  --  139  K  --  4.3  CL  --  98  CO2  --  29  GLUCOSE  --  99  BUN  --  20  CREATININE 10.12* 6.62*  CALCIUM  --  10.0  MG 2.1  --   PHOS 5.1*  --    Liver Function Tests: No results for input(s): AST, ALT, ALKPHOS, BILITOT, PROT, ALBUMIN in the last 168 hours. No  results for input(s): LIPASE, AMYLASE in the last 168 hours. No results for input(s): AMMONIA in the last 168 hours. CBC:  Recent Labs Lab 01/03/15 0435  WBC 8.6  HGB 10.2*  HCT 32.5*  MCV 93.9  PLT 204   Cardiac Enzymes: No results for input(s): CKTOTAL, CKMB, CKMBINDEX, TROPONINI in the last 168 hours. BNP: BNP (last 3 results) No results for input(s): BNP in the last 8760 hours.  ProBNP (last 3 results) No results for input(s): PROBNP in the last 8760 hours.  CBG: No results for input(s): GLUCAP in the last 168 hours.     SignedEulogio Bear  [facility] 01/04/2015, 4:08 PM

## 2015-01-03 NOTE — Progress Notes (Signed)
PHARMACIST - PHYSICIAN COMMUNICATION DR:   Eliseo Squires CONCERNING: Antibiotic IV to Oral Route Change Policy  RECOMMENDATION: This patient is receiving Azithromycin by the intravenous route.  Based on criteria approved by the Pharmacy and Therapeutics Committee, the antibiotic(s) is/are being converted to the equivalent oral dose form(s).   DESCRIPTION: These criteria include:  Patient being treated for a respiratory tract infection, urinary tract infection, cellulitis or clostridium difficile associated diarrhea if on metronidazole  The patient is not neutropenic and does not exhibit a GI malabsorption state  The patient is eating (either orally or via tube) and/or has been taking other orally administered medications for a least 24 hours  The patient is improving clinically and has a Tmax < 100.5  If you have questions about this conversion, please contact the Pharmacy Department  []   [phone] )  [facility] [x]   [phone] )  [facility]  []   [phone] )  [facility] []   [phone] )  [place], PharmD, BCPS Clinical Pharmacist Pager: [phone] 01/03/2015 10:20 AM

## 2015-01-03 NOTE — Progress Notes (Signed)
  Echocardiogram 2D Echocardiogram has been performed.  Tim Kirby 01/03/2015, 1:55 PM

## 2015-01-03 NOTE — Progress Notes (Signed)
[facility] Progress Note  Assessment/Plan: 1. SOB secondary to pulmonary edema and PNA - R/o cardiology component - work up per primary- received levaquin, ceftriaxone, albuterol at [facility] - changed to [employer] and ceftriaxone here; CXR today  improved CHF  Influenza neg - still with dry cough - will try to decrease volume more tomorrow 2. ESRD - TTS - very compliant with dialysis -Goal 4 L yesterday - with net UF of 3.8 with post weight of 83.1 - 2 kg below current edw - believe we can lower further 3. Hypertension/volume - Had been getting to or below EDW at outpt HD with pre BP of around 160 /100 and post BT 113/89 (Friday) 888 - 280K systolic with weight gains 1-3 L. Runs full treatments; on amlodipine 10 and metoprolol 25 bid; Norvasc lowered to 5 to help with removal of volume - continue to challenge volume next HD -  4. Anemia - Hgb stable in the 10s as an outpt - HGb 10.2 3/30 - lat Mircera 50 on 3/8 q 4 weeks - due to be redosed next week - watch CBC 5. Metabolic bone disease - Poorly controlled. Has been off Hectorol since October due to ^ Cas - iPTH 1894; variable P control related to inability to pay for binders/sensipar - currently getting samples of velphoro - most recent P 5.8 better the best in a while - 6. Nutrition - renal diet + vitamin 7. Hyperlipidemia - on [place], PA-C [facility] Beeper [phone] 01/03/2015,8:18 AM  LOS: 1 day    Patient seen and examined, agree with above note with above modifications. Much better with interventions I think mostly the UF with HD-will need lower EDW at discharge which we can do.   Patient feels well enough to go home today which we would be OK with [place] - but have ordered HD in AM if stays Corliss Parish, MD 01/03/2015     Subjective:   Breathing much better, still dry cough- nearly 4 liters off yest with HD- he tells me that he even feels well enough to go home today    Objective Filed Vitals:   01/02/15 2149 01/02/15 2205 01/02/15 2220 01/03/15 0500  BP:  141/87  141/87  Pulse:  98  85  Temp:  98.6 F (37 C)  98.3 F (36.8 C)  TempSrc:  Oral  Oral  Resp:  20  18  Height:   5\' 9"  (1.753 m)   Weight:      SpO2: 95% 98%  97%   Physical Exam General: getting ready to eat breakfast - sats good on room air Heart: RRR Lungs: still poor expansion but no overt rales Abdomen: soft Extremities: no LE edema Dialysis Access: left lower AVF + bruit  Dialysis Orders: Center: [facility] TT@ 160 EDW 85 2K 2Ca 450/A 1.5 left lower AVF heparin 5000 Mircera 50 mcq q 4 weeks - last given 3/8 no Fe or Hectorol; Recent labs: Hgb 10.2 3/24 - stable 31% sat 3/24 Ca 9.4 corrected P 5.8 iPTH 1894  Additional Objective Labs: Basic Metabolic Panel:  Recent Labs Lab 01/02/15 1130 01/03/15 0435  NA  --  139  K  --  4.3  CL  --  98  CO2  --  29  GLUCOSE  --  99  BUN  --  20  CREATININE 10.12* 6.62*  CALCIUM  --  10.0  PHOS 5.1*  --   CBC:  Recent Labs Lab 01/03/15 0435  WBC 8.6  HGB 10.2*  HCT 32.5*  MCV 93.9  PLT 204   Blood Culture    Component Value Date/Time   SDES BLOOD RIGHT HAND 01/02/2015 1140   SPECREQUEST BOTTLES DRAWN AEROBIC ONLY 10CC 01/02/2015 1140   CULT  01/02/2015 1140           BLOOD CULTURE RECEIVED NO GROWTH TO DATE CULTURE WILL BE HELD FOR 5 DAYS BEFORE ISSUING A FINAL NEGATIVE REPORT Performed at [facility] PENDING 01/02/2015 1140   Studies/Results: X-ray Chest Pa And Lateral  01/03/2015   CLINICAL DATA:  Followup evaluation for bronchitis. Patient with wheezing and shortness of Breath. Cough.  EXAM: CHEST  2 VIEW  COMPARISON:  01/02/2015  FINDINGS: Airspace and interstitial opacities well as central vascular congestion has improved. There is still mild residual interstitial thickening predominating centrally and at the right lung base.  Cardiac silhouette is mildly enlarged. Aorta is uncoiled. No  mediastinal or hilar masses. No convincing pneumothorax. Minimal pleural effusions.  IMPRESSION: Improved congestive heart failure.   Electronically Signed   By: Lajean Manes M.D.   On: 01/03/2015 08:06   Medications:   . amLODipine  5 mg Oral Daily  . atorvastatin  20 mg Oral q1800  . azithromycin  500 mg Intravenous Q24H  . cefTRIAXone (ROCEPHIN)  IV  1 g Intravenous Q24H  . clopidogrel  75 mg Oral Daily  . dextromethorphan  30 mg Oral BID  . docusate sodium  100 mg Oral BID  . guaiFENesin  1,200 mg Oral BID  . heparin  5,000 Units Subcutaneous 3 times per day  . ipratropium-albuterol  3 mL Nebulization TID  . metoprolol tartrate  25 mg Oral BID  . pneumococcal 23 valent vaccine  0.5 mL Intramuscular Tomorrow-1000  . polyethylene glycol  17 g Oral Daily  . sodium chloride  3 mL Intravenous Q12H  . sodium chloride  3 mL Intravenous Q12H  . sucroferric oxyhydroxide  500 mg Oral TID WC

## 2015-01-03 NOTE — Progress Notes (Signed)
PROGRESS NOTE  JAZZ ROGALA WJX:[id number] DOB: 09-Jun-1941 DOA: 01/02/2015 PCP: Sherril Croon, MD  Assessment/Plan: Acute hypoxic respiratory failure. From volume overload -got HD -dry weight lowered  Volume overload Patient with evident JVD and mild pitting edema Patient reports compliance with HD. Last one was Saturday & he stays on for the entire session. Echo pending  Acute bronchitis vs CAP. D/c abx except azithromycin  Chest tightness. Resolved   Hypertension. Stable. Will continue home medications. Amlodipine, metoprolol  HLD Continue statin  Code Status: patient Family Communication:  Disposition Plan: home after echo   Consultants:    Procedures:      HPI/Subjective: Feeling better, anxious to go home  Objective: Filed Vitals:   01/03/15 0857  BP: 167/75  Pulse: 64  Temp: 97.9 F (36.6 C)  Resp: 18    Intake/Output Summary (Last 24 hours) at 01/03/15 1305 Last data filed at 01/03/15 0500  Gross per 24 hour  Intake    783 ml  Output   3884 ml  Net  -3101 ml   Filed Weights   01/02/15 1244 01/02/15 1644  Weight: 87.5 kg (192 lb 14.4 oz) 83.1 kg (183 lb 3.2 oz)    Exam:   General:  A+OX3, NAD  Cardiovascular: rrr  Respiratory: clear  Abdomen: +Bs  Musculoskeletal: min edema  Data Reviewed: Basic Metabolic Panel:  Recent Labs Lab 01/02/15 1130 01/03/15 0435  NA  --  139  K  --  4.3  CL  --  98  CO2  --  29  GLUCOSE  --  99  BUN  --  20  CREATININE 10.12* 6.62*  CALCIUM  --  10.0  MG 2.1  --   PHOS 5.1*  --    Liver Function Tests: No results for input(s): AST, ALT, ALKPHOS, BILITOT, PROT, ALBUMIN in the last 168 hours. No results for input(s): LIPASE, AMYLASE in the last 168 hours. No results for input(s): AMMONIA in the last 168 hours. CBC:  Recent Labs Lab 01/03/15 0435  WBC 8.6  HGB 10.2*  HCT 32.5*  MCV 93.9  PLT 204   Cardiac Enzymes: No results for input(s): CKTOTAL, CKMB, CKMBINDEX,  TROPONINI in the last 168 hours. BNP (last 3 results) No results for input(s): BNP in the last 8760 hours.  ProBNP (last 3 results) No results for input(s): PROBNP in the last 8760 hours.  CBG: No results for input(s): GLUCAP in the last 168 hours.  Recent Results (from the past 240 hour(s))  Culture, blood (routine x 2)     Status: None (Preliminary result)   Collection Time: 01/02/15 11:30 AM  Result Value Ref Range Status   Specimen Description BLOOD RIGHT ARM  Final   Special Requests BOTTLES DRAWN AEROBIC ONLY 10CC  Final   Culture   Final           BLOOD CULTURE RECEIVED NO GROWTH TO DATE CULTURE WILL BE HELD FOR 5 DAYS BEFORE ISSUING A FINAL NEGATIVE REPORT Performed at [facility]    Report Status PENDING  Incomplete  Culture, blood (routine x 2)     Status: None (Preliminary result)   Collection Time: 01/02/15 11:40 AM  Result Value Ref Range Status   Specimen Description BLOOD RIGHT HAND  Final   Special Requests BOTTLES DRAWN AEROBIC ONLY 10CC  Final   Culture   Final           BLOOD CULTURE RECEIVED NO GROWTH TO DATE CULTURE WILL BE HELD FOR  5 DAYS BEFORE ISSUING A FINAL NEGATIVE REPORT Performed at [facility]    Report Status PENDING  Incomplete     Studies: X-ray Chest Pa And Lateral  01/03/2015   CLINICAL DATA:  Followup evaluation for bronchitis. Patient with wheezing and shortness of Breath. Cough.  EXAM: CHEST  2 VIEW  COMPARISON:  01/02/2015  FINDINGS: Airspace and interstitial opacities well as central vascular congestion has improved. There is still mild residual interstitial thickening predominating centrally and at the right lung base.  Cardiac silhouette is mildly enlarged. Aorta is uncoiled. No mediastinal or hilar masses. No convincing pneumothorax. Minimal pleural effusions.  IMPRESSION: Improved congestive heart failure.   Electronically Signed   By: Lajean Manes M.D.   On: 01/03/2015 08:06    Scheduled Meds: . amLODipine  5 mg  Oral Daily  . atorvastatin  20 mg Oral q1800  . azithromycin  500 mg Oral Daily  . cefTRIAXone (ROCEPHIN)  IV  1 g Intravenous Q24H  . clopidogrel  75 mg Oral Daily  . dextromethorphan  30 mg Oral BID  . docusate sodium  100 mg Oral BID  . guaiFENesin  1,200 mg Oral BID  . heparin  5,000 Units Subcutaneous 3 times per day  . metoprolol tartrate  25 mg Oral BID  . pneumococcal 23 valent vaccine  0.5 mL Intramuscular Tomorrow-1000  . polyethylene glycol  17 g Oral Daily  . sodium chloride  3 mL Intravenous Q12H  . sodium chloride  3 mL Intravenous Q12H  . sucroferric oxyhydroxide  500 mg Oral TID WC   Continuous Infusions:  Antibiotics Given (last 72 hours)    Date/Time Action Medication Dose Rate   01/02/15 1759 Given  [hd]   cefTRIAXone (ROCEPHIN) 1 g in dextrose 5 % 50 mL IVPB - Premix 1 g 100 mL/hr   01/02/15 1808 Given  [hd]   azithromycin (ZITHROMAX) 500 mg in dextrose 5 % 250 mL IVPB 500 mg 250 mL/hr   01/03/15 1135 Given   azithromycin (ZITHROMAX) tablet 500 mg 500 mg       Principal Problem:   Acute respiratory failure with hypoxia Active Problems:   Chest pain   Essential hypertension   End stage renal disease   PVD (peripheral vascular disease)   Acute bronchitis   Volume overload    Time spent: 25 min    Amoreena Neubert  [facility] Pager [phone] If 7PM-7AM, please contact night-coverage at [email or web address], password [facility] 01/03/2015, 1:05 PM  LOS: 1 day

## 2015-01-05 DIAGNOSIS — J069 Acute upper respiratory infection, unspecified: Secondary | ICD-10-CM

## 2015-01-05 HISTORY — DX: Acute upper respiratory infection, unspecified: J06.9

## 2015-01-08 LAB — CULTURE, BLOOD (ROUTINE X 2)
CULTURE: NO GROWTH
Culture: NO GROWTH

## 2015-02-01 ENCOUNTER — Encounter: Payer: Self-pay | Admitting: Family

## 2015-02-02 ENCOUNTER — Ambulatory Visit (HOSPITAL_COMMUNITY)
Admission: RE | Admit: 2015-02-02 | Discharge: 2015-02-02 | Disposition: A | Discharge status: Home / Self Care | Payer: Medicare Other | Source: Ambulatory Visit | Attending: Family | Admitting: Family

## 2015-02-02 ENCOUNTER — Ambulatory Visit (INDEPENDENT_AMBULATORY_CARE_PROVIDER_SITE_OTHER)
Admission: RE | Admit: 2015-02-02 | Discharge: 2015-02-02 | Disposition: A | Discharge status: Home / Self Care | Payer: Medicare Other | Source: Ambulatory Visit | Attending: Family | Admitting: Family

## 2015-02-02 ENCOUNTER — Ambulatory Visit (INDEPENDENT_AMBULATORY_CARE_PROVIDER_SITE_OTHER): Payer: Medicare Other | Admitting: Family

## 2015-02-02 ENCOUNTER — Encounter: Payer: Self-pay | Admitting: Family

## 2015-02-02 VITALS — BP 137/82 | HR 66 | Resp 16 | Ht 68.0 in | Wt 179.0 lb

## 2015-02-02 DIAGNOSIS — Z48812 Encounter for surgical aftercare following surgery on the circulatory system: Secondary | ICD-10-CM

## 2015-02-02 DIAGNOSIS — I724 Aneurysm of artery of lower extremity: Secondary | ICD-10-CM

## 2015-02-02 DIAGNOSIS — I739 Peripheral vascular disease, unspecified: Secondary | ICD-10-CM

## 2015-02-02 DIAGNOSIS — I723 Aneurysm of iliac artery: Secondary | ICD-10-CM

## 2015-02-02 DIAGNOSIS — I770 Arteriovenous fistula, acquired: Secondary | ICD-10-CM

## 2015-02-02 DIAGNOSIS — Z9889 Other specified postprocedural states: Secondary | ICD-10-CM

## 2015-02-02 DIAGNOSIS — Z95828 Presence of other vascular implants and grafts: Secondary | ICD-10-CM

## 2015-02-02 NOTE — Patient Instructions (Signed)

## 2015-02-02 NOTE — Progress Notes (Addendum)
[facility] [place] HISTORY AND PHYSICAL -PAD  History of Present Illness Tim Kirby is a 74 y.o. male patient of Dr. Bridgett Larsson who is s/p right femoral to below knee popliteal bypass graft with popliteal aneurysm ligation on 10/18/2012 and left popliteal artery stent for aneurysm on 12/29/2012. 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  Pt denies any history of stroke or TIA..    The patient reports New Medical or Surgical History: hospitalized at [facility] March 2016 for respiratory infection.   His left forearm AVF is 74 years old per wife and working well for his hemodialysis.   Pt Diabetic: No Pt smoker: former smoker, quit in 2008, a pipe, never smoked cigarettes  Pt meds include: Statin :Yes Betablocker: Yes ASA: Yes Other anticoagulants/antiplatelets: Plavix  Past Medical History  Diagnosis Date  . Hyperlipidemia   . Gout   . Secondary hyperparathyroidism   . Anemia   . Colon, diverticulosis   . Thyroid nodule   . Anginal pain     sees Dr. Lia Foyer  . Hypertension     sees Dr. Willy Eddy  . Chronic kidney disease     Tues, thurs, sat  . Aneurysm artery, popliteal     Social History History  Substance Use Topics  . Smoking status: Former Smoker    Types: Cigarettes, Pipe    Quit date: 09/18/2007  . Smokeless tobacco: Never Used  . Alcohol Use: No    Family History Family History  Problem Relation Age of Onset  . Hypertension Mother   . Diabetes Mother   . Diabetes Brother     Past Surgical History  Procedure Laterality Date  . Knee surgery      Left cyst removal by Dr. Lorin Mercy  . Av fistula placement      left UA AVF  . Femoral-popliteal bypass graft  10/18/2012    Procedure: BYPASS GRAFT FEMORAL-POPLITEAL ARTERY;  Surgeon: Conrad Brookfield, MD;   Location: [facility] OR;  Service: Vascular;  Laterality: Right;  Right Popliteal Aneurysm Exclusion; Ultrasound guided  . False aneurysm repair Left 12/29/2012    Procedure: REPAIR FALSE ANEURYSM;  Surgeon: Conrad Radisson, MD;  Location: [place];  Service: Vascular;  Laterality: Left;  . Endovascular repair of popliteal artery aneurysm Left 12/29/2012    Procedure: ENDOVASCULAR REPAIR OF POPLITEAL AND FEMORAL ARTERY ANEURYSM;  Surgeon: Conrad Honcut, MD;  Location: [place];  Service: Vascular;  Laterality: Left;  . Abdominal aortagram N/A 09/27/2012    Procedure: ABDOMINAL Maxcine Ham;  Surgeon: Angelia Mould, MD;  Location: [facility] CATH LAB;  Service: Cardiovascular;  Laterality: N/A;    No Known Allergies  Current Outpatient Prescriptions  Medication Sig Dispense Refill  . amLODipine (NORVASC) 5 MG tablet daily.    Marland Kitchen aspirin 325 MG tablet Take 325 mg by mouth daily.    Marland Kitchen azithromycin (ZITHROMAX) 500 MG tablet Take 1 tablet (500 mg total) by mouth daily. 4 tablet 0  . clopidogrel (PLAVIX) 75 MG tablet Take 1 tablet (75 mg total) by mouth daily. 90 tablet 3  . guaiFENesin-codeine (ROBITUSSIN AC) 100-10 MG/5ML syrup Take 10 mLs by mouth 4 (four) times daily as needed for cough.    . metoprolol tartrate (LOPRESSOR) 25 MG tablet Take 25 mg by  mouth 2 (two) times daily.    . multivitamin (RENA-VIT) TABS tablet Take 1 tablet by mouth daily.    . simvastatin (ZOCOR) 40 MG tablet Take 40 mg by mouth every evening.    . Albuterol Sulfate 108 (90 BASE) MCG/ACT AEPB Inhale 2 puffs into the lungs every 4 (four) hours as needed.    Marland Kitchen amLODipine (NORVASC) 10 MG tablet Take 10 mg by mouth daily.    Marland Kitchen erythromycin ophthalmic ointment Place 1 application into the right eye 2 (two) times daily.     No current facility-administered medications for this visit.    ROS: See HPI for pertinent positives and negatives.   Physical Examination  Filed Vitals:   02/02/15 1053 02/02/15 1102  BP: 145/82 137/82  Pulse: 66 66   Resp: 16   Height: 5\' 8"  (1.727 m)   Weight: 179 lb (81.194 kg)   SpO2: 97%    Body mass index is 27.22 kg/(m^2).  General: A&O x 3, WDWN. Gait: normal Eyes: PERRLA. Pulmonary: CTAB, without wheezes , rales or rhonchi. Cardiac: regular Rythm , without detected murmur.         Carotid Bruits Right Left   Negative Negative  Aorta is not palpable. Radial pulses: right: 2+, Left: 1+                          Left forearm AVF with palpable thrill.  VASCULAR EXAM: Extremities without ischemic changes, without Gangrene; without open wounds.                                                                                                          LE Pulses Right Left       FEMORAL  4+ palpable  2+ palpable        POPLITEAL  not palpable   not palpable       POSTERIOR TIBIAL  not palpable   not palpable        DORSALIS PEDIS      ANTERIOR TIBIAL not palpable  not palpable    Abdomen: soft, NT, no palpable masses. Skin: no rashes, no ulcers. Musculoskeletal: no muscle wasting or atrophy.  Neurologic: A&O X 3; Appropriate Affect ; SENSATION: normal; MOTOR FUNCTION:  moving all extremities equally, motor strength 5/5 throughout. Speech is fluent/normal. CN 2-12 intact.    Non-Invasive Vascular Imaging: DATE: 02/02/2015   ABDOMINAL AORTA DUPLEX EVALUATION    INDICATION: Follow up right common iliac artery aneurysm    PREVIOUS INTERVENTION(S): None    DUPLEX EXAM:     LOCATION DIAMETER AP (cm) DIAMETER TRANSVERSE (cm) VELOCITIES (cm/sec)  Aorta Proximal 2.7 2.6 53  Aorta Mid 2.1 2.3 105  Aorta Distal 2.2 1.9 74  Right Common Iliac Artery 2.7 2.2 58  Left Common Iliac Artery 1.4 13 94    Previous max aortic diameter:  2.7 cm right common iliac aneurysm Date: 07/28/2014     ADDITIONAL FINDINGS: Right common femoral artery 56 cms, left common femoral artery 35 cms, both biphasic waveforms.  Left EIA measures 1.9 cms AP.    IMPRESSION: 2.7 x 2.6 cm right common iliac  artery aneurysm.    Compared to the previous exam:  No change   LOWER EXTREMITY ARTERIAL DUPLEX EVALUATION    INDICATION: PVD    PREVIOUS INTERVENTION(S): Right femoral to below knee popliteal bypass graft with popliteal aneurysm ligation 10/18/2012. Left popliteal artery stent for aneurysm 12/29/2012    DUPLEX EXAM:     RIGHT  LEFT   Peak Systolic Velocity (cm/s) Ratio (if abnormal) Waveform  Peak Systolic Velocity (cm/s) Ratio (if abnormal) Waveform  68  B Inflow Artery See    35  B Proximal Anastomosis Attached    64  B Proximal Graft     66  B Mid Graft     116  T  Distal Graft     137  B Distal Anastomosis     93  M brisk Outflow Artery     .88 Today's ABI / TBI 1.2  .75 Previous ABI / TBI ( 07/28/2014 ) 1.4    Waveform:    M - Monophasic       B - Biphasic       T - Triphasic  If Ankle Brachial Index (ABI) or Toe Brachial Index (TBI) performed, please see complete report     ADDITIONAL FINDINGS: Calcific plaque noted  throughout the left femoral artery    IMPRESSION: 1. Patent right femoral to popliteal bypass graft, calcified native artery prevents thorough visualization. 2. The left stent appears patent however, calcified wall  prevents thorough visualization, proximal portion not well visualized. 3. The right proximal anastomosis  area measures 3.1 x 3.0 cms.    Compared to the previous exam:  Increase of size of right anastomosis area.      ASSESSMENT: MAALIK PINN is a 74 y.o. male who is s/p right femoral to below knee popliteal bypass graft with popliteal aneurysm ligation 10/18/2012 and left popliteal artery stent for aneurysm 12/29/2012. 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 He has no claudication and no tissue loss. Today's aortoiliac Duplex suggests 2.7 x 2.6 cm right common iliac artery aneurysm which is no change from six months prior.  Today's bilateral LE arterial Duplex suggests a patent right femoral to popliteal bypass graft,  calcified native artery prevents thorough visualization. The left stent appears patent however, calcified wall  prevents thorough visualization, proximal portion not well visualized. The right proximal anastomosis  area measures 3.1 x 3.0 cms. Increase of size of right anastomosis area; previous (01/20/14) right anastomosis was 2.6 x 2.7 cm.  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   Communicated with Dr. Bridgett Larsson: Continue surveillance. No intervention on femoral aneurysms necessary until 3.5 cm or thrombus in aneurysm.   Face to face time with patient was 25 minutes. Over 50% of this time was spent on counseling and coordination of care.   PLAN:  Based on the patient's vascular studies and examination, pt will return to clinic in 6 months for BLE ABI, BLE arterial duplex, and aortoiliac duplex in 6 months.  I discussed in depth with the patient the nature of atherosclerosis, and emphasized the importance of maximal medical management including strict control of blood pressure, blood glucose, and lipid levels, obtaining regular exercise, and continued cessation of smoking.  The patient is aware that without maximal medical management the  underlying atherosclerotic disease process will progress, limiting the benefit of any interventions.  The patient was given information about PAD including signs, symptoms, treatment, what symptoms should prompt the patient to seek immediate medical care, and risk reduction measures to take.  Clemon Chambers, RN, MSN, FNP-C [facility] Phone: [phone]  Clinic MD: Bridgett Larsson  02/02/2015 11:36 AM

## 2015-03-10 ENCOUNTER — Encounter (HOSPITAL_COMMUNITY): Payer: Self-pay

## 2015-03-10 DIAGNOSIS — N189 Chronic kidney disease, unspecified: Secondary | ICD-10-CM | POA: Insufficient documentation

## 2015-03-10 DIAGNOSIS — Z792 Long term (current) use of antibiotics: Secondary | ICD-10-CM | POA: Insufficient documentation

## 2015-03-10 DIAGNOSIS — M25552 Pain in left hip: Secondary | ICD-10-CM | POA: Diagnosis not present

## 2015-03-10 DIAGNOSIS — E785 Hyperlipidemia, unspecified: Secondary | ICD-10-CM | POA: Diagnosis not present

## 2015-03-10 DIAGNOSIS — Z7982 Long term (current) use of aspirin: Secondary | ICD-10-CM | POA: Insufficient documentation

## 2015-03-10 DIAGNOSIS — M79605 Pain in left leg: Secondary | ICD-10-CM | POA: Diagnosis present

## 2015-03-10 DIAGNOSIS — Z79899 Other long term (current) drug therapy: Secondary | ICD-10-CM | POA: Insufficient documentation

## 2015-03-10 DIAGNOSIS — I129 Hypertensive chronic kidney disease with stage 1 through stage 4 chronic kidney disease, or unspecified chronic kidney disease: Secondary | ICD-10-CM | POA: Diagnosis not present

## 2015-03-10 DIAGNOSIS — Z862 Personal history of diseases of the blood and blood-forming organs and certain disorders involving the immune mechanism: Secondary | ICD-10-CM | POA: Insufficient documentation

## 2015-03-10 DIAGNOSIS — Z87891 Personal history of nicotine dependence: Secondary | ICD-10-CM | POA: Insufficient documentation

## 2015-03-10 DIAGNOSIS — I209 Angina pectoris, unspecified: Secondary | ICD-10-CM | POA: Insufficient documentation

## 2015-03-10 NOTE — ED Notes (Signed)
Pt reports several days of left leg pain, starts at left hip radiating down left leg.  Pt reports has to lift leg and once he stands is able to walk.  No other s/s noted.  + post tib pulse.

## 2015-03-11 ENCOUNTER — Emergency Department (HOSPITAL_COMMUNITY)
Admission: EM | Admit: 2015-03-11 | Discharge: 2015-03-11 | Disposition: A | Discharge status: Home / Self Care | Payer: Medicare Other | Attending: Emergency Medicine | Admitting: Emergency Medicine

## 2015-03-11 ENCOUNTER — Emergency Department (HOSPITAL_COMMUNITY): Payer: Medicare Other

## 2015-03-11 DIAGNOSIS — N189 Chronic kidney disease, unspecified: Secondary | ICD-10-CM

## 2015-03-11 DIAGNOSIS — R52 Pain, unspecified: Secondary | ICD-10-CM

## 2015-03-11 DIAGNOSIS — M25552 Pain in left hip: Secondary | ICD-10-CM | POA: Diagnosis not present

## 2015-03-11 LAB — CBC WITH DIFFERENTIAL/PLATELET
Basophils Absolute: 0.1 10*3/uL (ref 0.0–0.1)
Basophils Relative: 1 % (ref 0–1)
EOS PCT: 0 % (ref 0–5)
Eosinophils Absolute: 0 10*3/uL (ref 0.0–0.7)
HCT: 35.7 % — ABNORMAL LOW (ref 39.0–52.0)
HEMOGLOBIN: 11.6 g/dL — AB (ref 13.0–17.0)
LYMPHS PCT: 22 % (ref 12–46)
Lymphs Abs: 2 10*3/uL (ref 0.7–4.0)
MCH: 29.7 pg (ref 26.0–34.0)
MCHC: 32.5 g/dL (ref 30.0–36.0)
MCV: 91.5 fL (ref 78.0–100.0)
MONO ABS: 0.8 10*3/uL (ref 0.1–1.0)
Monocytes Relative: 9 % (ref 3–12)
NEUTROS ABS: 5.9 10*3/uL (ref 1.7–7.7)
NEUTROS PCT: 68 % (ref 43–77)
Platelets: 184 10*3/uL (ref 150–400)
RBC: 3.9 MIL/uL — ABNORMAL LOW (ref 4.22–5.81)
RDW: 15.4 % (ref 11.5–15.5)
WBC: 8.7 10*3/uL (ref 4.0–10.5)

## 2015-03-11 LAB — BASIC METABOLIC PANEL
Anion gap: 15 (ref 5–15)
BUN: 20 mg/dL (ref 6–20)
CHLORIDE: 91 mmol/L — AB (ref 101–111)
CO2: 31 mmol/L (ref 22–32)
Calcium: 10.4 mg/dL — ABNORMAL HIGH (ref 8.9–10.3)
Creatinine, Ser: 6.9 mg/dL — ABNORMAL HIGH (ref 0.61–1.24)
GFR, EST AFRICAN AMERICAN: 8 mL/min — AB (ref >60)
GFR, EST NON AFRICAN AMERICAN: 7 mL/min — AB (ref >60)
Glucose, Bld: 95 mg/dL (ref 65–99)
Potassium: 4.3 mmol/L (ref 3.5–5.1)
Sodium: 137 mmol/L (ref 135–145)

## 2015-03-11 MED ORDER — ONDANSETRON HCL 4 MG/2ML IJ SOLN
4.0000 mg | Freq: Once | INTRAMUSCULAR | Status: AC
  Administered 2015-03-11: 4 mg via INTRAVENOUS
  Filled 2015-03-11: qty 2

## 2015-03-11 MED ORDER — OXYCODONE-ACETAMINOPHEN 5-325 MG PO TABS: 1.0000 | ORAL_TABLET | ORAL | Status: DC | PRN

## 2015-03-11 MED ORDER — MORPHINE SULFATE 4 MG/ML IJ SOLN
4.0000 mg | Freq: Once | INTRAMUSCULAR | Status: AC
  Administered 2015-03-11: 4 mg via INTRAVENOUS
  Filled 2015-03-11: qty 1

## 2015-03-11 NOTE — ED Notes (Signed)
Wait time given 

## 2015-03-11 NOTE — ED Provider Notes (Signed)
CSN: [id number]     Arrival date & time 03/10/15  2118 History   None    This chart was scribed for Ripley Fraise, MD by Forrestine Him, ED Scribe. This patient was seen in room D35C/D35C and the patient's care was started 3:11 AM.   Chief Complaint  Patient presents with  . Leg Pain   Patient is a 74 y.o. male presenting with hip pain. The history is provided by the patient. No language interpreter was used.  Hip Pain This is a new problem. The current episode started more than 2 days ago. The problem occurs constantly. The problem has not changed since onset.Pertinent negatives include no chest pain, no abdominal pain, no headaches and no shortness of breath. The symptoms are aggravated by walking and standing. Nothing relieves the symptoms. He has tried nothing for the symptoms.    HPI Comments: Tim Kirby is a 74 y.o. male with a PMHx of hyperlipidemia, chronic kidney disease, and HTN who presents to the Emergency Department complaining of constant, ongoing, unchanged L hip pain that radiates down the lower leg x few days. No recent injury or trauma. He also reports weakness to lower extremities. No OTC medications or home remedies attempted prior to arrival. No fever, chills, vomiting, nausea, chest pain, or shortness of breath. No numbness or loss of sensation. No urinary or bowel incontinence. Tim Kirby is a dialysis pt with last successful treatment today. No known allergies to medications.  Past Medical History  Diagnosis Date  . Hyperlipidemia   . Gout   . Secondary hyperparathyroidism   . Anemia   . Colon, diverticulosis   . Thyroid nodule   . Anginal pain     sees Dr. Lia Foyer  . Hypertension     sees Dr. Willy Eddy  . Chronic kidney disease     Tues, thurs, sat  . Aneurysm artery, popliteal    Past Surgical History  Procedure Laterality Date  . Knee surgery      Left cyst removal by Dr. Lorin Mercy  . Av fistula placement      left UA AVF  . Femoral-popliteal bypass  graft  10/18/2012    Procedure: BYPASS GRAFT FEMORAL-POPLITEAL ARTERY;  Surgeon: Conrad Rio Oso, MD;  Location: [facility] OR;  Service: Vascular;  Laterality: Right;  Right Popliteal Aneurysm Exclusion; Ultrasound guided  . False aneurysm repair Left 12/29/2012    Procedure: REPAIR FALSE ANEURYSM;  Surgeon: Conrad Cutten, MD;  Location: [place];  Service: Vascular;  Laterality: Left;  . Endovascular repair of popliteal artery aneurysm Left 12/29/2012    Procedure: ENDOVASCULAR REPAIR OF POPLITEAL AND FEMORAL ARTERY ANEURYSM;  Surgeon: Conrad , MD;  Location: [place];  Service: Vascular;  Laterality: Left;  . Abdominal aortagram N/A 09/27/2012    Procedure: ABDOMINAL Maxcine Ham;  Surgeon: Angelia Mould, MD;  Location: [facility] CATH LAB;  Service: Cardiovascular;  Laterality: N/A;   Family History  Problem Relation Age of Onset  . Hypertension Mother   . Diabetes Mother   . Diabetes Brother    History  Substance Use Topics  . Smoking status: Former Smoker    Types: Cigarettes, Pipe    Quit date: 09/18/2007  . Smokeless tobacco: Never Used  . Alcohol Use: No    Review of Systems  Constitutional: Negative for fever and chills.  Respiratory: Negative for shortness of breath.   Cardiovascular: Negative for chest pain.  Gastrointestinal: Negative for nausea, vomiting and abdominal pain.  Musculoskeletal: Positive for  arthralgias. Negative for joint swelling.  Skin: Negative for rash.  Neurological: Negative for headaches.  Psychiatric/Behavioral: Negative for confusion.  All other systems reviewed and are negative.     Allergies  Review of patient's allergies indicates no known allergies.  Home Medications   Prior to Admission medications   Medication Sig Start Date End Date Taking? Authorizing Provider  Albuterol Sulfate 108 (90 BASE) MCG/ACT AEPB Inhale 2 puffs into the lungs every 4 (four) hours as needed.    Historical Provider, MD  amLODipine (NORVASC) 10 MG tablet Take 10 mg by  mouth daily.    Historical Provider, MD  amLODipine (NORVASC) 5 MG tablet daily. 02/01/15   Historical Provider, MD  aspirin 325 MG tablet Take 325 mg by mouth daily.    Historical Provider, MD  azithromycin (ZITHROMAX) 500 MG tablet Take 1 tablet (500 mg total) by mouth daily. 01/03/15   Geradine Girt, DO  clopidogrel (PLAVIX) 75 MG tablet Take 1 tablet (75 mg total) by mouth daily. 06/02/14   Conrad Salix, MD  erythromycin ophthalmic ointment Place 1 application into the right eye 2 (two) times daily.    Historical Provider, MD  guaiFENesin-codeine (ROBITUSSIN AC) 100-10 MG/5ML syrup Take 10 mLs by mouth 4 (four) times daily as needed for cough.    Historical Provider, MD  metoprolol tartrate (LOPRESSOR) 25 MG tablet Take 25 mg by mouth 2 (two) times daily.    Historical Provider, MD  multivitamin (RENA-VIT) TABS tablet Take 1 tablet by mouth daily.    Historical Provider, MD  simvastatin (ZOCOR) 40 MG tablet Take 40 mg by mouth every evening.    Historical Provider, MD   Triage Vitals: BP 129/76 mmHg  Pulse 79  Temp(Src) 98.2 F (36.8 C) (Oral)  Resp 16  SpO2 95%  Physical Exam  CONSTITUTIONAL: Well developed/well nourished HEAD: Normocephalic/atraumatic EYES: EOMI/PERRL ENMT: Mucous membranes moist NECK: supple no meningeal signs SPINE/BACK:entire spine nontender CV: S1/S2 noted, no murmurs/rubs/gallops noted LUNGS: Lungs are clear to auscultation bilaterally, no apparent distress ABDOMEN: soft, nontender, no rebound or guarding, bowel sounds noted throughout abdomen GU:no cva tenderness NEURO: Pt is awake/alert/appropriate, moves all extremitiesx4.  No facial droop. Equal strength with knee flexion and ankle dorsiflexion. L hip flexion limited due to pain. No sensory deficit noted in lower extremities EXTREMITIES: pulses normal/equal, full ROM. Mild tenderness with ROM of L hip. No deformity noted. No warmth to L hip. L femoral pulse is intact. L leg is warm to touch SKIN: warm,  color normal PSYCH: no abnormalities of mood noted, alert and oriented to situation   ED Course  Procedures (including critical care time)  DIAGNOSTIC STUDIES: Oxygen Saturation is 95% on RA, adequate by my interpretation.    COORDINATION OF CARE: 3:18 AM- Will give Morphine. Will order DG hip unilateral with pelvis 2-3 views L, DG lumbare spine complete, BMP, and CBC, Discussed treatment plan with pt at bedside and pt agreed to plan.     Pt improved he ambulated without difficulty Xray negative for acute fx I feel he is safe/stable for d/c home All vascular issues seen on xray likely chronic and he has no evidence of acute vascular occlusion at this time    Labs Review Labs Reviewed  BASIC METABOLIC PANEL - Abnormal; Notable for the following:    Chloride 91 (*)    Creatinine, Ser 6.90 (*)    Calcium 10.4 (*)    GFR calc non Af Amer 7 (*)    GFR  calc Af Amer 8 (*)    All other components within normal limits  CBC WITH DIFFERENTIAL/PLATELET - Abnormal; Notable for the following:    RBC 3.90 (*)    Hemoglobin 11.6 (*)    HCT 35.7 (*)    All other components within normal limits    Imaging Review Dg Lumbar Spine Complete  03/11/2015   CLINICAL DATA:  Acute onset of lower back pain.  Initial encounter.  EXAM: LUMBAR SPINE - COMPLETE 4+ VIEW  COMPARISON:  CTA of the abdomen and pelvis performed 10/08/2012  FINDINGS: There is no evidence of fracture or subluxation. Vertebral bodies demonstrate normal height and alignment. Mild disc space narrowing is noted at L5-S1.  The visualized bowel gas pattern is unremarkable in appearance; scattered residual contrast is noted within the colon. The sacroiliac joints are within normal limits. Diffuse vascular calcifications are seen.  IMPRESSION: 1. No evidence of fracture or subluxation along the lumbar spine. 2. Diffuse vascular calcifications seen.   Electronically Signed   By: Garald Balding M.D.   On: 03/11/2015 04:28   Dg Hip Unilat With  Pelvis 2-3 Views Left  03/11/2015   CLINICAL DATA:  Acute onset of left leg pain.  Initial encounter.  EXAM: LEFT HIP (WITH PELVIS) 2-3 VIEWS  COMPARISON:  None.  FINDINGS: There is no evidence of fracture or dislocation. Both femoral heads are seated normally within their respective acetabula. The proximal left femur appears intact. No significant degenerative change is appreciated. The sacroiliac joints are unremarkable in appearance.  The visualized bowel gas pattern is grossly unremarkable in appearance. Scattered residual contrast is seen within the colon. Diffuse vascular calcification is noted, with aneurysmal dilatation of the external iliac arteries and common femoral arteries bilaterally, more prominent on the right. The right common femoral artery appears to measure 4.1 cm in maximal diameter just above its bifurcation. An underlying vascular stent is noted along the proximal left thigh.  IMPRESSION: 1. No evidence of fracture or dislocation. 2. Diffuse vascular calcification, with aneurysmal dilatation of the external iliac arteries and common femoral arteries bilaterally, more prominent on the right. The right common femoral artery appears to measure 4.1 cm in maximal diameter just above its bifurcation.   Electronically Signed   By: Garald Balding M.D.   On: 03/11/2015 04:25   Medications  morphine 4 MG/ML injection 4 mg (4 mg Intravenous Given 03/11/15 0340)  ondansetron (ZOFRAN) injection 4 mg (4 mg Intravenous Given 03/11/15 0514)     MDM   Final diagnoses:  Pain  Pain in left hip  Chronic renal failure, unspecified stage    Nursing notes including past medical history and social history reviewed and considered in documentation Labs/vital reviewed myself and considered during evaluation xrays/imaging reviewed by myself and considered during evaluation   I personally performed the services described in this documentation, which was scribed in my presence. The recorded information has  been reviewed and is accurate.      Ripley Fraise, MD 03/11/15 [phone]

## 2015-03-11 NOTE — Discharge Instructions (Signed)

## 2015-03-30 ENCOUNTER — Other Ambulatory Visit: Payer: Self-pay

## 2015-03-30 ENCOUNTER — Encounter: Payer: Self-pay | Admitting: Family

## 2015-03-30 DIAGNOSIS — Z9889 Other specified postprocedural states: Secondary | ICD-10-CM

## 2015-03-30 DIAGNOSIS — I739 Peripheral vascular disease, unspecified: Secondary | ICD-10-CM

## 2015-03-30 DIAGNOSIS — Z48812 Encounter for surgical aftercare following surgery on the circulatory system: Secondary | ICD-10-CM

## 2015-04-02 ENCOUNTER — Encounter: Payer: Self-pay | Admitting: Family

## 2015-04-02 ENCOUNTER — Ambulatory Visit (INDEPENDENT_AMBULATORY_CARE_PROVIDER_SITE_OTHER): Payer: Medicare Other | Admitting: Family

## 2015-04-02 VITALS — BP 145/86 | HR 73 | Temp 97.6°F | Resp 16 | Ht 68.0 in | Wt 180.0 lb

## 2015-04-02 DIAGNOSIS — I723 Aneurysm of iliac artery: Secondary | ICD-10-CM | POA: Diagnosis not present

## 2015-04-02 DIAGNOSIS — R05 Cough: Secondary | ICD-10-CM | POA: Diagnosis not present

## 2015-04-02 DIAGNOSIS — R0789 Other chest pain: Secondary | ICD-10-CM | POA: Insufficient documentation

## 2015-04-02 DIAGNOSIS — I770 Arteriovenous fistula, acquired: Secondary | ICD-10-CM

## 2015-04-02 DIAGNOSIS — M25552 Pain in left hip: Secondary | ICD-10-CM

## 2015-04-02 DIAGNOSIS — Z95828 Presence of other vascular implants and grafts: Secondary | ICD-10-CM

## 2015-04-02 DIAGNOSIS — R053 Chronic cough: Secondary | ICD-10-CM

## 2015-04-02 DIAGNOSIS — Z9889 Other specified postprocedural states: Secondary | ICD-10-CM | POA: Diagnosis not present

## 2015-04-02 NOTE — Progress Notes (Signed)
[facility] HISTORY AND PHYSICAL -PAD  History of Present Illness Tim Kirby is a 74 y.o. male patient of Dr. Bridgett Kirby who is s/p right femoral to below knee popliteal bypass graft with popliteal aneurysm ligation on 10/18/2012 and left popliteal artery stent for aneurysm on 12/29/2012. 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  He returns today with c/o intermittent left hip pain that started about the end of April 2016, pt denies injury, denies falling. The left hip pain is worse when bad weather approaches, is also worse in the morning for about 30 minutes and improves with walking. He denies non healing wounds in his lower extremities.   He has a cough that bothers him.  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  Pt denies any history of stroke or TIA..   The patient reports New Medical or Surgical History: hospitalized at [facility] March 2016 for respiratory infection.   His left forearm AVF is 12 years old per wife and working well for his hemodialysis.   Pt Diabetic: No Pt smoker: former smoker, quit in 2008, a pipe, never smoked cigarettes  Pt meds include: Statin :Yes Betablocker: Yes ASA: Yes Other anticoagulants/antiplatelets: Plavix    Past Medical History  Diagnosis Date  . Hyperlipidemia   . Gout   . Secondary hyperparathyroidism   . Anemia   . Colon, diverticulosis   . Thyroid nodule   . Anginal pain     sees Dr. Lia Foyer  . Hypertension     sees Dr. Willy Eddy  . Chronic kidney disease     Tues, thurs, sat  . Aneurysm artery, popliteal   . Upper respiratory infection April 2016    Social History History  Substance Use Topics  . Smoking status: Former Smoker    Types: Cigarettes, Pipe    Quit date: 09/18/2007  . Smokeless tobacco: Never Used  . Alcohol Use: No    Family  History Family History  Problem Relation Age of Onset  . Hypertension Mother   . Diabetes Mother   . Diabetes Brother     Past Surgical History  Procedure Laterality Date  . Knee surgery      Left cyst removal by Dr. Lorin Mercy  . Av fistula placement      left UA AVF  . Femoral-popliteal bypass graft  10/18/2012    Procedure: BYPASS GRAFT FEMORAL-POPLITEAL ARTERY;  Surgeon: Conrad [place], MD;  Location: [facility] OR;  Service: Vascular;  Laterality: Right;  Right Popliteal Aneurysm Exclusion; Ultrasound guided  . False aneurysm repair Left 12/29/2012    Procedure: REPAIR FALSE ANEURYSM;  Surgeon: Conrad [place], MD;  Location: [place];  Service: Vascular;  Laterality: Left;  . Endovascular repair of popliteal artery aneurysm Left 12/29/2012    Procedure: ENDOVASCULAR REPAIR OF POPLITEAL AND FEMORAL ARTERY ANEURYSM;  Surgeon: Conrad [place], MD;  Location: [place];  Service: Vascular;  Laterality: Left;  . Abdominal aortagram N/A 09/27/2012    Procedure: ABDOMINAL Maxcine Ham;  Surgeon: Angelia Mould, MD;  Location: [facility] CATH LAB;  Service: Cardiovascular;  Laterality: N/A;    No Known Allergies  Current Outpatient Prescriptions  Medication Sig Dispense Refill  . Albuterol Sulfate 108 (90 BASE) MCG/ACT AEPB Inhale 2 puffs into the lungs every 4 (four) hours as needed.    Marland Kitchen  amLODipine (NORVASC) 10 MG tablet Take 10 mg by mouth daily.    Marland Kitchen amLODipine (NORVASC) 5 MG tablet daily.    Marland Kitchen aspirin 325 MG tablet Take 325 mg by mouth daily.    . clopidogrel (PLAVIX) 75 MG tablet Take 1 tablet (75 mg total) by mouth daily. 90 tablet 3  . erythromycin ophthalmic ointment Place 1 application into the right eye 2 (two) times daily.    Marland Kitchen guaiFENesin-codeine (ROBITUSSIN AC) 100-10 MG/5ML syrup Take 10 mLs by mouth 4 (four) times daily as needed for cough.    . metoprolol tartrate (LOPRESSOR) 25 MG tablet Take 25 mg by mouth 2 (two) times daily.    . multivitamin (RENA-VIT) TABS tablet Take 1 tablet by mouth daily.     Marland Kitchen oxyCODONE-acetaminophen (PERCOCET/ROXICET) 5-325 MG per tablet Take 1 tablet by mouth every 4 (four) hours as needed for severe pain. 10 tablet 0  . simvastatin (ZOCOR) 40 MG tablet Take 40 mg by mouth every evening.     No current facility-administered medications for this visit.    ROS: See HPI for pertinent positives and negatives.   Physical Examination  Filed Vitals:   04/02/15 0957 04/02/15 1006  BP: 156/89 145/86  Pulse: 72 73  Temp: 97.6 F (36.4 C)   TempSrc: Oral   Resp: 16   Height: 5\' 8"  (1.727 m)   Weight: 180 lb (81.647 kg)   SpO2: 98%    Body mass index is 27.38 kg/(m^2).  General: A&O x 3, WDWN. Gait: normal Eyes: PERRLA. Pulmonary: CTAB, without wheezes , rales or rhonchi. Cardiac: regular Rythm , without detected murmur.     Carotid Bruits Right Left   Negative Negative  Aorta is not palpable. Radial pulses: right: 2+, Left: 1+  Left forearm AVF with palpable thrill.  VASCULAR EXAM: Extremities without ischemic changes, without Gangrene; without open wounds.     LE Pulses Right Left   FEMORAL 2+ palpable 2+ palpable    POPLITEAL not palpable  not palpable   POSTERIOR TIBIAL not palpable, audible by Doppler  not palpable, audible by Doppler    DORSALIS PEDIS  ANTERIOR TIBIAL not palpable, audible by Doppler  not palpable, not Dopplerable       PERONEAL Not palpable, audible by Doppler Not palpable, audible by Doppler   Abdomen: soft, NT, no palpable masses. Skin: no rashes, no ulcers. Musculoskeletal: no muscle wasting or atrophy. Neurologic: A&O X 3; Appropriate Affect, MOTOR FUNCTION: moving all extremities equally, motor strength 5/5 throughout, except 3/5 left LE. Speech is fluent/normal. CN 2-12 intact.           ASSESSMENT: Tim Kirby is a 74 y.o. male who is s/p right femoral to below knee popliteal bypass graft with popliteal aneurysm ligation on 10/18/2012 and left popliteal artery stent for aneurysm on 12/29/2012. 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  He returns today with c/o intermittent left hip pain that started about the end of April 2016, pt denies injury, denies falling. The left hip pain is worse when bad weather approaches, is also worse in the morning for about 30 minutes and improves with walking. He denies non healing wounds in his lower extremities.   He has audible pulse signals by Doppler in both feet, bilateral femoral pulses are palpable. His feet appear well perfused with no evidence of ischemia.  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   PLAN:  Follow up with PCP for chronic cough and intermittent left hip pain that is not vascular in origin, has classic symptoms associated with left hip OA.  I discussed in depth with the patient the nature of atherosclerosis, and emphasized the importance of maximal medical management including strict control of blood pressure, blood glucose, and lipid levels, obtaining regular exercise, and continued cessation of smoking.  The patient is aware that without maximal medical management the underlying atherosclerotic disease process will progress, limiting the benefit of any interventions.  Based on the patient's vascular studies and examination, pt will return to clinic on November 4 with BLE ABI, BLE arterial duplex, and aortoiliac duplex as already scheduled.  The patient was given information about PAD including signs, symptoms, treatment, what symptoms should prompt the patient to seek immediate medical care, and risk reduction measures to take.  Clemon Chambers, RN, MSN, FNP-C [facility] [employer] Phone: [phone]  Clinic MD: Trula Slade  04/02/2015 10:27 AM

## 2015-04-02 NOTE — Progress Notes (Signed)
Filed Vitals:   04/02/15 0957 04/02/15 1006  BP: 156/89 145/86  Pulse: 72 73  Temp: 97.6 F (36.4 C)   TempSrc: Oral   Resp: 16   Height: 5\' 8"  (1.727 m)   Weight: 180 lb (81.647 kg)   SpO2: 98%

## 2015-04-02 NOTE — Patient Instructions (Signed)

## 2015-04-23 ENCOUNTER — Emergency Department (HOSPITAL_COMMUNITY): Payer: Medicare Other

## 2015-04-23 ENCOUNTER — Emergency Department (HOSPITAL_COMMUNITY)
Admission: EM | Admit: 2015-04-23 | Discharge: 2015-04-23 | Disposition: A | Discharge status: Home / Self Care | Payer: Medicare Other | Attending: Emergency Medicine | Admitting: Emergency Medicine

## 2015-04-23 ENCOUNTER — Encounter (HOSPITAL_COMMUNITY): Payer: Self-pay | Admitting: Radiology

## 2015-04-23 DIAGNOSIS — I120 Hypertensive chronic kidney disease with stage 5 chronic kidney disease or end stage renal disease: Secondary | ICD-10-CM | POA: Diagnosis not present

## 2015-04-23 DIAGNOSIS — Z7982 Long term (current) use of aspirin: Secondary | ICD-10-CM | POA: Insufficient documentation

## 2015-04-23 DIAGNOSIS — Z992 Dependence on renal dialysis: Secondary | ICD-10-CM | POA: Diagnosis not present

## 2015-04-23 DIAGNOSIS — I499 Cardiac arrhythmia, unspecified: Secondary | ICD-10-CM | POA: Insufficient documentation

## 2015-04-23 DIAGNOSIS — Z79899 Other long term (current) drug therapy: Secondary | ICD-10-CM | POA: Diagnosis not present

## 2015-04-23 DIAGNOSIS — N186 End stage renal disease: Secondary | ICD-10-CM | POA: Diagnosis not present

## 2015-04-23 DIAGNOSIS — R630 Anorexia: Secondary | ICD-10-CM | POA: Diagnosis not present

## 2015-04-23 DIAGNOSIS — Z87891 Personal history of nicotine dependence: Secondary | ICD-10-CM | POA: Insufficient documentation

## 2015-04-23 DIAGNOSIS — Z8739 Personal history of other diseases of the musculoskeletal system and connective tissue: Secondary | ICD-10-CM | POA: Diagnosis not present

## 2015-04-23 DIAGNOSIS — R5383 Other fatigue: Secondary | ICD-10-CM | POA: Insufficient documentation

## 2015-04-23 DIAGNOSIS — R600 Localized edema: Secondary | ICD-10-CM | POA: Diagnosis not present

## 2015-04-23 DIAGNOSIS — R079 Chest pain, unspecified: Secondary | ICD-10-CM | POA: Diagnosis present

## 2015-04-23 DIAGNOSIS — J90 Pleural effusion, not elsewhere classified: Secondary | ICD-10-CM | POA: Insufficient documentation

## 2015-04-23 DIAGNOSIS — I209 Angina pectoris, unspecified: Secondary | ICD-10-CM | POA: Insufficient documentation

## 2015-04-23 DIAGNOSIS — Z862 Personal history of diseases of the blood and blood-forming organs and certain disorders involving the immune mechanism: Secondary | ICD-10-CM | POA: Insufficient documentation

## 2015-04-23 DIAGNOSIS — Z8719 Personal history of other diseases of the digestive system: Secondary | ICD-10-CM | POA: Insufficient documentation

## 2015-04-23 DIAGNOSIS — E785 Hyperlipidemia, unspecified: Secondary | ICD-10-CM | POA: Diagnosis not present

## 2015-04-23 DIAGNOSIS — R0602 Shortness of breath: Secondary | ICD-10-CM

## 2015-04-23 HISTORY — DX: Disorder of kidney and ureter, unspecified: N28.9

## 2015-04-23 LAB — I-STAT TROPONIN, ED: Troponin i, poc: 0.03 ng/mL (ref 0.00–0.08)

## 2015-04-23 LAB — CBC
HCT: 35.8 % — ABNORMAL LOW (ref 39.0–52.0)
Hemoglobin: 11.7 g/dL — ABNORMAL LOW (ref 13.0–17.0)
MCH: 29.2 pg (ref 26.0–34.0)
MCHC: 32.7 g/dL (ref 30.0–36.0)
MCV: 89.3 fL (ref 78.0–100.0)
Platelets: 120 10*3/uL — ABNORMAL LOW (ref 150–400)
RBC: 4.01 MIL/uL — ABNORMAL LOW (ref 4.22–5.81)
RDW: 15.9 % — AB (ref 11.5–15.5)
WBC: 7.2 10*3/uL (ref 4.0–10.5)

## 2015-04-23 LAB — BASIC METABOLIC PANEL
ANION GAP: 16 — AB (ref 5–15)
BUN: 23 mg/dL — ABNORMAL HIGH (ref 6–20)
CALCIUM: 9.8 mg/dL (ref 8.9–10.3)
CHLORIDE: 97 mmol/L — AB (ref 101–111)
CO2: 28 mmol/L (ref 22–32)
Creatinine, Ser: 8.64 mg/dL — ABNORMAL HIGH (ref 0.61–1.24)
GFR calc Af Amer: 6 mL/min — ABNORMAL LOW (ref >60)
GFR calc non Af Amer: 5 mL/min — ABNORMAL LOW (ref >60)
Glucose, Bld: 114 mg/dL — ABNORMAL HIGH (ref 65–99)
Potassium: 3.7 mmol/L (ref 3.5–5.1)
SODIUM: 141 mmol/L (ref 135–145)

## 2015-04-23 MED ORDER — IOHEXOL 350 MG/ML SOLN
80.0000 mL | Freq: Once | INTRAVENOUS | Status: AC | PRN
  Administered 2015-04-23: 80 mL via INTRAVENOUS

## 2015-04-23 NOTE — ED Notes (Signed)
Pt transported to xray 

## 2015-04-23 NOTE — ED Notes (Signed)
Pt 02 saturations were 97%-100% while ambulating. Pulse was 47-56. Pt now in bed, short of breath o2 saturations 95% and heart rate is 96

## 2015-04-23 NOTE — ED Notes (Addendum)
Pt verbalizes understanding of discharge instructions. Pt wheeled to waiting room by wife. A/O x4. VSS

## 2015-04-23 NOTE — ED Notes (Signed)
Pt c/o shortness of breath and chest pressure onset Saturday. Pt had dialysis on Saturday and afterwards went to an urgent care center for symptoms.

## 2015-04-23 NOTE — ED Provider Notes (Signed)
CSN: [id number]     Arrival date & time 04/23/15  0900 History   First MD Initiated Contact with Patient 04/23/15 [phone]     Chief Complaint  Patient presents with  . Shortness of Breath  . Chest Pain     (Consider location/radiation/quality/duration/timing/severity/associated sxs/prior Treatment) HPI Comments: Patient with history of grade 2 diastolic heart failure on no diuretic, irregular heartbeat on Plavix, ESRD dialyzes T/Th/S -- presents with c/o cough and shortness of breath for the past 2-3 days. Cough productive of clear sputum. No URI sx, CP, abdominal pain. No N/V/D. Last BM was this morning. No skin rashes. States that he was below dry weight when he dialyzed 2 days ago and dialysis was slightly shorter than usual. No worsening orthopnea or lower extremity edema. He went to an urgent care after dialysis 2 days ago and was started on azithromycin which he had taken 3 doses of without improvement in symptoms. No h/o asthma. Patient with similar symptoms during admission 3/16.   Patient has been on dialysis for 8 years, still makes some urine.   Patient is a 74 y.o. male presenting with shortness of breath and chest pain. The history is provided by the patient.  Shortness of Breath Associated symptoms: cough   Associated symptoms: no abdominal pain, no chest pain (denies to me, in contrast to triage note.), no fever, no headaches, no rash, no sore throat, no vomiting and no wheezing   Chest Pain Associated symptoms: cough, fatigue and shortness of breath   Associated symptoms: no abdominal pain, no fever, no headache, no nausea and not vomiting     Past Medical History  Diagnosis Date  . Hyperlipidemia   . Gout   . Secondary hyperparathyroidism   . Anemia   . Colon, diverticulosis   . Thyroid nodule   . Anginal pain     sees Dr. Lia Foyer  . Hypertension     sees Dr. Willy Eddy  . Chronic kidney disease     Tues, thurs, sat  . Aneurysm artery, popliteal   . Upper respiratory  infection April 2016   Past Surgical History  Procedure Laterality Date  . Knee surgery      Left cyst removal by Dr. Lorin Mercy  . Av fistula placement      left UA AVF  . Femoral-popliteal bypass graft  10/18/2012    Procedure: BYPASS GRAFT FEMORAL-POPLITEAL ARTERY;  Surgeon: Conrad [place], MD;  Location: [facility] OR;  Service: Vascular;  Laterality: Right;  Right Popliteal Aneurysm Exclusion; Ultrasound guided  . False aneurysm repair Left 12/29/2012    Procedure: REPAIR FALSE ANEURYSM;  Surgeon: Conrad Hopkins, MD;  Location: [place];  Service: Vascular;  Laterality: Left;  . Endovascular repair of popliteal artery aneurysm Left 12/29/2012    Procedure: ENDOVASCULAR REPAIR OF POPLITEAL AND FEMORAL ARTERY ANEURYSM;  Surgeon: Conrad [place], MD;  Location: [place];  Service: Vascular;  Laterality: Left;  . Abdominal aortagram N/A 09/27/2012    Procedure: ABDOMINAL Maxcine Ham;  Surgeon: Angelia Mould, MD;  Location: [facility] CATH LAB;  Service: Cardiovascular;  Laterality: N/A;   Family History  Problem Relation Age of Onset  . Hypertension Mother   . Diabetes Mother   . Diabetes Brother    History  Substance Use Topics  . Smoking status: Former Smoker    Types: Cigarettes, Pipe    Quit date: 09/18/2007  . Smokeless tobacco: Never Used  . Alcohol Use: No    Review of Systems  Constitutional: Positive for appetite change (decreased) and fatigue. Negative for fever.  HENT: Negative for rhinorrhea and sore throat.   Eyes: Negative for redness.  Respiratory: Positive for cough and shortness of breath. Negative for wheezing.   Cardiovascular: Negative for chest pain (denies to me, in contrast to triage note.) and leg swelling.  Gastrointestinal: Negative for nausea, vomiting, abdominal pain and diarrhea.  Genitourinary: Negative for dysuria.  Musculoskeletal: Negative for myalgias.  Skin: Negative for rash.  Neurological: Negative for headaches.      Allergies  Review of patient's allergies  indicates no known allergies.  Home Medications   Prior to Admission medications   Medication Sig Start Date End Date Taking? Authorizing Provider  Albuterol Sulfate 108 (90 BASE) MCG/ACT AEPB Inhale 2 puffs into the lungs every 4 (four) hours as needed.    Historical Provider, MD  amLODipine (NORVASC) 10 MG tablet Take 10 mg by mouth daily.    Historical Provider, MD  amLODipine (NORVASC) 5 MG tablet daily. 02/01/15   Historical Provider, MD  aspirin 325 MG tablet Take 325 mg by mouth daily.    Historical Provider, MD  clopidogrel (PLAVIX) 75 MG tablet Take 1 tablet (75 mg total) by mouth daily. 06/02/14   Conrad [place], MD  erythromycin ophthalmic ointment Place 1 application into the right eye 2 (two) times daily.    Historical Provider, MD  guaiFENesin-codeine (ROBITUSSIN AC) 100-10 MG/5ML syrup Take 10 mLs by mouth 4 (four) times daily as needed for cough.    Historical Provider, MD  metoprolol tartrate (LOPRESSOR) 25 MG tablet Take 25 mg by mouth 2 (two) times daily.    Historical Provider, MD  multivitamin (RENA-VIT) TABS tablet Take 1 tablet by mouth daily.    Historical Provider, MD  oxyCODONE-acetaminophen (PERCOCET/ROXICET) 5-325 MG per tablet Take 1 tablet by mouth every 4 (four) hours as needed for severe pain. 03/11/15   Ripley Fraise, MD  simvastatin (ZOCOR) 40 MG tablet Take 40 mg by mouth every evening.    Historical Provider, MD   BP 146/91 mmHg  Pulse 108  Temp(Src) 98.2 F (36.8 C) (Oral)  Resp 22  Ht 5\' 8"  (1.727 m)  Wt 183 lb (83.008 kg)  BMI 27.83 kg/m2  SpO2 96% Physical Exam  Constitutional: He appears well-developed and well-nourished.  HENT:  Head: Normocephalic and atraumatic.  Mouth/Throat: Oropharynx is clear and moist.  Eyes: Conjunctivae are normal. Right eye exhibits no discharge. Left eye exhibits no discharge.  Neck: Normal range of motion. Neck supple. No JVD present.  Cardiovascular: Normal rate and normal heart sounds.  An irregularly irregular  rhythm present.  No murmur heard. Pulmonary/Chest: Effort normal. No accessory muscle usage. Tachypnea (mild) noted. No respiratory distress. He has decreased breath sounds in the right lower field and the left lower field. He has no wheezes. He has no rhonchi. He has no rales.  Abdominal: Soft. There is no tenderness. There is no rebound and no guarding.  Musculoskeletal: He exhibits tenderness (trace bilateral LE edema. ). He exhibits no edema.  Neurological: He is alert.  Skin: Skin is warm and dry.  Psychiatric: He has a normal mood and affect.  Nursing note and vitals reviewed.   ED Course  Procedures (including critical care time) Labs Review Labs Reviewed  BASIC METABOLIC PANEL - Abnormal; Notable for the following:    Chloride 97 (*)    Glucose, Bld 114 (*)    BUN 23 (*)    Creatinine, Ser 8.64 (*)  GFR calc non Af Amer 5 (*)    GFR calc Af Amer 6 (*)    Anion gap 16 (*)    All other components within normal limits  CBC - Abnormal; Notable for the following:    RBC 4.01 (*)    Hemoglobin 11.7 (*)    HCT 35.8 (*)    RDW 15.9 (*)    Platelets 120 (*)    All other components within normal limits  I-STAT TROPOININ, ED    Imaging Review Dg Chest 2 View  04/23/2015   CLINICAL DATA:  Shortness of breath and cough. Chronic renal disease  EXAM: CHEST  2 VIEW  COMPARISON:  January 03, 2015  FINDINGS: Generalized interstitial edema remains. There is cardiomegaly. There is a minimal left pleural effusion. There is no appreciable airspace consolidation. Pulmonary vascularity is within normal limits. There is atherosclerotic change in aorta. No adenopathy. No bone lesions.  IMPRESSION: Findings most likely represent a degree of chronic congestive heart failure. Stable interstitial edema and cardiomegaly. No frank airspace consolidation.   Electronically Signed   By: Lowella Grip III M.D.   On: 04/23/2015 09:41   Ct Angio Chest Pe W/cm &/or Wo Cm  04/23/2015   CLINICAL DATA:   Shortness of breath and chest pressure  EXAM: CT ANGIOGRAPHY CHEST WITH CONTRAST  TECHNIQUE: Multidetector CT imaging of the chest was performed using the standard protocol during bolus administration of intravenous contrast. Multiplanar CT image reconstructions and MIPs were obtained to evaluate the vascular anatomy.  CONTRAST:  9mL OMNIPAQUE IOHEXOL 350 MG/ML SOLN  COMPARISON:  Chest CT November 19, 2011; chest radiograph April 23, 2015  FINDINGS: There is no demonstrable pulmonary embolus. There is atherosclerotic change in the aorta. There is prominence of the ascending thoracic aorta with a maximum transverse diameter of 4.3 x 4.2 cm. There is no thoracic aortic dissection. There are scattered atherosclerotic plaques throughout the visualized great vessels. There is extensive coronary artery calcification.  There is left ventricular hypertrophy. Heart is slightly enlarged. The pericardium is minimally thickened.  There are moderate pleural effusions bilaterally with patchy bibasilar consolidation. There is mild interstitial edema in the bases.  The thyroid appears unremarkable. There is no appreciable thoracic adenopathy. There are subcentimeter mediastinal lymph nodes which do not meet size criteria for pathologic significance.  In the visualized upper abdomen, there is extensive atherosclerotic calcification.  There are multiple sclerotic lesions throughout the bones.  Review of the MIP images confirms the above findings.  IMPRESSION: No demonstrable pulmonary embolus.  Multiple foci of atherosclerotic calcification. Extensive coronary artery calcification.  Prominence of the at ascending thoracic aorta. Recommend annual imaging followup by CTA or MRA. This recommendation follows 2010 ACCF/AHA/AATS/ACR/ASA/SCA/SCAI/SIR/STS/SVM Guidelines for the Diagnosis and Management of Patients with Thoracic Aortic Disease. Circulation. 2010; 121: J570-V779.  Evidence of a degree of congestive heart failure. Cannot  exclude superimposed pneumonia in the bases.  No demonstrable adenopathy.  Sclerotic bone lesions throughout the thorax. These sclerotic lesions could be due to secondary hyperparathyroidism from chronic renal failure. Changes of this nature also could be indicative of metastatic prostate carcinoma. Appropriate clinical and laboratory evaluation of the prostate is strongly advised given this appearance of the bony structures.   Electronically Signed   By: Lowella Grip III M.D.   On: 04/23/2015 12:51     EKG Interpretation   Date/Time:  Monday April 23 2015 09:09:01 EDT Ventricular Rate:  93 PR Interval:    QRS Duration: 86 QT Interval:  386 QTC Calculation: 479 R Axis:   51 Text Interpretation:  Atrial flutter with variable A-V block with  premature ventricular or aberrantly conducted complexes Septal infarct ,  age undetermined Abnormal ECG Since last tracing Atrial flutter is new  Confirmed by Eulis Foster  MD, ELLIOTT ([id number]) on 04/23/2015 10:32:50 AM      9:29 AM Patient seen and examined. Work-up initiated.   Vital signs reviewed and are as follows: BP 146/91 mmHg  Pulse 108  Temp(Src) 98.2 F (36.8 C) (Oral)  Resp 22  Ht 5\' 8"  (1.727 m)  Wt 183 lb (83.008 kg)  BMI 27.83 kg/m2  SpO2 96%  10:24 AM Work-up is baseline for this ESRD patient. Patient ambulated and maintained pulse ox but became very short of breath. Will discuss with Dr. Eulis Foster.   11:41 AM Pt seen with Dr. Eulis Foster. Spoke with Dr. Joelyn Oms who is familiar with patient. Cleared CT angio with him to screen for PE.    2:23 PM Patient discussed with Dr. Joelyn Oms who was kind enough to review patient's records. He feels that attempt can be made at dialysis to adjust dry weight to help improve edema/effusion -- if patient is comfortable with this. We know that he does not become hypoxic with ambulation. We know that he does not have PE. I had a long discussion with patient and wife at bedside. They are comfortable with  proceeding with outpatient treatment at dialysis.  I encouraged him to return the emergency department if patient has worsening shortness of breath or difficulty breathing, fever, chest pain, or any new concerns. Patient and wife verbalized understanding and agree with plan.     MDM   Final diagnoses:  Shortness of breath  Pleural effusion  End stage renal disease on dialysis   Shortness of breath/pleural effusion: CT angiogram performed and does not demonstrate PE. Moderate effusions noted. Patient clinically does not have pneumonia. Nephrologist involved in decision-making regarding care and disposition. Patient does not report chest pain to me, only cough and shortness of breath. Patient was ambulated in the department and maintained good oxygen saturation. Treatment as above. Will discontinue azithromycin at this point, given no suspected pneumonia. Patient may continue Tessalon.   Atrial fibrillation: It is unclear if this is new, patient is on Plavix.   Carlisle Cater, PA-C 04/23/15 [place], MD 04/23/15 [phone]

## 2015-04-23 NOTE — ED Provider Notes (Signed)
  Face-to-face evaluation   History: He presents for evaluation of shortness of breath both at rest and with ambulation. His doctor recently raised his dry weight, because he was feeling poorly at 82 kg. Last dialysis was 2 days ago.  Physical exam: Alert, calm, cooperative. Lungs with decreased air movement left base, no wheezing or rales. Mild nonproductive cough is present.  Medical screening examination/treatment/procedure(s) were conducted as a shared visit with non-physician practitioner(s) and myself.  I personally evaluated the patient during the encounter   Daleen Bo, MD 04/23/15 [phone]

## 2015-04-23 NOTE — Discharge Instructions (Signed)
Please read and follow all provided instructions.  Your diagnoses today include:  1. Shortness of breath   2. Pleural effusion   3. End stage renal disease on dialysis     Tests performed today include:  CT scan of your chest - shows fluid in lungs, no blood clot  Blood count and electrolytes - as expected for kidney failure  EKG - irregular heart beat, no changes from electrolytes  Vital signs. See below for your results today.   Medications prescribed:   None  Take any prescribed medications only as directed.  Home care instructions:  Follow any educational materials contained in this packet.  As your doctor tomorrow if you should continue to take antibiotic prescribed this weekend.    Follow-up instructions: Please follow-up with your doctor at dialysis tomorrow. Dr. Joelyn Oms is going to recommend adjustments to your dialysis.   Return instructions:   Please return to the Emergency Department if you experience worsening symptoms.   Return with chest pain, worsening shortness of breath or trouble breathing, fever  Please return if you have any other emergent concerns.  Additional Information:  Your vital signs today were: BP 155/85 mmHg   Pulse 86   Temp(Src) 98.2 F (36.8 C) (Oral)   Resp 24   Ht 5\' 8"  (1.727 m)   Wt 176 lb 3.2 oz (79.924 kg)   BMI 26.80 kg/m2   SpO2 100% If your blood pressure (BP) was elevated above 135/85 this visit, please have this repeated by your doctor within one month. --------------

## 2015-04-26 ENCOUNTER — Emergency Department (HOSPITAL_COMMUNITY): Payer: Medicare Other

## 2015-04-26 ENCOUNTER — Encounter (HOSPITAL_COMMUNITY): Payer: Self-pay | Admitting: Vascular Surgery

## 2015-04-26 ENCOUNTER — Inpatient Hospital Stay (HOSPITAL_COMMUNITY)
Admission: EM | Admit: 2015-04-26 | Discharge: 2015-04-29 | DRG: 291 | Disposition: A | Discharge status: Home / Self Care | Payer: Medicare Other | Attending: Internal Medicine | Admitting: Internal Medicine

## 2015-04-26 DIAGNOSIS — R000 Tachycardia, unspecified: Secondary | ICD-10-CM | POA: Diagnosis not present

## 2015-04-26 DIAGNOSIS — Z79899 Other long term (current) drug therapy: Secondary | ICD-10-CM

## 2015-04-26 DIAGNOSIS — I10 Essential (primary) hypertension: Secondary | ICD-10-CM | POA: Diagnosis present

## 2015-04-26 DIAGNOSIS — Z87891 Personal history of nicotine dependence: Secondary | ICD-10-CM

## 2015-04-26 DIAGNOSIS — E041 Nontoxic single thyroid nodule: Secondary | ICD-10-CM | POA: Diagnosis present

## 2015-04-26 DIAGNOSIS — E785 Hyperlipidemia, unspecified: Secondary | ICD-10-CM | POA: Diagnosis present

## 2015-04-26 DIAGNOSIS — E8770 Fluid overload, unspecified: Secondary | ICD-10-CM | POA: Diagnosis present

## 2015-04-26 DIAGNOSIS — N186 End stage renal disease: Secondary | ICD-10-CM | POA: Diagnosis present

## 2015-04-26 DIAGNOSIS — Z8249 Family history of ischemic heart disease and other diseases of the circulatory system: Secondary | ICD-10-CM

## 2015-04-26 DIAGNOSIS — D696 Thrombocytopenia, unspecified: Secondary | ICD-10-CM | POA: Diagnosis not present

## 2015-04-26 DIAGNOSIS — D631 Anemia in chronic kidney disease: Secondary | ICD-10-CM | POA: Diagnosis present

## 2015-04-26 DIAGNOSIS — M109 Gout, unspecified: Secondary | ICD-10-CM | POA: Diagnosis present

## 2015-04-26 DIAGNOSIS — I5033 Acute on chronic diastolic (congestive) heart failure: Principal | ICD-10-CM | POA: Diagnosis present

## 2015-04-26 DIAGNOSIS — R0602 Shortness of breath: Secondary | ICD-10-CM

## 2015-04-26 DIAGNOSIS — Z7902 Long term (current) use of antithrombotics/antiplatelets: Secondary | ICD-10-CM

## 2015-04-26 DIAGNOSIS — R0902 Hypoxemia: Secondary | ICD-10-CM

## 2015-04-26 DIAGNOSIS — Z992 Dependence on renal dialysis: Secondary | ICD-10-CM

## 2015-04-26 DIAGNOSIS — Z95820 Peripheral vascular angioplasty status with implants and grafts: Secondary | ICD-10-CM

## 2015-04-26 DIAGNOSIS — I739 Peripheral vascular disease, unspecified: Secondary | ICD-10-CM | POA: Diagnosis present

## 2015-04-26 DIAGNOSIS — K5790 Diverticulosis of intestine, part unspecified, without perforation or abscess without bleeding: Secondary | ICD-10-CM | POA: Diagnosis present

## 2015-04-26 DIAGNOSIS — R918 Other nonspecific abnormal finding of lung field: Secondary | ICD-10-CM | POA: Diagnosis present

## 2015-04-26 DIAGNOSIS — I509 Heart failure, unspecified: Secondary | ICD-10-CM

## 2015-04-26 DIAGNOSIS — R0603 Acute respiratory distress: Secondary | ICD-10-CM

## 2015-04-26 DIAGNOSIS — I120 Hypertensive chronic kidney disease with stage 5 chronic kidney disease or end stage renal disease: Secondary | ICD-10-CM | POA: Diagnosis present

## 2015-04-26 DIAGNOSIS — J9601 Acute respiratory failure with hypoxia: Secondary | ICD-10-CM | POA: Diagnosis present

## 2015-04-26 LAB — BASIC METABOLIC PANEL
Anion gap: 15 (ref 5–15)
BUN: 13 mg/dL (ref 6–20)
CALCIUM: 10 mg/dL (ref 8.9–10.3)
CO2: 32 mmol/L (ref 22–32)
Chloride: 94 mmol/L — ABNORMAL LOW (ref 101–111)
Creatinine, Ser: 6.39 mg/dL — ABNORMAL HIGH (ref 0.61–1.24)
GFR calc Af Amer: 9 mL/min — ABNORMAL LOW (ref >60)
GFR, EST NON AFRICAN AMERICAN: 8 mL/min — AB (ref >60)
Glucose, Bld: 117 mg/dL — ABNORMAL HIGH (ref 65–99)
POTASSIUM: 3.4 mmol/L — AB (ref 3.5–5.1)
Sodium: 141 mmol/L (ref 135–145)

## 2015-04-26 LAB — BRAIN NATRIURETIC PEPTIDE: B Natriuretic Peptide: 1518.4 pg/mL — ABNORMAL HIGH (ref 0.0–100.0)

## 2015-04-26 LAB — CBC
HEMATOCRIT: 35.3 % — AB (ref 39.0–52.0)
Hemoglobin: 11 g/dL — ABNORMAL LOW (ref 13.0–17.0)
MCH: 28.1 pg (ref 26.0–34.0)
MCHC: 31.2 g/dL (ref 30.0–36.0)
MCV: 90.3 fL (ref 78.0–100.0)
PLATELETS: 115 10*3/uL — AB (ref 150–400)
RBC: 3.91 MIL/uL — ABNORMAL LOW (ref 4.22–5.81)
RDW: 16.4 % — AB (ref 11.5–15.5)
WBC: 6.8 10*3/uL (ref 4.0–10.5)

## 2015-04-26 LAB — I-STAT TROPONIN, ED: TROPONIN I, POC: 0.04 ng/mL (ref 0.00–0.08)

## 2015-04-26 MED ORDER — ACETAMINOPHEN 500 MG PO TABS
1000.0000 mg | ORAL_TABLET | Freq: Once | ORAL | Status: AC
  Administered 2015-04-26: 1000 mg via ORAL
  Filled 2015-04-26: qty 2

## 2015-04-26 NOTE — ED Provider Notes (Signed)
CSN: [id number]     Arrival date & time 04/26/15  2023 History   First MD Initiated Contact with Patient 04/26/15 2142     Chief Complaint  Patient presents with  . Shortness of Breath  . Weakness     (Consider location/radiation/quality/duration/timing/severity/associated sxs/prior Treatment) Patient is a 74 y.o. male presenting with shortness of breath.  Shortness of Breath Severity:  Moderate Onset quality:  Gradual Duration:  1 week Timing:  Constant Progression:  Unchanged Chronicity:  New Context: activity   Context: not strong odors   Relieved by:  None tried Worsened by:  Nothing tried Ineffective treatments:  None tried Associated symptoms: cough and PND   Associated symptoms: no abdominal pain, no chest pain, no ear pain, no fever, no syncope and no wheezing     Past Medical History  Diagnosis Date  . Hyperlipidemia   . Gout   . Secondary hyperparathyroidism   . Anemia   . Colon, diverticulosis   . Thyroid nodule   . Anginal pain     sees Dr. Lia Foyer  . Hypertension     sees Dr. Willy Eddy  . Chronic kidney disease     Tues, thurs, sat  . Aneurysm artery, popliteal   . Upper respiratory infection April 2016  . Renal insufficiency    Past Surgical History  Procedure Laterality Date  . Knee surgery      Left cyst removal by Dr. Lorin Mercy  . Av fistula placement      left UA AVF  . Femoral-popliteal bypass graft  10/18/2012    Procedure: BYPASS GRAFT FEMORAL-POPLITEAL ARTERY;  Surgeon: Conrad Uriah, MD;  Location: [facility] OR;  Service: Vascular;  Laterality: Right;  Right Popliteal Aneurysm Exclusion; Ultrasound guided  . False aneurysm repair Left 12/29/2012    Procedure: REPAIR FALSE ANEURYSM;  Surgeon: Conrad Spickard, MD;  Location: [place];  Service: Vascular;  Laterality: Left;  . Endovascular repair of popliteal artery aneurysm Left 12/29/2012    Procedure: ENDOVASCULAR REPAIR OF POPLITEAL AND FEMORAL ARTERY ANEURYSM;  Surgeon: Conrad [place], MD;  Location: [place];   Service: Vascular;  Laterality: Left;  . Abdominal aortagram N/A 09/27/2012    Procedure: ABDOMINAL Maxcine Ham;  Surgeon: Angelia Mould, MD;  Location: [facility] CATH LAB;  Service: Cardiovascular;  Laterality: N/A;   Family History  Problem Relation Age of Onset  . Hypertension Mother   . Diabetes Mother   . Diabetes Brother    History  Substance Use Topics  . Smoking status: Former Smoker    Types: Cigarettes, Pipe    Quit date: 09/18/2007  . Smokeless tobacco: Never Used  . Alcohol Use: No    Review of Systems  Constitutional: Negative for fever and chills.  HENT: Negative for ear pain and rhinorrhea.   Respiratory: Positive for cough and shortness of breath. Negative for wheezing.   Cardiovascular: Positive for PND. Negative for chest pain, palpitations, leg swelling and syncope.  Gastrointestinal: Negative for abdominal pain.  Skin: Negative for pallor and wound.  All other systems reviewed and are negative.     Allergies  Review of patient's allergies indicates no known allergies.  Home Medications   Prior to Admission medications   Medication Sig Start Date End Date Taking? Authorizing Provider  Albuterol Sulfate 108 (90 BASE) MCG/ACT AEPB Inhale 2 puffs into the lungs every 4 (four) hours as needed.   Yes Historical Provider, MD  amLODipine (NORVASC) 5 MG tablet Take 5 mg by mouth daily.  02/01/15  Yes Historical Provider, MD  aspirin 325 MG tablet Take 325 mg by mouth daily.   Yes Historical Provider, MD  benzonatate (TESSALON) 200 MG capsule Take 200 mg by mouth every 8 (eight) hours. 04/21/15  Yes Historical Provider, MD  clopidogrel (PLAVIX) 75 MG tablet Take 1 tablet (75 mg total) by mouth daily. 06/02/14  Yes Conrad McLain, MD  lanthanum (FOSRENOL) 1000 MG chewable tablet Chew 1,000 mg by mouth 2 (two) times daily with a meal.   Yes Historical Provider, MD  metoprolol tartrate (LOPRESSOR) 25 MG tablet Take 25 mg by mouth 2 (two) times daily.   Yes Historical  Provider, MD  multivitamin (RENA-VIT) TABS tablet Take 1 tablet by mouth daily.   Yes Historical Provider, MD  omeprazole (PRILOSEC) 40 MG capsule Take 40 mg by mouth daily.   Yes Historical Provider, MD  simvastatin (ZOCOR) 40 MG tablet Take 40 mg by mouth every evening.   Yes Historical Provider, MD   BP 148/83 mmHg  Pulse 77  Temp(Src) 98.5 F (36.9 C) (Oral)  Resp 20  SpO2 95% Physical Exam  Constitutional: He is oriented to person, place, and time. He appears well-developed and well-nourished.  HENT:  Head: Normocephalic and atraumatic.  Eyes: Conjunctivae and EOM are normal.  Neck: Normal range of motion. Neck supple.  Cardiovascular: Normal rate and regular rhythm.   Pulmonary/Chest: Effort normal. Tachypnea noted. No respiratory distress. He has no wheezes. He has rales.  Hypoxic when speaking, can't speak in full sentences.  79% when ambulating.   Abdominal: Soft. There is no tenderness.  Musculoskeletal: Normal range of motion. He exhibits no edema or tenderness.  Neurological: He is alert and oriented to person, place, and time.  Skin: Skin is warm and dry.  Nursing note and vitals reviewed.   ED Course  Procedures (including critical care time)  CRITICAL CARE Performed by: Merrily Pew   Total critical care time: 30 minutes  Critical care time was exclusive of separately billable procedures and treating other patients.  Critical care was necessary to treat or prevent imminent or life-threatening deterioration.  Critical care was time spent personally by me on the following activities: development of treatment plan with patient and/or surrogate as well as nursing, discussions with consultants, evaluation of patient's response to treatment, examination of patient, obtaining history from patient or surrogate, ordering and performing treatments and interventions, ordering and review of laboratory studies, ordering and review of radiographic studies, pulse oximetry  and re-evaluation of patient's condition.   Labs Review Labs Reviewed  BASIC METABOLIC PANEL - Abnormal; Notable for the following:    Potassium 3.4 (*)    Chloride 94 (*)    Glucose, Bld 117 (*)    Creatinine, Ser 6.39 (*)    GFR calc non Af Amer 8 (*)    GFR calc Af Amer 9 (*)    All other components within normal limits  CBC - Abnormal; Notable for the following:    RBC 3.91 (*)    Hemoglobin 11.0 (*)    HCT 35.3 (*)    RDW 16.4 (*)    Platelets 115 (*)    All other components within normal limits  BRAIN NATRIURETIC PEPTIDE - Abnormal; Notable for the following:    B Natriuretic Peptide 1518.4 (*)    All other components within normal limits  COMPREHENSIVE METABOLIC PANEL - Abnormal; Notable for the following:    Chloride 96 (*)    CO2 33 (*)  Glucose, Bld 102 (*)    Creatinine, Ser 6.85 (*)    Total Protein 6.0 (*)    Albumin 3.1 (*)    ALT 14 (*)    Alkaline Phosphatase 1478 (*)    GFR calc non Af Amer 7 (*)    GFR calc Af Amer 8 (*)    All other components within normal limits  CBC - Abnormal; Notable for the following:    RBC 3.47 (*)    Hemoglobin 9.8 (*)    HCT 31.5 (*)    RDW 16.3 (*)    Platelets 104 (*)    All other components within normal limits  PROTIME-INR - Abnormal; Notable for the following:    Prothrombin Time 16.7 (*)    All other components within normal limits  CULTURE, BLOOD (ROUTINE X 2)  CULTURE, BLOOD (ROUTINE X 2)  CULTURE, EXPECTORATED SPUTUM-ASSESSMENT  GRAM STAIN  LACTIC ACID, PLASMA  PROCALCITONIN  LEGIONELLA ANTIGEN, URINE  STREP PNEUMONIAE URINARY ANTIGEN  CBC  BASIC METABOLIC PANEL  I-STAT TROPOININ, ED    Imaging Review Dg Chest 2 View  04/26/2015   CLINICAL DATA:  Shortness of breath, weakness  EXAM: CHEST  2 VIEW  COMPARISON:  04/23/2015  FINDINGS: Cardiomegaly again noted. Central mild vascular congestion without convincing pulmonary edema. Bilateral small pleural effusion with bilateral basilar atelectasis or  infiltrate.  IMPRESSION: Cardiomegaly. Central mild vascular congestion without convincing pulmonary edema. Bilateral small pleural effusion with bilateral basilar atelectasis or infiltrate.   Electronically Signed   By: Lahoma Crocker M.D.   On: 04/26/2015 21:17     EKG Interpretation   Date/Time:  Thursday April 26 2015 20:36:32 EDT Ventricular Rate:  111 PR Interval:    QRS Duration: 88 QT Interval:  358 QTC Calculation: 486 R Axis:   -3 Text Interpretation:  Atrial flutter with variable A-V block with  premature ventricular or aberrantly conducted complexes Septal infarct ,  age undetermined Abnormal ECG ED PHYSICIAN INTERPRETATION AVAILABLE IN  [place] [place] Confirmed by TEST, Record ([id number]) on 04/27/2015 6:52:06 AM      MDM   Final diagnoses:  SOB (shortness of breath)  Hypoxia  Respiratory distress  Acute exacerbation of congestive heart failure    74 yo M to the ED with progressively worsening dyspnea for a week. In my evaluation of this patient's dyspnea my DDx includes, but is not limited to, pneumonia, pulmonary embolism, pneumothorax, pulmonary edema, metabolic acidosis, asthma, COPD, cardiac cause, anemia, and anxiety. However secondary to respiratory distress on exam with tachypnea, hypoxia and inability to complete sentences 2/2 breathing difficulty, crackles in bases, minimal LE edema and lack of fever, productive cough I feel his symptoms are likely 2/2 pulmonary edema 2/2 acute on chronic diastolic heart failure. His CXR showed worsening bilateral pleural effusions when compared to previous cxr from a few days prior. ECG without evidence of ischemia (as read above) Labs remarkable for negative troponin but elevated BNP (>1500) supporting acute HF exacerbation as diagnosis but without any indication for acute dialysis. Discussed case, labs and imaging with Dr. Justin Mend with nephrology and my plan to admit to medicine for further workup and likely dialysis in AM to remove some  fluid and he agreed. Dr. Posey Pronto with Internal medicine consulted and he agreed with plan and patient admitted to [facility] for further management in stable condition.     Merrily Pew, MD 04/27/15 [phone]

## 2015-04-26 NOTE — ED Notes (Signed)
Pt reports to the ED for eval of SOB and generalized weakness. Pt was recently seen and was told he had fluid around his heart and stated that he needed to go to dialysis and have fluid removed. He went to dialysis today but he did not have his full tx. He went to his PCP and had a breathing treatment and was told to follow up with cardiology. Does not have an appt until 8/8. Pt also reporting some chest tightness. Pt A&Ox4, resp e/u, and skin warm and dry.

## 2015-04-26 NOTE — ED Notes (Signed)
Ambulated to the desk and back.  O2 sat dropped to 79% and stayed there.  Good pleth noted.

## 2015-04-27 ENCOUNTER — Encounter (HOSPITAL_COMMUNITY): Payer: Self-pay

## 2015-04-27 DIAGNOSIS — D631 Anemia in chronic kidney disease: Secondary | ICD-10-CM | POA: Diagnosis present

## 2015-04-27 DIAGNOSIS — K5790 Diverticulosis of intestine, part unspecified, without perforation or abscess without bleeding: Secondary | ICD-10-CM | POA: Diagnosis present

## 2015-04-27 DIAGNOSIS — Z87891 Personal history of nicotine dependence: Secondary | ICD-10-CM | POA: Diagnosis not present

## 2015-04-27 DIAGNOSIS — I10 Essential (primary) hypertension: Secondary | ICD-10-CM | POA: Diagnosis not present

## 2015-04-27 DIAGNOSIS — Z79899 Other long term (current) drug therapy: Secondary | ICD-10-CM | POA: Diagnosis not present

## 2015-04-27 DIAGNOSIS — M109 Gout, unspecified: Secondary | ICD-10-CM | POA: Diagnosis present

## 2015-04-27 DIAGNOSIS — R0902 Hypoxemia: Secondary | ICD-10-CM | POA: Diagnosis present

## 2015-04-27 DIAGNOSIS — J9601 Acute respiratory failure with hypoxia: Secondary | ICD-10-CM | POA: Diagnosis present

## 2015-04-27 DIAGNOSIS — R000 Tachycardia, unspecified: Secondary | ICD-10-CM | POA: Diagnosis not present

## 2015-04-27 DIAGNOSIS — E041 Nontoxic single thyroid nodule: Secondary | ICD-10-CM | POA: Diagnosis present

## 2015-04-27 DIAGNOSIS — E8779 Other fluid overload: Secondary | ICD-10-CM

## 2015-04-27 DIAGNOSIS — I5033 Acute on chronic diastolic (congestive) heart failure: Secondary | ICD-10-CM | POA: Diagnosis present

## 2015-04-27 DIAGNOSIS — I120 Hypertensive chronic kidney disease with stage 5 chronic kidney disease or end stage renal disease: Secondary | ICD-10-CM | POA: Diagnosis present

## 2015-04-27 DIAGNOSIS — R918 Other nonspecific abnormal finding of lung field: Secondary | ICD-10-CM | POA: Diagnosis not present

## 2015-04-27 DIAGNOSIS — R0602 Shortness of breath: Secondary | ICD-10-CM | POA: Insufficient documentation

## 2015-04-27 DIAGNOSIS — Z992 Dependence on renal dialysis: Secondary | ICD-10-CM | POA: Diagnosis not present

## 2015-04-27 DIAGNOSIS — Z8249 Family history of ischemic heart disease and other diseases of the circulatory system: Secondary | ICD-10-CM | POA: Diagnosis not present

## 2015-04-27 DIAGNOSIS — Z95820 Peripheral vascular angioplasty status with implants and grafts: Secondary | ICD-10-CM | POA: Diagnosis not present

## 2015-04-27 DIAGNOSIS — E785 Hyperlipidemia, unspecified: Secondary | ICD-10-CM | POA: Diagnosis present

## 2015-04-27 DIAGNOSIS — I739 Peripheral vascular disease, unspecified: Secondary | ICD-10-CM | POA: Diagnosis present

## 2015-04-27 DIAGNOSIS — D696 Thrombocytopenia, unspecified: Secondary | ICD-10-CM | POA: Diagnosis not present

## 2015-04-27 DIAGNOSIS — N186 End stage renal disease: Secondary | ICD-10-CM | POA: Diagnosis present

## 2015-04-27 DIAGNOSIS — Z7902 Long term (current) use of antithrombotics/antiplatelets: Secondary | ICD-10-CM | POA: Diagnosis not present

## 2015-04-27 LAB — CBC
HCT: 31.5 % — ABNORMAL LOW (ref 39.0–52.0)
HEMOGLOBIN: 9.8 g/dL — AB (ref 13.0–17.0)
MCH: 28.2 pg (ref 26.0–34.0)
MCHC: 31.1 g/dL (ref 30.0–36.0)
MCV: 90.8 fL (ref 78.0–100.0)
PLATELETS: 104 10*3/uL — AB (ref 150–400)
RBC: 3.47 MIL/uL — ABNORMAL LOW (ref 4.22–5.81)
RDW: 16.3 % — AB (ref 11.5–15.5)
WBC: 6.3 10*3/uL (ref 4.0–10.5)

## 2015-04-27 LAB — COMPREHENSIVE METABOLIC PANEL
ALT: 14 U/L — ABNORMAL LOW (ref 17–63)
AST: 34 U/L (ref 15–41)
Albumin: 3.1 g/dL — ABNORMAL LOW (ref 3.5–5.0)
Alkaline Phosphatase: 1478 U/L — ABNORMAL HIGH (ref 38–126)
Anion gap: 14 (ref 5–15)
BUN: 18 mg/dL (ref 6–20)
CALCIUM: 9.6 mg/dL (ref 8.9–10.3)
CO2: 33 mmol/L — ABNORMAL HIGH (ref 22–32)
Chloride: 96 mmol/L — ABNORMAL LOW (ref 101–111)
Creatinine, Ser: 6.85 mg/dL — ABNORMAL HIGH (ref 0.61–1.24)
GFR, EST AFRICAN AMERICAN: 8 mL/min — AB (ref >60)
GFR, EST NON AFRICAN AMERICAN: 7 mL/min — AB (ref >60)
Glucose, Bld: 102 mg/dL — ABNORMAL HIGH (ref 65–99)
POTASSIUM: 3.6 mmol/L (ref 3.5–5.1)
Sodium: 143 mmol/L (ref 135–145)
Total Bilirubin: 0.9 mg/dL (ref 0.3–1.2)
Total Protein: 6 g/dL — ABNORMAL LOW (ref 6.5–8.1)

## 2015-04-27 LAB — LACTIC ACID, PLASMA: Lactic Acid, Venous: 1.4 mmol/L (ref 0.5–2.0)

## 2015-04-27 LAB — PROTIME-INR
INR: 1.34 (ref 0.00–1.49)
PROTHROMBIN TIME: 16.7 s — AB (ref 11.6–15.2)

## 2015-04-27 LAB — PROCALCITONIN: Procalcitonin: 0.71 ng/mL

## 2015-04-27 MED ORDER — ACETAMINOPHEN 325 MG PO TABS
650.0000 mg | ORAL_TABLET | Freq: Four times a day (QID) | ORAL | Status: DC | PRN
  Administered 2015-04-27: 650 mg via ORAL
  Filled 2015-04-27 (×2): qty 2

## 2015-04-27 MED ORDER — BENZONATATE 100 MG PO CAPS
200.0000 mg | ORAL_CAPSULE | Freq: Three times a day (TID) | ORAL | Status: DC
  Administered 2015-04-27 – 2015-04-29 (×7): 200 mg via ORAL
  Filled 2015-04-27 (×11): qty 2

## 2015-04-27 MED ORDER — ACETAMINOPHEN 650 MG RE SUPP: 650.0000 mg | Freq: Four times a day (QID) | RECTAL | Status: DC | PRN

## 2015-04-27 MED ORDER — PANTOPRAZOLE SODIUM 40 MG PO TBEC
40.0000 mg | DELAYED_RELEASE_TABLET | Freq: Every day | ORAL | Status: DC
  Administered 2015-04-27 – 2015-04-29 (×3): 40 mg via ORAL
  Filled 2015-04-27: qty 1

## 2015-04-27 MED ORDER — GUAIFENESIN ER 600 MG PO TB12
600.0000 mg | ORAL_TABLET | Freq: Two times a day (BID) | ORAL | Status: DC
  Administered 2015-04-27 – 2015-04-29 (×5): 600 mg via ORAL
  Filled 2015-04-27 (×6): qty 1

## 2015-04-27 MED ORDER — VANCOMYCIN HCL IN DEXTROSE 750-5 MG/150ML-% IV SOLN
750.0000 mg | INTRAVENOUS | Status: DC
  Filled 2015-04-27: qty 150

## 2015-04-27 MED ORDER — CLOPIDOGREL BISULFATE 75 MG PO TABS
75.0000 mg | ORAL_TABLET | Freq: Every day | ORAL | Status: DC
  Administered 2015-04-27 – 2015-04-29 (×3): 75 mg via ORAL
  Filled 2015-04-27 (×3): qty 1

## 2015-04-27 MED ORDER — ATORVASTATIN CALCIUM 20 MG PO TABS
20.0000 mg | ORAL_TABLET | Freq: Every day | ORAL | Status: DC
  Administered 2015-04-27 – 2015-04-28 (×2): 20 mg via ORAL
  Filled 2015-04-27 (×3): qty 1

## 2015-04-27 MED ORDER — ONDANSETRON HCL 4 MG PO TABS: 4.0000 mg | ORAL_TABLET | Freq: Four times a day (QID) | ORAL | Status: DC | PRN

## 2015-04-27 MED ORDER — HEPARIN SODIUM (PORCINE) 5000 UNIT/ML IJ SOLN
5000.0000 [IU] | Freq: Three times a day (TID) | INTRAMUSCULAR | Status: DC
  Administered 2015-04-27 – 2015-04-29 (×7): 5000 [IU] via SUBCUTANEOUS
  Filled 2015-04-27 (×10): qty 1

## 2015-04-27 MED ORDER — RENA-VITE PO TABS
1.0000 | ORAL_TABLET | Freq: Every day | ORAL | Status: DC
  Administered 2015-04-27 – 2015-04-28 (×2): 1 via ORAL
  Filled 2015-04-27 (×4): qty 1

## 2015-04-27 MED ORDER — SIMVASTATIN 40 MG PO TABS
40.0000 mg | ORAL_TABLET | Freq: Every evening | ORAL | Status: DC
  Filled 2015-04-27: qty 1

## 2015-04-27 MED ORDER — DEXTROSE 5 % IV SOLN
2.0000 g | INTRAVENOUS | Status: DC
  Administered 2015-04-27 – 2015-04-28 (×2): 2 g via INTRAVENOUS
  Filled 2015-04-27 (×3): qty 2

## 2015-04-27 MED ORDER — AMLODIPINE BESYLATE 5 MG PO TABS
5.0000 mg | ORAL_TABLET | Freq: Every day | ORAL | Status: DC
  Administered 2015-04-27 – 2015-04-29 (×3): 5 mg via ORAL
  Filled 2015-04-27 (×3): qty 1

## 2015-04-27 MED ORDER — LANTHANUM CARBONATE 500 MG PO CHEW
1000.0000 mg | CHEWABLE_TABLET | Freq: Two times a day (BID) | ORAL | Status: DC
  Administered 2015-04-27: 1000 mg via ORAL
  Filled 2015-04-27 (×5): qty 2

## 2015-04-27 MED ORDER — DEXTROSE 5 % IV SOLN
1.0000 g | Freq: Once | INTRAVENOUS | Status: AC
  Administered 2015-04-27: 1 g via INTRAVENOUS
  Filled 2015-04-27: qty 1

## 2015-04-27 MED ORDER — VANCOMYCIN HCL IN DEXTROSE 750-5 MG/150ML-% IV SOLN
750.0000 mg | Freq: Once | INTRAVENOUS | Status: AC
  Administered 2015-04-27: 750 mg via INTRAVENOUS
  Filled 2015-04-27: qty 150

## 2015-04-27 MED ORDER — ONDANSETRON HCL 4 MG/2ML IJ SOLN: 4.0000 mg | Freq: Four times a day (QID) | INTRAMUSCULAR | Status: DC | PRN

## 2015-04-27 MED ORDER — VANCOMYCIN HCL 10 G IV SOLR
1500.0000 mg | Freq: Once | INTRAVENOUS | Status: AC
  Administered 2015-04-27: 1500 mg via INTRAVENOUS
  Filled 2015-04-27: qty 1500

## 2015-04-27 MED ORDER — METOPROLOL TARTRATE 25 MG PO TABS
25.0000 mg | ORAL_TABLET | Freq: Two times a day (BID) | ORAL | Status: DC
  Administered 2015-04-27 – 2015-04-29 (×5): 25 mg via ORAL
  Filled 2015-04-27 (×6): qty 1

## 2015-04-27 MED ORDER — DEXTROSE 5 % IV SOLN: 2.0000 g | Freq: Three times a day (TID) | INTRAVENOUS | Status: DC

## 2015-04-27 MED ORDER — SODIUM CHLORIDE 0.9 % IJ SOLN
3.0000 mL | Freq: Two times a day (BID) | INTRAMUSCULAR | Status: DC
  Administered 2015-04-27 – 2015-04-28 (×4): 3 mL via INTRAVENOUS

## 2015-04-27 NOTE — H&P (Signed)
[facility] History and Physical  Patient: Tim Kirby  MRN: [id number]  DOB: 1940-11-09  DOS: the patient was seen and examined on 04/27/2015 PCP: Sherril Croon, MD  Referring physician: Dr. Dolly Rias Chief Complaint: Shortness of breath  HPI: Tim Kirby is a 74 y.o. male with Past medical history of dyslipidemia, ESRD on hemodialysis Tuesday Thursday Saturday, essential hypertension, chronic anemia, peripheral vascular disease. The patient is presenting with complaints of shortness of breath ongoing since last one week progressively worsening associated with clear sputum production without any blood. He also had some chest tightness which is currently resolved. No nausea no vomiting or choking episode. No fever no chills. No recent change in medication. The patient was recently seen in urgent care for the same complaint and was started on azithromycin. Later on resolved on the ER at least and his CT scan ruled out pulmonary embolism but also showed some possible pulmonary and pleural effusion as well as possible infiltrate. Since the patient did not have any fever or chills or any other symptoms of infection he was discharged without any antibiotics. No otalgia, vomiting, no recent travel or surgery reported.  The patient is coming from home.  At his baseline ambulates without any support And is independent for most of his ADL manages her medication on his own.  Review of Systems: as mentioned in the history of present illness.  A comprehensive review of the other systems is negative.  Past Medical History  Diagnosis Date  . Hyperlipidemia   . Gout   . Secondary hyperparathyroidism   . Anemia   . Colon, diverticulosis   . Thyroid nodule   . Anginal pain     sees Dr. Lia Foyer  . Hypertension     sees Dr. Willy Eddy  . Chronic kidney disease     Tues, thurs, sat  . Aneurysm artery, popliteal   . Upper respiratory infection April 2016  . Renal insufficiency    Past  Surgical History  Procedure Laterality Date  . Knee surgery      Left cyst removal by Dr. Lorin Mercy  . Av fistula placement      left UA AVF  . Femoral-popliteal bypass graft  10/18/2012    Procedure: BYPASS GRAFT FEMORAL-POPLITEAL ARTERY;  Surgeon: Conrad [place], MD;  Location: [facility] OR;  Service: Vascular;  Laterality: Right;  Right Popliteal Aneurysm Exclusion; Ultrasound guided  . False aneurysm repair Left 12/29/2012    Procedure: REPAIR FALSE ANEURYSM;  Surgeon: Conrad Allerton, MD;  Location: [place];  Service: Vascular;  Laterality: Left;  . Endovascular repair of popliteal artery aneurysm Left 12/29/2012    Procedure: ENDOVASCULAR REPAIR OF POPLITEAL AND FEMORAL ARTERY ANEURYSM;  Surgeon: Conrad [place], MD;  Location: [place];  Service: Vascular;  Laterality: Left;  . Abdominal aortagram N/A 09/27/2012    Procedure: ABDOMINAL Maxcine Ham;  Surgeon: Angelia Mould, MD;  Location: [facility] CATH LAB;  Service: Cardiovascular;  Laterality: N/A;   Social History:  reports that he quit smoking about 7 years ago. His smoking use included Cigarettes and Pipe. He has never used smokeless tobacco. He reports that he does not drink alcohol or use illicit drugs.  No Known Allergies  Family History  Problem Relation Age of Onset  . Hypertension Mother   . Diabetes Mother   . Diabetes Brother     Prior to Admission medications   Medication Sig Start Date End Date Taking? Authorizing Provider  Albuterol Sulfate 108 (90 BASE) MCG/ACT  AEPB Inhale 2 puffs into the lungs every 4 (four) hours as needed.   Yes Historical Provider, MD  amLODipine (NORVASC) 5 MG tablet Take 5 mg by mouth daily.  02/01/15  Yes Historical Provider, MD  benzonatate (TESSALON) 200 MG capsule Take 200 mg by mouth every 8 (eight) hours. 04/21/15  Yes Historical Provider, MD  clopidogrel (PLAVIX) 75 MG tablet Take 1 tablet (75 mg total) by mouth daily. 06/02/14  Yes Conrad Estelle, MD  lanthanum (FOSRENOL) 1000 MG chewable tablet Chew 1,000 mg by  mouth 2 (two) times daily with a meal.   Yes Historical Provider, MD  metoprolol tartrate (LOPRESSOR) 25 MG tablet Take 25 mg by mouth 2 (two) times daily.   Yes Historical Provider, MD  multivitamin (RENA-VIT) TABS tablet Take 1 tablet by mouth daily.   Yes Historical Provider, MD  omeprazole (PRILOSEC) 40 MG capsule Take 40 mg by mouth daily.   Yes Historical Provider, MD  simvastatin (ZOCOR) 40 MG tablet Take 40 mg by mouth every evening.   Yes Historical Provider, MD    Physical Exam: Filed Vitals:   04/27/15 0015 04/27/15 0030 04/27/15 0036 04/27/15 0051  BP: 138/88 155/80  156/102  Pulse: 79 79  83  Temp:   98.6 F (37 C) 98 F (36.7 C)  TempSrc:   Oral Oral  Resp: 24 27  22   Height:    5\' 8"  (1.727 m)  Weight:    77.792 kg (171 lb 8 oz)  SpO2: 93% 100%  97%    General: Alert, Awake and Oriented to Time, Place and Person. Appear in mild distress Eyes: PERRL ENT: Oral Mucosa clear moist. Neck: no JVD Cardiovascular: S1 and S2 Present, no Murmur, Peripheral Pulses Present Respiratory: Bilateral Air entry equal and Decreased,  Clear to Auscultation, basal Crackles, no wheezes Abdomen: Bowel Sound present, Soft and non tender Skin: no Rash Extremities: Trace Pedal edema, no calf tenderness Neurologic: Grossly no focal neuro deficit.  Labs on Admission:  CBC:  Recent Labs Lab 04/23/15 0920 04/26/15 2046 04/27/15 0232  WBC 7.2 6.8 6.3  HGB 11.7* 11.0* 9.8*  HCT 35.8* 35.3* 31.5*  MCV 89.3 90.3 90.8  PLT 120* 115* 104*    CMP     Component Value Date/Time   NA 143 04/27/2015 0232   K 3.6 04/27/2015 0232   CL 96* 04/27/2015 0232   CO2 33* 04/27/2015 0232   GLUCOSE 102* 04/27/2015 0232   BUN 18 04/27/2015 0232   CREATININE 6.85* 04/27/2015 0232   CREATININE 9.25* 10/01/2012 1510   CALCIUM 9.6 04/27/2015 0232   PROT 6.0* 04/27/2015 0232   ALBUMIN 3.1* 04/27/2015 0232   AST 34 04/27/2015 0232   ALT 14* 04/27/2015 0232   ALKPHOS 1478* 04/27/2015 0232    BILITOT 0.9 04/27/2015 0232   GFRNONAA 7* 04/27/2015 0232   GFRAA 8* 04/27/2015 0232    No results for input(s): LIPASE, AMYLASE in the last 168 hours.  No results for input(s): CKTOTAL, CKMB, CKMBINDEX, TROPONINI in the last 168 hours. BNP (last 3 results)  Recent Labs  04/26/15 2046  BNP 1518.4*    ProBNP (last 3 results) No results for input(s): PROBNP in the last 8760 hours.   Radiological Exams on Admission: Dg Chest 2 View  04/26/2015   CLINICAL DATA:  Shortness of breath, weakness  EXAM: CHEST  2 VIEW  COMPARISON:  04/23/2015  FINDINGS: Cardiomegaly again noted. Central mild vascular congestion without convincing pulmonary edema. Bilateral small pleural effusion with  bilateral basilar atelectasis or infiltrate.  IMPRESSION: Cardiomegaly. Central mild vascular congestion without convincing pulmonary edema. Bilateral small pleural effusion with bilateral basilar atelectasis or infiltrate.   Electronically Signed   By: Lahoma Crocker M.D.   On: 04/26/2015 21:17   EKG: Independently reviewed. there are no previous tracings available for comparison, normal sinus rhythm.  Assessment/Plan Principal Problem:   Hypoxia Active Problems:   Essential hypertension   End stage renal disease   PVD (peripheral vascular disease)   Volume overload   Pulmonary infiltrate   1. Hypoxia Patient is presenting with complaints of progressively worsening shortness of breath associated with cough. Patient had a CAT scan 3 days ago which did show he has bilateral pleural effusion with associated consultation. Although he did not have any fever or chills or cough with sputum production and therefore he was taken off of antibiotics. He continues to have some positive dictation on chest x-ray today as well. I would check pro calcitonin level and if elevated will put the patient on antibiotics. Follow cultures. I would use incentive spirometry. Nephrology is a Reece Levy consulted to remove the  fluids.  2. Chronic diastolic heart failure with possible acute component. Patient's chest x-ray shows vascular congestion as well as he has not been able to complete his hemodialysis treatment today as he has to go to cardiology appointment. Also chest x-ray shows bilateral moderate pleural effusion. With this the patient was discussed with nephrology will be following up with the patient. Currently avoid IV hydration. Continue home medications.  3. ESRD on hemodialysis. Continue with hemodialysis Tuesday Thursday Saturday. Nephrology is already consulted.  4. Peripheral vascular disease. Continuing Plavix.  Advance goals of care discussion: Full code   Consults: ED discussed with Dr. Justin Mend from nephrology.  DVT Prophylaxis: subcutaneous Heparin Nutrition: Renal diet  Family Communication: family was present at bedside, opportunity was given to ask question and all questions were answered satisfactorily at the time of interview. Disposition: Admitted as inpatient, telemetry unit.  Author: Berle Mull, MD [facility] Pager: [phone] 04/27/2015  If 7PM-7AM, please contact night-coverage [email or web address] Password [email or web address]

## 2015-04-27 NOTE — Consult Note (Signed)
[facility] Renal Consultation Note    Indication for Consultation:  Management of ESRD/hemodialysis; anemia, hypertension/volume and secondary hyperparathyroidism PCP:  No PCP  HPI: Tim Kirby is a 74 y.o. male with ESRD who has hemodialysis MWF at [facility]. Past medical history significant for hypertension, gout, secondary hyperparathyroidism, diverticulosis, angina, PAD.   Patient presented to ED last PM with C/O SOB, some chest tightness. Went to HD 04/26/2015 but left treatment early. Patient actually left under EDW in spite of shortened treatment Was seen in ED 04/23/2015 with similar complaint after EDW had been raised in center to 82 KG. Patient on HD now, Denies chest pain, SOB, fever, chills, N,V,D. HA, States he has had irregular heart rhythm. Admission EKG shows AFlutter with variable conduction. Chest Xray shows  Mild central vascular congestion with small bilateral pleural effusion with bilateral basilar atelectasis or infiltrate. Patient was treated at urgent care recently for URI with zithromax.   Patient attends [facility].  Last in center HGB 10.5 (04/19/15). 03/29/15 Ca 9.2 C Ca 9.8 PTH 1832 (03/29/15).   Past Medical History  Diagnosis Date  . Hyperlipidemia   . Gout   . Secondary hyperparathyroidism   . Anemia   . Colon, diverticulosis   . Thyroid nodule   . Anginal pain     sees Dr. Lia Foyer  . Hypertension     sees Dr. Willy Eddy  . Chronic kidney disease     Tues, thurs, sat  . Aneurysm artery, popliteal   . Upper respiratory infection April 2016  . Renal insufficiency    Past Surgical History  Procedure Laterality Date  . Knee surgery      Left cyst removal by Dr. Lorin Mercy  . Av fistula placement      left UA AVF  . Femoral-popliteal bypass graft  10/18/2012    Procedure: BYPASS GRAFT FEMORAL-POPLITEAL ARTERY;  Surgeon: Conrad De Motte, MD;  Location: [facility] OR;  Service: Vascular;  Laterality: Right;  Right Popliteal  Aneurysm Exclusion; Ultrasound guided  . False aneurysm repair Left 12/29/2012    Procedure: REPAIR FALSE ANEURYSM;  Surgeon: Conrad Cre[place], MD;  Location: [facility];  Service: Vascular;  Laterality: Left;  . Endovascular repair of popliteal artery aneurysm Left 12/29/2012    Procedure: ENDOVASCULAR REPAIR OF POPLITEAL AND FEMORAL ARTERY ANEURYSM;  Surgeon: Conrad Moody, MD;  Location: [place];  Service: Vascular;  Laterality: Left;  . Abdominal aortagram N/A 09/27/2012    Procedure: ABDOMINAL Maxcine Ham;  Surgeon: Angelia Mould, MD;  Location: [facility] CATH LAB;  Service: Cardiovascular;  Laterality: N/A;   Family History  Problem Relation Age of Onset  . Hypertension Mother   . Diabetes Mother   . Diabetes Brother    Social History:  reports that he quit smoking about 7 years ago. His smoking use included Cigarettes and Pipe. He has never used smokeless tobacco. He reports that he does not drink alcohol or use illicit drugs. No Known Allergies Prior to Admission medications   Medication Sig Start Date End Date Taking? Authorizing Provider  Albuterol Sulfate 108 (90 BASE) MCG/ACT AEPB Inhale 2 puffs into the lungs every 4 (four) hours as needed.   Yes Historical Provider, MD  amLODipine (NORVASC) 5 MG tablet Take 5 mg by mouth daily.  02/01/15  Yes Historical Provider, MD  benzonatate (TESSALON) 200 MG capsule Take 200 mg by mouth every 8 (eight) hours. 04/21/15  Yes Historical Provider, MD  clopidogrel (PLAVIX) 75 MG tablet  Take 1 tablet (75 mg total) by mouth daily. 06/02/14  Yes Conrad [place], MD  lanthanum (FOSRENOL) 1000 MG chewable tablet Chew 1,000 mg by mouth 2 (two) times daily with a meal.   Yes Historical Provider, MD  metoprolol tartrate (LOPRESSOR) 25 MG tablet Take 25 mg by mouth 2 (two) times daily.   Yes Historical Provider, MD  multivitamin (RENA-VIT) TABS tablet Take 1 tablet by mouth daily.   Yes Historical Provider, MD  omeprazole (PRILOSEC) 40 MG capsule Take 40 mg by mouth daily.    Yes Historical Provider, MD  simvastatin (ZOCOR) 40 MG tablet Take 40 mg by mouth every evening.   Yes Historical Provider, MD   Current Facility-Administered Medications  Medication Dose Route Frequency Provider Last Rate Last Dose  . acetaminophen (TYLENOL) tablet 650 mg  650 mg Oral Q6H PRN Lavina Hamman, MD       Or  . acetaminophen (TYLENOL) suppository 650 mg  650 mg Rectal Q6H PRN Lavina Hamman, MD      . amLODipine (NORVASC) tablet 5 mg  5 mg Oral Daily Lavina Hamman, MD      . benzonatate (TESSALON) capsule 200 mg  200 mg Oral 3 times per day Lavina Hamman, MD      . cefTAZidime (FORTAZ) 2 g in dextrose 5 % 50 mL IVPB  2 g Intravenous Q T,Th,Sa-HD Eudelia Bunch, RPH   2 g at 04/27/15 0559  . clopidogrel (PLAVIX) tablet 75 mg  75 mg Oral Daily Lavina Hamman, MD      . guaiFENesin [facility]) 12 hr tablet 600 mg  600 mg Oral BID Lavina Hamman, MD      . heparin injection 5,000 Units  5,000 Units Subcutaneous 3 times per day Lavina Hamman, MD   5,000 Units at 04/27/15 0559  . lanthanum (FOSRENOL) chewable tablet 1,000 mg  1,000 mg Oral BID WC Lavina Hamman, MD   Stopped at 04/27/15 0800  . metoprolol tartrate (LOPRESSOR) tablet 25 mg  25 mg Oral BID Lavina Hamman, MD      . multivitamin (RENA-VIT) tablet 1 tablet  1 tablet Oral QHS Lavina Hamman, MD      . ondansetron [facility]) tablet 4 mg  4 mg Oral Q6H PRN Lavina Hamman, MD       Or  . ondansetron [facility]) injection 4 mg  4 mg Intravenous Q6H PRN Lavina Hamman, MD      . pantoprazole (PROTONIX) EC tablet 40 mg  40 mg Oral Daily Lavina Hamman, MD      . simvastatin (ZOCOR) tablet 40 mg  40 mg Oral QPM Lavina Hamman, MD      . sodium chloride 0.9 % injection 3 mL  3 mL Intravenous Q12H Lavina Hamman, MD   3 mL at 04/27/15 0227  . vancomycin (VANCOCIN) 1,500 mg in sodium chloride 0.9 % 500 mL IVPB  1,500 mg Intravenous Once Erenest Blank, RPH      . [START ON 04/28/2015] vancomycin (VANCOCIN) IVPB 750 mg/150 ml premix   750 mg Intravenous Q T,Th,Sa-HD Theodis Blaze, MD       Labs: Basic Metabolic Panel:  Recent Labs Lab 04/23/15 0920 04/26/15 2046 04/27/15 0232  NA 141 141 143  K 3.7 3.4* 3.6  CL 97* 94* 96*  CO2 28 32 33*  GLUCOSE 114* 117* 102*  BUN 23* 13 18  CREATININE 8.64* 6.39* 6.85*  CALCIUM 9.8 10.0 9.6   Liver Function Tests:  Recent Labs Lab 04/27/15 0232  AST 34  ALT 14*  ALKPHOS 1478*  BILITOT 0.9  PROT 6.0*  ALBUMIN 3.1*   No results for input(s): LIPASE, AMYLASE in the last 168 hours. No results for input(s): AMMONIA in the last 168 hours. CBC:  Recent Labs Lab 04/23/15 0920 04/26/15 2046 04/27/15 0232  WBC 7.2 6.8 6.3  HGB 11.7* 11.0* 9.8*  HCT 35.8* 35.3* 31.5*  MCV 89.3 90.3 90.8  PLT 120* 115* 104*   Cardiac Enzymes: No results for input(s): CKTOTAL, CKMB, CKMBINDEX, TROPONINI in the last 168 hours. CBG: No results for input(s): GLUCAP in the last 168 hours. Iron Studies: No results for input(s): IRON, TIBC, TRANSFERRIN, FERRITIN in the last 72 hours. Studies/Results: Dg Chest 2 View  04/26/2015   CLINICAL DATA:  Shortness of breath, weakness  EXAM: CHEST  2 VIEW  COMPARISON:  04/23/2015  FINDINGS: Cardiomegaly again noted. Central mild vascular congestion without convincing pulmonary edema. Bilateral small pleural effusion with bilateral basilar atelectasis or infiltrate.  IMPRESSION: Cardiomegaly. Central mild vascular congestion without convincing pulmonary edema. Bilateral small pleural effusion with bilateral basilar atelectasis or infiltrate.   Electronically Signed   By: Lahoma Crocker M.D.   On: 04/26/2015 21:17    ROS: As per HPI otherwise negative.   Physical Exam: Filed Vitals:   04/27/15 1000 04/27/15 1030 04/27/15 1100 04/27/15 1130  BP: 117/82 151/92 134/99 128/90  Pulse: 70 68 69 73  Temp:      TempSrc:      Resp: 29 24 24 26   Height:      Weight:      SpO2: 98% 98% 96% 100%     General: Well developed, well nourished, in no acute  distress. Head: Normocephalic, atraumatic, sclera non-icteric, mucus membranes are moist Neck: Supple. JVD not elevated. Lungs: Clear bilaterally to auscultation without wheezes, rales, or rhonchi. Breathing is unlabored. Heart: RRR with S1 S2. SR with PACS on monitor.  Abdomen: Soft, non-tender, non-distended with normoactive bowel sounds. No rebound/guarding. No obvious abdominal masses. M-S:  Strength and tone appear normal for age. Lower extremities:without edema or ischemic changes, no open wounds  Neuro: Alert and oriented X 3. Moves all extremities spontaneously. Psych:  Responds to questions appropriately with a normal affect. Dialysis Access: LFA AVF  Dialysis Orders: Center: [facility]  on MWF . EDW 78.5 HD [place] 2.0 K 2.0 ca 1 mg  Time 3 hours Heparin 5000 units per TX. Access LFA AVF BFR 450 DFR [place] 1.5   Mircera 50 mcg IV q 2 weeks (last dose 04/08/15) Hectoral 5 mcg IV MWF   Assessment/Plan: 1.  Hypoxia: per primary. Started on Vanc empirically.  2.  ESRD -  MWF [place]. HD today.  3.  Hypertension/volume  - HD today. Net UF 3800. Post 73.3 Needs new lower EDW when DCd. BP Stable. Amlodipine 5 mg PO q        hs and Metoprolol 25 mg PO BID per OP med list.  4.  Anemia  - Hgb 9.8. Follow CBC.  5.  Metabolic bone disease -  Ca 9.6 C Ca 10.32 Last in center Phos 5.3 (03/29/15). Continue OP binders. 6.  Nutrition - Albumin 3.1 Renal diet, nepro, renal vit.    Danai Gotto H. Owens Shark, NP-C 04/27/2015, 12:03 PM  [employer] [phone]

## 2015-04-27 NOTE — ED Notes (Signed)
Attempted report X 1.  Nurse to call back shortly.

## 2015-04-27 NOTE — Procedures (Signed)
I was present at this dialysis session. I have reviewed the session itself and made appropriate changes.   UF goal 4L.  Left HD 7/21 after 2.5h at 79.2kg.  EDW was down to 80kg.  Cont aggressive UF. P lan for HD today andagain tomorrow.    Pearson Grippe  MD 04/27/2015, 8:46 AM

## 2015-04-27 NOTE — Progress Notes (Signed)
Pt admitted after midnight, please see earlier admission note by Dr. Posey Pronto. Please note that pt was admitted with dyspnea and cough productive of clear sputum. Pt admitted for treatment of pulmonary vascular congestion, volume control with HD. Agree with broad spectrum ABX for now and following on sputum cultures, urine legionella and strep pneumo. Repeat BMP and CBC in AM.  Faye Ramsay, MD  [facility] Pager [phone]  If 7PM-7AM, please contact night-coverage [email or web address] Password [email or web address]

## 2015-04-27 NOTE — Progress Notes (Signed)
Utilization review completed. Jerame Hedding, RN, BSN. 

## 2015-04-27 NOTE — Progress Notes (Signed)
ANTIBIOTIC CONSULT NOTE - INITIAL  Pharmacy Consult for Vancomycin (already on Ceftazidime) Indication: pneumonia  No Known Allergies  Patient Measurements: Height: 5\' 8"  (172.7 cm) Weight: 171 lb 8 oz (77.792 kg) IBW/kg (Calculated) : 68.4  Vital Signs: Temp: 98 F (36.7 C) (07/22 0051) Temp Source: Oral (07/22 0051) BP: 156/102 mmHg (07/22 0051) Pulse Rate: 83 (07/22 0051)  Labs:  Recent Labs  04/26/15 2046 04/27/15 0232  WBC 6.8 6.3  HGB 11.0* 9.8*  PLT 115* 104*  CREATININE 6.39* 6.85*   Estimated Creatinine Clearance: 9.3 mL/min (by C-G formula based on Cr of 6.85).  Medical History: Past Medical History  Diagnosis Date  . Hyperlipidemia   . Gout   . Secondary hyperparathyroidism   . Anemia   . Colon, diverticulosis   . Thyroid nodule   . Anginal pain     sees Dr. Lia Foyer  . Hypertension     sees Dr. Willy Eddy  . Chronic kidney disease     Tues, thurs, sat  . Aneurysm artery, popliteal   . Upper respiratory infection April 2016  . Renal insufficiency     Assessment: 74 y/o M with ESRD on HD TTS to start vancomycin per pharmacy for r/o PNA. Already on Ceftazidime per MD. WBC WNL. Other labs as above.   Goal of Therapy:  Pre-HD vancomycin level 15-25 mg/L  Plan:  -Vancomycin 1500 mg IV x 1, then 750 mg IV qHD (TTS), may need additional supplementation if he gets an extra HD session -Ceftazidime per MD  -Drug levels as indicated   Narda Bonds 04/27/2015,5:36 AM

## 2015-04-28 DIAGNOSIS — N186 End stage renal disease: Secondary | ICD-10-CM

## 2015-04-28 LAB — BASIC METABOLIC PANEL
ANION GAP: 12 (ref 5–15)
BUN: 15 mg/dL (ref 6–20)
CALCIUM: 9.9 mg/dL (ref 8.9–10.3)
CO2: 31 mmol/L (ref 22–32)
Chloride: 93 mmol/L — ABNORMAL LOW (ref 101–111)
Creatinine, Ser: 5.32 mg/dL — ABNORMAL HIGH (ref 0.61–1.24)
GFR calc non Af Amer: 10 mL/min — ABNORMAL LOW (ref >60)
GFR, EST AFRICAN AMERICAN: 11 mL/min — AB (ref >60)
GLUCOSE: 93 mg/dL (ref 65–99)
POTASSIUM: 4 mmol/L (ref 3.5–5.1)
Sodium: 136 mmol/L (ref 135–145)

## 2015-04-28 LAB — CBC
HEMATOCRIT: 34.8 % — AB (ref 39.0–52.0)
HEMOGLOBIN: 10.9 g/dL — AB (ref 13.0–17.0)
MCH: 28.2 pg (ref 26.0–34.0)
MCHC: 31.3 g/dL (ref 30.0–36.0)
MCV: 90.2 fL (ref 78.0–100.0)
Platelets: 107 10*3/uL — ABNORMAL LOW (ref 150–400)
RBC: 3.86 MIL/uL — ABNORMAL LOW (ref 4.22–5.81)
RDW: 16.1 % — ABNORMAL HIGH (ref 11.5–15.5)
WBC: 6.6 10*3/uL (ref 4.0–10.5)

## 2015-04-28 LAB — GLUCOSE, CAPILLARY: Glucose-Capillary: 101 mg/dL — ABNORMAL HIGH (ref 65–99)

## 2015-04-28 MED ORDER — LANTHANUM CARBONATE 500 MG PO CHEW
1000.0000 mg | CHEWABLE_TABLET | Freq: Two times a day (BID) | ORAL | Status: DC
  Administered 2015-04-28 – 2015-04-29 (×2): 1000 mg via ORAL
  Filled 2015-04-28 (×5): qty 2

## 2015-04-28 NOTE — Progress Notes (Signed)
Patient ID: Tim Kirby, male   DOB: 02-01-1941, 74 y.o.   MRN: [id number]  [facility] PROGRESS NOTE  Tim Kirby YIF:[id number] DOB: 04-01-1941 DOA: 04/26/2015 PCP: Sherril Croon, MD   Brief narrative:    74 y.o. male with ESRD on hemodialysis Tuesday Thursday Saturday, essential hypertension, chronic anemia, peripheral vascular disease presented to [facility] ED with main concern of progressive shortness of breath with exertion and at rest.   Assessment/Plan:    Principal Problem:   Acute respiratory failure with hypoxia - secondary to pulmonary vascular congestion in the setting of ESRD - volume control with HD - respiratory status is stable this AM - HD per nephrology team - less likely PNA as pt is afebrile, no productive cough, WBC is stable WNL - will d/c broad spectrum ABX  Active Problems:   Essential hypertension - SBP in 80's this AM but up to 110's, HD tolerated well  - pt is on metoprolol and norvasc, may need to hold is SBP < 90    End stage renal disease on HD TTS - per nephrology team     HLD - continue statin     Anemia of chronic disease, ESRD - no signs of active bleeding - CBC in AM    Thrombocytopenia  - close monitoring as pt is on Plavix - no signs of bleeding    DVT prophylaxis - Heparin SQ  Code Status: Full.  Family Communication:  plan of care discussed with the patient Disposition Plan: Home when stable.   IV access:  Peripheral IV  Procedures and diagnostic studies:    Dg Chest 2 View 04/26/2015   Cardiomegaly. Central mild vascular congestion without convincing pulmonary edema. Bilateral small pleural effusion with bilateral basilar atelectasis or infiltrate.     Dg Chest 2 View 04/23/2015  Findings most likely represent a degree of chronic congestive heart failure. Stable interstitial edema and cardiomegaly. No frank airspace consolidation.     Medical Consultants:  Nephrology   Other Consultants:   None  IAnti-Infectives:    Vanc and Tressie Ellis stopped 7/23   Faye Ramsay, MD  [facility] Pager [phone]  If 7PM-7AM, please contact night-coverage [email or web address] Password [email or web address] 04/28/2015, 3:05 PM   LOS: 1 day   HPI/Subjective: No events overnight.   Objective: Filed Vitals:   04/28/15 1222 04/28/15 1226 04/28/15 1244 04/28/15 1407  BP: 83/59 82/62 96/59  104/71  Pulse: 147 59 119 116  Temp:   97.2 F (36.2 C) 97.3 F (36.3 C)  TempSrc:   Oral Oral  Resp:   18 18  Height:      Weight:      SpO2:   91% 96%    Intake/Output Summary (Last 24 hours) at 04/28/15 1505 Last data filed at 04/28/15 1446  Gross per 24 hour  Intake    840 ml  Output   2178 ml  Net  -1338 ml    Exam:   General:  Pt is alert, follows commands appropriately, not in acute distress  Cardiovascular: Regular rhythm, tachycardic, no rubs, no gallops  Respiratory: Clear to auscultation bilaterally, no wheezing, no crackles, no rhonchi  Abdomen: Soft, non tender, non distended, bowel sounds present, no guarding  Extremities: pulses DP and PT palpable bilaterally  Neuro: Grossly nonfocal  Data Reviewed: Basic Metabolic Panel:  Recent Labs Lab 04/23/15 0920 04/26/15 2046 04/27/15 0232 04/28/15 0255  NA 141 141 143 136  K 3.7 3.4* 3.6 4.0  CL 97* 94* 96* 93*  CO2  28 32 33* 31  GLUCOSE 114* 117* 102* 93  BUN 23* 13 18 15   CREATININE 8.64* 6.39* 6.85* 5.32*  CALCIUM 9.8 10.0 9.6 9.9   Liver Function Tests:  Recent Labs Lab 04/27/15 0232  AST 34  ALT 14*  ALKPHOS 1478*  BILITOT 0.9  PROT 6.0*  ALBUMIN 3.1*   CBC:  Recent Labs Lab 04/23/15 0920 04/26/15 2046 04/27/15 0232 04/28/15 0255  WBC 7.2 6.8 6.3 6.6  HGB 11.7* 11.0* 9.8* 10.9*  HCT 35.8* 35.3* 31.5* 34.8*  MCV 89.3 90.3 90.8 90.2  PLT 120* 115* 104* 107*    Recent Results (from the past 240 hour(s))  Culture, blood (routine x 2) Call MD if unable to obtain prior to antibiotics being given     Status:  None (Preliminary result)   Collection Time: 04/27/15  6:48 AM  Result Value Ref Range Status   Specimen Description BLOOD LEFT ANTECUBITAL  Final   Special Requests BOTTLES DRAWN AEROBIC AND ANAEROBIC 10CC  Final   Culture NO GROWTH 1 DAY  Final   Report Status PENDING  Incomplete  Culture, blood (routine x 2) Call MD if unable to obtain prior to antibiotics being given     Status: None (Preliminary result)   Collection Time: 04/27/15  6:54 AM  Result Value Ref Range Status   Specimen Description BLOOD RIGHT WRIST  Final   Special Requests BOTTLES DRAWN AEROBIC AND ANAEROBIC 5CC  Final   Culture NO GROWTH 1 DAY  Final   Report Status PENDING  Incomplete     Scheduled Meds: . amLODipine  5 mg Oral Daily  . atorvastatin  20 mg Oral q1800  . benzonatate  200 mg Oral 3 times per day  . clopidogrel  75 mg Oral Daily  . guaiFENesin  600 mg Oral BID  . heparin  5,000 Units Subcutaneous 3 times per day  . lanthanum  1,000 mg Oral BID WC  . metoprolol tartrate  25 mg Oral BID  . multivitamin  1 tablet Oral QHS  . pantoprazole  40 mg Oral Daily  . sodium chloride  3 mL Intravenous Q12H   Continuous Infusions:

## 2015-04-28 NOTE — Procedures (Signed)
I was present at this dialysis session. I have reviewed the session itself and made appropriate changes.   UF Goal 4L; pre weight well under EDW. BP stable on 2L [place].  Hb and Lytes ok.  B Cx NGTD.  Post weight should be EDW, probably should have further challenges at next Tx.  Afebrile normal WBC.  Doubt PNA.  Pearson Grippe  MD 04/28/2015, 9:36 AM

## 2015-04-28 NOTE — Clinical Documentation Improvement (Signed)
Please review the below and provide additional specificity for the documented diagnosis of anemia in the chart.   . Documentation of Anemia should include the type of anemia: --Nutritional --Hemolytic --Aplastic --In CKD --Due to blood loss --Other (please specify) . Include in documentation if Anemia is due to nutrition or mineral deficits, resulting in a nutritional anemia . Document if the Anemia is due to a neoplasm (primary and/or secondary) . Document whether the ANEMIA is "related to or due to" chemo or radiotherapy treatments . Document any "cause-and-effect" relationship between the intervention and the blood or immune disorder . Document the specific drug if anemia is drug-induced . Link any laboratory findings to a related diagnosis (if appropriate) . Document any associated diagnoses/conditions  Supporting Information: --ESRD on HD  Component      Hemoglobin HCT  Latest Ref Rng      13.0 - 17.0 g/dL 39.0 - 52.0 %  04/26/2015      11.0 (L) 35.3 (L)  04/27/2015      9.8 (L) 31.5 (L)  04/28/2015      10.9 (L) 34.8 (L)    Thank You, Hartley Barefoot ,RN Clinical Documentation Specialist:  [place] Information Management

## 2015-04-29 LAB — CBC
HEMATOCRIT: 37 % — AB (ref 39.0–52.0)
HEMOGLOBIN: 11.8 g/dL — AB (ref 13.0–17.0)
MCH: 28.6 pg (ref 26.0–34.0)
MCHC: 31.9 g/dL (ref 30.0–36.0)
MCV: 89.8 fL (ref 78.0–100.0)
Platelets: 134 10*3/uL — ABNORMAL LOW (ref 150–400)
RBC: 4.12 MIL/uL — AB (ref 4.22–5.81)
RDW: 16 % — ABNORMAL HIGH (ref 11.5–15.5)
WBC: 7.3 10*3/uL (ref 4.0–10.5)

## 2015-04-29 LAB — PROCALCITONIN: Procalcitonin: 1.27 ng/mL

## 2015-04-29 LAB — RENAL FUNCTION PANEL
Albumin: 3.2 g/dL — ABNORMAL LOW (ref 3.5–5.0)
Anion gap: 15 (ref 5–15)
BUN: 20 mg/dL (ref 6–20)
CO2: 29 mmol/L (ref 22–32)
Calcium: 10.2 mg/dL (ref 8.9–10.3)
Chloride: 92 mmol/L — ABNORMAL LOW (ref 101–111)
Creatinine, Ser: 5.22 mg/dL — ABNORMAL HIGH (ref 0.61–1.24)
GFR calc non Af Amer: 10 mL/min — ABNORMAL LOW (ref >60)
GFR, EST AFRICAN AMERICAN: 11 mL/min — AB (ref >60)
GLUCOSE: 94 mg/dL (ref 65–99)
Phosphorus: 4 mg/dL (ref 2.5–4.6)
Potassium: 3.9 mmol/L (ref 3.5–5.1)
SODIUM: 136 mmol/L (ref 135–145)

## 2015-04-29 NOTE — Discharge Instructions (Signed)
Acute Respiratory Failure °Respiratory failure is when your lungs are not working well and your breathing (respiratory) system fails. When respiratory failure occurs, it is difficult for your lungs to get enough oxygen, get rid of carbon dioxide, or both. Respiratory failure can be life threatening.  °Respiratory failure can be acute or chronic. Acute respiratory failure is sudden, severe, and requires emergency medical treatment. Chronic respiratory failure is less severe, happens over time, and requires ongoing treatment.  °WHAT ARE THE CAUSES OF ACUTE RESPIRATORY FAILURE?  °Any problem affecting the heart or lungs can cause acute respiratory failure. Some of these causes include the following: °· Chronic bronchitis and emphysema (COPD).   °· Blood clot going to a lung (pulmonary embolism).   °· Having water in the lungs caused by heart failure, lung injury, or infection (pulmonary edema).   °· Collapsed lung (pneumothorax).   °· Pneumonia.   °· Pulmonary fibrosis.   °· Obesity.   °· Asthma.   °· Heart failure.   °· Any type of trauma to the chest that can make breathing difficult.   °· Nerve or muscle diseases making chest movements difficult. °WHAT SYMPTOMS SHOULD YOU WATCH FOR?  °If you have any of these signs or symptoms, you should seek immediate medical care:  °· You have shortness of breath (dyspnea) with or without activity.   °· You have rapid, fast breathing (tachypnea).   °· You are wheezing. °· You are unable to say more than a few words without having to catch your breath. °· You find it very difficult to function normally. °· You have a fast heart rate.   °· You have a bluish color to your finger or toe nail beds.   °· You have confusion or drowsiness or both.   °HOW WILL MY ACUTE RESPIRATORY FAILURE BE TREATED?  °Treatment of acute respiratory failure depends on the cause of the respiratory failure. Usually, you will stay in the intensive care unit so your breathing can be watched closely. Treatment  can include the following: °· Oxygen. Oxygen can be delivered through the following: °¨ Nasal cannula. This is small tubing that goes in your nose to give you oxygen. °¨ Face mask. A face mask covers your nose and mouth to give you oxygen. °· Medicine. Different medicines can be given to help with breathing. These can include: °¨ Nebulizers. Nebulizers deliver medicines to open the air passages (bronchodilators). These medicines help to open or relax the airways in the lungs so you can breathe better. They can also help loosen mucus from your lungs. °¨ Diuretics. Diuretic medicines can help you breathe better by getting rid of extra water in your body. °¨ Steroids. Steroid medicines can help decrease swelling (inflammation) in your lungs. °¨ Antibiotics. °· Chest tube. If you have a collapsed lung (pneumothorax), a chest tube is placed to help reinflate the lung. °· Non-invasive positive pressure ventilation (NPPV). This is a tight-fitting mask that goes over your nose and mouth. The mask has tubing that is attached to a machine. The machine blows air into the tubing, which helps to keep the tiny air sacs (alveoli) in your lungs open. This machine allows you to breathe on your own. °· Ventilator. A ventilator is a breathing machine. When on a ventilator, a breathing tube is put into the lungs. A ventilator is used when you can no longer breathe well enough on your own. You may have low oxygen levels or high carbon dioxide (CO2) levels in your blood. When you are on a ventilator, sedation and pain medicines are given to make you sleep   so your lungs can heal. °Document Released: 09/27/2013 Document Revised: 02/06/2014 Document Reviewed: 09/27/2013 °[employer]® Patient Information ©2015 [employer]. This information is not intended to replace advice given to you by your health care provider. Make sure you discuss any questions you have with your health care provider. ° °

## 2015-04-29 NOTE — Discharge Summary (Signed)
Physician Discharge Summary  Tim Kirby MGQ:[id number] DOB: 1941/03/24 DOA: 04/26/2015  PCP: Sherril Croon, MD  Admit date: 04/26/2015 Discharge date: 04/29/2015  Recommendations for Outpatient Follow-up:  1. Pt will need to follow up with PCP in 2-3 weeks post discharge  Discharge Diagnoses:  Principal Problem:   Hypoxia Active Problems:   Essential hypertension   End stage renal disease   PVD (peripheral vascular disease)   Volume overload   Pulmonary infiltrate   Discharge Condition: Stable  Diet recommendation: renal diet   Brief narrative:    74 y.o. male with ESRD on hemodialysis Tuesday Thursday Saturday, essential hypertension, chronic anemia, peripheral vascular disease presented to [facility] ED with main concern of progressive shortness of breath with exertion and at rest.   Assessment/Plan:    Principal Problem:  Acute respiratory failure with hypoxia - secondary to pulmonary vascular congestion in the setting of ESRD - volume control with HD - respiratory status is stable this AM - HD per nephrology team - less likely PNA as pt is afebrile, no productive cough, WBC is stable WNL - d/c broad spectrum ABX prior to d/c  Active Problems:  Essential hypertension - pt is on metoprolol and norvasc   End stage renal disease on HD TTS - per nephrology team    HLD - continue statin    Anemia of chronic disease, ESRD - no signs of active bleeding   Thrombocytopenia  - close monitoring as pt is on Plavix - no signs of bleeding   Code Status: Full.  Family Communication: plan of care discussed with the patient Disposition Plan: Home  IV access:  Peripheral IV  Procedures and diagnostic studies:   Dg Chest 2 View 04/26/2015 Cardiomegaly. Central mild vascular congestion without convincing pulmonary edema. Bilateral small pleural effusion with bilateral basilar atelectasis or infiltrate.   Dg Chest 2 View 04/23/2015 Findings most  likely represent a degree of chronic congestive heart failure. Stable interstitial edema and cardiomegaly. No frank airspace consolidation.   Medical Consultants:  Nephrology   Other Consultants:  None  IAnti-Infectives:   Vanc and Fortaz stopped 7/23     Discharge Exam: Filed Vitals:   04/29/15 0433  BP: 94/74  Pulse: 109  Temp: 97.8 F (36.6 C)  Resp: 18   Filed Vitals:   04/28/15 1244 04/28/15 1407 04/28/15 2031 04/29/15 0433  BP: 96/59 104/71 99/77 94/74   Pulse: 119 116 57 109  Temp: 97.2 F (36.2 C) 97.3 F (36.3 C) 97.2 F (36.2 C) 97.8 F (36.6 C)  TempSrc: Oral Oral Oral Oral  Resp: 18 18 18 18   Height:      Weight:    74.9 kg (165 lb 2 oz)  SpO2: 91% 96% 98% 98%    General: Pt is alert, follows commands appropriately, not in acute distress Cardiovascular: Regular rhythm, tachycardic, no rubs, no gallops Respiratory: Clear to auscultation bilaterally, no wheezing, no crackles, no rhonchi Abdominal: Soft, non tender, non distended, bowel sounds +, no guarding Extremities: no cyanosis, pulses palpable bilaterally DP and PT  Discharge Instructions  Discharge Instructions    Diet - low sodium heart healthy    Complete by:  As directed      Increase activity slowly    Complete by:  As directed             Medication List    TAKE these medications        Albuterol Sulfate 108 (90 BASE) MCG/ACT Aepb  Inhale 2 puffs into  the lungs every 4 (four) hours as needed.     amLODipine 5 MG tablet  Commonly known as:  NORVASC  Take 5 mg by mouth daily.     benzonatate 200 MG capsule  Commonly known as:  TESSALON  Take 200 mg by mouth every 8 (eight) hours.     clopidogrel 75 MG tablet  Commonly known as:  PLAVIX  Take 1 tablet (75 mg total) by mouth daily.     lanthanum 1000 MG chewable tablet  Commonly known as:  FOSRENOL  Chew 1,000 mg by mouth 2 (two) times daily with a meal.     metoprolol tartrate 25 MG tablet  Commonly known as:   LOPRESSOR  Take 25 mg by mouth 2 (two) times daily.     multivitamin Tabs tablet  Take 1 tablet by mouth daily.     omeprazole 40 MG capsule  Commonly known as:  PRILOSEC  Take 40 mg by mouth daily.     simvastatin 40 MG tablet  Commonly known as:  ZOCOR  Take 40 mg by mouth every evening.            Follow-up Information    Follow up with Sherril Croon, MD.   Specialty:  Nephrology   Contact information:   [place] [address] [phone]       Call Faye Ramsay, MD.   Specialty:  Internal Medicine   Why:  As needed call my cell phone [phone]   Contact information:   [address] [place] [address] [phone]        The results of significant diagnostics from this hospitalization (including imaging, microbiology, ancillary and laboratory) are listed below for reference.     Microbiology: Recent Results (from the past 240 hour(s))  Culture, blood (routine x 2) Call MD if unable to obtain prior to antibiotics being given     Status: None (Preliminary result)   Collection Time: 04/27/15  6:48 AM  Result Value Ref Range Status   Specimen Description BLOOD LEFT ANTECUBITAL  Final   Special Requests BOTTLES DRAWN AEROBIC AND ANAEROBIC 10CC  Final   Culture NO GROWTH 1 DAY  Final   Report Status PENDING  Incomplete  Culture, blood (routine x 2) Call MD if unable to obtain prior to antibiotics being given     Status: None (Preliminary result)   Collection Time: 04/27/15  6:54 AM  Result Value Ref Range Status   Specimen Description BLOOD RIGHT WRIST  Final   Special Requests BOTTLES DRAWN AEROBIC AND ANAEROBIC 5CC  Final   Culture NO GROWTH 1 DAY  Final   Report Status PENDING  Incomplete     Labs: Basic Metabolic Panel:  Recent Labs Lab 04/23/15 0920 04/26/15 2046 04/27/15 0232 04/28/15 0255 04/29/15 0418  NA 141 141 143 136 136  K 3.7 3.4* 3.6 4.0 3.9  CL 97* 94* 96* 93* 92*  CO2 28 32 33* 31 29   GLUCOSE 114* 117* 102* 93 94  BUN 23* 13 18 15 20   CREATININE 8.64* 6.39* 6.85* 5.32* 5.22*  CALCIUM 9.8 10.0 9.6 9.9 10.2  PHOS  --   --   --   --  4.0   Liver Function Tests:  Recent Labs Lab 04/27/15 0232 04/29/15 0418  AST 34  --   ALT 14*  --   ALKPHOS 1478*  --   BILITOT 0.9  --   PROT 6.0*  --   ALBUMIN  3.1* 3.2*   No results for input(s): LIPASE, AMYLASE in the last 168 hours. No results for input(s): AMMONIA in the last 168 hours. CBC:  Recent Labs Lab 04/23/15 0920 04/26/15 2046 04/27/15 0232 04/28/15 0255 04/29/15 0418  WBC 7.2 6.8 6.3 6.6 7.3  HGB 11.7* 11.0* 9.8* 10.9* 11.8*  HCT 35.8* 35.3* 31.5* 34.8* 37.0*  MCV 89.3 90.3 90.8 90.2 89.8  PLT 120* 115* 104* 107* 134*   Cardiac Enzymes: No results for input(s): CKTOTAL, CKMB, CKMBINDEX, TROPONINI in the last 168 hours. BNP: BNP (last 3 results)  Recent Labs  04/26/15 2046  BNP 1518.4*    ProBNP (last 3 results) No results for input(s): PROBNP in the last 8760 hours.  CBG:  Recent Labs Lab 04/28/15 1617  GLUCAP 101*     SIGNED: Time coordinating discharge: 30 minutes  Faye Ramsay, MD  [facility] 04/29/2015, 10:37 AM Pager [phone]  If 7PM-7AM, please contact night-coverage [email or web address] Password [email or web address]

## 2015-04-29 NOTE — Progress Notes (Signed)
Admit: 04/26/2015 LOS: 2  22M admit with SOB / CHF exacerbation / Hypervolemia  Subjective:  HD yesterday,  2.1L UF, stopped early 2.2 tachycardia No post weight Breathing improved  07/23 0701 - 07/24 0700 In: 960 [P.O.:960] Out: 2178   Filed Weights   04/28/15 0439 04/28/15 0900 04/29/15 0433  Weight: 73.2 kg (161 lb 6 oz) 76.2 kg (167 lb 15.9 oz) 74.9 kg (165 lb 2 oz)    Scheduled Meds: . amLODipine  5 mg Oral Daily  . atorvastatin  20 mg Oral q1800  . benzonatate  200 mg Oral 3 times per day  . clopidogrel  75 mg Oral Daily  . guaiFENesin  600 mg Oral BID  . heparin  5,000 Units Subcutaneous 3 times per day  . lanthanum  1,000 mg Oral BID WC  . metoprolol tartrate  25 mg Oral BID  . multivitamin  1 tablet Oral QHS  . pantoprazole  40 mg Oral Daily  . sodium chloride  3 mL Intravenous Q12H   Continuous Infusions:  PRN Meds:.acetaminophen **OR** acetaminophen, ondansetron **OR** ondansetron (ZOFRAN) IV  Current Labs: reviewed    Physical Exam:  Blood pressure 94/74, pulse 109, temperature 97.8 F (36.6 C), temperature source Oral, resp. rate 18, height 5\' 8"  (1.727 m), weight 74.9 kg (165 lb 2 oz), SpO2 98 %. NAD CTAB RRR +B/T Nonfocal   Dialysis Orders: Center: [place] on MWF . EDW 78.5 HD Bath 2.0 K 2.0 ca 1 mg Time 4 hours Heparin 5000 units per TX. Access LFA AVF BFR 450 DFR [place] 1.5  Mircera 50 mcg IV q 2 weeks (last dose 04/08/15) Hectoral 5 mcg IV MWF   A/P 1. ESRD 1. MWF AKC via AVF 2. Using heparin 3. Keeping on schedule, should be ready for discharged today 2. Hypervolemia / SOB / CHF Exacerbation, acute  1. Set new EDW at 74.5kg 2. Improved with aggressive UF 3. B and Sputum Cx NGTD; doubt infectious 4. On amlopdine and MTP; might need to hold CCB  3. Anemia: stable on ESA 4. BMD: Cont outpt meds; watch Ca   Pearson Grippe MD 04/29/2015, 8:21 AM   Recent Labs Lab 04/27/15 0232 04/28/15 0255 04/29/15 0418  NA 143 136 136  K  3.6 4.0 3.9  CL 96* 93* 92*  CO2 33* 31 29  GLUCOSE 102* 93 94  BUN 18 15 20   CREATININE 6.85* 5.32* 5.22*  CALCIUM 9.6 9.9 10.2  PHOS  --   --  4.0    Recent Labs Lab 04/27/15 0232 04/28/15 0255 04/29/15 0418  WBC 6.3 6.6 7.3  HGB 9.8* 10.9* 11.8*  HCT 31.5* 34.8* 37.0*  MCV 90.8 90.2 89.8  PLT 104* 107* 134*

## 2015-05-02 LAB — CULTURE, BLOOD (ROUTINE X 2)
Culture: NO GROWTH
Culture: NO GROWTH

## 2015-05-04 ENCOUNTER — Emergency Department (HOSPITAL_COMMUNITY)
Admission: EM | Admit: 2015-05-04 | Discharge: 2015-05-05 | Disposition: A | Discharge status: Home / Self Care | Payer: Medicare Other | Attending: Emergency Medicine | Admitting: Emergency Medicine

## 2015-05-04 DIAGNOSIS — E785 Hyperlipidemia, unspecified: Secondary | ICD-10-CM | POA: Insufficient documentation

## 2015-05-04 DIAGNOSIS — Z7902 Long term (current) use of antithrombotics/antiplatelets: Secondary | ICD-10-CM | POA: Diagnosis not present

## 2015-05-04 DIAGNOSIS — I129 Hypertensive chronic kidney disease with stage 1 through stage 4 chronic kidney disease, or unspecified chronic kidney disease: Secondary | ICD-10-CM | POA: Insufficient documentation

## 2015-05-04 DIAGNOSIS — Z8709 Personal history of other diseases of the respiratory system: Secondary | ICD-10-CM | POA: Insufficient documentation

## 2015-05-04 DIAGNOSIS — I509 Heart failure, unspecified: Secondary | ICD-10-CM | POA: Diagnosis not present

## 2015-05-04 DIAGNOSIS — Z8719 Personal history of other diseases of the digestive system: Secondary | ICD-10-CM | POA: Insufficient documentation

## 2015-05-04 DIAGNOSIS — I499 Cardiac arrhythmia, unspecified: Secondary | ICD-10-CM | POA: Diagnosis not present

## 2015-05-04 DIAGNOSIS — Z87891 Personal history of nicotine dependence: Secondary | ICD-10-CM | POA: Insufficient documentation

## 2015-05-04 DIAGNOSIS — R079 Chest pain, unspecified: Secondary | ICD-10-CM | POA: Diagnosis not present

## 2015-05-04 DIAGNOSIS — M79605 Pain in left leg: Secondary | ICD-10-CM | POA: Diagnosis not present

## 2015-05-04 DIAGNOSIS — M79604 Pain in right leg: Secondary | ICD-10-CM | POA: Insufficient documentation

## 2015-05-04 DIAGNOSIS — N189 Chronic kidney disease, unspecified: Secondary | ICD-10-CM | POA: Insufficient documentation

## 2015-05-04 DIAGNOSIS — Z862 Personal history of diseases of the blood and blood-forming organs and certain disorders involving the immune mechanism: Secondary | ICD-10-CM | POA: Insufficient documentation

## 2015-05-04 DIAGNOSIS — Z79899 Other long term (current) drug therapy: Secondary | ICD-10-CM | POA: Insufficient documentation

## 2015-05-04 HISTORY — DX: Heart failure, unspecified: I50.9

## 2015-05-04 NOTE — ED Notes (Addendum)
Pt arrives from home via [place] c/o BLE pan starting in hips radiating to feet, worse in LLE.  Pt reports some chest pain, worse with coughing , states CP not as bad as last week. Pt denies n/v, SOB, dizziness.  Pt dced from this hospital 04/29/15 with new dx of CHF. Pt reports taking 1500 mg Tylenol at 2130.  Pt due for dialysis tomorrow.  Resp e/u, NAD noted at this time.

## 2015-05-05 ENCOUNTER — Encounter (HOSPITAL_COMMUNITY): Payer: Self-pay | Admitting: Emergency Medicine

## 2015-05-05 ENCOUNTER — Ambulatory Visit (HOSPITAL_COMMUNITY): Admission: RE | Admit: 2015-05-05 | Payer: Medicare Other | Source: Ambulatory Visit

## 2015-05-05 ENCOUNTER — Encounter (HOSPITAL_COMMUNITY): Payer: Medicare Other

## 2015-05-05 ENCOUNTER — Emergency Department (HOSPITAL_COMMUNITY): Payer: Medicare Other

## 2015-05-05 DIAGNOSIS — R079 Chest pain, unspecified: Secondary | ICD-10-CM | POA: Diagnosis not present

## 2015-05-05 LAB — I-STAT CHEM 8, ED
BUN: 31 mg/dL — ABNORMAL HIGH (ref 6–20)
CALCIUM ION: 1.13 mmol/L (ref 1.13–1.30)
CHLORIDE: 91 mmol/L — AB (ref 101–111)
CREATININE: 7.2 mg/dL — AB (ref 0.61–1.24)
Glucose, Bld: 101 mg/dL — ABNORMAL HIGH (ref 65–99)
HEMATOCRIT: 34 % — AB (ref 39.0–52.0)
Hemoglobin: 11.6 g/dL — ABNORMAL LOW (ref 13.0–17.0)
POTASSIUM: 3.6 mmol/L (ref 3.5–5.1)
Sodium: 135 mmol/L (ref 135–145)
TCO2: 27 mmol/L (ref 0–100)

## 2015-05-05 MED ORDER — OXYCODONE-ACETAMINOPHEN 5-325 MG PO TABS
1.0000 | ORAL_TABLET | Freq: Once | ORAL | Status: DC
  Filled 2015-05-05: qty 1

## 2015-05-05 MED ORDER — HYDROCODONE-ACETAMINOPHEN 5-325 MG PO TABS: 1.0000 | ORAL_TABLET | ORAL | Status: DC | PRN

## 2015-05-05 MED ORDER — OXYCODONE HCL 5 MG PO TABS
10.0000 mg | ORAL_TABLET | Freq: Once | ORAL | Status: AC
  Administered 2015-05-05: 10 mg via ORAL
  Filled 2015-05-05: qty 2

## 2015-05-05 NOTE — ED Provider Notes (Signed)
CSN: [id number]     Arrival date & time 05/04/15  2353 History  This chart was scribed for Jola Schmidt, MD by Chester Holstein, ED Scribe. This patient was seen in room B19C/B19C and the patient's care was started at 12:36 AM.      Chief Complaint  Patient presents with  . Leg Pain  . Chest Pain     The history is provided by the patient. No language interpreter was used.   HPI Comments: Tim Kirby is a 74 y.o. male brought in by ambulance, with PMHx of HLD, gout, secondary hyperparathyroidism, anemia, HTN, CKD on dialysis, aneurysm popliteal artery, and CHF who presents to the Emergency Department complaining of generalized bilateral leg pain and chest wall pain secondary to cough with onset 2 weeks ago worsening yesterday. 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  He states pain radiates into bilateral hips worse on the left. Pt reports leg pain at baseline. He notes increased pain with ambulation in last 2 days. He takes Tylenol for relief. He is on blood thinners. Pt was seen in ED on 04/26/15 with admission and evaluated for chest pain with dx of exacerbation of CHF. He reports improvement in chest pain. Pt denies SOB, numbness, nausea, vomiting, and diaphoresis.   Past Medical History  Diagnosis Date  . Hyperlipidemia   . Gout   . Secondary hyperparathyroidism   . Anemia   . Colon, diverticulosis   . Thyroid nodule   . Anginal pain     sees Dr. Lia Foyer  . Hypertension     sees Dr. Willy Eddy  . Chronic kidney disease     Tues, thurs, sat  . Aneurysm artery, popliteal   . Upper respiratory infection April 2016  . Renal insufficiency   . CHF (congestive heart failure)    Past Surgical History  Procedure Laterality Date  . Knee surgery      Left cyst removal by Dr. Lorin Mercy  . Av fistula placement      left UA AVF  . Femoral-popliteal bypass graft  10/18/2012    Procedure: BYPASS  GRAFT FEMORAL-POPLITEAL ARTERY;  Surgeon: Conrad Bronaugh, MD;  Location: [facility] OR;  Service: Vascular;  Laterality: Right;  Right Popliteal Aneurysm Exclusion; Ultrasound guided  . False aneurysm repair Left 12/29/2012    Procedure: REPAIR FALSE ANEURYSM;  Surgeon: Conrad Lone Oak, MD;  Location: [place];  Service: Vascular;  Laterality: Left;  . Endovascular repair of popliteal artery aneurysm Left 12/29/2012    Procedure: ENDOVASCULAR REPAIR OF POPLITEAL AND FEMORAL ARTERY ANEURYSM;  Surgeon: Conrad Cementon, MD;  Location: [place];  Service: Vascular;  Laterality: Left;  . Abdominal aortagram N/A 09/27/2012    Procedure: ABDOMINAL Maxcine Ham;  Surgeon: Angelia Mould, MD;  Location: [facility] CATH LAB;  Service: Cardiovascular;  Laterality: N/A;   Family History  Problem Relation Age of Onset  . Hypertension Mother   . Diabetes Mother   . Diabetes Brother    History  Substance Use Topics  . Smoking status: Former Smoker    Types: Cigarettes, Pipe    Quit date: 09/18/2007  . Smokeless tobacco: Never Used  . Alcohol Use: No    Review of Systems  Cardiovascular: Positive for chest pain.  All other systems reviewed and are negative.  A complete 10 system review of systems was obtained and all systems are negative except as noted in the HPI and  PMH.     Allergies  Review of patient's allergies indicates no known allergies.  Home Medications   Prior to Admission medications   Medication Sig Start Date End Date Taking? Authorizing Provider  Albuterol Sulfate 108 (90 BASE) MCG/ACT AEPB Inhale 2 puffs into the lungs every 4 (four) hours as needed.    Historical Provider, MD  amLODipine (NORVASC) 5 MG tablet Take 5 mg by mouth daily.  02/01/15   Historical Provider, MD  benzonatate (TESSALON) 200 MG capsule Take 200 mg by mouth every 8 (eight) hours. 04/21/15   Historical Provider, MD  clopidogrel (PLAVIX) 75 MG tablet Take 1 tablet (75 mg total) by mouth daily. 06/02/14   Conrad Jewell, MD  lanthanum  (FOSRENOL) 1000 MG chewable tablet Chew 1,000 mg by mouth 2 (two) times daily with a meal.    Historical Provider, MD  metoprolol tartrate (LOPRESSOR) 25 MG tablet Take 25 mg by mouth 2 (two) times daily.    Historical Provider, MD  multivitamin (RENA-VIT) TABS tablet Take 1 tablet by mouth daily.    Historical Provider, MD  omeprazole (PRILOSEC) 40 MG capsule Take 40 mg by mouth daily.    Historical Provider, MD  simvastatin (ZOCOR) 40 MG tablet Take 40 mg by mouth every evening.    Historical Provider, MD   BP 133/90 mmHg  Pulse 108  Temp(Src) 98.2 F (36.8 C) (Oral)  Resp 17  Ht 5\' 8"  (1.727 m)  Wt 163 lb 6 oz (74.106 kg)  BMI 24.85 kg/m2  SpO2 100% Physical Exam  Constitutional: He is oriented to person, place, and time. He appears well-developed and well-nourished.  HENT:  Head: Normocephalic and atraumatic.  Eyes: EOM are normal.  Neck: Normal range of motion.  Cardiovascular: Normal rate, normal heart sounds and intact distal pulses.  An irregularly irregular rhythm present.  Strong femoral pulses bilaterally dopplerable left popliteal  Pulmonary/Chest: Effort normal and breath sounds normal. No respiratory distress.  Abdominal: Soft. He exhibits no distension. There is no tenderness.  Musculoskeletal: Normal range of motion.  Patient palpable bilateral popliteal pulses.  Patient with pain dopplerable PT pulses bilaterally.  Unable to palpate or Doppler DP pulses bilaterally.  Feet are perfused.  Sensation in feet are normal.  Able to wiggle his toes bilaterally.  Neurological: He is alert and oriented to person, place, and time.  Skin: Skin is warm and dry.  Psychiatric: He has a normal mood and affect. Judgment normal.  Nursing note and vitals reviewed.   ED Course  Procedures (including critical care time) DIAGNOSTIC STUDIES: Oxygen Saturation is 100% on room air, normal by my interpretation.    COORDINATION OF CARE: 12:56 AM Discussed treatment plan with patient at  beside, the patient agrees with the plan and has no further questions at this time.   Labs Review Labs Reviewed - No data to display  Imaging Review No results found.   EKG Interpretation None      MDM   Final diagnoses:  None    Patient has severe chronic peripheral arterial disease after multiple arterial interventions.  Is complaining of increasing pain in his legs at this time and some this may be due to worsening arterial insufficiency however there are faint dopplerable PT pulses bilaterally.  There is conflicting information in the vascular surgery office notes as to what his baseline pulses are.  At this time I do not think the patient needs acute intervention as he has normal sensation and strength in his lower  extremities.  I've arranged for the patient to receive arterial duplexes first thing in the morning.  I have asked the patient to follow-up with his vascular surgeon for follow-up return to the ER for new or worsening symptoms.    I personally performed the services described in this documentation, which was scribed in my presence. The recorded information has been reviewed and is accurate.       Jola Schmidt, MD 05/05/15 [phone]

## 2015-05-05 NOTE — Discharge Instructions (Signed)
IMPORTANT PATIENT INSTRUCTIONS:  ° °You have been scheduled for an Outpatient Vascular Study at [facility].   ° °If tomorrow is a Saturday or Sunday, please go to the [facility] registration desk at 8 AM tomorrow morning and tell them you are therefore a vascular study. ° °If tomorrow is a weekday (Monday - Friday), please go to the  Admitting Department at 8 AM and tell them you are therefore a vascular study ° ° °

## 2015-05-07 ENCOUNTER — Telehealth: Payer: Self-pay

## 2015-05-07 DIAGNOSIS — Z95828 Presence of other vascular implants and grafts: Secondary | ICD-10-CM

## 2015-05-07 DIAGNOSIS — I739 Peripheral vascular disease, unspecified: Secondary | ICD-10-CM

## 2015-05-07 DIAGNOSIS — M79605 Pain in left leg: Secondary | ICD-10-CM

## 2015-05-07 DIAGNOSIS — M79604 Pain in right leg: Secondary | ICD-10-CM

## 2015-05-07 NOTE — Telephone Encounter (Signed)
Spoke to pt's wife to sch appts. ABI, LE Art on 05/10/15 at 9:30, and NP on 05/11/15 at 4:00.

## 2015-05-07 NOTE — Telephone Encounter (Signed)
Phone call from pt.  Reported he was in the ER 05/05/15 due to pain in bilateral legs.  Reported the pain is worse in the left LE; stated "the pain goes from my foot to my hip."  Also, c/o pain "in right LE from the knee and down to foot."  Denied any loss of sensation, any temperature change, or any open sores in bilat LE's.  Stated it is painful at rest, and with walking, but that walking makes it worse.  Had ABI's 02/02/15; Right LE: 0.88, Left LE: 1.2.   Last OV on 04/02/15; pt. c/o left hip pain and was referred to his PCP.  Stated he really doesn't have a PCP, but is working on getting one.  Reported the ER doctor advised him to reschedule appt. with Vascular surgeon.  Pt. stated he misplaced his prescription for pain medication, that was given in the ER.  Discussed pt's. symptoms with Dr. Kellie Simmering.  Advised to bring him in for Right LE Arterial Duplex to check the Bypass Graft, and ABI's.  Also stated to schedule him to see the NP.

## 2015-05-10 ENCOUNTER — Ambulatory Visit (INDEPENDENT_AMBULATORY_CARE_PROVIDER_SITE_OTHER)
Admission: RE | Admit: 2015-05-10 | Discharge: 2015-05-10 | Disposition: A | Discharge status: Home / Self Care | Payer: Medicare Other | Source: Ambulatory Visit | Attending: Vascular Surgery | Admitting: Vascular Surgery

## 2015-05-10 ENCOUNTER — Ambulatory Visit (HOSPITAL_COMMUNITY)
Admission: RE | Admit: 2015-05-10 | Discharge: 2015-05-10 | Disposition: A | Discharge status: Home / Self Care | Payer: Medicare Other | Source: Ambulatory Visit | Attending: Vascular Surgery | Admitting: Vascular Surgery

## 2015-05-10 ENCOUNTER — Encounter: Payer: Self-pay | Admitting: Family

## 2015-05-10 DIAGNOSIS — M79604 Pain in right leg: Secondary | ICD-10-CM

## 2015-05-10 DIAGNOSIS — M79605 Pain in left leg: Secondary | ICD-10-CM | POA: Insufficient documentation

## 2015-05-10 DIAGNOSIS — Z95828 Presence of other vascular implants and grafts: Secondary | ICD-10-CM

## 2015-05-10 DIAGNOSIS — Z9889 Other specified postprocedural states: Secondary | ICD-10-CM

## 2015-05-10 DIAGNOSIS — I739 Peripheral vascular disease, unspecified: Secondary | ICD-10-CM | POA: Insufficient documentation

## 2015-05-11 ENCOUNTER — Ambulatory Visit (INDEPENDENT_AMBULATORY_CARE_PROVIDER_SITE_OTHER): Payer: Medicare Other | Admitting: Family

## 2015-05-11 VITALS — BP 144/68 | HR 97 | Temp 99.0°F | Resp 18 | Ht 68.0 in | Wt 168.0 lb

## 2015-05-11 DIAGNOSIS — I723 Aneurysm of iliac artery: Secondary | ICD-10-CM | POA: Diagnosis not present

## 2015-05-11 DIAGNOSIS — Z95828 Presence of other vascular implants and grafts: Secondary | ICD-10-CM

## 2015-05-11 DIAGNOSIS — Z992 Dependence on renal dialysis: Secondary | ICD-10-CM

## 2015-05-11 DIAGNOSIS — N186 End stage renal disease: Secondary | ICD-10-CM | POA: Diagnosis not present

## 2015-05-11 DIAGNOSIS — M25552 Pain in left hip: Secondary | ICD-10-CM | POA: Diagnosis not present

## 2015-05-11 DIAGNOSIS — Z9889 Other specified postprocedural states: Secondary | ICD-10-CM | POA: Diagnosis not present

## 2015-05-11 NOTE — Progress Notes (Signed)
Filed Vitals:   05/11/15 1555 05/11/15 1557  BP: 154/70 144/68  Pulse: 90 97  Temp: 99 F (37.2 C)   Resp: 18   Height: 5\' 8"  (1.727 m)   Weight: 168 lb (76.204 kg)   SpO2: 97%

## 2015-05-11 NOTE — Progress Notes (Signed)
[facility] [place] HISTORY AND PHYSICAL -PAD  History of Present Illness Tim Kirby is a 74 y.o. male  patient of Dr. Bridgett Larsson who is s/p right femoral to below knee popliteal bypass graft with popliteal aneurysm ligation on 10/18/2012 and left popliteal artery stent for aneurysm on 12/29/2012. 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 Pt was last seen on 04/02/15. At that time he c/o intermittent left hip pain that started about the end of April 2016, pt denied injury, denied falling. The left hip pain is worse when bad weather approaches, is also worse in the morning for about 30 minutes and improves with walking. He denies non healing wounds in his lower extremities.  At that time he had audible pulse signals by Doppler in both feet, bilateral femoral pulses were palpable. His feet appeared well perfused with no evidence of ischemia. 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  He returns today with same complaint as 04/02/15 visit, also c/o joint pains. Pt states he was advised by the dialysis center to be evaluated in our office. He has not seen his PCP as advised at the his June 2016 visit, as he apparently did not have a PCP at that time, but has an appointment on 05/23/15 with a new PCP, Dr. Megan Salon near [place].   He denies non healing wounds in his lower extremities.   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  Pt denies any history of stroke or TIA..   The patient reports New Medical or Surgical History: hospitalized at [facility] March 2016 for respiratory infection.   His left forearm AVF is 43 years old per wife and working well for his hemodialysis.   Pt Diabetic: No Pt smoker:  former smoker, quit in 2008, a pipe, never smoked cigarettes  Pt meds include: Statin :Yes Betablocker: Yes ASA: Yes Other anticoagulants/antiplatelets: Plavix    Past Medical History  Diagnosis Date  . Hyperlipidemia   . Gout   . Secondary hyperparathyroidism   . Anemia   . Colon, diverticulosis   . Thyroid nodule   . Anginal pain     sees Dr. Lia Foyer  . Hypertension     sees Dr. Willy Eddy  . Chronic kidney disease     Tues, thurs, sat  . Aneurysm artery, popliteal   . Upper respiratory infection April 2016  . Renal insufficiency   . CHF (congestive heart failure)     Social History History  Substance Use Topics  . Smoking status: Former Smoker    Types: Cigarettes, Pipe    Quit date: 09/18/2007  . Smokeless tobacco: Never Used  . Alcohol Use: No    Family History Family History  Problem Relation Age of Onset  . Hypertension Mother   . Diabetes Mother   . Diabetes Brother     Past Surgical History  Procedure Laterality Date  . Knee surgery      Left cyst removal by Dr. Lorin Mercy  . Av fistula placement      left UA AVF  . Femoral-popliteal bypass graft  10/18/2012    Procedure: BYPASS GRAFT FEMORAL-POPLITEAL ARTERY;  Surgeon: Conrad Twin Lakes, MD;  Location:  [facility] OR;  Service: Vascular;  Laterality: Right;  Right Popliteal Aneurysm Exclusion; Ultrasound guided  . False aneurysm repair Left 12/29/2012    Procedure: REPAIR FALSE ANEURYSM;  Surgeon: Conrad Hildale, MD;  Location: [place];  Service: Vascular;  Laterality: Left;  . Endovascular repair of popliteal artery aneurysm Left 12/29/2012    Procedure: ENDOVASCULAR REPAIR OF POPLITEAL AND FEMORAL ARTERY ANEURYSM;  Surgeon: Conrad Fairmount, MD;  Location: [place];  Service: Vascular;  Laterality: Left;  . Abdominal aortagram N/A 09/27/2012    Procedure: ABDOMINAL Maxcine Ham;  Surgeon: Angelia Mould, MD;  Location: [facility] CATH LAB;  Service: Cardiovascular;  Laterality: N/A;    No Known Allergies  Current Outpatient  Prescriptions  Medication Sig Dispense Refill  . Albuterol Sulfate 108 (90 BASE) MCG/ACT AEPB Inhale 2 puffs into the lungs every 4 (four) hours as needed.    Marland Kitchen amLODipine (NORVASC) 5 MG tablet Take 5 mg by mouth daily.     . benzonatate (TESSALON) 200 MG capsule Take 200 mg by mouth every 8 (eight) hours.  0  . clopidogrel (PLAVIX) 75 MG tablet Take 1 tablet (75 mg total) by mouth daily. 90 tablet 3  . lanthanum (FOSRENOL) 1000 MG chewable tablet Chew 1,000 mg by mouth 2 (two) times daily with a meal.    . metoprolol tartrate (LOPRESSOR) 25 MG tablet Take 25 mg by mouth 2 (two) times daily.    . multivitamin (RENA-VIT) TABS tablet Take 1 tablet by mouth daily.    Marland Kitchen omeprazole (PRILOSEC) 40 MG capsule Take 40 mg by mouth daily.    . simvastatin (ZOCOR) 40 MG tablet Take 40 mg by mouth every evening.    Marland Kitchen HYDROcodone-acetaminophen (NORCO/VICODIN) 5-325 MG per tablet Take 1 tablet by mouth every 4 (four) hours as needed for moderate pain. (Patient not taking: Reported on 05/11/2015) 8 tablet 0   No current facility-administered medications for this visit.    ROS: See HPI for pertinent positives and negatives.   Physical Examination  Filed Vitals:   05/11/15 1555 05/11/15 1557  BP: 154/70 144/68  Pulse: 90 97  Temp: 99 F (37.2 C)   Resp: 18   Height: 5\' 8"  (1.727 m)   Weight: 168 lb (76.204 kg)   SpO2: 97%    Body mass index is 25.55 kg/(m^2).  General: A&O x 3, WDWN. Gait: normal Eyes: PERRLA. Pulmonary: CTAB, without wheezes , rales or rhonchi. Cardiac: regular Rythm , without detected murmur.     Carotid Bruits Right Left   Negative Negative  Aorta is not palpable. Radial pulses: right: 2+, Left: 1+  Left forearm AVF with palpable thrill.  VASCULAR EXAM: Extremities without ischemic changes, without Gangrene; without open wounds.      LE Pulses Right Left   FEMORAL 3+ palpable 2+ palpable    POPLITEAL not palpable  not palpable   POSTERIOR TIBIAL not palpable, audible by Doppler  not palpable, audible by Doppler    DORSALIS PEDIS  ANTERIOR TIBIAL not palpable, audible by Doppler  not palpable, not Dopplerable    PERONEAL Not palpable, audible by Doppler Not palpable, audible by Doppler   Abdomen: soft, NT, no palpable masses. Skin: no rashes, no ulcers. Musculoskeletal: no muscle wasting or atrophy. Neurologic: A&O X 3; Appropriate Affect, MOTOR FUNCTION: moving all extremities equally, motor strength 5/5 throughout, except 3/5 left LE. Speech is fluent/normal. CN 2-12 intact.                 Non-Invasive  Vascular Imaging: DATE: 05/11/2015 LOWER EXTREMITY ARTERIAL DUPLEX EVALUATION    INDICATION: Continued pain in legs.    PREVIOUS INTERVENTION(S): Right common femoral to below knee popliteal artery bypass graft for aneurysm 10/18/2012. Left popliteal stent for aneurysm 12/29/2012.    DUPLEX EXAM: Patient's next scheduled follow up was supposed to be in November.    RIGHT  LEFT   Peak Systolic Velocity (cm/s) Ratio (if abnormal) Waveform  Peak Systolic Velocity (cm/s) Ratio (if abnormal) Waveform  17 (3.86 diam)  M Inflow Artery     50  B Proximal Anastomosis     59  B Proximal Graft     69  B Mid Graft     83  B  Distal Graft     123  B Distal Anastomosis     127  B  Outflow Artery     0.85 Today's ABI / TBI 1.25 (likely calcified)  0.88 Previous ABI / TBI (02/02/2015  ) 1.2 (likely calcified)    Waveform:    M - Monophasic       B - Biphasic       T - Triphasic  If Ankle Brachial Index (ABI) or Toe Brachial Index (TBI) performed, please see complete report     ADDITIONAL FINDINGS: The right common femoral artery measures 3.86  cm in diameter.    IMPRESSION: Patent right femoral to below-knee popliteal artery bypass graft.    Compared to the previous exam:  No significant change in comparison to the last exam on 02/02/2015.     ASSESSMENT: Tim Kirby is a 74 y.o. male who is s/p right femoral to below knee popliteal bypass graft with popliteal aneurysm ligation on 10/18/2012 and left popliteal artery stent for aneurysm on 12/29/2012. 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 He has a left forearm AVF for HD access with a palpable thrill.  He returns today with c/o intermittent left hip pain that started about the end of April 2016, pt denies injury, denies falling; same complaint as on 04/02/15 visit. The left hip pain is worse when bad weather approaches, is also worse in the morning for about 30 minutes and improves with walking. He denies non healing wounds in his lower extremities.   His feet appear well perfused with no evidence of ischemia. Pt was advised at his 04/02/15 visit to see his PCP about his left hip pain that is classic for OA pain, but he did not as he had no PCP at that time but did not mention that he had no PCP. He has since obtained a PCP and has an appointment for 05/23/15.  Today's right LE arterial Duplex suggests a patent right femoral to below-knee popliteal artery bypass graft. The right common femoral artery measures 3.86 cm in diameter. Bilateral ABI's unchanged from 02/02/15.   PLAN:  Based on the patient's vascular studies and examination, pt will return to clinic as already scheduled on August 10, 2015 with BLE ABI, BLE arterial duplex, and aortoiliac duplex.  I discussed in depth with the patient the nature of atherosclerosis, and emphasized the importance of maximal medical management including strict control of blood pressure, blood glucose, and lipid levels, obtaining regular exercise, and continued cessation of smoking.  The patient is aware that without  maximal medical management the underlying atherosclerotic disease process will progress, limiting the benefit of any interventions.  The patient was given information about PAD including signs, symptoms, treatment, what symptoms should prompt  the patient to seek immediate medical care, and risk reduction measures to take.  Clemon Chambers, RN, MSN, FNP-C [facility] Phone: [phone]  Clinic MD: Bridgett Larsson  05/11/2015 4:18 PM

## 2015-05-12 ENCOUNTER — Encounter: Payer: Self-pay | Admitting: Family

## 2015-05-14 DIAGNOSIS — N186 End stage renal disease: Secondary | ICD-10-CM | POA: Insufficient documentation

## 2015-05-14 DIAGNOSIS — Z992 Dependence on renal dialysis: Secondary | ICD-10-CM

## 2015-05-14 DIAGNOSIS — I2510 Atherosclerotic heart disease of native coronary artery without angina pectoris: Secondary | ICD-10-CM | POA: Insufficient documentation

## 2015-05-14 DIAGNOSIS — E785 Hyperlipidemia, unspecified: Secondary | ICD-10-CM | POA: Insufficient documentation

## 2015-05-15 ENCOUNTER — Encounter: Payer: Self-pay | Admitting: Family

## 2015-05-15 DIAGNOSIS — I724 Aneurysm of artery of lower extremity: Secondary | ICD-10-CM | POA: Insufficient documentation

## 2015-05-17 ENCOUNTER — Telehealth: Payer: Self-pay | Admitting: *Deleted

## 2015-05-17 NOTE — Telephone Encounter (Signed)
LMTCB re:  Right common femoral artery aneurysm enlargement per Dr. Bridgett Larsson and Suzanne's note below.

## 2015-05-17 NOTE — Telephone Encounter (Signed)
-----   Message from Sharmon Leyden Nickel, NP sent at 05/15/2015 10:00 PM EDT ----- Regarding: FW: right CFA aneurysm is bigger Nicholaus Corolla, Would you please call pt and let him know that after taking another look at his test results, we need to take a closer look at the aneurysm in his right groin. Would you please order a cta abd/pelvis for next month.  Follow up after that with Dr. Bridgett Larsson. Indication is right femoral artery aneurysm which I added to his problem list.  Thank you, Vinnie Level   ----- Message -----    From: Conrad Williford, MD    Sent: 05/15/2015   7:19 AM      To: Sharmon Leyden Nickel, NP Subject: RE: right CFA aneurysm is bigger               cta abd/pelvis in next month.  Follow up after that  ----- Message -----    From: Viann Fish, NP    Sent: 05/12/2015   5:50 PM      To: Conrad Oxford, MD, Sharmon Leyden Nickel, NP Subject: right CFA aneurysm is bigger                   Dr. Bridgett Larsson,  I apologize that I did not recognize while the pt was here that his right CFA aneurysm is bigger, now above your threshold for consideration for repair. On 02/02/15 was 3.1 cm, at his visit on 05/11/15 was 3.86 cm. He did not c/o any right hip or leg pain; his complaint was left hip pain.  Should I ask pt to return to see you in the next week or two, or return as scheduled on 08/10/15?  Thanks, Trent Gabler is a 74 y.o. male who is s/p right femoral to below knee popliteal bypass graft with popliteal aneurysm ligation on 10/18/2012 and left popliteal artery stent for aneurysm on 12/29/2012. 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 He has a left forearm AVF for HD access with a palpable thrill.  He returns today with c/o intermittent left hip pain that started about the end of April 2016, pt denies injury, denies falling; same complaint as on 04/02/15 visit. The left hip pain is worse when bad weather approaches, is also worse in the morning for about 30 minutes and improves with  walking. He denies non healing wounds in his lower extremities.   His feet appear well perfused with no evidence of ischemia. Pt was advised at his 04/02/15 visit to see his PCP about his left hip pain that is classic for OA pain, but he did not as he had no PCP at that time but did not mention that he had no PCP. He has since obtained a PCP and has an appointment for 05/23/15.  Today's right LE arterial Duplex suggests a patent right femoral to below-knee popliteal artery bypass graft. The right common femoral artery measures 3.86 cm in diameter. Bilateral ABI's unchanged from 02/02/15.   PLAN:  Based on the patient's vascular studies and examination, pt will return to clinic as already scheduled on August 10, 2015 with BLE ABI, BLE arterial duplex, and aortoiliac duplex.

## 2015-05-22 ENCOUNTER — Other Ambulatory Visit: Payer: Self-pay | Admitting: *Deleted

## 2015-05-22 DIAGNOSIS — I724 Aneurysm of artery of lower extremity: Secondary | ICD-10-CM

## 2015-05-22 DIAGNOSIS — Z01810 Encounter for preprocedural cardiovascular examination: Secondary | ICD-10-CM

## 2015-05-24 ENCOUNTER — Telehealth: Payer: Self-pay | Admitting: Vascular Surgery

## 2015-05-24 NOTE — Telephone Encounter (Signed)
Left detailed msg for pt re appts, dpm

## 2015-05-24 NOTE — Telephone Encounter (Signed)
-----   Message from Sharmon Leyden Nickel, NP sent at 05/15/2015 10:00 PM EDT ----- Regarding: FW: right CFA aneurysm is bigger Tim Kirby, Would you please call pt and let him know that after taking another look at his test results, we need to take a closer look at the aneurysm in his right groin. Would you please order a cta abd/pelvis for next month.  Follow up after that with Dr. Bridgett Larsson. Indication is right femoral artery aneurysm which I added to his problem list.  Thank you, Vinnie Level   ----- Message -----    From: Conrad [place], MD    Sent: 05/15/2015   7:19 AM      To: Sharmon Leyden Nickel, NP Subject: RE: right CFA aneurysm is bigger               cta abd/pelvis in next month.  Follow up after that  ----- Message -----    From: Viann Fish, NP    Sent: 05/12/2015   5:50 PM      To: Conrad [place], MD, Sharmon Leyden Nickel, NP Subject: right CFA aneurysm is bigger                   Dr. Bridgett Larsson,  I apologize that I did not recognize while the pt was here that his right CFA aneurysm is bigger, now above your threshold for consideration for repair. On 02/02/15 was 3.1 cm, at his visit on 05/11/15 was 3.86 cm. He did not c/o any right hip or leg pain; his complaint was left hip pain.  Should I ask pt to return to see you in the next week or two, or return as scheduled on 08/10/15?  Thanks, Tim Kirby is a 74 y.o. male who is s/p right femoral to below knee popliteal bypass graft with popliteal aneurysm ligation on 10/18/2012 and left popliteal artery stent for aneurysm on 12/29/2012. 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 He has a left forearm AVF for HD access with a palpable thrill.  He returns today with c/o intermittent left hip pain that started about the end of April 2016, pt denies injury, denies falling; same complaint as on 04/02/15 visit. The left hip pain is worse when bad weather approaches, is also worse in the morning for about 30 minutes and improves with  walking. He denies non healing wounds in his lower extremities.   His feet appear well perfused with no evidence of ischemia. Pt was advised at his 04/02/15 visit to see his PCP about his left hip pain that is classic for OA pain, but he did not as he had no PCP at that time but did not mention that he had no PCP. He has since obtained a PCP and has an appointment for 05/23/15.  Today's right LE arterial Duplex suggests a patent right femoral to below-knee popliteal artery bypass graft. The right common femoral artery measures 3.86 cm in diameter. Bilateral ABI's unchanged from 02/02/15.   PLAN:  Based on the patient's vascular studies and examination, pt will return to clinic as already scheduled on August 10, 2015 with BLE ABI, BLE arterial duplex, and aortoiliac duplex.

## 2015-06-13 DIAGNOSIS — I480 Paroxysmal atrial fibrillation: Secondary | ICD-10-CM | POA: Insufficient documentation

## 2015-06-14 ENCOUNTER — Encounter: Payer: Self-pay | Admitting: Vascular Surgery

## 2015-06-14 ENCOUNTER — Inpatient Hospital Stay (HOSPITAL_COMMUNITY)
Admission: EM | Admit: 2015-06-14 | Discharge: 2015-06-17 | DRG: 542 | Disposition: A | Discharge status: Home / Self Care | Payer: Medicare Other | Attending: Family Medicine | Admitting: Family Medicine

## 2015-06-14 ENCOUNTER — Emergency Department (HOSPITAL_COMMUNITY): Payer: Medicare Other

## 2015-06-14 ENCOUNTER — Encounter (HOSPITAL_COMMUNITY): Payer: Self-pay | Admitting: Emergency Medicine

## 2015-06-14 DIAGNOSIS — R110 Nausea: Secondary | ICD-10-CM | POA: Diagnosis present

## 2015-06-14 DIAGNOSIS — I723 Aneurysm of iliac artery: Secondary | ICD-10-CM | POA: Diagnosis present

## 2015-06-14 DIAGNOSIS — Z23 Encounter for immunization: Secondary | ICD-10-CM

## 2015-06-14 DIAGNOSIS — I132 Hypertensive heart and chronic kidney disease with heart failure and with stage 5 chronic kidney disease, or end stage renal disease: Secondary | ICD-10-CM | POA: Diagnosis not present

## 2015-06-14 DIAGNOSIS — R627 Adult failure to thrive: Secondary | ICD-10-CM | POA: Diagnosis present

## 2015-06-14 DIAGNOSIS — K649 Unspecified hemorrhoids: Secondary | ICD-10-CM | POA: Diagnosis present

## 2015-06-14 DIAGNOSIS — Z79899 Other long term (current) drug therapy: Secondary | ICD-10-CM

## 2015-06-14 DIAGNOSIS — K5900 Constipation, unspecified: Secondary | ICD-10-CM | POA: Diagnosis present

## 2015-06-14 DIAGNOSIS — E785 Hyperlipidemia, unspecified: Secondary | ICD-10-CM | POA: Diagnosis present

## 2015-06-14 DIAGNOSIS — R410 Disorientation, unspecified: Secondary | ICD-10-CM | POA: Diagnosis present

## 2015-06-14 DIAGNOSIS — Z992 Dependence on renal dialysis: Secondary | ICD-10-CM

## 2015-06-14 DIAGNOSIS — Z7902 Long term (current) use of antithrombotics/antiplatelets: Secondary | ICD-10-CM

## 2015-06-14 DIAGNOSIS — I5032 Chronic diastolic (congestive) heart failure: Secondary | ICD-10-CM | POA: Diagnosis present

## 2015-06-14 DIAGNOSIS — N2581 Secondary hyperparathyroidism of renal origin: Secondary | ICD-10-CM | POA: Diagnosis present

## 2015-06-14 DIAGNOSIS — N186 End stage renal disease: Secondary | ICD-10-CM | POA: Diagnosis present

## 2015-06-14 DIAGNOSIS — R590 Localized enlarged lymph nodes: Secondary | ICD-10-CM | POA: Diagnosis present

## 2015-06-14 DIAGNOSIS — I120 Hypertensive chronic kidney disease with stage 5 chronic kidney disease or end stage renal disease: Secondary | ICD-10-CM | POA: Diagnosis present

## 2015-06-14 DIAGNOSIS — Z87891 Personal history of nicotine dependence: Secondary | ICD-10-CM

## 2015-06-14 DIAGNOSIS — M5440 Lumbago with sciatica, unspecified side: Secondary | ICD-10-CM | POA: Diagnosis present

## 2015-06-14 DIAGNOSIS — Z8249 Family history of ischemic heart disease and other diseases of the circulatory system: Secondary | ICD-10-CM

## 2015-06-14 DIAGNOSIS — R262 Difficulty in walking, not elsewhere classified: Secondary | ICD-10-CM | POA: Diagnosis present

## 2015-06-14 DIAGNOSIS — I2510 Atherosclerotic heart disease of native coronary artery without angina pectoris: Secondary | ICD-10-CM | POA: Diagnosis present

## 2015-06-14 DIAGNOSIS — R531 Weakness: Secondary | ICD-10-CM

## 2015-06-14 DIAGNOSIS — I724 Aneurysm of artery of lower extremity: Secondary | ICD-10-CM | POA: Diagnosis present

## 2015-06-14 DIAGNOSIS — R634 Abnormal weight loss: Secondary | ICD-10-CM | POA: Diagnosis present

## 2015-06-14 DIAGNOSIS — R52 Pain, unspecified: Secondary | ICD-10-CM | POA: Diagnosis not present

## 2015-06-14 DIAGNOSIS — R29898 Other symptoms and signs involving the musculoskeletal system: Secondary | ICD-10-CM

## 2015-06-14 DIAGNOSIS — I4891 Unspecified atrial fibrillation: Secondary | ICD-10-CM | POA: Diagnosis present

## 2015-06-14 DIAGNOSIS — Z8042 Family history of malignant neoplasm of prostate: Secondary | ICD-10-CM | POA: Diagnosis not present

## 2015-06-14 DIAGNOSIS — M109 Gout, unspecified: Secondary | ICD-10-CM | POA: Diagnosis present

## 2015-06-14 DIAGNOSIS — C799 Secondary malignant neoplasm of unspecified site: Secondary | ICD-10-CM

## 2015-06-14 DIAGNOSIS — C7951 Secondary malignant neoplasm of bone: Principal | ICD-10-CM | POA: Diagnosis present

## 2015-06-14 DIAGNOSIS — D638 Anemia in other chronic diseases classified elsewhere: Secondary | ICD-10-CM | POA: Diagnosis present

## 2015-06-14 DIAGNOSIS — M899 Disorder of bone, unspecified: Secondary | ICD-10-CM | POA: Diagnosis present

## 2015-06-14 DIAGNOSIS — I739 Peripheral vascular disease, unspecified: Secondary | ICD-10-CM | POA: Diagnosis present

## 2015-06-14 DIAGNOSIS — M545 Low back pain, unspecified: Secondary | ICD-10-CM | POA: Insufficient documentation

## 2015-06-14 DIAGNOSIS — C61 Malignant neoplasm of prostate: Secondary | ICD-10-CM | POA: Diagnosis present

## 2015-06-14 DIAGNOSIS — C641 Malignant neoplasm of right kidney, except renal pelvis: Secondary | ICD-10-CM | POA: Diagnosis not present

## 2015-06-14 DIAGNOSIS — I10 Essential (primary) hypertension: Secondary | ICD-10-CM | POA: Diagnosis not present

## 2015-06-14 HISTORY — DX: Dependence on renal dialysis: Z99.2

## 2015-06-14 HISTORY — DX: End stage renal disease: N18.6

## 2015-06-14 LAB — COMPREHENSIVE METABOLIC PANEL
ALT: 18 U/L (ref 17–63)
AST: 34 U/L (ref 15–41)
Albumin: 3 g/dL — ABNORMAL LOW (ref 3.5–5.0)
Alkaline Phosphatase: 1072 U/L — ABNORMAL HIGH (ref 38–126)
Anion gap: 14 (ref 5–15)
BUN: 46 mg/dL — AB (ref 6–20)
CALCIUM: 9.5 mg/dL (ref 8.9–10.3)
CHLORIDE: 98 mmol/L — AB (ref 101–111)
CO2: 27 mmol/L (ref 22–32)
CREATININE: 8.08 mg/dL — AB (ref 0.61–1.24)
GFR calc Af Amer: 7 mL/min — ABNORMAL LOW (ref >60)
GFR calc non Af Amer: 6 mL/min — ABNORMAL LOW (ref >60)
GLUCOSE: 106 mg/dL — AB (ref 65–99)
Potassium: 4.6 mmol/L (ref 3.5–5.1)
SODIUM: 139 mmol/L (ref 135–145)
Total Bilirubin: 0.7 mg/dL (ref 0.3–1.2)
Total Protein: 5.9 g/dL — ABNORMAL LOW (ref 6.5–8.1)

## 2015-06-14 LAB — CBC WITH DIFFERENTIAL/PLATELET
BAND NEUTROPHILS: 6 % (ref 0–10)
BASOS PCT: 3 % — AB (ref 0–1)
Basophils Absolute: 0.2 10*3/uL — ABNORMAL HIGH (ref 0.0–0.1)
Blasts: 0 %
EOS ABS: 0 10*3/uL (ref 0.0–0.7)
EOS PCT: 0 % (ref 0–5)
HCT: 29.2 % — ABNORMAL LOW (ref 39.0–52.0)
Hemoglobin: 9.1 g/dL — ABNORMAL LOW (ref 13.0–17.0)
LYMPHS ABS: 2.7 10*3/uL (ref 0.7–4.0)
Lymphocytes Relative: 37 % (ref 12–46)
MCH: 29.4 pg (ref 26.0–34.0)
MCHC: 31.2 g/dL (ref 30.0–36.0)
MCV: 94.5 fL (ref 78.0–100.0)
METAMYELOCYTES PCT: 2 %
MONO ABS: 0.1 10*3/uL (ref 0.1–1.0)
MYELOCYTES: 1 %
Monocytes Relative: 2 % — ABNORMAL LOW (ref 3–12)
NEUTROS PCT: 49 % (ref 43–77)
Neutro Abs: 4.2 10*3/uL (ref 1.7–7.7)
Other: 0 %
PLATELETS: 104 10*3/uL — AB (ref 150–400)
Promyelocytes Absolute: 0 %
RBC: 3.09 MIL/uL — ABNORMAL LOW (ref 4.22–5.81)
RDW: 20.7 % — AB (ref 11.5–15.5)
WBC: 7.2 10*3/uL (ref 4.0–10.5)
nRBC: 16 /100 WBC — ABNORMAL HIGH

## 2015-06-14 LAB — PSA: PSA: 494 ng/mL — ABNORMAL HIGH (ref 0.00–4.00)

## 2015-06-14 MED ORDER — CLOPIDOGREL BISULFATE 75 MG PO TABS
75.0000 mg | ORAL_TABLET | Freq: Every day | ORAL | Status: DC
  Administered 2015-06-14 – 2015-06-17 (×4): 75 mg via ORAL
  Filled 2015-06-14 (×4): qty 1

## 2015-06-14 MED ORDER — ALBUTEROL SULFATE 108 (90 BASE) MCG/ACT IN AEPB: 2.0000 | INHALATION_SPRAY | RESPIRATORY_TRACT | Status: DC | PRN

## 2015-06-14 MED ORDER — GABAPENTIN 100 MG PO CAPS
100.0000 mg | ORAL_CAPSULE | Freq: Two times a day (BID) | ORAL | Status: DC
  Administered 2015-06-14 – 2015-06-17 (×6): 100 mg via ORAL
  Filled 2015-06-14 (×6): qty 1

## 2015-06-14 MED ORDER — SIMVASTATIN 40 MG PO TABS
40.0000 mg | ORAL_TABLET | Freq: Every evening | ORAL | Status: DC
Start: 2015-06-14 — End: 2015-06-15
  Administered 2015-06-14: 40 mg via ORAL
  Filled 2015-06-14: qty 1

## 2015-06-14 MED ORDER — MORPHINE SULFATE (PF) 4 MG/ML IV SOLN
4.0000 mg | Freq: Once | INTRAVENOUS | Status: AC
  Administered 2015-06-14: 4 mg via INTRAVENOUS
  Filled 2015-06-14: qty 1

## 2015-06-14 MED ORDER — ONDANSETRON HCL 4 MG PO TABS: 4.0000 mg | ORAL_TABLET | Freq: Four times a day (QID) | ORAL | Status: DC | PRN

## 2015-06-14 MED ORDER — PANTOPRAZOLE SODIUM 40 MG PO TBEC
40.0000 mg | DELAYED_RELEASE_TABLET | Freq: Every day | ORAL | Status: DC
  Administered 2015-06-14 – 2015-06-17 (×4): 40 mg via ORAL
  Filled 2015-06-14 (×4): qty 1

## 2015-06-14 MED ORDER — HEPARIN SODIUM (PORCINE) 5000 UNIT/ML IJ SOLN
5000.0000 [IU] | Freq: Three times a day (TID) | INTRAMUSCULAR | Status: DC
  Administered 2015-06-14 – 2015-06-17 (×7): 5000 [IU] via SUBCUTANEOUS
  Filled 2015-06-14 (×7): qty 1

## 2015-06-14 MED ORDER — SODIUM CHLORIDE 0.9 % IJ SOLN: 3.0000 mL | INTRAMUSCULAR | Status: DC | PRN

## 2015-06-14 MED ORDER — SODIUM CHLORIDE 0.9 % IJ SOLN
3.0000 mL | Freq: Two times a day (BID) | INTRAMUSCULAR | Status: DC
Start: 2015-06-14 — End: 2015-06-17
  Administered 2015-06-14 – 2015-06-16 (×4): 3 mL via INTRAVENOUS

## 2015-06-14 MED ORDER — POLYETHYLENE GLYCOL 3350 17 G PO PACK: 17.0000 g | PACK | Freq: Every day | ORAL | Status: DC | PRN

## 2015-06-14 MED ORDER — DOCUSATE SODIUM 100 MG PO CAPS
100.0000 mg | ORAL_CAPSULE | Freq: Two times a day (BID) | ORAL | Status: DC
  Administered 2015-06-14 – 2015-06-15 (×2): 100 mg via ORAL
  Filled 2015-06-14 (×2): qty 1

## 2015-06-14 MED ORDER — ONDANSETRON HCL 4 MG/2ML IJ SOLN: 4.0000 mg | Freq: Four times a day (QID) | INTRAMUSCULAR | Status: DC | PRN

## 2015-06-14 MED ORDER — AMLODIPINE BESYLATE 5 MG PO TABS
5.0000 mg | ORAL_TABLET | Freq: Every day | ORAL | Status: DC
  Administered 2015-06-14 – 2015-06-17 (×4): 5 mg via ORAL
  Filled 2015-06-14 (×4): qty 1

## 2015-06-14 MED ORDER — MORPHINE SULFATE (PF) 4 MG/ML IV SOLN
4.0000 mg | INTRAVENOUS | Status: DC | PRN
  Administered 2015-06-14 – 2015-06-15 (×3): 4 mg via INTRAVENOUS
  Filled 2015-06-14 (×3): qty 1

## 2015-06-14 MED ORDER — METOPROLOL TARTRATE 25 MG PO TABS
25.0000 mg | ORAL_TABLET | Freq: Two times a day (BID) | ORAL | Status: DC
  Administered 2015-06-14 – 2015-06-17 (×6): 25 mg via ORAL
  Filled 2015-06-14 (×6): qty 1

## 2015-06-14 MED ORDER — SODIUM CHLORIDE 0.9 % IV SOLN: 250.0000 mL | INTRAVENOUS | Status: DC | PRN

## 2015-06-14 MED ORDER — LANTHANUM CARBONATE 500 MG PO CHEW
1000.0000 mg | CHEWABLE_TABLET | Freq: Two times a day (BID) | ORAL | Status: DC
  Administered 2015-06-15 – 2015-06-17 (×5): 1000 mg via ORAL
  Filled 2015-06-14 (×5): qty 2

## 2015-06-14 MED ORDER — SODIUM CHLORIDE 0.9 % IJ SOLN
3.0000 mL | Freq: Two times a day (BID) | INTRAMUSCULAR | Status: DC
Start: 2015-06-14 — End: 2015-06-17
  Administered 2015-06-15 – 2015-06-16 (×3): 3 mL via INTRAVENOUS

## 2015-06-14 MED ORDER — ALBUTEROL SULFATE (2.5 MG/3ML) 0.083% IN NEBU: 2.5000 mg | INHALATION_SOLUTION | RESPIRATORY_TRACT | Status: DC | PRN

## 2015-06-14 NOTE — Progress Notes (Signed)
COURTESY NOTE: Met with this 74 y/o [place] man, his wife and brother. He has been admitted with poorly controlled pain and MRI of the lumbar spine shows paraaortic nodes and blastic bone lesions c/w prostate cancer. The patient only recently met his PCP. There has been no prostate screening. PSA has been ordered, but not yet drawn.  I oriented the patient and family to prostate cancer. This cancer, if spread to bone, is not curable but can be very treatable, and the backbone of treatment is antitestoterone therapy. Treatment is all out patient and our prostate cancer specialist is Dr Alen Blew.  If the PSA is elevated I will arrange for consultation with Dr Alen Blew. In any case the patient will need biopsy of one of the bone lesions, which can be done through IR this admission.  Will follow with you until additional data available.

## 2015-06-14 NOTE — ED Notes (Addendum)
Pt complaining of left arm pain around back to leg. Shingles around flank and back have healed but reports still having pain. Dialysis pt supposed to be there now. Hemmriods. Multiple medical issues- wife says concerned about liver. But pt today is here for hemorrhoids and pain.

## 2015-06-14 NOTE — ED Provider Notes (Addendum)
CSN: [id number]     Arrival date & time 06/14/15  1044 History   First MD Initiated Contact with Patient 06/14/15 1137     Chief Complaint  Patient presents with  . Leg Pain  . Arm Pain  . Hemorrhoids     (Consider location/radiation/quality/duration/timing/severity/associated sxs/prior Treatment) HPI Patient has been having difficulty with pain for approximately a month. He is having lower back pain that radiates down both legs. He reports his doctor thought this was sciatica and he was prescribed a medication but it has not helped. At baseline the patient uses a walker or a cane. Functionally there has been increased difficulty in walking, particularly over the past several days. He also reports he's had some very severe hemorrhoids lately and his wife states that she's been trying to apply Preparation H and certain powders but he is not getting any improvement. He felt it would be very difficult for him to sit through dialysis today due to his hemorrhoids and back pain. Due to physical mobility issues the patient is unable to use a sitz bath as part of his treatment. His wife reports he's been complaining more of low back pain but this morning he reports that he had pain radiating from the back of his neck into his left arm. He reports is aching and sharp in nature. Worse with certain positions.  PCP: dr. Regino Schultze Past Medical History  Diagnosis Date  . Hyperlipidemia   . Gout   . Secondary hyperparathyroidism   . Anemia   . Colon, diverticulosis   . Thyroid nodule   . Anginal pain     sees Dr. Lia Foyer  . Hypertension     sees Dr. Willy Eddy  . Chronic kidney disease     Tues, thurs, sat  . Aneurysm artery, popliteal   . Upper respiratory infection April 2016  . Renal insufficiency   . CHF (congestive heart failure)   . Aneurysm of right femoral artery    Past Surgical History  Procedure Laterality Date  . Knee surgery      Left cyst removal by Dr. Lorin Mercy  . Av fistula  placement      left UA AVF  . Femoral-popliteal bypass graft  10/18/2012    Procedure: BYPASS GRAFT FEMORAL-POPLITEAL ARTERY;  Surgeon: Conrad Bussey, MD;  Location: [facility];  Service: Vascular;  Laterality: Right;  Right Popliteal Aneurysm Exclusion; Ultrasound guided  . False aneurysm repair Left 12/29/2012    Procedure: REPAIR FALSE ANEURYSM;  Surgeon: Conrad Pomeroy, MD;  Location: [place];  Service: Vascular;  Laterality: Left;  . Endovascular repair of popliteal artery aneurysm Left 12/29/2012    Procedure: ENDOVASCULAR REPAIR OF POPLITEAL AND FEMORAL ARTERY ANEURYSM;  Surgeon: Conrad Huntley, MD;  Location: [place];  Service: Vascular;  Laterality: Left;  . Abdominal aortagram N/A 09/27/2012    Procedure: ABDOMINAL Maxcine Ham;  Surgeon: Angelia Mould, MD;  Location: [facility] CATH LAB;  Service: Cardiovascular;  Laterality: N/A;   Family History  Problem Relation Age of Onset  . Hypertension Mother   . Diabetes Mother   . Diabetes Brother    Social History  Substance Use Topics  . Smoking status: Former Smoker    Types: Cigarettes, Pipe    Quit date: 09/18/2007  . Smokeless tobacco: Never Used  . Alcohol Use: No    Review of Systems 10 Systems reviewed and are negative for acute change except as noted in the HPI.    Allergies  Review of patient's allergies indicates no known allergies.  Home Medications   Prior to Admission medications   Medication Sig Start Date End Date Taking? Authorizing Provider  Albuterol Sulfate 108 (90 BASE) MCG/ACT AEPB Inhale 2 puffs into the lungs every 4 (four) hours as needed.   Yes Historical Provider, MD  amLODipine (NORVASC) 5 MG tablet Take 5 mg by mouth daily.  02/01/15  Yes Historical Provider, MD  clopidogrel (PLAVIX) 75 MG tablet Take 1 tablet (75 mg total) by mouth daily. 06/02/14  Yes Conrad Cole, MD  lanthanum (FOSRENOL) 1000 MG chewable tablet Chew 1,000 mg by mouth 2 (two) times daily with a meal.   Yes Historical Provider, MD  metoprolol  tartrate (LOPRESSOR) 25 MG tablet Take 25 mg by mouth 2 (two) times daily.   Yes Historical Provider, MD  multivitamin (RENA-VIT) TABS tablet Take 1 tablet by mouth daily.   Yes Historical Provider, MD  omeprazole (PRILOSEC) 40 MG capsule Take 40 mg by mouth daily.   Yes Historical Provider, MD  simvastatin (ZOCOR) 40 MG tablet Take 40 mg by mouth every evening.   Yes Historical Provider, MD  benzonatate (TESSALON) 200 MG capsule Take 200 mg by mouth every 8 (eight) hours. 04/21/15   Historical Provider, MD   BP 146/91 mmHg  Pulse 87  Temp(Src) 98.1 F (36.7 C) (Oral)  Resp 16  Ht 5\' 8"  (1.727 m)  Wt 165 lb (74.844 kg)  BMI 25.09 kg/m2  SpO2 97% Physical Exam  Constitutional: He is oriented to person, place, and time. He appears well-developed and well-nourished.  Patient appears to be in moderate pain but is alert and appropriate. No respiratory distress and nontoxic.  HENT:  Head: Normocephalic and atraumatic.  Eyes: EOM are normal. Pupils are equal, round, and reactive to light.  Neck: Neck supple.  Cardiovascular: Normal rate and intact distal pulses.   Irregularly irregular. Patient has fistula in the left forearm.  Pulmonary/Chest: Effort normal and breath sounds normal.  Abdominal: Soft. Bowel sounds are normal. He exhibits no distension. There is no tenderness.  Genitourinary:  Anus has 2 large hemorrhoidal protrusions. These are linearly oriented with an approximately 2 cm longitudinal hemorrhoid from the left 12:00 to 6:00 position. Slightly smaller approximately 1 cm hemorrhoid oriented on the right. With digital examination patient is in significant pain. Entry into the anal canal shows the canal to be boggy and engorged in its circumference. Surfaces are irregular.  Musculoskeletal: He exhibits no tenderness. Edema: trace bilateral lower extremity edema.  Lumbar spine tenderness palpation. There is a healing scarred patch of zoster without active lesions on the thoracic  posterior chest wall.  Neurological: He is alert and oriented to person, place, and time. He has normal strength. No cranial nerve deficit. Coordination normal. GCS eye subscore is 4. GCS verbal subscore is 5. GCS motor subscore is 6.  Patient is able to elevate each leg off of the bed independently but is weak and has difficulty maintaining elevation. Plantar flexion and extension is intact. Sensation is intact to light touch.  Skin: Skin is warm, dry and intact.  Psychiatric: He has a normal mood and affect.    ED Course  Procedures (including critical care time) Labs Review Labs Reviewed  COMPREHENSIVE METABOLIC PANEL - Abnormal; Notable for the following:    Chloride 98 (*)    Glucose, Bld 106 (*)    BUN 46 (*)    Creatinine, Ser 8.08 (*)    Total Protein 5.9 (*)  Albumin 3.0 (*)    Alkaline Phosphatase 1072 (*)    GFR calc non Af Amer 6 (*)    GFR calc Af Amer 7 (*)    All other components within normal limits  CBC WITH DIFFERENTIAL/PLATELET - Abnormal; Notable for the following:    RBC 3.09 (*)    Hemoglobin 9.1 (*)    HCT 29.2 (*)    RDW 20.7 (*)    Platelets 104 (*)    Monocytes Relative 2 (*)    Basophils Relative 3 (*)    nRBC 16 (*)    Basophils Absolute 0.2 (*)    All other components within normal limits    Imaging Review Mr Lumbar Spine Wo Contrast  06/14/2015   CLINICAL DATA:  Back pain with radiculopathy and gait dysfunction.  EXAM: MRI LUMBAR SPINE WITHOUT CONTRAST  TECHNIQUE: Multiplanar, multisequence MR imaging of the lumbar spine was performed. No intravenous contrast was administered.  COMPARISON:  None.  FINDINGS: Transitional lumbosacral anatomy with rudimentary S1-S2 disc space.  Diffusely abnormal appearance of the marrow with confluent hypo intensity on T1 weighted imaging. Given chest CT findings 04/23/2015 and bulky pelvic and lower retroperitoneal adenopathy, prostate cancer is strongly favored. No definitive extra osseous tumor to explain symptoms.  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  Hyperintensity at the level of the sacral ala and body is likely from field in homogeneity as on axial imaging there is no discrete fracture line or edema. Presacral edema which is nonspecific.  Normal conus signal and morphology.  Bilateral polycystic kidneys with atrophy, dialysis associated pattern.  Degenerative changes:  T12- L1: Unremarkable.  L1-L2: Unremarkable.  L2-L3: Mild spondylosis. Mild facet and ligament overgrowth. No impingement.  L3-L4: Mild disc bulging in the biforaminal regions. Facet and ligament overgrowth. No impingement.  L4-L5: Facet and ligament overgrowth which is moderate, with joint effusion on the right. Disc bulging, greater to the left. Mild to moderate biforaminal stenosis.  L5-S1:Disc narrowing with circumferential bulging. Bulky facet arthropathy with joint effusions. Biforaminal stenosis and L5 impingement. Subarticular recess effacement without discrete impingement.  IMPRESSION: 1. Diffuse osteoblastic metastatic disease with pelvic and periaortic adenopathy. The pattern suggests metastatic prostate cancer. 2. Advanced lower lumbar facet arthropathy with L5-S1 biforaminal stenosis and L5 impingement. 3.  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   Electronically Signed   By: Monte Fantasia M.D.   On: 06/14/2015 14:51   I have personally reviewed and evaluated these images and lab results as part of my medical decision-making.   EKG Interpretation   Date/Time:  Thursday June 14 2015 11:11:12 EDT Ventricular Rate:  73 PR Interval:    QRS Duration: 80 QT Interval:  398 QTC Calculation: 438 R Axis:   7 Text Interpretation:  Atrial fibrillation Nonspecific ST and T wave  abnormality Abnormal ECG agree.no sig change Confirmed by Johnney Killian, MD,  Jeannie Done [phone]) on 06/14/2015 2:26:55 PM      MDM   Final diagnoses:  Midline low back pain, with sciatica presence  unspecified  Metastatic cancer  ESRD (end stage renal disease)   Patient has complex medical history with multiple comorbidities. Presenting complaint was for increasing back pain not controlled by medications prescribed by primary physician with increasing gait dysfunction. MRI has identified lesions that appear metastatic from a suspected prostate origin based on radiological interpretation. At this time the patient will be admitted with intractable pain and gait dysfunction likely secondary to  metastatic cancer. As well, complicated by end-stage renal disease on dialysis.    Charlesetta Shanks, MD 06/14/15 [id number]  Charlesetta Shanks, MD 06/14/15 [phone]

## 2015-06-14 NOTE — ED Notes (Signed)
Doctor @ bedside

## 2015-06-14 NOTE — ED Notes (Signed)
Report attempted x2

## 2015-06-14 NOTE — ED Notes (Signed)
Patient still at MRI

## 2015-06-14 NOTE — ED Notes (Signed)
MD at bedside. 

## 2015-06-14 NOTE — ED Notes (Signed)
Report attempted 

## 2015-06-14 NOTE — Progress Notes (Signed)
Tim Kirby [id number] Admitted to 3T34: 06/14/2015 9:00 PM Attending Provider: Zenia Resides, MD    Tim Kirby is a 74 y.o. male patient admitted from ED awake, alert  & orientated  X 3,  Prior, VSS - Blood pressure 136/86, pulse 84, temperature 97.8 F (36.6 C), temperature source Oral, resp. rate 22, height 5\' 8"  (1.727 m), weight 74.844 kg (165 lb), SpO2 98 %., R/A, with some shortness of breath, no c/o chest pain, no distress noted. Tele # 7 placed and pt is currently running:atrial fibrillation, with ventricular rate of 93.   IV site WDL:  antecubital right, condition patent and no redness with a transparent dsg that's clean dry and intact.  Allergies:  No Known Allergies   Past Medical History  Diagnosis Date  . Hyperlipidemia   . Gout   . Secondary hyperparathyroidism   . Anemia   . Colon, diverticulosis   . Thyroid nodule   . Anginal pain     sees Dr. Lia Foyer  . Hypertension     sees Dr. Willy Eddy  . Chronic kidney disease     Tues, thurs, sat  . Aneurysm artery, popliteal   . Upper respiratory infection April 2016  . Renal insufficiency   . CHF (congestive heart failure)   . Aneurysm of right femoral artery     History:  Will be obtained from the patient, by admission nurse.  Pt orientation to unit, room and routine. Information packet given to patient/family and safety video watched.  Admission INP armband ID verified with patient/family, and in place. SR up x 2, fall risk assessment complete with Patient and family verbalizing understanding of risks associated with falls. Pt verbalizes an understanding of how to use the call bell and to call for help before getting out of bed.  Skin, clean-dry- intact without evidence of bruising, or skin tears.   No evidence of skin break down noted on exam.    Will cont to monitor and assist as needed.  Wynetta Emery, Jeanne Diefendorf C, RN 06/14/2015 9:00 PM

## 2015-06-14 NOTE — ED Notes (Signed)
Report attempted x3

## 2015-06-14 NOTE — H&P (Signed)
[facility] Admission History and Physical Service Pager: [phone]  [name] Medical record number: [id number] Date of birth: 08-31-1941 Age: 74 y.o. Gender: male  Primary Care Provider: Sherril Croon, MD Jenean Lindau) Consultants: Oncology, Nephrology Code Status: FULL  Chief Complaint: Back pain, bilateral leg weakness  Assessment and Plan: [name] is a 74 y.o. male presenting with back pain and bilateral leg weakness for the last 3 months. MRI showing diffuse metastatic disease, thought to be prostate in origin. Admitted for pain control. PMH is significant for HTN, ESRD on dialysis, PVD, HLD, CHF (last ECHO 3/30: EF 55%, moderate LVH, moderate diastolic dysfunction), secondary hyperparathyroidism, anemia, gout.  1. Back pain with osteoblastic lesions of spine: MRI showing diffuse osteoblastic metastatic disease with pelvic and periaortic adenopathy, pattern suggestive of metastatic prostate cancer. Labs significant for alk phos of 1072. Patient noted a 30-35lb weight loss, he weighed 179lbs at an appt in 01/2015 with a current weight of 165lb concerning for malignancy as well.  Given adenopathy, this is less likely secondary to secondary hyperparathyroidism. Less likely infectious etiology as there's no leukocytosis of fevers.  - admit to telemetry, attending Dr. McDiarmid - Oncology has been consulted, appreciate recommendations   - Pain meds: Morphine q4hrs PRN, gabapentin for radicular pain - Zofran for nausea - Will order PSA tonight. If normal, prostate is likely not the primary site. - attempt to get records from Dr. Jenean Lindau, PCP Oval Linsey) and Liver U/S results from [facility].   2. Atrial Fibrillation: Pt noted to have irregularly irregular rhythm but normal rate on exam. Rate controlled on exam.  Has never been on Coumadin. CHADVASC score is 4. - Continue home meds: Metoprolol tartrate - given high  probability of malignancy, patient is at an increased risk of clot formation- consider discussing anticoagulation at a later time) - Will get a repeat EKG in the morning  3. ESRD on Dialysis: T/Th/Sat dialysis. Patient missed dialysis today. K is 4.6. No need for emergent dialysis.  - Nephro has been consulted, will get dialysis in am  - continue fosrenol   4. HTN - Continue home meds: Norvasc, Metoprolol  5. HLD - Continue home med: Zocor 43m - given patient's ASCVD score, could consider high intensity statin however given current circumstances and overall general health, will continue Zocor for now  6. CHF (diastolic): Last ECHO (31/10/21: EF 55%, moderate LVH, moderate diastolic dysfunction. Patient noted a previous dry weight of 190 but notes he is now 165 due to unintentional weight loss. Does not appear to be significantly volume overloaded. - Continue home med: Metoprolol  7. Constipation - Colace, Miralax  8. PAD: Patient is s/p right femoral to below knee popliteal bypass graft with popliteal aneurysm ligation on 10/18/2012 and left popliteal artery stent for aneurysm on 12/29/2012. He also has a right common iliac aneurysm with Dr. CBridgett Larsson - continue Plavix   9. Anemia: Hgb stable at 9.1 on admission. 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 Last iron panel in our system is form 10/2012 and was consistent with this.  - continue to monitor  FEN/GI: Protonix, IV saline lock Prophylaxis: Heparin  Disposition: Unclear  History of Present Illness:  Tim HOWESis a 74y.o. male presenting with worsening pain in his back for 3 months. He feels like his pain starts in his back and radiates down the L arm and down both legs. The pain is an "11/10". He  has also had difficulty walking because of pain and leg weakness. He has difficulty picking up his feet and sometimes has to slide his feet across the floor. At baseline, he can get around the house on his own and he  only uses a walker for long distances. He has also noticed a 35 lb weight loss over the last 3 months. Today, he called his wife and she noted he was "not thinking clearly". He was disoriented and was "more defiant than usual". He told her that he would not go to dialysis today because his pain was too bad.   He was seen at the end of July in the ED for leg pain. At the time, he had normal sensation and strength in his lower extremities, so it was thought that his leg pain was the result of worsening arterial insufficiency. At some point, he was seen by his PCP who prescribed him Lyrica for presumed sciatica. On 04/23/15, he was seen in the ED for chest pain and had a CTA chest that showed sclerotic bone lesions throughout the thorax, which could be secondary to hyperparathyroidism or metastatic prostate carcinoma. No further workup was done at that time. Pt's wife also noted that he had a liver U/S done at [facility] because some doctors thought something was wrong with his liver. She is unsure what this ultrasound showed. His last colonoscopy was in 2014 and showed a few polyps.   He also has hemorrhoids that cause pain with defecation and bright red blood on the toilet paper. His wife has tried using suppositories and preparation H, but these have not helped.  In the ED, alkaline phosphatase was noted to be 1072. A lumbar MRI revealed diffuse osteoblastic metastatic disease with pelvic and periaortic adenopathy which suggested metastatic prostate cancer, advanced lumbar facet arthropathy with L5-S1 biforaminal stenosis and L5 impingement. A DRE by the EDP revealed the canal to be boggy and engorged in its circumference with irregular surfaces however was difficult to note whether the prostate was also boggy/enlarged/irregular. Oncology was consulted. The patient required 2 doses of morphine 62m.   Review Of Systems: Per HPI with the following additions: +occasional headache, +constipation,  +hematochezia, +SOB at baseline, -fevers Otherwise 12 point review of systems was performed and was unremarkable.  Patient Active Problem List   Diagnosis Date Noted  . Intractable pain 06/14/2015  . Lytic bone lesions on xray 06/14/2015  . Aneurysm of right femoral artery   . SOB (shortness of breath) 04/27/2015  . Hypoxia 04/27/2015  . Pulmonary infiltrate 04/27/2015  . Chest pressure 04/02/2015  . Left hip pain 04/02/2015  . Acute bronchitis 01/02/2015  . Volume overload 01/02/2015  . Acute respiratory failure with hypoxia 01/02/2015  . Aftercare following surgery of the circulatory system, [place]08[place]2013  . Aneurysm of artery of lower extremity 01/14/2013  . PVD (peripheral vascular disease) 11/12/2012  . Iliac artery aneurysm, bilateral 11/12/2012  . Peripheral vascular disease, unspecified 10/01/2012  . End stage renal disease 10/01/2012  . Chest pain 09/21/2012  . Essential hypertension 09/21/2012  . Femoral artery aneurysm, bilateral 09/17/2012  . 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 Hyperlipidemia   . Gout   . Secondary hyperparathyroidism   . Anemia   . Colon, diverticulosis   . Thyroid nodule   . Anginal pain     sees Dr. SLia Foyer . Hypertension     sees Dr. MWilly Eddy .  Chronic kidney disease     Tues, thurs, sat  . Aneurysm artery, popliteal   . Upper respiratory infection April 2016  . Renal insufficiency   . CHF (congestive heart failure)   . Aneurysm of right femoral artery    Past Surgical History: Past Surgical History  Procedure Laterality Date  . Knee surgery      Left cyst removal by Dr. Lorin Mercy  . Av fistula placement      left UA AVF  . Femoral-popliteal bypass graft  10/18/2012    Procedure: BYPASS GRAFT FEMORAL-POPLITEAL ARTERY;  Surgeon: Conrad [place], MD;  Location: [facility] OR;  Service: Vascular;  Laterality: Right;  Right Popliteal Aneurysm Exclusion; Ultrasound guided   . False aneurysm repair Left 12/29/2012    Procedure: REPAIR FALSE ANEURYSM;  Surgeon: Conrad [place], MD;  Location: [place];  Service: Vascular;  Laterality: Left;  . Endovascular repair of popliteal artery aneurysm Left 12/29/2012    Procedure: ENDOVASCULAR REPAIR OF POPLITEAL AND FEMORAL ARTERY ANEURYSM;  Surgeon: Conrad [place], MD;  Location: [place];  Service: Vascular;  Laterality: Left;  . Abdominal aortagram N/A 09/27/2012    Procedure: ABDOMINAL Maxcine Ham;  Surgeon: Angelia Mould, MD;  [facility] CATH LAB;  Service: Cardiovascular;  Laterality: N/A;   Social History: Social History  Substance Use Topics  . Smoking status: Former Smoker    Types: Cigarettes, Pipe    Quit date: 09/18/2007  . Smokeless tobacco: Never Used  . Alcohol Use: No   Additional social history: Lives with wife and his mother in law  Please also refer to relevant sections of EMR.  Family History: Family History  Problem Relation Age of Onset  . Hypertension Mother   . Diabetes Mother   . Diabetes Brother   Father with a history of prostate cancer  Allergies and Medications: No Known Allergies No current facility-administered medications on file prior to encounter.   Current Outpatient Prescriptions on File Prior to Encounter  Medication Sig Dispense Refill  . Albuterol Sulfate 108 (90 BASE) MCG/ACT AEPB Inhale 2 puffs into the lungs every 4 (four) hours as needed.    Marland Kitchen amLODipine (NORVASC) 5 MG tablet Take 5 mg by mouth daily.     . clopidogrel (PLAVIX) 75 MG tablet Take 1 tablet (75 mg total) by mouth daily. 90 tablet 3  . lanthanum (FOSRENOL) 1000 MG chewable tablet Chew 1,000 mg by mouth 2 (two) times daily with a meal.    . metoprolol tartrate (LOPRESSOR) 25 MG tablet Take 25 mg by mouth 2 (two) times daily.    . multivitamin (RENA-VIT) TABS tablet Take 1 tablet by mouth daily.    Marland Kitchen omeprazole (PRILOSEC) 40 MG capsule Take 40 mg by mouth daily.    . simvastatin (ZOCOR) 40 MG tablet Take 40  mg by mouth every evening.    . benzonatate (TESSALON) 200 MG capsule Take 200 mg by mouth every 8 (eight) hours.  0    Objective: BP 134/86 mmHg  Pulse 98  Temp(Src) 98.1 F (36.7 C) (Oral)  Resp 16  Ht '5\' 8"'  (1.727 m)  Wt 165 lb (74.844 kg)  BMI 25.09 kg/m2  SpO2 99% Exam: General: Elderly man lying in bed, in NAD  HEENT: [place]/AT, EOMI, PERRLA, MMM Neck: Supple, no JVD Cardiovascular: Irregular rhythm, normal rate, no m/r/g.  Faint DP pulses.  Respiratory: Normal effort, crackles noted in lung bases bilaterally, no wheezes Abdomen: +BS, soft, non-tender, non-distended MSK: Point tenderness to palpation of  spinous processes at multiple levels (T7, T10, L3, sacrum), R leg appears more edematous than L leg (+1 pitting edema) , AV fistula with thrill present on L forearm Skin: Warm and well perfused with no rashes or lesions Neuro: Alert and oriented, 5/5 muscle strength in upper extremities, can move L leg laterally, can briefly lift R leg off bed however severely limited by pain, normal sensation except for decreased sensation noted at front of R leg, CN 2-12 grossly intact. Psych: Appropriate mood and affect.   Labs and Imaging: CBC BMET   Recent Labs Lab 06/14/15 1132  WBC 7.2  HGB 9.1*  HCT 29.2*  PLT 104*    Recent Labs Lab 06/14/15 1132  NA 139  K 4.6  CL 98*  CO2 27  BUN 46*  CREATININE 8.08*  GLUCOSE 106*  CALCIUM 9.5     EKG: atrial fibrillation, HR 73 nonspecific ST and T wave abnormalities without significant change from previous EKGs  Mr Lumbar Spine Wo Contrast  06/14/2015   CLINICAL DATA:  Back pain with radiculopathy and gait dysfunction.  EXAM: MRI LUMBAR SPINE WITHOUT CONTRAST  TECHNIQUE: Multiplanar, multisequence MR imaging of the lumbar spine was performed. No intravenous contrast was administered.  COMPARISON:  None.  FINDINGS: Transitional lumbosacral anatomy with rudimentary S1-S2 disc space.  Diffusely abnormal appearance of the marrow with  confluent hypo intensity on T1 weighted imaging. Given chest CT findings 04/23/2015 and bulky pelvic and lower retroperitoneal adenopathy, prostate cancer is strongly favored. No definitive extra osseous tumor to explain symptoms. 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  Hyperintensity at the level of the sacral ala and body is likely from field in homogeneity as on axial imaging there is no discrete fracture line or edema. Presacral edema which is nonspecific.  Normal conus signal and morphology.  Bilateral polycystic kidneys with atrophy, dialysis associated pattern.  Degenerative changes:  T12- L1: Unremarkable.  L1-L2: Unremarkable.  L2-L3: Mild spondylosis. Mild facet and ligament overgrowth. No impingement.  L3-L4: Mild disc bulging in the biforaminal regions. Facet and ligament overgrowth. No impingement.  L4-L5: Facet and ligament overgrowth which is moderate, with joint effusion on the right. Disc bulging, greater to the left. Mild to moderate biforaminal stenosis.  L5-S1:Disc narrowing with circumferential bulging. Bulky facet arthropathy with joint effusions. Biforaminal stenosis and L5 impingement. Subarticular recess effacement without discrete impingement.  IMPRESSION: 1. Diffuse osteoblastic metastatic disease with pelvic and periaortic adenopathy. The pattern suggests metastatic prostate cancer. 2. Advanced lower lumbar facet arthropathy with L5-S1 biforaminal stenosis and L5 impingement. 3.  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   Electronically Signed   By: Monte Fantasia M.D.   On: 06/14/2015 14:51     Sela Hua, MD 06/14/2015, 6:48 PM PGY-1, [place] Intern pager: [phone], text pages welcome  Upper Level Addendum:  I have seen and evaluated this patient along with Dr. Brett Albino and reviewed the above note, making necessary revisions in purple.   Archie Patten, MD [place]  Resident, PGY-2

## 2015-06-15 ENCOUNTER — Inpatient Hospital Stay (HOSPITAL_COMMUNITY): Payer: Medicare Other

## 2015-06-15 ENCOUNTER — Ambulatory Visit: Payer: Medicare Other | Admitting: Vascular Surgery

## 2015-06-15 ENCOUNTER — Inpatient Hospital Stay: Admission: RE | Admit: 2015-06-15 | Payer: Medicare Other | Source: Ambulatory Visit

## 2015-06-15 ENCOUNTER — Encounter (HOSPITAL_COMMUNITY): Payer: Self-pay | Admitting: General Practice

## 2015-06-15 DIAGNOSIS — C799 Secondary malignant neoplasm of unspecified site: Secondary | ICD-10-CM | POA: Insufficient documentation

## 2015-06-15 DIAGNOSIS — C641 Malignant neoplasm of right kidney, except renal pelvis: Secondary | ICD-10-CM

## 2015-06-15 DIAGNOSIS — M545 Low back pain, unspecified: Secondary | ICD-10-CM | POA: Insufficient documentation

## 2015-06-15 DIAGNOSIS — R52 Pain, unspecified: Secondary | ICD-10-CM

## 2015-06-15 DIAGNOSIS — C801 Malignant (primary) neoplasm, unspecified: Secondary | ICD-10-CM

## 2015-06-15 DIAGNOSIS — C7951 Secondary malignant neoplasm of bone: Principal | ICD-10-CM

## 2015-06-15 DIAGNOSIS — R29898 Other symptoms and signs involving the musculoskeletal system: Secondary | ICD-10-CM

## 2015-06-15 DIAGNOSIS — I10 Essential (primary) hypertension: Secondary | ICD-10-CM

## 2015-06-15 DIAGNOSIS — E785 Hyperlipidemia, unspecified: Secondary | ICD-10-CM

## 2015-06-15 DIAGNOSIS — N186 End stage renal disease: Secondary | ICD-10-CM | POA: Insufficient documentation

## 2015-06-15 DIAGNOSIS — I999 Unspecified disorder of circulatory system: Secondary | ICD-10-CM

## 2015-06-15 LAB — RENAL FUNCTION PANEL
ALBUMIN: 3.6 g/dL (ref 3.5–5.0)
ANION GAP: 19 — AB (ref 5–15)
BUN: 17 mg/dL (ref 6–20)
CO2: 23 mmol/L (ref 22–32)
Calcium: 9.4 mg/dL (ref 8.9–10.3)
Chloride: 96 mmol/L — ABNORMAL LOW (ref 101–111)
Creatinine, Ser: 4.49 mg/dL — ABNORMAL HIGH (ref 0.61–1.24)
GFR calc non Af Amer: 12 mL/min — ABNORMAL LOW (ref >60)
GFR, EST AFRICAN AMERICAN: 14 mL/min — AB (ref >60)
GLUCOSE: 124 mg/dL — AB (ref 65–99)
PHOSPHORUS: 4.1 mg/dL (ref 2.5–4.6)
POTASSIUM: 4 mmol/L (ref 3.5–5.1)
Sodium: 138 mmol/L (ref 135–145)

## 2015-06-15 LAB — CBC
HEMATOCRIT: 33.3 % — AB (ref 39.0–52.0)
HEMOGLOBIN: 10.6 g/dL — AB (ref 13.0–17.0)
MCH: 30 pg (ref 26.0–34.0)
MCHC: 31.8 g/dL (ref 30.0–36.0)
MCV: 94.3 fL (ref 78.0–100.0)
Platelets: 98 10*3/uL — ABNORMAL LOW (ref 150–400)
RBC: 3.53 MIL/uL — AB (ref 4.22–5.81)
RDW: 20.7 % — ABNORMAL HIGH (ref 11.5–15.5)
WBC: 8 10*3/uL (ref 4.0–10.5)

## 2015-06-15 MED ORDER — LIDOCAINE HCL (PF) 1 % IJ SOLN
5.0000 mL | INTRAMUSCULAR | Status: DC | PRN
  Filled 2015-06-15: qty 5

## 2015-06-15 MED ORDER — SODIUM CHLORIDE 0.9 % IV SOLN: 100.0000 mL | INTRAVENOUS | Status: DC | PRN

## 2015-06-15 MED ORDER — MORPHINE SULFATE (PF) 4 MG/ML IV SOLN
6.0000 mg | INTRAVENOUS | Status: DC | PRN
  Administered 2015-06-16: 6 mg via INTRAVENOUS
  Filled 2015-06-15 (×2): qty 2

## 2015-06-15 MED ORDER — SENNOSIDES-DOCUSATE SODIUM 8.6-50 MG PO TABS
2.0000 | ORAL_TABLET | Freq: Every day | ORAL | Status: DC
  Administered 2015-06-15 – 2015-06-16 (×2): 2 via ORAL
  Filled 2015-06-15 (×2): qty 2

## 2015-06-15 MED ORDER — ALTEPLASE 2 MG IJ SOLR
2.0000 mg | Freq: Once | INTRAMUSCULAR | Status: DC | PRN
  Filled 2015-06-15: qty 2

## 2015-06-15 MED ORDER — PENTAFLUOROPROP-TETRAFLUOROETH EX AERO
1.0000 | INHALATION_SPRAY | CUTANEOUS | Status: DC | PRN
Start: 2015-06-15 — End: 2015-06-17

## 2015-06-15 MED ORDER — LIDOCAINE-PRILOCAINE 2.5-2.5 % EX CREA
1.0000 "application " | TOPICAL_CREAM | CUTANEOUS | Status: DC | PRN
  Filled 2015-06-15: qty 5

## 2015-06-15 MED ORDER — DEXAMETHASONE 2 MG PO TABS
2.0000 mg | ORAL_TABLET | Freq: Every day | ORAL | Status: DC
  Administered 2015-06-16 – 2015-06-17 (×2): 2 mg via ORAL
  Filled 2015-06-15 (×2): qty 1

## 2015-06-15 MED ORDER — HEPARIN SODIUM (PORCINE) 1000 UNIT/ML DIALYSIS
5000.0000 [IU] | Freq: Once | INTRAMUSCULAR | Status: DC
  Filled 2015-06-15: qty 5

## 2015-06-15 MED ORDER — ATORVASTATIN CALCIUM 20 MG PO TABS
20.0000 mg | ORAL_TABLET | Freq: Every day | ORAL | Status: DC
  Administered 2015-06-15: 20 mg via ORAL
  Filled 2015-06-15 (×2): qty 1

## 2015-06-15 MED ORDER — DOXERCALCIFEROL 4 MCG/2ML IV SOLN
7.0000 ug | INTRAVENOUS | Status: DC
  Administered 2015-06-17: 7 ug via INTRAVENOUS
  Filled 2015-06-15: qty 4

## 2015-06-15 MED ORDER — ENSURE ENLIVE PO LIQD: 237.0000 mL | Freq: Two times a day (BID) | ORAL | Status: DC

## 2015-06-15 MED ORDER — DEXAMETHASONE 6 MG PO TABS
6.0000 mg | ORAL_TABLET | ORAL | Status: AC
Start: 2015-06-15 — End: 2015-06-15
  Administered 2015-06-15: 6 mg via ORAL
  Filled 2015-06-15: qty 1

## 2015-06-15 MED ORDER — INFLUENZA VAC SPLIT QUAD 0.5 ML IM SUSY
0.5000 mL | PREFILLED_SYRINGE | INTRAMUSCULAR | Status: AC
  Administered 2015-06-17: 0.5 mL via INTRAMUSCULAR
  Filled 2015-06-15: qty 0.5

## 2015-06-15 MED ORDER — HEPARIN SODIUM (PORCINE) 1000 UNIT/ML DIALYSIS
1000.0000 [IU] | INTRAMUSCULAR | Status: DC | PRN
  Filled 2015-06-15: qty 1

## 2015-06-15 MED ORDER — NEPRO/CARBSTEADY PO LIQD
237.0000 mL | Freq: Two times a day (BID) | ORAL | Status: DC
  Administered 2015-06-16 (×2): 237 mL via ORAL

## 2015-06-15 MED ORDER — RENA-VITE PO TABS
1.0000 | ORAL_TABLET | Freq: Every day | ORAL | Status: DC
  Administered 2015-06-15 – 2015-06-16 (×2): 1 via ORAL
  Filled 2015-06-15 (×2): qty 1

## 2015-06-15 NOTE — Progress Notes (Signed)
Initial Nutrition Assessment  DOCUMENTATION CODES:   Non-severe (moderate) malnutrition in context of chronic illness   INTERVENTION:    Nepro Shake po BID, each supplement provides 425 kcal and 19 grams protein  NUTRITION DIAGNOSIS:   Malnutrition related to chronic illness as evidenced by mild depletion of muscle mass, mild depletion of body fat.  GOAL:   Patient will meet greater than or equal to 90% of their needs  MONITOR:   PO intake, Supplement acceptance, Labs, Weight trends  REASON FOR ASSESSMENT:   Malnutrition Screening Tool    ASSESSMENT:   74 y.o. male presenting with back pain and bilateral leg weakness for the last 3 months. MRI showing diffuse metastatic disease, thought to be prostate in origin. Admitted for pain control. PMH is significant for HTN, ESRD on dialysis, PVD, HLD, CHF, secondary hyperparathyroidism, anemia, gout.  Labs reviewed: BUN, creatinine, alk phos are elevated.  Spoke with patient and his brother. Patient reports that he has been eating well for the past week, but was eating poorly for a little while prior to that. Nutrition-Focused physical exam completed. Findings are mild fat depletion, mild-moderate muscle depletion, and no edema. Patient drinks Ensure supplements at home, likes vanilla and chocolate flavors. Patient reports good appetite now, consumed 100% of supper yesterday.  Diet Order:   Heart Healthy  Skin:  Reviewed, no issues  Last BM:  unknown  Height:   Ht Readings from Last 1 Encounters:  06/14/15 '5\' 8"'  (1.727 m)    Weight:   Wt Readings from Last 1 Encounters:  06/14/15 171 lb 8.3 oz (77.8 kg)    Ideal Body Weight:  70 kg  BMI:  Body mass index is 26.09 kg/(m^2).  Estimated Nutritional Needs:   Kcal:  3524-8185  Protein:  100-120 gm  Fluid:  1.2 L  EDUCATION NEEDS:   No education needs identified at this time  Molli Barrows, [place], [place], [place] Pager [phone] After Hours Pager [phone]

## 2015-06-15 NOTE — Progress Notes (Signed)
Family Medicine Teaching Service Daily Progress Note Intern Pager: [phone]  [name] Medical record number: [id number] Date of birth: [birth year] Age: 74 y.o. Gender: male  Primary Care Provider: Maris Berger, MD Consultants: Mariana Kaufman  Code Status: Full  Pt Overview and Major Events to Date:  9/8: Admitted; MRI Lumbar Spine--diffuse osteoblastic metastatic disease with pelvic and periaortic adenopathy suggestive of metastatic prostate CA; PSA 494  Assessment and Plan: Tim Kirby is a 74 y.o. male presenting with back pain and bilateral leg weakness for the last 3 months. MRI showing diffuse metastatic disease, thought to be prostate in origin. Admitted for pain control. PMH is significant for HTN, ESRD on dialysis, PVD, HLD, CHF (last ECHO 3/30: EF 55%, moderate LVH, moderate diastolic dysfunction), secondary hyperparathyroidism, anemia, gout.  1. Back pain with osteoblastic lesions of spine: MRI showing diffuse osteoblastic metastatic disease with pelvic and periaortic adenopathy, pattern suggestive of metastatic prostate cancer. Labs significant for alk phos of 1072. Patient noted a 30-35lb weight loss, he weighed 179lbs at an appt in 01/2015 with a current weight of 165lb concerning for malignancy as well. Given adenopathy, this is less likely secondary to secondary hyperparathyroidism. Less likely infectious etiology as there's no leukocytosis of fevers. PSA 494.  - admit to telemetry, attending Dr. McDiarmid - Oncology has been consulted, appreciate recommendations>> need biopsy with bone lesion to confirm the diagnosis; will be seen later today or tomorrow to discuss treatment options and prognosis - Pain meds: Morphine q4hrs PRN, gabapentin for radicular pain - Zofran for nausea - attempt to get records from Dr. Jenean Lindau, PCP Oval Linsey) and Liver U/S results from [facility]. -spiritual care consult placed for prayers at patient and family's  request  -MRI of thoracic and cervical spine d/t weakness  -IR consult for bone biopsy    2. Atrial Fibrillation: Pt noted to have irregularly irregular rhythm but normal rate on exam. Rate controlled on exam. Has never been on Coumadin. CHADVASC score is 4. EKG in ED demonstrated Afib.  - Continue home meds: Metoprolol tartrate - given high probability of malignancy, patient is at an increased risk of clot formation- consider discussing anticoagulation at a later time) - Will get a repeat EKG in the morning; not yet performed   3. ESRD on Dialysis: T/Th/Sat dialysis. Patient missed dialysis on 9/8. K is 4.6. No need for emergent dialysis.  - Nephro has been consulted, dialysis today  - continue fosrenol   4. HTN - Continue home meds: Norvasc, Metoprolol  5. HLD - Continue home med: Zocor $RemoveBe'40mg'ftTkrtgPD$  - given patient's ASCVD score, could consider high intensity statin however given current circumstances and overall general health, will continue Zocor for now  6. CHF (diastolic): Last ECHO (3/79/02): EF 55%, moderate LVH, moderate diastolic dysfunction. Patient noted a previous dry weight of 190 but notes he is now 165 due to unintentional weight loss. Does not appear to be significantly volume overloaded. - Continue home med: Metoprolol  7. Constipation - Colace, Miralax  8. PAD: Patient is s/p right femoral to below knee popliteal bypass graft with popliteal aneurysm ligation on 10/18/2012 and left popliteal artery stent for aneurysm on 12/29/2012. He also has a right common iliac aneurysm with Dr. Bridgett Larsson. - continue Plavix   9. Anemia: Hgb stable at 9.1 on admission. 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 Last iron panel in our system is form 10/2012 and was consistent with this.  - continue to monitor  FEN/GI: Protonix, IV saline lock Prophylaxis: Heparin   Disposition: Unclear; most likely home pending onc consult and pain control   Subjective:  Patient reporting  pain in legs and back that is better controlled now with pain medications. States that after taking pain meds he sleeps well.   Objective: Temp:  [97.6 F (36.4 C)-98.1 F (36.7 C)] 97.6 F (36.4 C) (09/09 0502) Pulse Rate:  [72-106] 93 (09/09 0502) Resp:  [15-22] 18 (09/09 0502) BP: (121-157)/(73-97) 150/90 mmHg (09/09 0502) SpO2:  [93 %-99 %] 99 % (09/09 0502) Weight:  [165 lb (74.844 kg)-171 lb 8.3 oz (77.8 kg)] 171 lb 8.3 oz (77.8 kg) (09/08 2048) Physical Exam: General: Elderly man lying in bed, NAD  Cardiovascular: Irregular rhythm, normal rate, no murmurs appreciated Respiratory: Mild crackles in lung bases bilaterally. Normal WOB.  Abdomen: +BS, soft, NTND Extremities: 1+ edema to R LE, trace edema to L LE, weakness to LE R>L Psych: Appropriate mood and affect.   Laboratory:  Recent Labs Lab 06/14/15 1132  WBC 7.2  HGB 9.1*  HCT 29.2*  PLT 104*    Recent Labs Lab 06/14/15 1132  NA 139  K 4.6  CL 98*  CO2 27  BUN 46*  CREATININE 8.08*  CALCIUM 9.5  PROT 5.9*  BILITOT 0.7  ALKPHOS 1072*  ALT 18  AST 34  GLUCOSE 106*      Ref Range 1d ago    PSA 0.00 - 4.00 ng/mL 494.00 (H)           Imaging/Diagnostic Tests: Mr Lumbar Spine Wo Contrast  06/14/2015   CLINICAL DATA:  Back pain with radiculopathy and gait dysfunction.  EXAM: MRI LUMBAR SPINE WITHOUT CONTRAST  TECHNIQUE: Multiplanar, multisequence MR imaging of the lumbar spine was performed. No intravenous contrast was administered.  COMPARISON:  None.  FINDINGS: Transitional lumbosacral anatomy with rudimentary S1-S2 disc space.  Diffusely abnormal appearance of the marrow with confluent hypo intensity on T1 weighted imaging. Given chest CT findings 04/23/2015 and bulky pelvic and lower retroperitoneal adenopathy, prostate cancer is strongly favored. No definitive extra osseous tumor to explain symptoms. 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  Hyperintensity at  the level of the sacral ala and body is likely from field in homogeneity as on axial imaging there is no discrete fracture line or edema. Presacral edema which is nonspecific.  Normal conus signal and morphology.  Bilateral polycystic kidneys with atrophy, dialysis associated pattern.  Degenerative changes:  T12- L1: Unremarkable.  L1-L2: Unremarkable.  L2-L3: Mild spondylosis. Mild facet and ligament overgrowth. No impingement.  L3-L4: Mild disc bulging in the biforaminal regions. Facet and ligament overgrowth. No impingement.  L4-L5: Facet and ligament overgrowth which is moderate, with joint effusion on the right. Disc bulging, greater to the left. Mild to moderate biforaminal stenosis.  L5-S1:Disc narrowing with circumferential bulging. Bulky facet arthropathy with joint effusions. Biforaminal stenosis and L5 impingement. Subarticular recess effacement without discrete impingement.  IMPRESSION: 1. Diffuse osteoblastic metastatic disease with pelvic and periaortic adenopathy. The pattern suggests metastatic prostate cancer. 2. Advanced lower lumbar facet arthropathy with L5-S1 biforaminal stenosis and L5 impingement. 3.  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   Electronically Signed   By: Monte Fantasia M.D.   On: 06/14/2015 14:51   EKG (9/8): atrial fibrillation, HR 73 nonspecific ST and T wave abnormalities without significant change from previous EKGs  Nicolette Bang, DO 06/15/2015, 7:31 AM PGY-1, [facility]  [place] Intern pager: [phone], text pages welcome

## 2015-06-15 NOTE — Consult Note (Signed)
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   HPI: This is a pleasant 74 year old gentleman with history of end-stage renal disease currently hemodialysis-dependent and receives his dialysis in [place]. He also has history of hypertension, hyperlipidemia and vascular disease. He has been in reasonable health except for the last 3 months where he had noticed overall decline. He had reported increased back pain and lower extremity weakness associated with significant weight loss. He subsequently was evaluated at [facility] emergency department on 06/14/2015 and underwent an MRI of the spine.MRI of the lumbar spine showed diffuse osteoblastic disease in the pelvic bones as well as adenopathy suggestive of advanced malignancy. There is no evidence of any cord compression or epidural encroachment of the tumor. Laboratory testing included a PSA was elevated at 494 and his alkaline phosphatase was elevated at 1072. He had normal liver function tests otherwise. His creatinine is 8.08 consistent with his renal failure. His CBC showed a hemoglobin of 9.1 with a platelet count of 104. He was started on pain medication currently receiving intravenous morphine.clinically, he reports some improvement in his back pain and has been able to ambulate to the bathroom. His urine output to have been reasonable without any dysuria or hematuria. He has not reported any nocturia or weak stream.  He does not report any headaches, blurry vision, syncope or seizures. He does not report any fevers, chills, sweats does report weight loss and poor appetite. He isn't reporting any chest pain, palpitation, orthopnea or leg edema. Does not report any cough, wheezing or hemoptysis. He does not report any nausea, vomiting, abdominal pain. He does not report any constipation, diarrhea or hematochezia. He does report skeletal complaints as mentioned. He does not report any lymphadenopathy or petechiae. Remaining review of  systems unremarkable.  Past Medical History  Diagnosis Date  . Hyperlipidemia   . Gout   . Secondary hyperparathyroidism   . Anemia   . Colon, diverticulosis   . Thyroid nodule   . Anginal pain     sees Dr. Lia Foyer  . Hypertension     sees Dr. Willy Eddy  . Aneurysm artery, popliteal   . Upper respiratory infection April 2016  . CHF (congestive heart failure)   . Aneurysm of right femoral artery   . ESRD (end stage renal disease) on dialysis     "TTS; [facility]" (06/15/2015)  . Renal insufficiency   :  Past Surgical History  Procedure Laterality Date  . Knee surgery Left     cyst removal by Dr. Lorin Mercy  . Av fistula placement Left     left UA AVF  . Femoral-popliteal bypass graft  10/18/2012    Procedure: BYPASS GRAFT FEMORAL-POPLITEAL ARTERY;  Surgeon: Conrad Buckhorn, MD;  Location: [facility] OR;  Service: Vascular;  Laterality: Right;  Right Popliteal Aneurysm Exclusion; Ultrasound guided  . False aneurysm repair Left 12/29/2012    Procedure: REPAIR FALSE ANEURYSM;  Surgeon: Conrad [place], MD;  Location: [facility];  Service: Vascular;  Laterality: Left;  . Endovascular repair of popliteal artery aneurysm Left 12/29/2012    Procedure: ENDOVASCULAR REPAIR OF POPLITEAL AND FEMORAL ARTERY ANEURYSM;  Surgeon: Conrad Hanson, MD;  Location: [facility];  Service: Vascular;  Laterality: Left;  . Abdominal aortagram N/A 09/27/2012    Procedure: ABDOMINAL Maxcine Ham;  Surgeon: Angelia Mould, MD;  Location: [facility] CATH LAB;  Service: Cardiovascular;  Laterality: N/A;  . Tonsillectomy and adenoidectomy    :   Current facility-administered medications:  .  0.9 %  sodium chloride infusion, 250 mL, Intravenous, PRN, Archie Patten, MD .  albuterol (PROVENTIL) (2.5 MG/3ML) 0.083% nebulizer solution 2.5 mg, 2.5 mg, Nebulization, Q4H PRN, Zenia Resides, MD .  amLODipine (NORVASC) tablet 5 mg, 5 mg, Oral, Daily, Archie Patten, MD, 5 mg at 06/15/15 0931 .  clopidogrel (PLAVIX) tablet 75 mg, 75 mg, Oral, Daily,  Archie Patten, MD, 75 mg at 06/15/15 0931 .  [START ON 06/16/2015] dexamethasone (DECADRON) tablet 2 mg, 2 mg, Oral, Daily, Todd D McDiarmid, MD .  dexamethasone (DECADRON) tablet 6 mg, 6 mg, Oral, NOW, Todd D McDiarmid, MD .  feeding supplement (ENSURE ENLIVE) (ENSURE ENLIVE) liquid 237 mL, 237 mL, Oral, BID BM, Donato Heinz, MD, 237 mL at 06/15/15 1000 .  gabapentin (NEURONTIN) capsule 100 mg, 100 mg, Oral, BID, Archie Patten, MD, 100 mg at 06/15/15 0931 .  heparin injection 5,000 Units, 5,000 Units, Subcutaneous, 3 times per day, Archie Patten, MD, 5,000 Units at 06/15/15 0534 .  [START ON 06/16/2015] Influenza vac split quadrivalent PF (FLUARIX) injection 0.5 mL, 0.5 mL, Intramuscular, Tomorrow-1000, Donato Heinz, MD .  lanthanum [facility]) chewable tablet 1,000 mg, 1,000 mg, Oral, BID WC, Archie Patten, MD, 1,000 mg at 06/15/15 0931 .  metoprolol tartrate (LOPRESSOR) tablet 25 mg, 25 mg, Oral, BID, Archie Patten, MD, 25 mg at 06/15/15 0931 .  morphine 4 MG/ML injection 6 mg, 6 mg, Intravenous, Q4H PRN, Blane Ohara McDiarmid, MD .  ondansetron (ZOFRAN) tablet 4 mg, 4 mg, Oral, Q6H PRN **OR** ondansetron (ZOFRAN) injection 4 mg, 4 mg, Intravenous, Q6H PRN, Archie Patten, MD .  pantoprazole (PROTONIX) EC tablet 40 mg, 40 mg, Oral, Daily, Archie Patten, MD, 40 mg at 06/15/15 0931 .  polyethylene glycol (MIRALAX / GLYCOLAX) packet 17 g, 17 g, Oral, Daily PRN, Archie Patten, MD .  senna-docusate (Senokot-S) tablet 2 tablet, 2 tablet, Oral, QHS, Todd D McDiarmid, MD .  simvastatin (ZOCOR) tablet 40 mg, 40 mg, Oral, QPM, Archie Patten, MD, 40 mg at 06/14/15 2337 .  sodium chloride 0.9 % injection 3 mL, 3 mL, Intravenous, Q12H, Archie Patten, MD, 3 mL at 06/15/15 0932 .  sodium chloride 0.9 % injection 3 mL, 3 mL, Intravenous, Q12H, Archie Patten, MD, 3 mL at 06/15/15 0932 .  sodium chloride 0.9 % injection 3 mL, 3 mL, Intravenous, PRN, Archie Patten, MD:  No  Known Allergies:  Family History  Problem Relation Age of Onset  . Hypertension Mother   . Diabetes Mother   . Diabetes Brother   :  Social History   Social History  . Marital Status: Married    Spouse Name: N/A  . Number of Children: N/A  . Years of Education: N/A   Occupational History  . Not on file.   Social History Main Topics  . Smoking status: Former Smoker -- 50 years    Types: Pipe, Cigars, Cigarettes    Quit date: 09/18/2007  . Smokeless tobacco: Never Used     Comment: "mostly smoked cigars and pipes"  . Alcohol Use: Yes     Comment: "stopped drinking in ~ 2008"  . Drug Use: No  . Sexual Activity: Not Currently   Other Topics Concern  . Not on file   Social History Narrative  :  Pertinent items are noted in HPI.  Exam: ECOG 1 Blood pressure 150/90, pulse 93, temperature 97.6 F (36.4 C), temperature source Oral, resp.  rate 18, height 5\' 8"  (1.727 m), weight 171 lb 8.3 oz (77.8 kg), SpO2 99 %. General appearance: alert and cooperative Head: Normocephalic, without obvious abnormality Throat: lips, mucosa, and tongue normal; teeth and gums normal Neck: no adenopathy Back: negative Resp: clear to auscultation bilaterally Chest wall: no tenderness Cardio: irregularly irregular rhythm GI: soft, non-tender; bowel sounds normal; no masses,  no organomegaly Extremities: extremities normal, atraumatic, no cyanosis or edema Pulses: 2+ and symmetric Skin: Skin color, texture, turgor normal. No rashes or lesions Lymph nodes: Cervical, supraclavicular, and axillary nodes normal. Neurologic: Grossly normal   Recent Labs  06/14/15 1132  WBC 7.2  HGB 9.1*  HCT 29.2*  PLT 104*    Recent Labs  06/14/15 1132  NA 139  K 4.6  CL 98*  CO2 27  GLUCOSE 106*  BUN 46*  CREATININE 8.08*  CALCIUM 9.5       Mr Lumbar Spine Wo Contrast  06/14/2015   CLINICAL DATA:  Back pain with radiculopathy and gait dysfunction.  EXAM: MRI LUMBAR SPINE WITHOUT  CONTRAST  TECHNIQUE: Multiplanar, multisequence MR imaging of the lumbar spine was performed. No intravenous contrast was administered.  COMPARISON:  None.  FINDINGS: Transitional lumbosacral anatomy with rudimentary S1-S2 disc space.  Diffusely abnormal appearance of the marrow with confluent hypo intensity on T1 weighted imaging. Given chest CT findings 04/23/2015 and bulky pelvic and lower retroperitoneal adenopathy, prostate cancer is strongly favored. No definitive extra osseous tumor to explain symptoms. 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  Hyperintensity at the level of the sacral ala and body is likely from field in homogeneity as on axial imaging there is no discrete fracture line or edema. Presacral edema which is nonspecific.  Normal conus signal and morphology.  Bilateral polycystic kidneys with atrophy, dialysis associated pattern.  Degenerative changes:  T12- L1: Unremarkable.  L1-L2: Unremarkable.  L2-L3: Mild spondylosis. Mild facet and ligament overgrowth. No impingement.  L3-L4: Mild disc bulging in the biforaminal regions. Facet and ligament overgrowth. No impingement.  L4-L5: Facet and ligament overgrowth which is moderate, with joint effusion on the right. Disc bulging, greater to the left. Mild to moderate biforaminal stenosis.  L5-S1:Disc narrowing with circumferential bulging. Bulky facet arthropathy with joint effusions. Biforaminal stenosis and L5 impingement. Subarticular recess effacement without discrete impingement.  IMPRESSION: 1. Diffuse osteoblastic metastatic disease with pelvic and periaortic adenopathy. The pattern suggests metastatic prostate cancer. 2. Advanced lower lumbar facet arthropathy with L5-S1 biforaminal stenosis and L5 impingement. 3.  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   Electronically Signed   By: Monte Fantasia M.D.   On: 06/14/2015 14:51    Assessment and Plan:    74 year old gentleman  with the following issues:  1. Advanced malignancy presenting with osteoblastic metastasis in the pelvis as well as the periaortic adenopathy. His PSA is elevated up to 494 with alkaline phosphatase up to 1072. The differential diagnosis was discussed with the patient and his brother that was present at bedside. Most likely we are dealing with metastatic prostate cancer with disease to the bone and pelvic adenopathy. Other malignancy could be the cause but I cannot explain the elevation in his PSA.  The natural course of advanced prostate cancer was discussed with the patient and his brother. He understand now that he has likely an incurable malignancy however his cancer can be palliated at least for a period of time with hormone therapy. Testosterone deprivation therapy  can palliate his symptoms, control his cancer and extending his overall survival for a significant period of time. But it will not offer cure for his cancer. This hormone therapy will consist of LH-RH agonists in the form of Lupron to be given every 3-4 months concomitantly with initial 1 month of bicalutamide.the combination of  both agents we will drop his PSA and palliate his cancer pain for a median. If time close to 12-14 months. After that, he will likely develop castration resistant disease and might require different therapy.  The role of systemic chemotherapy in the form of Taxotere in addition to hormone therapy have been documented and certainly at significant palliation of his cancer and extends overall survival close to 13 months. However, he is not a candidate for this therapy because of his comorbid conditions and debilitated state. This could be evaluated in the future if needed to.  Given his multiple comorbid conditions his prognosis is certainly worse than most people with advanced prostate cancer but still his cancer is at least treatable and can be palliated for a period of time.  My recommendation at this point as  follows:  Obtain a biopsy of his adnopathy or bone lesions via interventional radiology. Alternatively, a prostate biopsy with the help of urology would also be  an option.  I would start bicalutamide at 50 mg daily for total of 30 days and I would not continue it beyond that.  He will need an oncology follow up upon his discharge. I'll be happy to see him in [place] but he lives close to [place] and follow-up at the [facility] would be more convenient.   2. Pain management: He is currently on morphine which have helped his symptoms. I would probably recommend a long-acting pain medication upon his discharge keeping in mind his kidney function and his dialysis dependence.  I have given my contact information to the patient and his brother and I'll be happy to assist in his care at any point needed. Do not hesitate to contact me at any time. I'm available over the weekend for any consultation.

## 2015-06-15 NOTE — Procedures (Signed)
Patient was seen on dialysis and the procedure was supervised.  BFR 400  Via AVF BP is  132/73.   Patient appears to be tolerating treatment well  Ariauna Farabee A 06/15/2015

## 2015-06-15 NOTE — Consult Note (Signed)
Indication for Consultation:  Management of ESRD/hemodialysis; anemia, hypertension/volume and secondary hyperparathyroidism  HPI: Tim Kirby is a 74 y.o. male who presented to the ED yesterday with complaints of severe back pain. He receives HD TTS @ [facility], missed HD yesterday d/t pain. History of HTN, PVD and CHF. He reports months of lower back pain which has been getting progressively worse over the past 2 weeks. He has been having LE weakness which has been making it difficult to ambulate. Pain was severe yesterday and he knew he would not be able to sit through HD so he came to the ED for evaluation. 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 PSA 494. Oncology has been consulted- planning for bone biopsy but needs to be off plavix. Will arrange HD while admitted. Saw Tim Kirby last week at OP center- he was failing to thrive which is much different for him- I guess we now have an explanation. [name]  Past Medical History  Diagnosis Date  . Hyperlipidemia   . Gout   . Secondary hyperparathyroidism   . Anemia   . Colon, diverticulosis   . Thyroid nodule   . Anginal pain     sees Dr. Lia Foyer  . Hypertension     sees Dr. Willy Eddy  . Aneurysm artery, popliteal   . Upper respiratory infection April 2016  . CHF (congestive heart failure)   . Aneurysm of right femoral artery   . ESRD (end stage renal disease) on dialysis     "TTS; [facility]" (06/15/2015)  . Renal insufficiency    Past Surgical History  Procedure Laterality Date  . Knee surgery Left     cyst removal by Dr. Lorin Mercy  . Av fistula placement Left     left UA AVF  . Femoral-popliteal bypass graft  10/18/2012    Procedure: BYPASS GRAFT FEMORAL-POPLITEAL ARTERY;  Surgeon: Conrad [place], MD;  Location: [facility] OR;  Service: Vascular;  Laterality: Right;  Right Popliteal Aneurysm Exclusion; Ultrasound guided  . False aneurysm repair Left 12/29/2012    Procedure:  REPAIR FALSE ANEURYSM;  Surgeon: Conrad Alabaster, MD;  Location: [facility];  Service: Vascular;  Laterality: Left;  . Endovascular repair of popliteal artery aneurysm Left 12/29/2012    Procedure: ENDOVASCULAR REPAIR OF POPLITEAL AND FEMORAL ARTERY ANEURYSM;  Surgeon: Conrad , MD;  Location: [facility];  Service: Vascular;  Laterality: Left;  . Abdominal aortagram N/A 09/27/2012    Procedure: ABDOMINAL Maxcine Ham;  Surgeon: Angelia Mould, MD;  Location[facility] CATH LAB;  Service: Cardiovascular;  Laterality: N/A;  . Tonsillectomy and adenoidectomy     Family History  Problem Relation Age of Onset  . Hypertension Mother   . Diabetes Mother   . Diabetes Brother    Social History:  reports that he quit smoking about 7 years ago. His smoking use included Pipe, Cigars, and Cigarettes. He quit after 50 years of use. He has never used smokeless tobacco. He reports that he drinks alcohol. He reports that he does not use illicit drugs. No Known Allergies Prior to Admission medications   Medication Sig Start Date End Date Taking? Authorizing Provider  Albuterol Sulfate 108 (90 BASE) MCG/ACT AEPB Inhale 2 puffs into the lungs every 4 (four) hours as needed.   Yes Historical Provider, MD  amLODipine (NORVASC) 5 MG tablet Take 5 mg by mouth daily.  02/01/15  Yes Historical Provider, MD  clopidogrel (PLAVIX) 75 MG tablet Take 1 tablet (75  mg total) by mouth daily. 06/02/14  Yes Conrad Empire, MD  lanthanum (FOSRENOL) 1000 MG chewable tablet Chew 1,000 mg by mouth 2 (two) times daily with a meal.   Yes Historical Provider, MD  metoprolol tartrate (LOPRESSOR) 25 MG tablet Take 25 mg by mouth 2 (two) times daily.   Yes Historical Provider, MD  multivitamin (RENA-VIT) TABS tablet Take 1 tablet by mouth daily.   Yes Historical Provider, MD  omeprazole (PRILOSEC) 40 MG capsule Take 40 mg by mouth daily.   Yes Historical Provider, MD  simvastatin (ZOCOR) 40 MG tablet Take 40 mg by mouth every evening.   Yes Historical  Provider, MD  benzonatate (TESSALON) 200 MG capsule Take 200 mg by mouth every 8 (eight) hours. 04/21/15   Historical Provider, MD   Current Facility-Administered Medications  Medication Dose Route Frequency Provider Last Rate Last Dose  . 0.9 %  sodium chloride infusion  250 mL Intravenous PRN Archie Patten, MD      . albuterol (PROVENTIL) (2.5 MG/3ML) 0.083% nebulizer solution 2.5 mg  2.5 mg Nebulization Q4H PRN Zenia Resides, MD      . amLODipine (NORVASC) tablet 5 mg  5 mg Oral Daily Archie Patten, MD   5 mg at 06/15/15 0931  . atorvastatin (LIPITOR) tablet 20 mg  20 mg Oral q1800 Wynell Balloon, RPH      . clopidogrel (PLAVIX) tablet 75 mg  75 mg Oral Daily Archie Patten, MD   75 mg at 06/15/15 0931  . [START ON 06/16/2015] dexamethasone (DECADRON) tablet 2 mg  2 mg Oral Daily Todd D McDiarmid, MD      . feeding supplement (ENSURE ENLIVE) (ENSURE ENLIVE) liquid 237 mL  237 mL Oral BID BM Donato Heinz, MD   237 mL at 06/15/15 1000  . gabapentin (NEURONTIN) capsule 100 mg  100 mg Oral BID Archie Patten, MD   100 mg at 06/15/15 0931  . heparin injection 5,000 Units  5,000 Units Subcutaneous 3 times per day Archie Patten, MD   5,000 Units at 06/15/15 0534  . [START ON 06/16/2015] Influenza vac split quadrivalent PF (FLUARIX) injection 0.5 mL  0.5 mL Intramuscular Tomorrow-1000 Donato Heinz, MD      . [facility]) chewable tablet 1,000 mg  1,000 mg Oral BID WC Archie Patten, MD   1,000 mg at 06/15/15 0931  . metoprolol tartrate (LOPRESSOR) tablet 25 mg  25 mg Oral BID Archie Patten, MD   25 mg at 06/15/15 0931  . morphine 4 MG/ML injection 6 mg  6 mg Intravenous Q4H PRN Blane Ohara McDiarmid, MD      . ondansetron (ZOFRAN) tablet 4 mg  4 mg Oral Q6H PRN Archie Patten, MD       Or  . ondansetron (ZOFRAN) injection 4 mg  4 mg Intravenous Q6H PRN Archie Patten, MD      . pantoprazole (PROTONIX) EC tablet 40 mg  40 mg Oral Daily Archie Patten, MD   40 mg  at 06/15/15 0931  . polyethylene glycol (MIRALAX / GLYCOLAX) packet 17 g  17 g Oral Daily PRN Archie Patten, MD      . senna-docusate (Senokot-S) tablet 2 tablet  2 tablet Oral QHS Todd D McDiarmid, MD      . sodium chloride 0.9 % injection 3 mL  3 mL Intravenous Q12H Archie Patten, MD   3 mL at 06/15/15 0932  . sodium chloride  0.9 % injection 3 mL  3 mL Intravenous Q12H Archie Patten, MD   3 mL at 06/15/15 0932  . sodium chloride 0.9 % injection 3 mL  3 mL Intravenous PRN Archie Patten, MD       Labs: Basic Metabolic Panel:  Recent Labs Lab 06/14/15 1132  NA 139  K 4.6  CL 98*  CO2 27  GLUCOSE 106*  BUN 46*  CREATININE 8.08*  CALCIUM 9.5   Liver Function Tests:  Recent Labs Lab 06/14/15 1132  AST 34  ALT 18  ALKPHOS 1072*  BILITOT 0.7  PROT 5.9*  ALBUMIN 3.0*   No results for input(s): LIPASE, AMYLASE in the last 168 hours. No results for input(s): AMMONIA in the last 168 hours. CBC:  Recent Labs Lab 06/14/15 1132  WBC 7.2  NEUTROABS 4.2  HGB 9.1*  HCT 29.2*  MCV 94.5  PLT 104*   Cardiac Enzymes: No results for input(s): CKTOTAL, CKMB, CKMBINDEX, TROPONINI in the last 168 hours. CBG: No results for input(s): GLUCAP in the last 168 hours. Iron Studies: No results for input(s): IRON, TIBC, TRANSFERRIN, FERRITIN in the last 72 hours. Studies/Results: Mr Lumbar Spine Wo Contrast  06/14/2015   CLINICAL DATA:  Back pain with radiculopathy and gait dysfunction.  EXAM: MRI LUMBAR SPINE WITHOUT CONTRAST  TECHNIQUE: Multiplanar, multisequence MR imaging of the lumbar spine was performed. No intravenous contrast was administered.  COMPARISON:  None.  FINDINGS: Transitional lumbosacral anatomy with rudimentary S1-S2 disc space.  Diffusely abnormal appearance of the marrow with confluent hypo intensity on T1 weighted imaging. Given chest CT findings 04/23/2015 and bulky pelvic and lower retroperitoneal adenopathy, prostate cancer is strongly favored. No  definitive extra osseous tumor to explain symptoms. 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  Hyperintensity at the level of the sacral ala and body is likely from field in homogeneity as on axial imaging there is no discrete fracture line or edema. Presacral edema which is nonspecific.  Normal conus signal and morphology.  Bilateral polycystic kidneys with atrophy, dialysis associated pattern.  Degenerative changes:  T12- L1: Unremarkable.  L1-L2: Unremarkable.  L2-L3: Mild spondylosis. Mild facet and ligament overgrowth. No impingement.  L3-L4: Mild disc bulging in the biforaminal regions. Facet and ligament overgrowth. No impingement.  L4-L5: Facet and ligament overgrowth which is moderate, with joint effusion on the right. Disc bulging, greater to the left. Mild to moderate biforaminal stenosis.  L5-S1:Disc narrowing with circumferential bulging. Bulky facet arthropathy with joint effusions. Biforaminal stenosis and L5 impingement. Subarticular recess effacement without discrete impingement.  IMPRESSION: 1. Diffuse osteoblastic metastatic disease with pelvic and periaortic adenopathy. The pattern suggests metastatic prostate cancer. 2. Advanced lower lumbar facet arthropathy with L5-S1 biforaminal stenosis and L5 impingement. 3.  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   Electronically Signed   By: Monte Fantasia M.D.   On: 06/14/2015 14:51    Review of Systems: Gen: reports good appetite but says has lost wt (EDW was 86.5kgs in January now 73.5kgs ) HEENT: No visual complaints, No history of Retinopathy. Normal external appearance No Epistaxis or Sore throat. No sinusitis.   CV: Denies chest pain, angina, palpitations, syncope, orthopnea, PND. Reports LE edema Resp: Denies dyspnea at rest, reports occassional dyspnea with exercise. Denies cough, sputum, wheezing, coughing up blood, and pleurisy. GI: Denies vomiting blood, jaundice, and  fecal incontinence.   Denies dysphagia or odynophagia. Denies abd pain. Denies blood in  stool  GU : Denies urinary burning, blood in urine, urinary frequency, urinary hesitancy, nocturnal urination, and urinary incontinence.  No renal calculi. MS: Reports severe back pain- improved with morphine Heme: Denies bruising, bleeding, and enlarged lymph nodes.   Physical Exam: Filed Vitals:   06/14/15 1930 06/14/15 2000 06/14/15 2048 06/15/15 0502  BP: 139/83 150/76 136/86 150/90  Pulse: 98 91 84 93  Temp:   97.8 F (36.6 C) 97.6 F (36.4 C)  TempSrc:   Oral Oral  Resp:   22 18  Height:   5\' 8"  (1.727 m)   Weight:   77.8 kg (171 lb 8.3 oz)   SpO2: 93% 97% 98% 99%     General: Well developed, well nourished, in no acute distress. Head: Normocephalic, atraumatic, sclera non-icteric, mucus membranes are moist Neck: Supple. JVD not elevated. Lungs: Faint bibasilar rhonchi. Breathing is unlabored. Heart: RRR with S1 S2. No murmurs, rubs, or gallops appreciated. Abdomen: Soft, non-tender, non-distended with normoactive bowel sounds. No rebound/guarding. No obvious abdominal masses. M-S:  LE weakness Lower extremities: +1 LE edema  Neuro: Alert and oriented X 3. Moves all extremities spontaneously. Psych:  Responds to questions appropriately with a normal affect. Dialysis Access:L AVF +b/t  Dialysis Orders:  TTS [facility] 4 hrs   73.5kgs   3K/2.25 Ca+    LAVF    5000uheparin hectorol 7- PTH 1505 micera 50 q 2 weeks- last dose 9/6   Assessment/Plan: 1.  Metastatic disease- suggestive of prostate origin- oncology following. IR consulted for bone biopsy.  Elevated PSA 494. Pain control. Decadron started. Recommend bicalutamide  2. LE weakness- cervical and thoracic spine MRI pending 3.  ESRD -  TTS [facility] via AVF, missed HD yesterday, HD pending today off schedule 4.  Hypertension/volume  - 150/90. Up 4kgs.LE edema- needs edw lowered again. Cont home meds amlodpine and metop.  5.  Anemia  - hgb 9.1  last micera dose 9/6 watch CBC. No Fe- last tsat 46 6.  Metabolic bone disease -  PTH 1505 and ptho 4.9 cont hectorol and binders. If memory serves he either cannot take or get sensipar 7.  Nutrition - alb 3. significant wt loss since January- down to 73.5 kgs from 86.5 kgs 8. afib 9. CHF   Shelle Iron, NP [facility] [phone] 06/15/2015, 11:34 AM   Patient seen and examined, agree with above note with above modifications. 74 year old BM well known to [place]- longstanding HD patient in [place]- had been having FTT lately now presents with back pain- looking like metastatic prostate CA.  Is comfortable now on HD- doing off schedule  Corliss Parish, MD 06/15/2015

## 2015-06-15 NOTE — Care Management Note (Signed)
Case Management Note  Patient Details  Name: Tim Kirby MRN: [id number] Date of Birth: 1941/08/10  Subjective/Objective:                 Patient lives at home with wife who "takes very good care of him." Patient still drives, wife still works as a Pharmacist, hospital at a private school in [place] after retiring from [employer] after 36 years. Patient states he has a walker at home. Patient has medicare and PCP, denies any barrier to care. Admitted with cachexia, prostate CA with Mets to back diagnosed this admission. Awaiting PT rec.   Action/Plan:  Will continue to follow  Expected Discharge Date:                  Expected Discharge Plan:  [place]  In-House Referral:     Discharge planning Services  CM Consult  Post Acute Care Choice:    Choice offered to:     DME Arranged:    DME Agency:     HH Arranged:    [place] Agency:     Status of Service:  In process, will continue to follow  Medicare Important Message Given:    Date Medicare IM Given:    Medicare IM give by:    Date Additional Medicare IM Given:    Additional Medicare Important Message give by:     If discussed at [place] of Stay Meetings, dates discussed:    Additional Comments:  Carles Collet, RN 06/15/2015, 12:01 PM

## 2015-06-15 NOTE — Progress Notes (Signed)
Patient ID: Tim Kirby, male   DOB: [birth year], 74 y.o.   MRN: [id number]   Request for bony metastasis biopsy for tissue diagnosis Presumed prostate cancer  Imaging reviewed with Dr Corrie Mckusick Rec:  Urology consult prior to Bone bx  Pt could possibly be candidate for bony met bx But would need off Plavix 5 days And Hep Inj held  Could be performed as OP if Urology /Oncology needs

## 2015-06-15 NOTE — Progress Notes (Signed)
ADDENDUM:  Patient's PSA is 494. This together with the MRI results mean we are almost certainly dealing with prostate cancer. A biopsy (most likely from a bone lesion) is still needed to confirm the diagnosis.  I have alerted my partner Dr Zola Button, who is our prostate cancer sub-specialist, regarding Tim Kirby situation. He will see the patient either today or tomorrow to discuss the diagnosis, prognosis and treatment options with the patient and his family.  Please let me know if I can be of further help at this point.

## 2015-06-15 NOTE — Progress Notes (Signed)
PT Cancellation Note  Patient Details Name: Tim Kirby MRN: [id number] DOB: 1941-08-31   Cancelled Treatment:    Reason Eval/Treat Not Completed: Patient at procedure or test/unavailable   [name] 06/15/2015, 3:19 PM  [facility] PT [phone]

## 2015-06-16 ENCOUNTER — Other Ambulatory Visit: Payer: Self-pay | Admitting: Pulmonary Disease

## 2015-06-16 DIAGNOSIS — C61 Malignant neoplasm of prostate: Secondary | ICD-10-CM

## 2015-06-16 DIAGNOSIS — Z992 Dependence on renal dialysis: Secondary | ICD-10-CM

## 2015-06-16 DIAGNOSIS — I739 Peripheral vascular disease, unspecified: Secondary | ICD-10-CM

## 2015-06-16 DIAGNOSIS — Z9889 Other specified postprocedural states: Secondary | ICD-10-CM

## 2015-06-16 DIAGNOSIS — I5030 Unspecified diastolic (congestive) heart failure: Secondary | ICD-10-CM

## 2015-06-16 DIAGNOSIS — I4891 Unspecified atrial fibrillation: Secondary | ICD-10-CM

## 2015-06-16 DIAGNOSIS — I132 Hypertensive heart and chronic kidney disease with heart failure and with stage 5 chronic kidney disease, or end stage renal disease: Secondary | ICD-10-CM

## 2015-06-16 DIAGNOSIS — D649 Anemia, unspecified: Secondary | ICD-10-CM

## 2015-06-16 DIAGNOSIS — K5900 Constipation, unspecified: Secondary | ICD-10-CM

## 2015-06-16 MED ORDER — BICALUTAMIDE 50 MG PO TABS: 50.0000 mg | ORAL_TABLET | Freq: Every day | ORAL | Status: DC

## 2015-06-16 MED ORDER — ALTEPLASE 2 MG IJ SOLR
2.0000 mg | Freq: Once | INTRAMUSCULAR | Status: DC | PRN
  Filled 2015-06-16: qty 2

## 2015-06-16 MED ORDER — HEPARIN SODIUM (PORCINE) 1000 UNIT/ML DIALYSIS
1000.0000 [IU] | INTRAMUSCULAR | Status: DC | PRN
  Filled 2015-06-16: qty 1

## 2015-06-16 MED ORDER — PENTAFLUOROPROP-TETRAFLUOROETH EX AERO: 1.0000 "application " | INHALATION_SPRAY | CUTANEOUS | Status: DC | PRN

## 2015-06-16 MED ORDER — GABAPENTIN 100 MG PO CAPS: 100.0000 mg | ORAL_CAPSULE | Freq: Two times a day (BID) | ORAL | Status: DC

## 2015-06-16 MED ORDER — DEXAMETHASONE 2 MG PO TABS: 2.0000 mg | ORAL_TABLET | Freq: Every day | ORAL | Status: DC

## 2015-06-16 MED ORDER — SODIUM CHLORIDE 0.9 % IV SOLN: 100.0000 mL | INTRAVENOUS | Status: DC | PRN

## 2015-06-16 MED ORDER — HEPARIN SODIUM (PORCINE) 1000 UNIT/ML DIALYSIS
5000.0000 [IU] | Freq: Once | INTRAMUSCULAR | Status: DC
  Filled 2015-06-16: qty 5

## 2015-06-16 MED ORDER — HYDROCODONE-ACETAMINOPHEN 5-325 MG PO TABS: 1.0000 | ORAL_TABLET | Freq: Four times a day (QID) | ORAL | Status: DC | PRN

## 2015-06-16 MED ORDER — BICALUTAMIDE 50 MG PO TABS
50.0000 mg | ORAL_TABLET | Freq: Every day | ORAL | Status: DC
  Administered 2015-06-16 – 2015-06-17 (×2): 50 mg via ORAL
  Filled 2015-06-16 (×2): qty 1

## 2015-06-16 MED ORDER — LIDOCAINE HCL (PF) 1 % IJ SOLN
5.0000 mL | INTRAMUSCULAR | Status: DC | PRN
  Filled 2015-06-16: qty 5

## 2015-06-16 MED ORDER — LIDOCAINE-PRILOCAINE 2.5-2.5 % EX CREA
1.0000 "application " | TOPICAL_CREAM | CUTANEOUS | Status: DC | PRN
  Filled 2015-06-16: qty 5

## 2015-06-16 NOTE — Discharge Instructions (Signed)
Back Pain, Adult HOME CARE   Stay active. Start with short walks on flat ground if you can. Try to walk farther each day.  Do not sit, drive, or stand in one place for more than 30 minutes. Do not stay in bed.  Do not avoid exercise or work. Activity can help your back heal faster.  Be careful when you bend or lift an object. Bend at your knees, keep the object close to you, and do not twist.  Sleep on a firm mattress. Lie on your side, and bend your knees. If you lie on your back, put a pillow under your knees.  Only take medicines as told by your doctor.  Put ice on the injured area.  Put ice in a plastic bag.  Place a towel between your skin and the bag.  Leave the ice on for 15-20 minutes, 03-04 times a day for the first 2 to 3 days. After that, you can switch between ice and heat packs.  Ask your doctor about back exercises or massage.  Avoid feeling anxious or stressed. Find good ways to deal with stress, such as exercise. GET HELP RIGHT AWAY IF:   Your pain does not go away with rest or medicine.  Your pain does not go away in 1 week.  You have new problems.  You do not feel well.  The pain spreads into your legs.  You cannot control when you poop (bowel movement) or pee (urinate).  Your arms or legs feel weak or lose feeling (numbness).  You feel sick to your stomach (nauseous) or throw up (vomit).  You have belly (abdominal) pain.  You feel like you may pass out (faint). MAKE SURE YOU:   Understand these instructions.  Will watch your condition.  Will get help right away if you are not doing well or get worse. Document Released: 03/10/2008 Document Revised: 12/15/2011 Document Reviewed: 01/24/2014 [facility] Patient Information 2015 [place], [place]. This information is not intended to replace advice given to you by your health care provider. Make sure you discuss any questions you have with your health care provider.

## 2015-06-16 NOTE — Progress Notes (Signed)
  [facility] Progress Note   Subjective: no complaints  Filed Vitals:   06/15/15 1820 06/15/15 2121 06/16/15 0534 06/16/15 0931  BP: 138/89 130/87 158/88 160/72  Pulse: 98 100 91   Temp: 98 F (36.7 C) 98.5 F (36.9 C) 97.3 F (36.3 C)   TempSrc: Oral Oral Oral   Resp: 18 18 18    Height:      Weight: 73 kg (160 lb 15 oz)     SpO2: 99% 100% 100%    Exam: Alert no distress, a bit frail No jvd Chest clear bilat RRR no MRG Abd soft ntnd no mass or ascites Trace LE edema Neuro is alert, ox 3, nf L arm AVF +bruit  TTS [facility]   4 hrs 73.5kgs 3K/2.25 Ca+ LAVF 5000u heparin hectorol 7- PTH 1505 micera 50 q 2 weeks- last dose 9/6       Assessment: 1. Back pain/ bony mets/ LE weakness- prob prostate Ca per oncology, needs biopsy 2. ESRD HD tts 3. HTN/ vol is at dry wt, cont MTP/ amlod 4. Anemiacont esa 5. Metabolic bone disease - PTH 1505 and ptho 4.9 cont hectorol and binders. If memory serves he either cannot take or get sensipar 6. Nutrition - alb 3. significant wt loss since January- down to 73.5 kgs from 86.5 kgs  Plan - HD today     Kelly Splinter MD  pager [phone]    cell [phone]  06/16/2015, 10:36 AM     Recent Labs Lab 06/14/15 1132 06/15/15 1902  NA 139 138  K 4.6 4.0  CL 98* 96*  CO2 27 23  GLUCOSE 106* 124*  BUN 46* 17  CREATININE 8.08* 4.49*  CALCIUM 9.5 9.4  PHOS  --  4.1    Recent Labs Lab 06/14/15 1132 06/15/15 1902  AST 34  --   ALT 18  --   ALKPHOS 1072*  --   BILITOT 0.7  --   PROT 5.9*  --   ALBUMIN 3.0* 3.6    Recent Labs Lab 06/14/15 1132 06/15/15 1902  WBC 7.2 8.0  NEUTROABS 4.2  --   HGB 9.1* 10.6*  HCT 29.2* 33.3*  MCV 94.5 94.3  PLT 104* 98*   . amLODipine  5 mg Oral Daily  . atorvastatin  20 mg Oral q1800  . clopidogrel  75 mg Oral Daily  . dexamethasone  2 mg Oral Daily  . doxercalciferol  7 mcg Intravenous Q T,Th,Sa-HD  . feeding supplement (NEPRO CARB STEADY)  237 mL Oral BID  BM  . gabapentin  100 mg Oral BID  . heparin  5,000 Units Subcutaneous 3 times per day  . heparin  5,000 Units Dialysis Once in dialysis  . Influenza vac split quadrivalent PF  0.5 mL Intramuscular Tomorrow-1000  . lanthanum  1,000 mg Oral BID WC  . metoprolol tartrate  25 mg Oral BID  . multivitamin  1 tablet Oral QHS  . pantoprazole  40 mg Oral Daily  . senna-docusate  2 tablet Oral QHS  . sodium chloride  3 mL Intravenous Q12H  . sodium chloride  3 mL Intravenous Q12H     sodium chloride, sodium chloride, sodium chloride, albuterol, alteplase, heparin, lidocaine (PF), lidocaine-prilocaine, morphine injection, ondansetron **OR** ondansetron (ZOFRAN) IV, pentafluoroprop-tetrafluoroeth, polyethylene glycol, sodium chloride

## 2015-06-16 NOTE — Care Management Note (Signed)
Case Management Note  Patient Details  Name: [name] MRN: [id number] Date of Birth: 20-Apr-1941  Subjective/Objective:                  Back pain, bilateral leg weakness  Action/Plan:   Expected Discharge Date:                  Expected Discharge Plan:  [place]  In-House Referral:     Discharge planning Services  CM Consult  Post Acute Care Choice:  Home Health Choice offered to:     DME Arranged:  N/A DME Agency:     HH Arranged:    [place] Agency:     Status of Service:  In process, will continue to follow  Medicare Important Message Given:    Date Medicare IM Given:    Medicare IM give by:    Date Additional Medicare IM Given:    Additional Medicare Important Message give by:     If discussed at [place] of Stay Meetings, dates discussed:    Additional Comments: CM spoke with patient at the bedside. Patient has a RW at home. Patient provided with a home care list for HHPT. Will review the list with his wife and select an agency.  Apolonio Schneiders, RN 06/16/2015, 12:18 PM

## 2015-06-16 NOTE — Progress Notes (Signed)
Physical Therapy Evaluation Patient Details Name: Tim Kirby MRN: [id number] DOB: [birth year] Today's Date: 06/16/2015   History of Present Illness  74 yo male admitted for severe LBP, on HD T,Th,S at home, prostate cancer with mets to back  Clinical Impression  Patient just received dose of pain medication, working well.  Requires assist for bed mobility due to LE weakness, but once out of bed, demonstrates Supervision mobility using RW.  Pain managed at this time, may need increased assist with pain worsening.  Patient will benefit from PT services for LE strengthening, pain reduction strategies, and to increase functional mobility, and is able to receive services at home level for current functional mobility.  Patient will remain on caseload while in hospital however, waiting for further workup/biopsy medically.    Follow Up Recommendations Home health PT;Supervision - Intermittent    Equipment Recommendations  None recommended by PT (Patient has RW at home)    Recommendations for Other Services       Precautions / Restrictions Precautions Precautions: Fall Precaution Comments: L UE dialysis access site Restrictions Weight Bearing Restrictions: No      Mobility  Bed Mobility Overal bed mobility: Needs Assistance Bed Mobility: Supine to Sit;Sit to Supine     Supine to sit: Min assist Sit to supine: Min assist   General bed mobility comments: Assist to pull up to sitting, and lift legs getting back in.  Transfers Overall transfer level: Needs assistance Equipment used: Rolling walker (2 wheeled) Transfers: Sit to/from Stand Sit to Stand: Min guard         General transfer comment: OK once standing  Ambulation/Gait Ambulation/Gait assistance: Supervision Ambulation Distance (Feet): 150 Feet Assistive device: Rolling walker (2 wheeled) Gait Pattern/deviations: Decreased stride length   Gait velocity interpretation: Below normal speed for age/gender     Stairs            Wheelchair Mobility    Modified Rankin (Stroke Patients Only)       Balance Overall balance assessment: Needs assistance   Sitting balance-Leahy Scale: Good       Standing balance-Leahy Scale: Fair                               Pertinent Vitals/Pain Pain Assessment: 0-10 Pain Score: 3  Pain Location: Low back Pain Intervention(s): Premedicated before session;Monitored during session;Limited activity within patient's tolerance;Repositioned    Home Living Family/patient expects to be discharged to:: Private residence Living Arrangements: Spouse/significant other Available Help at Discharge: Family;Available 24 hours/day Type of Home: House Home Access: Stairs to enter Entrance Stairs-Rails: None Entrance Stairs-Number of Steps: 1 Home Layout: One level        Prior Function Level of Independence: Independent         Comments: Working at [employer] in [employer]        Extremity/Trunk Assessment   Upper Extremity Assessment: Overall WFL for tasks assessed;Defer to OT evaluation           Lower Extremity Assessment: Generalized weakness;RLE deficits/detail RLE Deficits / Details: Hx aneurysm and surgery with scars       Communication   Communication: No difficulties  Cognition Arousal/Alertness: Awake/alert Behavior During Therapy: WFL for tasks assessed/performed Overall Cognitive Status: Within Functional Limits for tasks assessed                      General Comments  Exercises        Assessment/Plan    PT Assessment All further PT needs can be met in the next venue of care  PT Diagnosis Difficulty walking;Acute pain;Generalized weakness   PT Problem List Decreased strength;Decreased mobility;Decreased activity tolerance;Pain  PT Treatment Interventions     PT Goals (Current goals can be found in the Care Plan section) Acute Rehab PT Goals Patient Stated Goal: Not  stated, to go home. PT Goal Formulation: With patient Time For Goal Achievement: 06/30/15 Potential to Achieve Goals: Good    Frequency     Barriers to discharge        Co-evaluation               End of Session Equipment Utilized During Treatment: Gait belt Activity Tolerance: Patient tolerated treatment well;No increased pain Patient left: in bed;with call bell/phone within reach;with bed alarm set;with family/visitor present Nurse Communication: Mobility status         Time: 1120-1145 PT Time Calculation (min) (ACUTE ONLY): 25 min   Charges:   PT Evaluation $Initial PT Evaluation Tier I: 1 Procedure PT Treatments $Therapeutic Activity: 8-22 mins   PT G Codes:        Zaharah Amir L July 05, 2015, 11:51 AM

## 2015-06-16 NOTE — Progress Notes (Signed)
Patient seen and examined. Case d/w residents in detail. I agree with findings and plan as documented in Dr. Marjory Sneddon note.  Pain better controlled today. Will transition to oral pain medications today. Patient will need biopsy of bony mets - to be done as an outpatient (needs to be off plavix for 5 days). Patient with likely metastatic prostate cancer. Resident discussed with urology who agrees with outpatient follow up. C/w HD per nephrology

## 2015-06-16 NOTE — Progress Notes (Signed)
Family Medicine Teaching Service Daily Progress Note Intern Pager: [phone]  [name] Medical record number: [id number] Date of birth: 02-20-1941 Age: 74 y.o. Gender: male  Primary Care Provider: Maris Berger, MD Consultants: Mariana Kaufman, Urology  Code Status: Full  Pt Overview and Major Events to Date:  9/8: Admitted; MRI Lumbar Spine--diffuse osteoblastic metastatic disease with pelvic and periaortic adenopathy suggestive of metastatic prostate CA; PSA 494  Assessment and Plan: Tim Kirby is a 74 y.o. male presenting with back pain and bilateral leg weakness for the last 3 months. MRI showing diffuse metastatic disease, thought to be prostate in origin. Admitted for pain control. PMH is significant for HTN, ESRD on dialysis, PVD, HLD, CHF (last ECHO 3/30: EF 55%, moderate LVH, moderate diastolic dysfunction), secondary hyperparathyroidism, anemia, gout.  1. Metastatic prostate cancer: MRI showing diffuse osteoblastic metastatic disease with pelvic and periaortic adenopathy, pattern suggestive of metastatic prostate cancer. Labs significant for alk phos of 1072. Patient noted a 30-35lb weight loss, he weighed 179lbs at an appt in 01/2015 with a current weight of 165lb concerning for malignancy as well.PSA 494.  - Oncology following, appreciate recommendations. Bicalutamide 44m daily for 30 days total. - IR consulted for bone biopsy. Will have biopsy done as outpatient as he needs to be off of blood thinners for 5 days. - Pain meds: gabapentin for radicular pain. Transition morphine to Norco 5/3254m1-2 tablets q6hrs - Discussed with urology, Dr. MaTresa Moorewho agrees with plan and recommends outpatient follow up.  2. Atrial Fibrillation: CHADVASC score is 4. EKG in ED demonstrated Afib.  - Continue home meds: Metoprolol tartrate - Given high probability of malignancy, patient is at an increased risk of clot formation- consider discussing anticoagulation at a later  time - Repeat EKG pending  3. ESRD on Dialysis: T/Th/Sat dialysis. - Nephro following, appreciate recommendations. Dialysis today  - Continue fosrenol   4. HTN - Continue home meds: Norvasc, Metoprolol  5. HLD - Continue home med: Zocor 4059m Given patient's ASCVD score, could consider high intensity statin however given current circumstances and overall general health, will continue Zocor for now  6. CHF (diastolic): Last ECHO (3/39/03/00EF 55%, moderate LVH, moderate diastolic dysfunction. Patient noted a previous dry weight of 190 but notes he is now 165 due to unintentional weight loss. Does not appear to be volume overloaded. - Continue home med: Metoprolol  7. Constipation - Colace, Miralax  8. PAD: Patient is s/p right femoral to below knee popliteal bypass graft with popliteal aneurysm ligation on 10/18/2012 and left popliteal artery stent for aneurysm on 12/29/2012. He also has a right common iliac aneurysm followed by Dr. CheBridgett Larsson continue Plavix   9. Anemia: Hgb stable at 9.1 on admission. Given ESRD, likely anemia of chronic disease. - continue to monitor  FEN/GI: Protonix, IV saline lock Prophylaxis: Heparin  Disposition: home pending pain control   Subjective:  No acute events overnight. Reports his pain is controlled on IV pain meds. Remains in good spirits. No complaints otherwise. Tolerating diet.   Objective: Temp:  [97.3 F (36.3 C)-98.5 F (36.9 C)] 97.3 F (36.3 C) (09/10 0534) Pulse Rate:  [80-100] 91 (09/10 0534) Resp:  [15-18] 18 (09/10 0534) BP: (113-160)/(70-89) 160/72 mmHg (09/10 0931) SpO2:  [99 %-100 %] 100 % (09/10 0534) Weight:  [160 lb 15 oz (73 kg)-167 lb 8.8 oz (76 kg)] 160 lb 15 oz (73 kg) (09/09 1820)  Physical Exam: General: NAD  Cardiovascular: Irregular rhythm, normal  rate, no murmurs appreciated Respiratory: Mild crackles in lung bases bilaterally. Normal WOB.  Abdomen: +BS, soft, NTND Extremities: 1+ edema to R LE, trace edema  to L LE, weakness to LE R>L Psych: Appropriate mood and affect.   Laboratory:  Recent Labs Lab 06/14/15 1132 06/15/15 1902  WBC 7.2 8.0  HGB 9.1* 10.6*  HCT 29.2* 33.3*  PLT 104* 98*    Recent Labs Lab 06/14/15 1132 06/15/15 1902  NA 139 138  K 4.6 4.0  CL 98* 96*  CO2 27 23  BUN 46* 17  CREATININE 8.08* 4.49*  CALCIUM 9.5 9.4  PROT 5.9*  --   BILITOT 0.7  --   ALKPHOS 1072*  --   ALT 18  --   AST 34  --   GLUCOSE 106* 124*      Ref Range 1d ago    PSA 0.00 - 4.00 ng/mL 494.00 (H)         Imaging/Diagnostic Tests: Mr Cervical Spine Wo Contrast  06/16/2015   CLINICAL DATA:  Initial evaluation for weakness.  EXAM: MRI CERVICAL AND THORACIC SPINE WITHOUT CONTRAST  TECHNIQUE: Multiplanar and multiecho pulse sequences of the cervical spine, to include the craniocervical junction and cervicothoracic junction, and lumbar spine, were obtained without intravenous contrast.  COMPARISON:  MRI of the lumbar spine performed on 06/14/2015.  FINDINGS: MRI CERVICAL SPINE FINDINGS  Visualized portions of the brain demonstrate cerebral and cerebellar atrophy. Craniocervical junction widely patent.  There is 2 mm anterolisthesis of C7 on T1. Otherwise, vertebral bodies are normally aligned with preservation of the normal cervical lordosis. Vertebral body heights preserved. No fracture.  Diffuse abnormal signal intensity seen throughout the visualized the vertebral body bone marrow, suspicious for diffuse osseous metastases. Findings are similar relative to those seen in the lumbar spine. No associated pathologic fracture. No extraosseous tumor.  Signal intensity within the cervical spinal cord is normal.  Visualized paraspinous soft tissues within normal limits. No prevertebral edema.  C2-C3: Mild bilateral uncovertebral spurring with disc bulge. Degenerative endplate changes present. Very mild left foraminal narrowing. No significant canal or right foraminal stenosis.  C3-C4: Mild  bilateral uncovertebral spurring with facet arthrosis. Broad-based posterior disc protrusion. There is resultant moderate left foraminal narrowing. Right neural foramen widely patent. Very mild canal stenosis related to the disc protrusion.  C4-C5: Left greater than right uncovertebral hypertrophy with mild facet arthrosis. Mild diffuse disc bulge. There is resultant mild bilateral foraminal narrowing. Central canal remains widely patent.  C5-C6: Mild bilateral uncovertebral spurring. Minimal facet hypertrophy. There is resultant very mild left foraminal narrowing. No significant right foraminal or central canal stenosis.  C6-C7: Bilateral uncovertebral hypertrophy with facet arthrosis. Minimal disc bulge. No significant canal stenosis. Foramina remain widely patent.  C7-T1: 2 mm anterolisthesis of C7 on T1. Associated disc bulge with mild bilateral uncovertebral hypertrophy. No significant canal stenosis. Mild bilateral foraminal narrowing P  MRI THORACIC SPINE FINDINGS  Study is degraded by motion artifact.  Vertebral bodies are normally aligned with preservation of the normal thoracic kyphosis. Vertebral body heights are preserved. No fracture.  Diffusely abnormal signal intensity seen throughout the vertebral body bone marrow, worrisome for widespread osseous metastatic disease. No associated pathologic fracture. Minimal posterior convex bulging of the T5, T6, T7, and T8 vertebral bodies without significant extraosseous tumor. No marrow edema.  Signal intensity within the thoracic spinal cord is normal.  No acute paraspinous soft tissue abnormality. Bilateral pleural effusions noted, right larger than left. Multiple cystic lesions noted within  the partially visualized kidneys.  Degenerative disc bulge noted at the C7-T1 level. No other significant degenerative disc bulging present within the visualized thoracic spine. Mild facet arthrosis within the lower lumbar spine. No significant canal or foraminal stenosis.  No focal disc herniation.  IMPRESSION: MRI CERVICAL IMPRESSION:  1. 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 No pathologic fracture or other complication. 2. 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 No significant canal or foraminal stenosis within the cervical spine.  MRI THORACIC IMPRESSION:  1. 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 No pathologic fracture or other complication identified. 2. No significant stenosis within the thoracic spine. 3. Bilateral pleural effusions, right larger than left.   Electronically Signed   By: Jeannine Boga M.D.   On: 06/16/2015 01:55   Mr Thoracic Spine Wo Contrast  06/16/2015   CLINICAL DATA:  Initial evaluation for weakness.  EXAM: MRI CERVICAL AND THORACIC SPINE WITHOUT CONTRAST  TECHNIQUE: Multiplanar and multiecho pulse sequences of the cervical spine, to include the craniocervical junction and cervicothoracic junction, and lumbar spine, were obtained without intravenous contrast.  COMPARISON:  MRI of the lumbar spine performed on 06/14/2015.  FINDINGS: MRI CERVICAL SPINE FINDINGS  Visualized portions of the brain demonstrate cerebral and cerebellar atrophy. Craniocervical junction widely patent.  There is 2 mm anterolisthesis of C7 on T1. Otherwise, vertebral bodies are normally aligned with preservation of the normal cervical lordosis. Vertebral body heights preserved. No fracture.  Diffuse abnormal signal intensity seen throughout the visualized the vertebral body bone marrow, suspicious for diffuse osseous metastases. Findings are similar relative to those seen in the lumbar spine. No associated pathologic fracture. No extraosseous tumor.  Signal intensity within the cervical spinal cord is normal.  Visualized paraspinous soft tissues within normal limits. No prevertebral edema.   C2-C3: Mild bilateral uncovertebral spurring with disc bulge. Degenerative endplate changes present. Very mild left foraminal narrowing. No significant canal or right foraminal stenosis.  C3-C4: Mild bilateral uncovertebral spurring with facet arthrosis. Broad-based posterior disc protrusion. There is resultant moderate left foraminal narrowing. Right neural foramen widely patent. Very mild canal stenosis related to the disc protrusion.  C4-C5: Left greater than right uncovertebral hypertrophy with mild facet arthrosis. Mild diffuse disc bulge. There is resultant mild bilateral foraminal narrowing. Central canal remains widely patent.  C5-C6: Mild bilateral uncovertebral spurring. Minimal facet hypertrophy. There is resultant very mild left foraminal narrowing. No significant right foraminal or central canal stenosis.  C6-C7: Bilateral uncovertebral hypertrophy with facet arthrosis. Minimal disc bulge. No significant canal stenosis. Foramina remain widely patent.  C7-T1: 2 mm anterolisthesis of C7 on T1. Associated disc bulge with mild bilateral uncovertebral hypertrophy. No significant canal stenosis. Mild bilateral foraminal narrowing P  MRI THORACIC SPINE FINDINGS  Study is degraded by motion artifact.  Vertebral bodies are normally aligned with preservation of the normal thoracic kyphosis. Vertebral body heights are preserved. No fracture.  Diffusely abnormal signal intensity seen throughout the vertebral body bone marrow, worrisome for widespread osseous metastatic disease. No associated pathologic fracture. Minimal posterior convex bulging of the T5, T6, T7, and T8 vertebral bodies without significant extraosseous tumor. No marrow edema.  Signal intensity within the thoracic spinal cord is normal.  No acute paraspinous soft tissue abnormality. Bilateral pleural effusions noted, right larger than left. Multiple cystic lesions noted within the partially visualized kidneys.  Degenerative disc bulge noted at the  C7-T1 level. No other significant  degenerative disc bulging present within the visualized thoracic spine. Mild facet arthrosis within the lower lumbar spine. No significant canal or foraminal stenosis. No focal disc herniation.  IMPRESSION: MRI CERVICAL IMPRESSION:  1. 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 No pathologic fracture or other complication. 2. 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 No significant canal or foraminal stenosis within the cervical spine.  MRI THORACIC IMPRESSION:  1. 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 No pathologic fracture or other complication identified. 2. No significant stenosis within the thoracic spine. 3. Bilateral pleural effusions, right larger than left.   Electronically Signed   By: Jeannine Boga M.D.   On: 06/16/2015 01:55   Mr Lumbar Spine Wo Contrast  06/14/2015   CLINICAL DATA:  Back pain with radiculopathy and gait dysfunction.  EXAM: MRI LUMBAR SPINE WITHOUT CONTRAST  TECHNIQUE: Multiplanar, multisequence MR imaging of the lumbar spine was performed. No intravenous contrast was administered.  COMPARISON:  None.  FINDINGS: Transitional lumbosacral anatomy with rudimentary S1-S2 disc space.  Diffusely abnormal appearance of the marrow with confluent hypo intensity on T1 weighted imaging. Given chest CT findings 04/23/2015 and bulky pelvic and lower retroperitoneal adenopathy, prostate cancer is strongly favored. No definitive extra osseous tumor to explain symptoms. 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  Hyperintensity at the level of the sacral ala and body is likely from field in homogeneity as on axial imaging there is no discrete fracture line or edema. Presacral edema which is nonspecific.  Normal conus signal and morphology.  Bilateral  polycystic kidneys with atrophy, dialysis associated pattern.  Degenerative changes:  T12- L1: Unremarkable.  L1-L2: Unremarkable.  L2-L3: Mild spondylosis. Mild facet and ligament overgrowth. No impingement.  L3-L4: Mild disc bulging in the biforaminal regions. Facet and ligament overgrowth. No impingement.  L4-L5: Facet and ligament overgrowth which is moderate, with joint effusion on the right. Disc bulging, greater to the left. Mild to moderate biforaminal stenosis.  L5-S1:Disc narrowing with circumferential bulging. Bulky facet arthropathy with joint effusions. Biforaminal stenosis and L5 impingement. Subarticular recess effacement without discrete impingement.  IMPRESSION: 1. Diffuse osteoblastic metastatic disease with pelvic and periaortic adenopathy. The pattern suggests metastatic prostate cancer. 2. Advanced lower lumbar facet arthropathy with L5-S1 biforaminal stenosis and L5 impingement. 3.  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   Electronically Signed   By: Monte Fantasia M.D.   On: 06/14/2015 14:51   EKG (9/8): atrial fibrillation, HR 73 nonspecific ST and T wave abnormalities without significant change from previous EKGs  Milagros Loll, MD 06/16/2015, 1:53 PM PGY-2, [facility] Intern pager: [phone], text pages welcome

## 2015-06-17 DIAGNOSIS — C7951 Secondary malignant neoplasm of bone: Secondary | ICD-10-CM | POA: Diagnosis not present

## 2015-06-17 MED ORDER — DOXERCALCIFEROL 4 MCG/2ML IV SOLN
INTRAVENOUS | Status: AC
  Filled 2015-06-17: qty 4

## 2015-06-17 MED ORDER — HYDROCODONE-ACETAMINOPHEN 5-325 MG PO TABS: 1.0000 | ORAL_TABLET | Freq: Four times a day (QID) | ORAL | Status: DC | PRN

## 2015-06-17 NOTE — Progress Notes (Signed)
CM spoke with pt who chooses [facility].  Pt's physical address is [place] [address] [phone].  Referral called to [facility] and Janae requested CM fax facesheet with appropriate address and contact information, orders, F2F, H&P, DC/last progress note and PT EVAL note to [phone].  No other CM needs were communicated.

## 2015-06-17 NOTE — Progress Notes (Signed)
Patient discharge teaching given, including activity, diet, follow-up appoints, and medications. Patient verbalized understanding of all discharge instructions. IV access was d/c'd. Vitals are stable. Skin is intact except as charted in most recent assessments. Pt to be escorted out by NT, to be driven home by family. 

## 2015-06-17 NOTE — Progress Notes (Signed)
Patient seen and examined. Case d/w residents in detail. I agree with findings and plan as documneted in Dr. Marjory Sneddon note.  Patient's pain is well controlled. Will c/w prn vicodin. Patient stable for d.c home. Will need outpatient biopsy with IR of bony met likely prostate cancer. Will need outpatient urology f/u next week.

## 2015-06-17 NOTE — Progress Notes (Signed)
Physical Therapy Treatment Patient Details Name: Tim Kirby MRN: [id number] DOB: 22-Feb-1941 Today's Date: 2015-06-27    History of Present Illness 74 yo male admitted for severe LBP, on HD T,Th,S at home, prostate cancer with mets to back    PT Comments    Pt making good progress and is returning home today.  Follow Up Recommendations  Home health PT;Supervision - Intermittent     Equipment Recommendations  None recommended by PT (Patient has RW at home)    Recommendations for Other Services       Precautions / Restrictions Precautions Precautions: Fall Precaution Comments: L UE dialysis access site Restrictions Weight Bearing Restrictions: No    Mobility  Bed Mobility Overal bed mobility: Needs Assistance Bed Mobility: Supine to Sit;Sit to Supine     Supine to sit: Supervision;HOB elevated Sit to supine: Min assist   General bed mobility comments: Assist to bring legs back up into bed  Transfers Overall transfer level: Needs assistance Equipment used: Rolling walker (2 wheeled) Transfers: Sit to/from Stand Sit to Stand: Supervision            Ambulation/Gait Ambulation/Gait assistance: Supervision Ambulation Distance (Feet): 200 Feet Assistive device: Rolling walker (2 wheeled) Gait Pattern/deviations: Step-through pattern;Decreased stride length   Gait velocity interpretation: Below normal speed for age/gender     Stairs            Wheelchair Mobility    Modified Rankin (Stroke Patients Only)       Balance     Sitting balance-Leahy Scale: Good       Standing balance-Leahy Scale: Fair                      Cognition Arousal/Alertness: Awake/alert Behavior During Therapy: WFL for tasks assessed/performed Overall Cognitive Status: Within Functional Limits for tasks assessed                      Exercises      General Comments        Pertinent Vitals/Pain Pain Assessment: No/denies pain    Home  Living                      Prior Function            PT Goals (current goals can now be found in the care plan section) Acute Rehab PT Goals Patient Stated Goal: Not stated, to go home. PT Goal Formulation: With patient Time For Goal Achievement: 06/30/15 Potential to Achieve Goals: Good Progress towards PT goals: Progressing toward goals    Frequency  Min 3X/week    PT Plan Current plan remains appropriate    Co-evaluation             End of Session   Activity Tolerance: Patient tolerated treatment well Patient left: in bed;with call bell/phone within reach;with family/visitor present     Time: 1053-1101 PT Time Calculation (min) (ACUTE ONLY): 8 min  Charges:  $Gait Training: 8-22 mins                    G Codes:      Rhiannon Sassaman June 27, 2015, 11:12 AM  Suanne Marker PT [phone]

## 2015-06-17 NOTE — Progress Notes (Signed)
Subjective:   Says possible DC today- pain controlled. Biopsy to be done as outpt  Objective Filed Vitals:   06/17/15 0200 06/17/15 0222 06/17/15 0536 06/17/15 1004  BP: 106/84 122/80 139/81 132/69  Pulse: 90 91 79 83  Temp:  98.6 F (37 C) 97.9 F (36.6 C)   TempSrc:  Oral Oral   Resp:  20 18   Height:      Weight:  72 kg (158 lb 11.7 oz)    SpO2:  99% 98%    Physical Exam General: alert and oriented. No acute distress  Heart: RRR Lungs: CTA, unlabored Abdomen: soft, nontender +Bs Extremities: trace LE edema Dialysis Access: L AVF +b/t   Dialysis Orders: [facility] 4 hrs 73.5kgs 3K/2.25 Ca+ LAVF 5000uheparin hectorol 7- PTH 1505 micera 50 q 2 weeks- last dose 9/6   Assessment/Plan: 1. Metastatic disease- suggestive of prostate origin- oncology following. IR consulted for bone biopsy- to be done outpt. Elevated PSA 494. Pain control. Decadron started.  bicalutamide  2. LE weakness- cervical and thoracic spine MRI- Diffuse osteoblastic metastatic disease  3. ESRD - [facility] via AVF, next HD tuesday 4. Hypertension/volume - 132/69 under edw- needs to be lowered at DC Cont home meds amlodpine and metop.  5. Anemia - hgb 10.6 last micera dose 9/6 watch CBC. No Fe- last tsat 46 6. 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 Nutrition - alb 3.6 significant wt loss since January- down to 73.5 kgs from 86.5 kgs 8. afib 9. CHF 10. Flu vaccine given in [place], NP [place] [phone] 06/17/2015,10:43 AM  LOS: 3 days   Pt seen, examined and agree w A/P as above.  Kelly Splinter MD pager [phone]    cell [phone] 06/17/2015, 11:38 AM    Additional Objective Labs: Basic Metabolic Panel:  Recent Labs Lab 06/14/15 1132 06/15/15 1902  NA 139 138  K 4.6 4.0  CL 98* 96*  CO2 27 23  GLUCOSE 106* 124*  BUN 46* 17  CREATININE 8.08* 4.49*  CALCIUM 9.5 9.4  PHOS  --   4.1   Liver Function Tests:  Recent Labs Lab 06/14/15 1132 06/15/15 1902  AST 34  --   ALT 18  --   ALKPHOS 1072*  --   BILITOT 0.7  --   PROT 5.9*  --   ALBUMIN 3.0* 3.6   No results for input(s): LIPASE, AMYLASE in the last 168 hours. CBC:  Recent Labs Lab 06/14/15 1132 06/15/15 1902  WBC 7.2 8.0  NEUTROABS 4.2  --   HGB 9.1* 10.6*  HCT 29.2* 33.3*  MCV 94.5 94.3  PLT 104* 98*   Blood Culture    Component Value Date/Time   SDES BLOOD RIGHT WRIST 04/27/2015 0654   SPECREQUEST BOTTLES DRAWN AEROBIC AND ANAEROBIC 5CC 04/27/2015 0654   CULT NO GROWTH 5 DAYS 04/27/2015 0654   REPTSTATUS 05/02/2015 FINAL 04/27/2015 0654    Cardiac Enzymes: No results for input(s): CKTOTAL, CKMB, CKMBINDEX, TROPONINI in the last 168 hours. CBG: No results for input(s): GLUCAP in the last 168 hours. Iron Studies: No results for input(s): IRON, TIBC, TRANSFERRIN, FERRITIN in the last 72 hours. @lablastinr3 @ Studies/Results: Mr Cervical Spine Wo Contrast  06/16/2015   CLINICAL DATA:  Initial evaluation for weakness.  EXAM: MRI CERVICAL AND THORACIC SPINE WITHOUT CONTRAST  TECHNIQUE: Multiplanar and multiecho pulse sequences of the cervical spine, to include the craniocervical junction and  cervicothoracic junction, and lumbar spine, were obtained without intravenous contrast.  COMPARISON:  MRI of the lumbar spine performed on 06/14/2015.  FINDINGS: MRI CERVICAL SPINE FINDINGS  Visualized portions of the brain demonstrate cerebral and cerebellar atrophy. Craniocervical junction widely patent.  There is 2 mm anterolisthesis of C7 on T1. Otherwise, vertebral bodies are normally aligned with preservation of the normal cervical lordosis. Vertebral body heights preserved. No fracture.  Diffuse abnormal signal intensity seen throughout the visualized the vertebral body bone marrow, suspicious for diffuse osseous metastases. Findings are similar relative to those seen in the lumbar spine. No associated  pathologic fracture. No extraosseous tumor.  Signal intensity within the cervical spinal cord is normal.  Visualized paraspinous soft tissues within normal limits. No prevertebral edema.  C2-C3: Mild bilateral uncovertebral spurring with disc bulge. Degenerative endplate changes present. Very mild left foraminal narrowing. No significant canal or right foraminal stenosis.  C3-C4: Mild bilateral uncovertebral spurring with facet arthrosis. Broad-based posterior disc protrusion. There is resultant moderate left foraminal narrowing. Right neural foramen widely patent. Very mild canal stenosis related to the disc protrusion.  C4-C5: Left greater than right uncovertebral hypertrophy with mild facet arthrosis. Mild diffuse disc bulge. There is resultant mild bilateral foraminal narrowing. Central canal remains widely patent.  C5-C6: Mild bilateral uncovertebral spurring. Minimal facet hypertrophy. There is resultant very mild left foraminal narrowing. No significant right foraminal or central canal stenosis.  C6-C7: Bilateral uncovertebral hypertrophy with facet arthrosis. Minimal disc bulge. No significant canal stenosis. Foramina remain widely patent.  C7-T1: 2 mm anterolisthesis of C7 on T1. Associated disc bulge with mild bilateral uncovertebral hypertrophy. No significant canal stenosis. Mild bilateral foraminal narrowing P  MRI THORACIC SPINE FINDINGS  Study is degraded by motion artifact.  Vertebral bodies are normally aligned with preservation of the normal thoracic kyphosis. Vertebral body heights are preserved. No fracture.  Diffusely abnormal signal intensity seen throughout the vertebral body bone marrow, worrisome for widespread osseous metastatic disease. No associated pathologic fracture. Minimal posterior convex bulging of the T5, T6, T7, and T8 vertebral bodies without significant extraosseous tumor. No marrow edema.  Signal intensity within the thoracic spinal cord is normal.  No acute paraspinous soft  tissue abnormality. Bilateral pleural effusions noted, right larger than left. Multiple cystic lesions noted within the partially visualized kidneys.  Degenerative disc bulge noted at the C7-T1 level. No other significant degenerative disc bulging present within the visualized thoracic spine. Mild facet arthrosis within the lower lumbar spine. No significant canal or foraminal stenosis. No focal disc herniation.  IMPRESSION: MRI CERVICAL IMPRESSION:  1. 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 No pathologic fracture or other complication. 2. 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 No significant canal or foraminal stenosis within the cervical spine.  MRI THORACIC IMPRESSION:  1. 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 No pathologic fracture or other complication identified. 2. No significant stenosis within the thoracic spine. 3. Bilateral pleural effusions, right larger than left.   Electronically Signed   By: Jeannine Boga M.D.   On: 06/16/2015 01:55   Mr Thoracic Spine Wo Contrast  06/16/2015   CLINICAL DATA:  Initial evaluation for weakness.  EXAM: MRI CERVICAL AND THORACIC SPINE WITHOUT CONTRAST  TECHNIQUE: Multiplanar and multiecho pulse sequences of the cervical spine, to include the craniocervical junction and cervicothoracic junction, and lumbar spine, were obtained without intravenous contrast.  COMPARISON:  MRI of the lumbar  spine performed on 06/14/2015.  FINDINGS: MRI CERVICAL SPINE FINDINGS  Visualized portions of the brain demonstrate cerebral and cerebellar atrophy. Craniocervical junction widely patent.  There is 2 mm anterolisthesis of C7 on T1. Otherwise, vertebral bodies are normally aligned with preservation of the normal cervical lordosis. Vertebral body heights preserved. No fracture.  Diffuse abnormal  signal intensity seen throughout the visualized the vertebral body bone marrow, suspicious for diffuse osseous metastases. Findings are similar relative to those seen in the lumbar spine. No associated pathologic fracture. No extraosseous tumor.  Signal intensity within the cervical spinal cord is normal.  Visualized paraspinous soft tissues within normal limits. No prevertebral edema.  C2-C3: Mild bilateral uncovertebral spurring with disc bulge. Degenerative endplate changes present. Very mild left foraminal narrowing. No significant canal or right foraminal stenosis.  C3-C4: Mild bilateral uncovertebral spurring with facet arthrosis. Broad-based posterior disc protrusion. There is resultant moderate left foraminal narrowing. Right neural foramen widely patent. Very mild canal stenosis related to the disc protrusion.  C4-C5: Left greater than right uncovertebral hypertrophy with mild facet arthrosis. Mild diffuse disc bulge. There is resultant mild bilateral foraminal narrowing. Central canal remains widely patent.  C5-C6: Mild bilateral uncovertebral spurring. Minimal facet hypertrophy. There is resultant very mild left foraminal narrowing. No significant right foraminal or central canal stenosis.  C6-C7: Bilateral uncovertebral hypertrophy with facet arthrosis. Minimal disc bulge. No significant canal stenosis. Foramina remain widely patent.  C7-T1: 2 mm anterolisthesis of C7 on T1. Associated disc bulge with mild bilateral uncovertebral hypertrophy. No significant canal stenosis. Mild bilateral foraminal narrowing P  MRI THORACIC SPINE FINDINGS  Study is degraded by motion artifact.  Vertebral bodies are normally aligned with preservation of the normal thoracic kyphosis. Vertebral body heights are preserved. No fracture.  Diffusely abnormal signal intensity seen throughout the vertebral body bone marrow, worrisome for widespread osseous metastatic disease. No associated pathologic fracture. Minimal posterior  convex bulging of the T5, T6, T7, and T8 vertebral bodies without significant extraosseous tumor. No marrow edema.  Signal intensity within the thoracic spinal cord is normal.  No acute paraspinous soft tissue abnormality. Bilateral pleural effusions noted, right larger than left. Multiple cystic lesions noted within the partially visualized kidneys.  Degenerative disc bulge noted at the C7-T1 level. No other significant degenerative disc bulging present within the visualized thoracic spine. Mild facet arthrosis within the lower lumbar spine. No significant canal or foraminal stenosis. No focal disc herniation.  IMPRESSION: MRI CERVICAL IMPRESSION:  1. 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 No pathologic fracture or other complication. 2. 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 No significant canal or foraminal stenosis within the cervical spine.  MRI THORACIC IMPRESSION:  1. 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 No pathologic fracture or other complication identified. 2. No significant stenosis within the thoracic spine. 3. Bilateral pleural effusions, right larger than left.   Electronically Signed   By: Jeannine Boga M.D.   On: 06/16/2015 01:55   Medications:   . amLODipine  5 mg Oral Daily  . atorvastatin  20 mg Oral q1800  . bicalutamide  50 mg Oral Daily  . clopidogrel  75 mg Oral Daily  . dexamethasone  2 mg Oral Daily  . doxercalciferol      . doxercalciferol  7 mcg Intravenous Q T,Th,Sa-HD  . feeding supplement (NEPRO CARB STEADY)  237 mL Oral BID BM  . gabapentin  100  mg Oral BID  . heparin  5,000 Units Subcutaneous 3 times per day  . heparin  5,000 Units Dialysis Once in dialysis  . heparin  5,000 Units Dialysis Once in dialysis  . lanthanum  1,000 mg Oral BID WC  . metoprolol tartrate  25 mg Oral BID   . multivitamin  1 tablet Oral QHS  . pantoprazole  40 mg Oral Daily  . senna-docusate  2 tablet Oral QHS  . sodium chloride  3 mL Intravenous Q12H  . sodium chloride  3 mL Intravenous Q12H

## 2015-06-17 NOTE — Progress Notes (Signed)
Family Medicine Teaching Service Daily Progress Note Intern Pager: [phone]  [name] Medical record number: [id number] Date of birth: 06/26/1941 Age: 74 y.o. Gender: male  Primary Care Provider: Maris Berger, MD Consultants: Mariana Kaufman, Urology  Code Status: Full  Pt Overview and Major Events to Date:  9/8: Admitted; MRI Lumbar Spine--diffuse osteoblastic metastatic disease with pelvic and periaortic adenopathy suggestive of metastatic prostate CA; PSA 494  Assessment and Plan: Tim Kirby is a 74 y.o. male presenting with back pain and bilateral leg weakness for the last 3 months. MRI showing diffuse metastatic disease, thought to be prostate in origin. Admitted for pain control. PMH is significant for HTN, ESRD on dialysis, PVD, HLD, CHF (last ECHO 3/30: EF 55%, moderate LVH, moderate diastolic dysfunction), secondary hyperparathyroidism, anemia, gout.  1. Metastatic prostate cancer: MRI showing diffuse osteoblastic metastatic disease with pelvic and periaortic adenopathy, pattern suggestive of metastatic prostate cancer. Labs significant for alk phos of 1072. Patient noted a 30-35lb weight loss, he weighed 179lbs at an appt in 01/2015 with a current weight of 165lb concerning for malignancy as well.PSA 494.  - Oncology following, appreciate recommendations. Bicalutamide 40m daily for 30 days total. - IR consulted for bone biopsy. Will have biopsy done as outpatient as he needs to be off of blood thinners for 5 days. - Pain meds: gabapentin for radicular pain. Continue Norco 5/3222m1-2 tablets q6hrs - Discussed with urology, Dr. MaTresa Moorewho agrees with plan and recommends outpatient follow up.  2. Atrial Fibrillation: CHADVASC score is 4. EKG in ED demonstrated Afib. Repeat EKG with no significant change. - Continue home meds: Metoprolol tartrate - Given high probability of malignancy, patient is at an increased risk of clot formation- consider discussing  anticoagulation at a later time  3. ESRD on Dialysis: T/Th/Sat dialysis. - Nephro following, appreciate recommendations. Dialysis today  - Continue fosrenol   4. HTN - Continue home meds: Norvasc, Metoprolol  5. HLD - Continue home med: Zocor 4072m Given patient's ASCVD score, could consider high intensity statin however given current circumstances and overall general health, will continue Zocor for now  6. CHF (diastolic): Last ECHO (3/35/97/41EF 55%, moderate LVH, moderate diastolic dysfunction. Patient noted a previous dry weight of 190 but notes he is now 165 due to unintentional weight loss. Does not appear to be volume overloaded. - Continue home med: Metoprolol  7. Constipation - Colace, Miralax  8. PAD: Patient is s/p right femoral to below knee popliteal bypass graft with popliteal aneurysm ligation on 10/18/2012 and left popliteal artery stent for aneurysm on 12/29/2012. He also has a right common iliac aneurysm followed by Dr. CheBridgett Larsson continue Plavix   9. Anemia: Hgb stable at 9.1 on admission. Given ESRD, likely anemia of chronic disease. - continue to monitor  FEN/GI: Protonix, IV saline lock Prophylaxis: Heparin  Disposition: likely home today  Subjective:  No acute events overnight. Reports his pain is controlled and has not required PO pain meds. Remains in good spirits. No complaints otherwise. Tolerating diet.   Objective: Temp:  [97.9 F (36.6 C)-98.8 F (37.1 C)] 97.9 F (36.6 C) (09/11 0536) Pulse Rate:  [78-98] 83 (09/11 1004) Resp:  [18-20] 18 (09/11 0536) BP: (106-139)/(69-92) 132/69 mmHg (09/11 1004) SpO2:  [92 %-99 %] 98 % (09/11 0536) Weight:  [158 lb 11.7 oz (72 kg)-160 lb 15 oz (73 kg)] 158 lb 11.7 oz (72 kg) (09/11 0222)  Physical Exam: General: NAD  Cardiovascular: Irregular rhythm, normal  rate, no murmurs appreciated Respiratory: Mild crackles in lung bases bilaterally. Normal WOB.  Abdomen: +BS, soft, NTND Extremities: 1+ edema to R LE,  trace edema to L LE, weakness to LE R>L Psych: Appropriate mood and affect.   Laboratory:  Recent Labs Lab 06/14/15 1132 06/15/15 1902  WBC 7.2 8.0  HGB 9.1* 10.6*  HCT 29.2* 33.3*  PLT 104* 98*    Recent Labs Lab 06/14/15 1132 06/15/15 1902  NA 139 138  K 4.6 4.0  CL 98* 96*  CO2 27 23  BUN 46* 17  CREATININE 8.08* 4.49*  CALCIUM 9.5 9.4  PROT 5.9*  --   BILITOT 0.7  --   ALKPHOS 1072*  --   ALT 18  --   AST 34  --   GLUCOSE 106* 124*      Ref Range 1d ago    PSA 0.00 - 4.00 ng/mL 494.00 (H)         Imaging/Diagnostic Tests: Mr Cervical Spine Wo Contrast  06/16/2015   CLINICAL DATA:  Initial evaluation for weakness.  EXAM: MRI CERVICAL AND THORACIC SPINE WITHOUT CONTRAST  TECHNIQUE: Multiplanar and multiecho pulse sequences of the cervical spine, to include the craniocervical junction and cervicothoracic junction, and lumbar spine, were obtained without intravenous contrast.  COMPARISON:  MRI of the lumbar spine performed on 06/14/2015.  FINDINGS: MRI CERVICAL SPINE FINDINGS  Visualized portions of the brain demonstrate cerebral and cerebellar atrophy. Craniocervical junction widely patent.  There is 2 mm anterolisthesis of C7 on T1. Otherwise, vertebral bodies are normally aligned with preservation of the normal cervical lordosis. Vertebral body heights preserved. No fracture.  Diffuse abnormal signal intensity seen throughout the visualized the vertebral body bone marrow, suspicious for diffuse osseous metastases. Findings are similar relative to those seen in the lumbar spine. No associated pathologic fracture. No extraosseous tumor.  Signal intensity within the cervical spinal cord is normal.  Visualized paraspinous soft tissues within normal limits. No prevertebral edema.  C2-C3: Mild bilateral uncovertebral spurring with disc bulge. Degenerative endplate changes present. Very mild left foraminal narrowing. No significant canal or right foraminal stenosis.  C3-C4:  Mild bilateral uncovertebral spurring with facet arthrosis. Broad-based posterior disc protrusion. There is resultant moderate left foraminal narrowing. Right neural foramen widely patent. Very mild canal stenosis related to the disc protrusion.  C4-C5: Left greater than right uncovertebral hypertrophy with mild facet arthrosis. Mild diffuse disc bulge. There is resultant mild bilateral foraminal narrowing. Central canal remains widely patent.  C5-C6: Mild bilateral uncovertebral spurring. Minimal facet hypertrophy. There is resultant very mild left foraminal narrowing. No significant right foraminal or central canal stenosis.  C6-C7: Bilateral uncovertebral hypertrophy with facet arthrosis. Minimal disc bulge. No significant canal stenosis. Foramina remain widely patent.  C7-T1: 2 mm anterolisthesis of C7 on T1. Associated disc bulge with mild bilateral uncovertebral hypertrophy. No significant canal stenosis. Mild bilateral foraminal narrowing P  MRI THORACIC SPINE FINDINGS  Study is degraded by motion artifact.  Vertebral bodies are normally aligned with preservation of the normal thoracic kyphosis. Vertebral body heights are preserved. No fracture.  Diffusely abnormal signal intensity seen throughout the vertebral body bone marrow, worrisome for widespread osseous metastatic disease. No associated pathologic fracture. Minimal posterior convex bulging of the T5, T6, T7, and T8 vertebral bodies without significant extraosseous tumor. No marrow edema.  Signal intensity within the thoracic spinal cord is normal.  No acute paraspinous soft tissue abnormality. Bilateral pleural effusions noted, right larger than left. Multiple cystic lesions noted within  the partially visualized kidneys.  Degenerative disc bulge noted at the C7-T1 level. No other significant degenerative disc bulging present within the visualized thoracic spine. Mild facet arthrosis within the lower lumbar spine. No significant canal or foraminal  stenosis. No focal disc herniation.  IMPRESSION: MRI CERVICAL IMPRESSION:  1. 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 No pathologic fracture or other complication. 2. 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 No significant canal or foraminal stenosis within the cervical spine.  MRI THORACIC IMPRESSION:  1. 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 No pathologic fracture or other complication identified. 2. No significant stenosis within the thoracic spine. 3. Bilateral pleural effusions, right larger than left.   Electronically Signed   By: Jeannine Boga M.D.   On: 06/16/2015 01:55   Mr Thoracic Spine Wo Contrast  06/16/2015   CLINICAL DATA:  Initial evaluation for weakness.  EXAM: MRI CERVICAL AND THORACIC SPINE WITHOUT CONTRAST  TECHNIQUE: Multiplanar and multiecho pulse sequences of the cervical spine, to include the craniocervical junction and cervicothoracic junction, and lumbar spine, were obtained without intravenous contrast.  COMPARISON:  MRI of the lumbar spine performed on 06/14/2015.  FINDINGS: MRI CERVICAL SPINE FINDINGS  Visualized portions of the brain demonstrate cerebral and cerebellar atrophy. Craniocervical junction widely patent.  There is 2 mm anterolisthesis of C7 on T1. Otherwise, vertebral bodies are normally aligned with preservation of the normal cervical lordosis. Vertebral body heights preserved. No fracture.  Diffuse abnormal signal intensity seen throughout the visualized the vertebral body bone marrow, suspicious for diffuse osseous metastases. Findings are similar relative to those seen in the lumbar spine. No associated pathologic fracture. No extraosseous tumor.  Signal intensity within the cervical spinal cord is normal.  Visualized paraspinous soft tissues within normal limits. No prevertebral  edema.  C2-C3: Mild bilateral uncovertebral spurring with disc bulge. Degenerative endplate changes present. Very mild left foraminal narrowing. No significant canal or right foraminal stenosis.  C3-C4: Mild bilateral uncovertebral spurring with facet arthrosis. Broad-based posterior disc protrusion. There is resultant moderate left foraminal narrowing. Right neural foramen widely patent. Very mild canal stenosis related to the disc protrusion.  C4-C5: Left greater than right uncovertebral hypertrophy with mild facet arthrosis. Mild diffuse disc bulge. There is resultant mild bilateral foraminal narrowing. Central canal remains widely patent.  C5-C6: Mild bilateral uncovertebral spurring. Minimal facet hypertrophy. There is resultant very mild left foraminal narrowing. No significant right foraminal or central canal stenosis.  C6-C7: Bilateral uncovertebral hypertrophy with facet arthrosis. Minimal disc bulge. No significant canal stenosis. Foramina remain widely patent.  C7-T1: 2 mm anterolisthesis of C7 on T1. Associated disc bulge with mild bilateral uncovertebral hypertrophy. No significant canal stenosis. Mild bilateral foraminal narrowing P  MRI THORACIC SPINE FINDINGS  Study is degraded by motion artifact.  Vertebral bodies are normally aligned with preservation of the normal thoracic kyphosis. Vertebral body heights are preserved. No fracture.  Diffusely abnormal signal intensity seen throughout the vertebral body bone marrow, worrisome for widespread osseous metastatic disease. No associated pathologic fracture. Minimal posterior convex bulging of the T5, T6, T7, and T8 vertebral bodies without significant extraosseous tumor. No marrow edema.  Signal intensity within the thoracic spinal cord is normal.  No acute paraspinous soft tissue abnormality. Bilateral pleural effusions noted, right larger than left. Multiple cystic lesions noted within the partially visualized kidneys.  Degenerative disc bulge noted  at the C7-T1 level. No other significant  degenerative disc bulging present within the visualized thoracic spine. Mild facet arthrosis within the lower lumbar spine. No significant canal or foraminal stenosis. No focal disc herniation.  IMPRESSION: MRI CERVICAL IMPRESSION:  1. 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 No pathologic fracture or other complication. 2. 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 No significant canal or foraminal stenosis within the cervical spine.  MRI THORACIC IMPRESSION:  1. 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 No pathologic fracture or other complication identified. 2. No significant stenosis within the thoracic spine. 3. Bilateral pleural effusions, right larger than left.   Electronically Signed   By: Jeannine Boga M.D.   On: 06/16/2015 01:55   Mr Lumbar Spine Wo Contrast  06/14/2015   CLINICAL DATA:  Back pain with radiculopathy and gait dysfunction.  EXAM: MRI LUMBAR SPINE WITHOUT CONTRAST  TECHNIQUE: Multiplanar, multisequence MR imaging of the lumbar spine was performed. No intravenous contrast was administered.  COMPARISON:  None.  FINDINGS: Transitional lumbosacral anatomy with rudimentary S1-S2 disc space.  Diffusely abnormal appearance of the marrow with confluent hypo intensity on T1 weighted imaging. Given chest CT findings 04/23/2015 and bulky pelvic and lower retroperitoneal adenopathy, prostate cancer is strongly favored. No definitive extra osseous tumor to explain symptoms. 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  Hyperintensity at the level of the sacral ala and body is likely from field in homogeneity as on axial imaging there is no discrete fracture line or edema. Presacral edema which is nonspecific.  Normal conus signal and morphology.   Bilateral polycystic kidneys with atrophy, dialysis associated pattern.  Degenerative changes:  T12- L1: Unremarkable.  L1-L2: Unremarkable.  L2-L3: Mild spondylosis. Mild facet and ligament overgrowth. No impingement.  L3-L4: Mild disc bulging in the biforaminal regions. Facet and ligament overgrowth. No impingement.  L4-L5: Facet and ligament overgrowth which is moderate, with joint effusion on the right. Disc bulging, greater to the left. Mild to moderate biforaminal stenosis.  L5-S1:Disc narrowing with circumferential bulging. Bulky facet arthropathy with joint effusions. Biforaminal stenosis and L5 impingement. Subarticular recess effacement without discrete impingement.  IMPRESSION: 1. Diffuse osteoblastic metastatic disease with pelvic and periaortic adenopathy. The pattern suggests metastatic prostate cancer. 2. Advanced lower lumbar facet arthropathy with L5-S1 biforaminal stenosis and L5 impingement. 3.  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   Electronically Signed   By: Monte Fantasia M.D.   On: 06/14/2015 14:51   EKG (9/8): atrial fibrillation, HR 73 nonspecific ST and T wave abnormalities without significant change from previous EKGs  Milagros Loll, MD 06/17/2015, 1:04 PM PGY-2, [place] Intern pager: [phone], text pages welcome

## 2015-06-17 NOTE — Clinical Documentation Improvement (Signed)
Family Medicine  Can the diagnosis of Malnutrition be further specified?   Document Severity - Severe(third degree), Moderate (second degree), Mild (first degree)  Other condition  Unable to clinically determine  Document any associated diagnoses/conditions   Supporting Information: (per 9/09 Registered Dietician's evaluation):   Non-severe (moderate) malnutrition in context of chronic illness, Nepro Shake po BID, each supplement provides 425 kcal and 19 grams protein, Malnutrition related to chronic illness as evidenced by mild depletion of muscle mass, mild depletion of body fat.  Please exercise your independent, professional judgment when responding. A specific answer is not anticipated or expected.   Thank You, [place] [phone]

## 2015-06-18 NOTE — Discharge Summary (Signed)
[facility] Discharge Summary  [name] Medical record number: [id number] Date of birth: 1940/10/15 Age: 74 y.o. Gender: male Date of Admission: 06/14/2015  Date of Discharge: 06/17/2015 Admitting Physician: Annia Belt, MD  Primary Care Provider: Maris Berger, MD Consultants: Nephrology, Oncology   Indication for Hospitalization: Back pain and weakness likely 2/2 to metastatic prostate CA   Discharge Diagnoses/Problem List:  Patient Active Problem List   Diagnosis Date Noted  . ESRD (end stage renal disease)   . Metastatic cancer   . Lumbago   . Weakness of both legs   . Intractable pain 06/14/2015  . Lytic bone lesions on xray 06/14/2015  . Aneurysm of right femoral artery   . SOB (shortness of breath) 04/27/2015  . Hypoxia 04/27/2015  . Pulmonary infiltrate 04/27/2015  . Chest pressure 04/02/2015  . Left hip pain 04/02/2015  . Acute bronchitis 01/02/2015  . Volume overload 01/02/2015  . Acute respiratory failure with hypoxia 01/02/2015  . Aftercare following surgery of the circulatory system, [place] 04/15/2013  . Aneurysm of artery of lower extremity 01/14/2013  . PVD (peripheral vascular disease) 11/12/2012  . Iliac artery aneurysm, bilateral 11/12/2012  . Peripheral vascular disease, unspecified 10/01/2012  . End stage renal disease 10/01/2012  . Chest pain 09/21/2012  . Essential hypertension 09/21/2012  . Femoral artery aneurysm, bilateral 09/17/2012  . 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 Normal WOB.  Abdomen: +BS, soft, NTND Extremities: 1+ edema to R LE, trace edema to L LE, weakness to LE R>L Psych: Appropriate mood and affect.   Brief Hospital Course:  HIRAM MCIVER is a 74 y.o. male who presented to the ED with worsening back pain and leg pain. Was also having weakness with walking. On 04/23/15, he was seen in the ED for chest pain and had a CTA chest that showed sclerotic bone lesions throughout the thorax, which could be secondary to hyperparathyroidism or metastatic prostate carcinoma. No further workup was done at that time. In the ED, alkaline phosphatase was noted to be 1072. A lumbar MRI revealed diffuse osteoblastic metastatic disease with pelvic and periaortic adenopathy which suggested metastatic prostate cancer, advanced lumbar facet arthropathy with L5-S1 biforaminal stenosis and L5 impingement. A DRE by the EDP revealed the canal to be boggy and engorged in its circumference with irregular surfaces however was difficult to note whether the prostate was also boggy/enlarged/irregular. PSA was elevated to 494. Was seen by oncology and was started on a 30 day course of Bicalutamide. Pain became more controlled during hospitalization and patient was able to be discharged with pain medication and home health.     Issues for Follow Up:  1. Pain Control: Discharged with prn Vicodin. Will need to access if this is adequately controlling pain. 2. Needs outpatient biopsy with IR of bony met likely prostate cancer.  3. Will need outpatient urology f/u.  4. Due to h/o of AFib, CHADVASC score of 4 and high probability of malignancy, consider starting anticoagulation.  5. Needs outpatient oncology follow-up. Discharged with a 30 day course of Bicalutamide.   Significant Procedures: HD x1  Significant Labs and Imaging:   Recent Labs Lab 06/14/15 1132 06/15/15 1902  WBC 7.2 8.0  HGB 9.1* 10.6*  HCT 29.2* 33.3*  PLT  104* 98*    Recent Labs Lab 06/14/15 1132 06/15/15 1902  NA 139 138  K 4.6 4.0  CL 98* 96*  CO2 27 23  GLUCOSE 106* 124*  BUN 46* 17  CREATININE 8.08* 4.49*  CALCIUM 9.5 9.4  PHOS  --  4.1  ALKPHOS 1072*  --   AST 34  --    ALT 18  --   ALBUMIN 3.0* 3.6    Mr Cervical Spine Wo Contrast  06/16/2015   CLINICAL DATA:  Initial evaluation for weakness.  EXAM: MRI CERVICAL AND THORACIC SPINE WITHOUT CONTRAST  TECHNIQUE: Multiplanar and multiecho pulse sequences of the cervical spine, to include the craniocervical junction and cervicothoracic junction, and lumbar spine, were obtained without intravenous contrast.  COMPARISON:  MRI of the lumbar spine performed on 06/14/2015.  FINDINGS: MRI CERVICAL SPINE FINDINGS  Visualized portions of the brain demonstrate cerebral and cerebellar atrophy. Craniocervical junction widely patent.  There is 2 mm anterolisthesis of C7 on T1. Otherwise, vertebral bodies are normally aligned with preservation of the normal cervical lordosis. Vertebral body heights preserved. No fracture.  Diffuse abnormal signal intensity seen throughout the visualized the vertebral body bone marrow, suspicious for diffuse osseous metastases. Findings are similar relative to those seen in the lumbar spine. No associated pathologic fracture. No extraosseous tumor.  Signal intensity within the cervical spinal cord is normal.  Visualized paraspinous soft tissues within normal limits. No prevertebral edema.  C2-C3: Mild bilateral uncovertebral spurring with disc bulge. Degenerative endplate changes present. Very mild left foraminal narrowing. No significant canal or right foraminal stenosis.  C3-C4: Mild bilateral uncovertebral spurring with facet arthrosis. Broad-based posterior disc protrusion. There is resultant moderate left foraminal narrowing. Right neural foramen widely patent. Very mild canal stenosis related to the disc protrusion.  C4-C5: Left greater than right uncovertebral hypertrophy with mild facet arthrosis. Mild diffuse disc bulge. There is resultant mild bilateral foraminal narrowing. Central canal remains widely patent.  C5-C6: Mild bilateral uncovertebral spurring. Minimal facet hypertrophy. There is  resultant very mild left foraminal narrowing. No significant right foraminal or central canal stenosis.  C6-C7: Bilateral uncovertebral hypertrophy with facet arthrosis. Minimal disc bulge. No significant canal stenosis. Foramina remain widely patent.  C7-T1: 2 mm anterolisthesis of C7 on T1. Associated disc bulge with mild bilateral uncovertebral hypertrophy. No significant canal stenosis. Mild bilateral foraminal narrowing P  MRI THORACIC SPINE FINDINGS  Study is degraded by motion artifact.  Vertebral bodies are normally aligned with preservation of the normal thoracic kyphosis. Vertebral body heights are preserved. No fracture.  Diffusely abnormal signal intensity seen throughout the vertebral body bone marrow, worrisome for widespread osseous metastatic disease. No associated pathologic fracture. Minimal posterior convex bulging of the T5, T6, T7, and T8 vertebral bodies without significant extraosseous tumor. No marrow edema.  Signal intensity within the thoracic spinal cord is normal.  No acute paraspinous soft tissue abnormality. Bilateral pleural effusions noted, right larger than left. Multiple cystic lesions noted within the partially visualized kidneys.  Degenerative disc bulge noted at the C7-T1 level. No other significant degenerative disc bulging present within the visualized thoracic spine. Mild facet arthrosis within the lower lumbar spine. No significant canal or foraminal stenosis. No focal disc herniation.  IMPRESSION: MRI CERVICAL IMPRESSION:  1. 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 No pathologic fracture or other complication. 2. 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 No significant canal or foraminal stenosis within the cervical spine.  MRI  THORACIC IMPRESSION:  1. 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 No  pathologic fracture or other complication identified. 2. No significant stenosis within the thoracic spine. 3. Bilateral pleural effusions, right larger than left.   Electronically Signed   By: Jeannine Boga M.D.   On: 06/16/2015 01:55   Mr Thoracic Spine Wo Contrast  06/16/2015   CLINICAL DATA:  Initial evaluation for weakness.  EXAM: MRI CERVICAL AND THORACIC SPINE WITHOUT CONTRAST  TECHNIQUE: Multiplanar and multiecho pulse sequences of the cervical spine, to include the craniocervical junction and cervicothoracic junction, and lumbar spine, were obtained without intravenous contrast.  COMPARISON:  MRI of the lumbar spine performed on 06/14/2015.  FINDINGS: MRI CERVICAL SPINE FINDINGS  Visualized portions of the brain demonstrate cerebral and cerebellar atrophy. Craniocervical junction widely patent.  There is 2 mm anterolisthesis of C7 on T1. Otherwise, vertebral bodies are normally aligned with preservation of the normal cervical lordosis. Vertebral body heights preserved. No fracture.  Diffuse abnormal signal intensity seen throughout the visualized the vertebral body bone marrow, suspicious for diffuse osseous metastases. Findings are similar relative to those seen in the lumbar spine. No associated pathologic fracture. No extraosseous tumor.  Signal intensity within the cervical spinal cord is normal.  Visualized paraspinous soft tissues within normal limits. No prevertebral edema.  C2-C3: Mild bilateral uncovertebral spurring with disc bulge. Degenerative endplate changes present. Very mild left foraminal narrowing. No significant canal or right foraminal stenosis.  C3-C4: Mild bilateral uncovertebral spurring with facet arthrosis. Broad-based posterior disc protrusion. There is resultant moderate left foraminal narrowing. Right neural foramen widely patent. Very mild canal stenosis related to the disc protrusion.  C4-C5: Left greater than right uncovertebral hypertrophy with mild facet arthrosis.  Mild diffuse disc bulge. There is resultant mild bilateral foraminal narrowing. Central canal remains widely patent.  C5-C6: Mild bilateral uncovertebral spurring. Minimal facet hypertrophy. There is resultant very mild left foraminal narrowing. No significant right foraminal or central canal stenosis.  C6-C7: Bilateral uncovertebral hypertrophy with facet arthrosis. Minimal disc bulge. No significant canal stenosis. Foramina remain widely patent.  C7-T1: 2 mm anterolisthesis of C7 on T1. Associated disc bulge with mild bilateral uncovertebral hypertrophy. No significant canal stenosis. Mild bilateral foraminal narrowing P  MRI THORACIC SPINE FINDINGS  Study is degraded by motion artifact.  Vertebral bodies are normally aligned with preservation of the normal thoracic kyphosis. Vertebral body heights are preserved. No fracture.  Diffusely abnormal signal intensity seen throughout the vertebral body bone marrow, worrisome for widespread osseous metastatic disease. No associated pathologic fracture. Minimal posterior convex bulging of the T5, T6, T7, and T8 vertebral bodies without significant extraosseous tumor. No marrow edema.  Signal intensity within the thoracic spinal cord is normal.  No acute paraspinous soft tissue abnormality. Bilateral pleural effusions noted, right larger than left. Multiple cystic lesions noted within the partially visualized kidneys.  Degenerative disc bulge noted at the C7-T1 level. No other significant degenerative disc bulging present within the visualized thoracic spine. Mild facet arthrosis within the lower lumbar spine. No significant canal or foraminal stenosis. No focal disc herniation.  IMPRESSION: MRI CERVICAL IMPRESSION:  1. 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 No pathologic fracture or other complication. 2. 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 No significant canal  or foraminal stenosis within the cervical spine.  MRI THORACIC IMPRESSION:  1. Diffuse osteoblastic metastatic disease within the thoracic spine, concerning for metastatic prostate  carcinoma. No pathologic fracture or other complication identified. 2. No significant stenosis within the thoracic spine. 3. Bilateral pleural effusions, right larger than left.   Electronically Signed   By: Jeannine Boga M.D.   On: 06/16/2015 01:55   Mr Lumbar Spine Wo Contrast  06/14/2015   CLINICAL DATA:  Back pain with radiculopathy and gait dysfunction.  EXAM: MRI LUMBAR SPINE WITHOUT CONTRAST  TECHNIQUE: Multiplanar, multisequence MR imaging of the lumbar spine was performed. No intravenous contrast was administered.  COMPARISON:  None.  FINDINGS: Transitional lumbosacral anatomy with rudimentary S1-S2 disc space.  Diffusely abnormal appearance of the marrow with confluent hypo intensity on T1 weighted imaging. Given chest CT findings 04/23/2015 and bulky pelvic and lower retroperitoneal adenopathy, prostate cancer is strongly favored. No definitive extra osseous tumor to explain symptoms. 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  Hyperintensity at the level of the sacral ala and body is likely from field in homogeneity as on axial imaging there is no discrete fracture line or edema. Presacral edema which is nonspecific.  Normal conus signal and morphology.  Bilateral polycystic kidneys with atrophy, dialysis associated pattern.  Degenerative changes:  T12- L1: Unremarkable.  L1-L2: Unremarkable.  L2-L3: Mild spondylosis. Mild facet and ligament overgrowth. No impingement.  L3-L4: Mild disc bulging in the biforaminal regions. Facet and ligament overgrowth. No impingement.  L4-L5: Facet and ligament overgrowth which is moderate, with joint effusion on the right. Disc bulging, greater to the left. Mild to moderate biforaminal stenosis.  L5-S1:Disc narrowing with circumferential bulging.  Bulky facet arthropathy with joint effusions. Biforaminal stenosis and L5 impingement. Subarticular recess effacement without discrete impingement.  IMPRESSION: 1. Diffuse osteoblastic metastatic disease with pelvic and periaortic adenopathy. The pattern suggests metastatic prostate cancer. 2. Advanced lower lumbar facet arthropathy with L5-S1 biforaminal stenosis and L5 impingement. 3.  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   Electronically Signed   By: Monte Fantasia M.D.   On: 06/14/2015 14:51    Results/Tests Pending at Time of Discharge: None   Discharge Medications:    Medication List    STOP taking these medications        benzonatate 200 MG capsule  Commonly known as:  TESSALON      TAKE these medications        Albuterol Sulfate 108 (90 BASE) MCG/ACT Aepb  Inhale 2 puffs into the lungs every 4 (four) hours as needed.     amLODipine 5 MG tablet  Commonly known as:  NORVASC  Take 5 mg by mouth daily.     bicalutamide 50 MG tablet  Commonly known as:  CASODEX  Take 1 tablet (50 mg total) by mouth daily.     clopidogrel 75 MG tablet  Commonly known as:  PLAVIX  Take 1 tablet (75 mg total) by mouth daily.     dexamethasone 2 MG tablet  Commonly known as:  DECADRON  Take 1 tablet (2 mg total) by mouth daily.     gabapentin 100 MG capsule  Commonly known as:  NEURONTIN  Take 1 capsule (100 mg total) by mouth 2 (two) times daily.     HYDROcodone-acetaminophen 5-325 MG per tablet  Commonly known as:  NORCO/VICODIN  Take 1-2 tablets by mouth every 6 (six) hours as needed for moderate pain or severe pain.     lanthanum 1000 MG chewable tablet  Commonly known as:  FOSRENOL  Chew 1,000 mg by mouth 2 (  two) times daily with a meal.     metoprolol tartrate 25 MG tablet  Commonly known as:  LOPRESSOR  Take 25 mg by mouth 2 (two) times daily.     multivitamin Tabs tablet  Take 1 tablet by mouth daily.     omeprazole 40 MG capsule  Commonly  known as:  PRILOSEC  Take 40 mg by mouth daily.     simvastatin 40 MG tablet  Commonly known as:  ZOCOR  Take 40 mg by mouth every evening.        Discharge Instructions: Please refer to Patient Instructions section of EMR for full details.  Patient was counseled important signs and symptoms that should prompt return to medical care, changes in medications, dietary instructions, activity restrictions, and follow up appointments.   Follow-Up Appointments:     Follow-up Information    Schedule an appointment as soon as possible for a visit with Maris Berger, MD.   Specialty:  Family Medicine   Why:  Please call on Monday to set up a hospital follow up appointment.   Contact information:   [address] [place] [address] [phone]       Schedule an appointment as soon as possible for a visit with Alexis Frock, MD.   Specialty:  Urology   Why:  Please call to schedule an appointment to evaluate for prostate cancer.   Contact information:   [address] [place] [address] [phone]       Schedule an appointment as soon as possible for a visit with [facility], MD.   Specialty:  Oncology   Why:  Please call to schedule an appointment with oncology. If you prefer to see someone in [place], please ask your primary care provider to refer you.   Contact information:   [address] [place] [address] [phone]       Follow up with [facility]   Why:  home health physical therapy   Contact information:   [address] [place] [address] [phone]       Erasto Sleight Lauren Natasha Paulson, DO 06/18/2015, 2:22 PM PGY-1, [place]

## 2015-06-26 ENCOUNTER — Inpatient Hospital Stay (HOSPITAL_COMMUNITY)
Admission: AD | Admit: 2015-06-26 | Discharge: 2015-06-28 | DRG: 291 | Disposition: A | Discharge status: Home Health | Payer: Medicare Other | Source: Other Acute Inpatient Hospital | Attending: Family Medicine | Admitting: Family Medicine

## 2015-06-26 ENCOUNTER — Other Ambulatory Visit (HOSPITAL_COMMUNITY): Payer: Medicare Other

## 2015-06-26 ENCOUNTER — Inpatient Hospital Stay (HOSPITAL_COMMUNITY): Payer: Medicare Other

## 2015-06-26 DIAGNOSIS — I739 Peripheral vascular disease, unspecified: Secondary | ICD-10-CM | POA: Diagnosis not present

## 2015-06-26 DIAGNOSIS — Z87891 Personal history of nicotine dependence: Secondary | ICD-10-CM | POA: Diagnosis not present

## 2015-06-26 DIAGNOSIS — C61 Malignant neoplasm of prostate: Secondary | ICD-10-CM | POA: Diagnosis not present

## 2015-06-26 DIAGNOSIS — E440 Moderate protein-calorie malnutrition: Secondary | ICD-10-CM | POA: Diagnosis present

## 2015-06-26 DIAGNOSIS — I10 Essential (primary) hypertension: Secondary | ICD-10-CM

## 2015-06-26 DIAGNOSIS — Z8249 Family history of ischemic heart disease and other diseases of the circulatory system: Secondary | ICD-10-CM | POA: Diagnosis not present

## 2015-06-26 DIAGNOSIS — E785 Hyperlipidemia, unspecified: Secondary | ICD-10-CM | POA: Diagnosis present

## 2015-06-26 DIAGNOSIS — N186 End stage renal disease: Secondary | ICD-10-CM | POA: Diagnosis present

## 2015-06-26 DIAGNOSIS — I4819 Other persistent atrial fibrillation: Secondary | ICD-10-CM | POA: Diagnosis not present

## 2015-06-26 DIAGNOSIS — M109 Gout, unspecified: Secondary | ICD-10-CM | POA: Diagnosis present

## 2015-06-26 DIAGNOSIS — C7951 Secondary malignant neoplasm of bone: Secondary | ICD-10-CM | POA: Diagnosis not present

## 2015-06-26 DIAGNOSIS — C419 Malignant neoplasm of bone and articular cartilage, unspecified: Secondary | ICD-10-CM | POA: Diagnosis present

## 2015-06-26 DIAGNOSIS — I4892 Unspecified atrial flutter: Secondary | ICD-10-CM | POA: Diagnosis not present

## 2015-06-26 DIAGNOSIS — E1122 Type 2 diabetes mellitus with diabetic chronic kidney disease: Secondary | ICD-10-CM | POA: Diagnosis not present

## 2015-06-26 DIAGNOSIS — Z833 Family history of diabetes mellitus: Secondary | ICD-10-CM

## 2015-06-26 DIAGNOSIS — E875 Hyperkalemia: Secondary | ICD-10-CM | POA: Diagnosis not present

## 2015-06-26 DIAGNOSIS — Z992 Dependence on renal dialysis: Secondary | ICD-10-CM | POA: Diagnosis not present

## 2015-06-26 DIAGNOSIS — D638 Anemia in other chronic diseases classified elsewhere: Secondary | ICD-10-CM | POA: Diagnosis not present

## 2015-06-26 DIAGNOSIS — I481 Persistent atrial fibrillation: Secondary | ICD-10-CM | POA: Diagnosis not present

## 2015-06-26 DIAGNOSIS — R0600 Dyspnea, unspecified: Secondary | ICD-10-CM | POA: Diagnosis not present

## 2015-06-26 DIAGNOSIS — I509 Heart failure, unspecified: Secondary | ICD-10-CM | POA: Diagnosis not present

## 2015-06-26 DIAGNOSIS — I120 Hypertensive chronic kidney disease with stage 5 chronic kidney disease or end stage renal disease: Secondary | ICD-10-CM | POA: Diagnosis present

## 2015-06-26 DIAGNOSIS — Z515 Encounter for palliative care: Secondary | ICD-10-CM | POA: Diagnosis not present

## 2015-06-26 DIAGNOSIS — K5900 Constipation, unspecified: Secondary | ICD-10-CM | POA: Diagnosis present

## 2015-06-26 DIAGNOSIS — I5033 Acute on chronic diastolic (congestive) heart failure: Secondary | ICD-10-CM | POA: Diagnosis not present

## 2015-06-26 DIAGNOSIS — I480 Paroxysmal atrial fibrillation: Secondary | ICD-10-CM | POA: Diagnosis present

## 2015-06-26 DIAGNOSIS — R0902 Hypoxemia: Secondary | ICD-10-CM | POA: Diagnosis not present

## 2015-06-26 LAB — BASIC METABOLIC PANEL
ANION GAP: 18 — AB (ref 5–15)
BUN: 56 mg/dL — ABNORMAL HIGH (ref 6–20)
CALCIUM: 8.3 mg/dL — AB (ref 8.9–10.3)
CHLORIDE: 99 mmol/L — AB (ref 101–111)
CO2: 26 mmol/L (ref 22–32)
Creatinine, Ser: 7.51 mg/dL — ABNORMAL HIGH (ref 0.61–1.24)
GFR calc non Af Amer: 6 mL/min — ABNORMAL LOW (ref >60)
GFR, EST AFRICAN AMERICAN: 7 mL/min — AB (ref >60)
GLUCOSE: 108 mg/dL — AB (ref 65–99)
POTASSIUM: 5.6 mmol/L — AB (ref 3.5–5.1)
Sodium: 143 mmol/L (ref 135–145)

## 2015-06-26 LAB — CBC WITH DIFFERENTIAL/PLATELET
BASOS ABS: 0 10*3/uL (ref 0.0–0.1)
Basophils Relative: 0 %
EOS PCT: 0 %
Eosinophils Absolute: 0 10*3/uL (ref 0.0–0.7)
HEMATOCRIT: 28.7 % — AB (ref 39.0–52.0)
HEMOGLOBIN: 8.8 g/dL — AB (ref 13.0–17.0)
LYMPHS ABS: 2.1 10*3/uL (ref 0.7–4.0)
LYMPHS PCT: 31 %
MCH: 29.6 pg (ref 26.0–34.0)
MCHC: 30.7 g/dL (ref 30.0–36.0)
MCV: 96.6 fL (ref 78.0–100.0)
MONOS PCT: 4 %
Monocytes Absolute: 0.3 10*3/uL (ref 0.1–1.0)
Neutro Abs: 4.5 10*3/uL (ref 1.7–7.7)
Neutrophils Relative %: 65 %
Platelets: 81 10*3/uL — ABNORMAL LOW (ref 150–400)
RBC: 2.97 MIL/uL — AB (ref 4.22–5.81)
RDW: 22.3 % — AB (ref 11.5–15.5)
WBC: 6.9 10*3/uL (ref 4.0–10.5)

## 2015-06-26 LAB — MAGNESIUM: Magnesium: 2.1 mg/dL (ref 1.7–2.4)

## 2015-06-26 LAB — TROPONIN I
TROPONIN I: 0.04 ng/mL — AB (ref <0.031)
TROPONIN I: 0.04 ng/mL — AB (ref <0.031)

## 2015-06-26 MED ORDER — DOXERCALCIFEROL 4 MCG/2ML IV SOLN
7.0000 ug | INTRAVENOUS | Status: DC
  Administered 2015-06-26 – 2015-06-28 (×2): 7 ug via INTRAVENOUS
  Filled 2015-06-26: qty 4

## 2015-06-26 MED ORDER — NEPRO/CARBSTEADY PO LIQD
237.0000 mL | Freq: Two times a day (BID) | ORAL | Status: DC
  Administered 2015-06-27 – 2015-06-28 (×2): 237 mL via ORAL

## 2015-06-26 MED ORDER — HYDROCODONE-ACETAMINOPHEN 5-325 MG PO TABS
1.0000 | ORAL_TABLET | Freq: Four times a day (QID) | ORAL | Status: DC | PRN
  Administered 2015-06-26 – 2015-06-27 (×3): 1 via ORAL
  Filled 2015-06-26 (×3): qty 1

## 2015-06-26 MED ORDER — GABAPENTIN 100 MG PO CAPS
100.0000 mg | ORAL_CAPSULE | Freq: Two times a day (BID) | ORAL | Status: DC
  Administered 2015-06-26 – 2015-06-28 (×5): 100 mg via ORAL
  Filled 2015-06-26 (×5): qty 1

## 2015-06-26 MED ORDER — DOXERCALCIFEROL 4 MCG/2ML IV SOLN
INTRAVENOUS | Status: AC
  Administered 2015-06-26: 7 ug via INTRAVENOUS
  Filled 2015-06-26: qty 4

## 2015-06-26 MED ORDER — SENNOSIDES-DOCUSATE SODIUM 8.6-50 MG PO TABS
1.0000 | ORAL_TABLET | Freq: Every day | ORAL | Status: DC
  Administered 2015-06-26 – 2015-06-27 (×2): 1 via ORAL
  Filled 2015-06-26 (×2): qty 1

## 2015-06-26 MED ORDER — ACETAMINOPHEN 325 MG PO TABS: 650.0000 mg | ORAL_TABLET | ORAL | Status: DC | PRN

## 2015-06-26 MED ORDER — CLOPIDOGREL BISULFATE 75 MG PO TABS
75.0000 mg | ORAL_TABLET | Freq: Every day | ORAL | Status: DC
Start: 2015-06-26 — End: 2015-06-28
  Administered 2015-06-26 – 2015-06-28 (×3): 75 mg via ORAL
  Filled 2015-06-26 (×3): qty 1

## 2015-06-26 MED ORDER — RENA-VITE PO TABS
1.0000 | ORAL_TABLET | Freq: Every day | ORAL | Status: DC
  Administered 2015-06-26 – 2015-06-27 (×2): 1 via ORAL
  Filled 2015-06-26 (×2): qty 1

## 2015-06-26 MED ORDER — LANTHANUM CARBONATE 500 MG PO CHEW
1000.0000 mg | CHEWABLE_TABLET | Freq: Two times a day (BID) | ORAL | Status: DC
  Administered 2015-06-26: 1000 mg via ORAL
  Filled 2015-06-26 (×3): qty 2

## 2015-06-26 MED ORDER — HEPARIN SODIUM (PORCINE) 5000 UNIT/ML IJ SOLN
5000.0000 [IU] | Freq: Three times a day (TID) | INTRAMUSCULAR | Status: DC
Start: 2015-06-26 — End: 2015-06-28
  Administered 2015-06-26 – 2015-06-28 (×4): 5000 [IU] via SUBCUTANEOUS
  Filled 2015-06-26 (×5): qty 1

## 2015-06-26 MED ORDER — SODIUM CHLORIDE 0.9 % IV SOLN: 250.0000 mL | INTRAVENOUS | Status: DC | PRN

## 2015-06-26 MED ORDER — LANTHANUM CARBONATE 500 MG PO CHEW
1000.0000 mg | CHEWABLE_TABLET | Freq: Three times a day (TID) | ORAL | Status: DC
  Administered 2015-06-26 – 2015-06-28 (×7): 1000 mg via ORAL
  Filled 2015-06-26 (×8): qty 2

## 2015-06-26 MED ORDER — DARBEPOETIN ALFA 60 MCG/0.3ML IJ SOSY
60.0000 ug | PREFILLED_SYRINGE | INTRAMUSCULAR | Status: DC
  Filled 2015-06-26: qty 0.3

## 2015-06-26 MED ORDER — SIMVASTATIN 40 MG PO TABS
40.0000 mg | ORAL_TABLET | Freq: Every evening | ORAL | Status: DC
  Administered 2015-06-26 – 2015-06-28 (×3): 40 mg via ORAL
  Filled 2015-06-26 (×3): qty 1

## 2015-06-26 MED ORDER — PANTOPRAZOLE SODIUM 40 MG PO TBEC
40.0000 mg | DELAYED_RELEASE_TABLET | Freq: Every day | ORAL | Status: DC
  Administered 2015-06-26 – 2015-06-28 (×3): 40 mg via ORAL
  Filled 2015-06-26 (×3): qty 1

## 2015-06-26 MED ORDER — DARBEPOETIN ALFA 60 MCG/0.3ML IJ SOSY
PREFILLED_SYRINGE | INTRAMUSCULAR | Status: AC
  Administered 2015-06-26: 60 ug via INTRAVENOUS
  Filled 2015-06-26: qty 0.3

## 2015-06-26 MED ORDER — ONDANSETRON HCL 4 MG/2ML IJ SOLN: 4.0000 mg | Freq: Four times a day (QID) | INTRAMUSCULAR | Status: DC | PRN

## 2015-06-26 MED ORDER — METOPROLOL TARTRATE 25 MG PO TABS
25.0000 mg | ORAL_TABLET | Freq: Two times a day (BID) | ORAL | Status: DC
  Administered 2015-06-26 – 2015-06-27 (×2): 25 mg via ORAL
  Filled 2015-06-26 (×2): qty 1

## 2015-06-26 MED ORDER — SODIUM CHLORIDE 0.9 % IJ SOLN: 3.0000 mL | INTRAMUSCULAR | Status: DC | PRN

## 2015-06-26 MED ORDER — BICALUTAMIDE 50 MG PO TABS
50.0000 mg | ORAL_TABLET | Freq: Every day | ORAL | Status: DC
  Administered 2015-06-26 – 2015-06-28 (×3): 50 mg via ORAL
  Filled 2015-06-26 (×3): qty 1

## 2015-06-26 MED ORDER — ASPIRIN EC 81 MG PO TBEC
81.0000 mg | DELAYED_RELEASE_TABLET | Freq: Every day | ORAL | Status: DC
Start: 2015-06-26 — End: 2015-06-28
  Administered 2015-06-26 – 2015-06-28 (×3): 81 mg via ORAL
  Filled 2015-06-26 (×3): qty 1

## 2015-06-26 MED ORDER — DARBEPOETIN ALFA 60 MCG/0.3ML IJ SOSY
60.0000 ug | PREFILLED_SYRINGE | INTRAMUSCULAR | Status: DC
  Administered 2015-06-26: 60 ug via INTRAVENOUS

## 2015-06-26 MED ORDER — SODIUM CHLORIDE 0.9 % IJ SOLN
3.0000 mL | Freq: Two times a day (BID) | INTRAMUSCULAR | Status: DC
  Administered 2015-06-26 – 2015-06-28 (×5): 3 mL via INTRAVENOUS

## 2015-06-26 MED ORDER — METOPROLOL TARTRATE 12.5 MG HALF TABLET: 12.5000 mg | ORAL_TABLET | Freq: Two times a day (BID) | ORAL | Status: DC

## 2015-06-26 NOTE — Progress Notes (Signed)
Given and instructed use of incentive spirometer; achieved 1000 ml.

## 2015-06-26 NOTE — Progress Notes (Signed)
Nutrition Brief Note  Patient identified on the Malnutrition Screening Tool (MST) Report  Patient out of room at time of visit. Order for [facility] in place. RD to follow-up tomorrow for full nutrition assessment.   Scarlette Ar RD, LDN Inpatient Clinical Dietitian Pager: [phone] After Hours Pager: [phone]

## 2015-06-26 NOTE — Procedures (Deleted)
  I was present at this dialysis session, have reviewed the session itself and made  appropriate changes Kelly Splinter MD (pgr) [phone]    ([id number] 06/26/2015, 1:32 PM

## 2015-06-26 NOTE — Procedures (Signed)
  I was present at this dialysis session, have reviewed the session itself and made  appropriate changes Kelly Splinter MD (pgr) [phone]    ([id number] 06/26/2015, 1:22 PM

## 2015-06-26 NOTE — Consult Note (Signed)
Primary cardiologist: Previously Dr Lia Foyer  HPI: 74 year old male with past medical history of end-stage renal disease, hypertension, hyperlipidemia, chronic diastolic congestive heart failure, metastatic prostate cancer for evaluation of acute on chronic diastolic congestive heart failure. Nuclear study January 2014 showed ejection fraction 61% and normal perfusion. Echocardiogram March 2016 showed normal LV function, moderate left ventricular hypertrophy, grade 2 diastolic dysfunction with elevated left ventricular filling pressures, severe left atrial enlargement and moderate right atrial enlargement. Patient complains of 2 days of worsening dyspnea on exertion, orthopnea and lower extremity edema. He has chest tightness that increases with lying flat and inspiration. He has not missed dialysis. He went to [facility] and was transferred for further management.  Medications Prior to Admission  Medication Sig Dispense Refill  . Albuterol Sulfate 108 (90 BASE) MCG/ACT AEPB Inhale 2 puffs into the lungs every 4 (four) hours as needed (wheezing).     Marland Kitchen amLODipine (NORVASC) 5 MG tablet Take 5 mg by mouth daily.     . bicalutamide (CASODEX) 50 MG tablet Take 1 tablet (50 mg total) by mouth daily. 29 tablet 0  . clopidogrel (PLAVIX) 75 MG tablet Take 1 tablet (75 mg total) by mouth daily. 90 tablet 3  . gabapentin (NEURONTIN) 100 MG capsule Take 1 capsule (100 mg total) by mouth 2 (two) times daily. 60 capsule 0  . HYDROcodone-acetaminophen (NORCO/VICODIN) 5-325 MG per tablet Take 1-2 tablets by mouth every 6 (six) hours as needed for moderate pain or severe pain. 30 tablet 0  . lanthanum (FOSRENOL) 1000 MG chewable tablet Chew 1,000 mg by mouth 2 (two) times daily with a meal.    . metoprolol tartrate (LOPRESSOR) 25 MG tablet Take 25 mg by mouth 2 (two) times daily.    . multivitamin (RENA-VIT) TABS tablet Take 1 tablet by mouth daily.    Marland Kitchen omeprazole (PRILOSEC) 40 MG capsule Take 40 mg by  mouth daily.    . simvastatin (ZOCOR) 40 MG tablet Take 40 mg by mouth every evening.    . [DISCONTINUED] dexamethasone (DECADRON) 2 MG tablet Take 1 tablet (2 mg total) by mouth daily. 30 tablet 0    No Known Allergies  Past Medical History  Diagnosis Date  . Hyperlipidemia   . Gout   . Secondary hyperparathyroidism   . Anemia   . Colon, diverticulosis   . Thyroid nodule   . Anginal pain     sees Dr. Lia Foyer  . Hypertension     sees Dr. Willy Eddy  . Aneurysm artery, popliteal   . Upper respiratory infection April 2016  . CHF (congestive heart failure)   . Aneurysm of right femoral artery   . ESRD (end stage renal disease) on dialysis     "TTS; [place]" (06/15/2015)  . Renal insufficiency     Past Surgical History  Procedure Laterality Date  . Knee surgery Left     cyst removal by Dr. Lorin Mercy  . Av fistula placement Left     left UA AVF  . Femoral-popliteal bypass graft  10/18/2012    Procedure: BYPASS GRAFT FEMORAL-POPLITEAL ARTERY;  Surgeon: Conrad Mary Esther, MD;  Location: [facility] OR;  Service: Vascular;  Laterality: Right;  Right Popliteal Aneurysm Exclusion; Ultrasound guided  . False aneurysm repair Left 12/29/2012    Procedure: REPAIR FALSE ANEURYSM;  Surgeon: Conrad Stewart Manor, MD;  Location: [place];  Service: Vascular;  Laterality: Left;  . Endovascular repair of popliteal artery aneurysm Left 12/29/2012    Procedure: ENDOVASCULAR REPAIR  OF POPLITEAL AND FEMORAL ARTERY ANEURYSM;  Surgeon: Conrad Bangor, MD;  Location: [place];  Service: Vascular;  Laterality: Left;  . Abdominal aortagram N/A 09/27/2012    Procedure: ABDOMINAL Maxcine Ham;  Surgeon: Angelia Mould, MD;  Location: [facility] CATH LAB;  Service: Cardiovascular;  Laterality: N/A;  . Tonsillectomy and adenoidectomy      Social History   Social History  . Marital Status: Married    Spouse Name: N/A  . Number of Children: N/A  . Years of Education: N/A   Occupational History  . Not on file.   Social History Main Topics    . Smoking status: Former Smoker -- 50 years    Types: Pipe, Cigars, Cigarettes    Quit date: 09/18/2007  . Smokeless tobacco: Never Used     Comment: "mostly smoked cigars and pipes"  . Alcohol Use: Yes     Comment: "stopped drinking in ~ 2008"  . Drug Use: No  . Sexual Activity: Not Currently   Other Topics Concern  . Not on file   Social History Narrative    Family History  Problem Relation Age of Onset  . Hypertension Mother   . Diabetes Mother   . Diabetes Brother     ROS:  Patient makes minimal urine but no fevers or chills, productive cough, hemoptysis, dysphasia, odynophagia, melena, hematochezia, rash, seizure activity, claudication. Remaining systems are negative.  Physical Exam:   Blood pressure 137/76, pulse 74, temperature 97.7 F (36.5 C), temperature source Oral, resp. rate 18, height _0  (1.727 m), weight 78.2 kg (172 lb 6.4 oz), SpO2 95 %.  General:  Well developed/well nourished in mild respiratory distress Skin warm/dry Patient not depressed No peripheral clubbing Back-normal HEENT-normal/normal eyelids Neck supple/normal carotid upstroke bilaterally; no bruits; positive JVD; no thyromegaly chest - basilar crackles CV - RRR/normal S1 and S2; no murmurs, rubs or gallops;  PMI nondisplaced Abdomen -NT/ND, no HSM, no mass, + bowel sounds, no bruit 2+ femoral pulses, no bruits Ext-1+ ankle edema, no chords, diminished distal pulses Neuro-grossly nonfocal  ECG normal sinus rhythm, nonspecific ST changes.  Results for orders placed or performed during the hospital encounter of 06/26/15 (from the past 48 hour(s))  CBC WITH DIFFERENTIAL     Status: Abnormal   Collection Time: 06/26/15  3:49 AM  Result Value Ref Range   WBC 6.9 4.0 - 10.5 K/uL   RBC 2.97 (L) 4.22 - 5.81 MIL/uL   Hemoglobin 8.8 (L) 13.0 - 17.0 g/dL   HCT 28.7 (L) 39.0 - 52.0 %   MCV 96.6 78.0 - 100.0 fL   MCH 29.6 26.0 - 34.0 pg   MCHC 30.7 30.0 - 36.0 g/dL   RDW 22.3 (H) 11.5 -  15.5 %   Platelets 81 (L) 150 - 400 K/uL    Comment: CONSISTENT WITH PREVIOUS RESULT   Neutrophils Relative % 65 %   Lymphocytes Relative 31 %   Monocytes Relative 4 %   Eosinophils Relative 0 %   Basophils Relative 0 %   Neutro Abs 4.5 1.7 - 7.7 K/uL   Lymphs Abs 2.1 0.7 - 4.0 K/uL   Monocytes Absolute 0.3 0.1 - 1.0 K/uL   Eosinophils Absolute 0.0 0.0 - 0.7 K/uL   Basophils Absolute 0.0 0.0 - 0.1 K/uL   RBC Morphology POLYCHROMASIA PRESENT     Comment: TEARDROP CELLS  Basic metabolic panel     Status: Abnormal   Collection Time: 06/26/15  3:49 AM  Result Value Ref Range  Sodium 143 135 - 145 mmol/L   Potassium 5.6 (H) 3.5 - 5.1 mmol/L   Chloride 99 (L) 101 - 111 mmol/L   CO2 26 22 - 32 mmol/L   Glucose, Bld 108 (H) 65 - 99 mg/dL   BUN 56 (H) 6 - 20 mg/dL   Creatinine, Ser 7.51 (H) 0.61 - 1.24 mg/dL   Calcium 8.3 (L) 8.9 - 10.3 mg/dL   GFR calc non Af Amer 6 (L) >60 mL/min   GFR calc Af Amer 7 (L) >60 mL/min    Comment: (NOTE) The eGFR has been calculated using the CKD EPI equation. This calculation has not been validated in all clinical situations. eGFR's persistently <60 mL/min signify possible Chronic Kidney Disease.    Anion gap 18 (H) 5 - 15  Magnesium     Status: None   Collection Time: 06/26/15  3:49 AM  Result Value Ref Range   Magnesium 2.1 1.7 - 2.4 mg/dL  Troponin I     Status: Abnormal   Collection Time: 06/26/15  3:49 AM  Result Value Ref Range   Troponin I 0.04 (H) <0.031 ng/mL    Comment:        PERSISTENTLY INCREASED TROPONIN VALUES IN THE RANGE OF 0.04-0.49 ng/mL CAN BE SEEN IN:       -UNSTABLE ANGINA       -CONGESTIVE HEART FAILURE       -MYOCARDITIS       -CHEST TRAUMA       -ARRYHTHMIAS       -LATE PRESENTING MYOCARDIAL INFARCTION       -COPD   CLINICAL FOLLOW-UP RECOMMENDED.     Dg Chest Port 1 View  06/26/2015   CLINICAL DATA:  Diastolic CHF acute on chronic, hypoxia, dyspnea  EXAM: PORTABLE CHEST - 1 VIEW  COMPARISON:  Portable exam  0335 hr compared to 06/25/2015 and correlated with CT chest of 06/25/2015  FINDINGS: Enlargement of cardiac silhouette with vascular congestion.  Atherosclerotic calcification aorta.  Scattered pulmonary infiltrates likely representing pulmonary edema and CHF.  Bibasilar effusions.  No pneumothorax.  Scattered sclerotic osseous lesions are seen compatible with osseous metastatic disease.  IMPRESSION: Persistent CHF with bibasilar effusions.  Sclerotic osseous metastatic disease.   Electronically Signed   By: Lavonia Dana M.D.   On: 06/26/2015 04:07    Assessment/Plan 1 acute on chronic diastolic congestive heart failure-the patient is volume overloaded on examination. He makes minimal urine. I have discussed with nephrology. He needs dialysis with more volume taken off. Repeat echocardiogram. 2 chest tightness-this appears more likely related to his congestive heart failure/volume excess. Enzymes are being cycled; initial troponin 0.04 nondiagnostic. 3 end-stage renal disease-this appears to be the predominant issue related to volume excess. Nephrology consult pending. Will need lower dry weight. 4 metastatic prostate cancer 5 hypertension-continue present blood pressure medications. If low blood pressure becomes a problem related to decreasing his dry weight would discontinue Norvasc.  Kirk Ruths MD 06/26/2015, 9:49 AM

## 2015-06-26 NOTE — H&P (Signed)
[facility] Admission History and Physical Service Pager: [phone]  [name] Medical record number: [id number] Date of birth: Oct 18, 1940 Age: 74 y.o. Gender: male  Primary Care Provider: [name], MD Consultants: Nephrology  Code Status: Full (confirmed on admission)  Chief Complaint: Dyspnea, Edema   Assessment and Plan: QAADIR KENT is a 74 y.o. male presenting as transfer from [facility] with worsening dyspnea and edema, consistent with acute on chronic dCHF exacerbation in setting of ESRD, accepted transfer due to req HD. PMH is significant for recently diagnosed metastatic prostate cancer, HTN, ESRD on HD, PVD, HLD, CHF, anemia, and gout.   Acute on chronic CHF (Diastolic) Exacerbation: Last ECHO 01/03/15: EF 55%, moderate LVH, Grade II diastolic dysfunction. Reportedly to attend Cardiology apt this morning? Last stress test myocardial perfusion scan 10/2012 (negative for ischemia). Appears fluid overloaded on exam and BP is elevated at presentation. Requiring 2L O2 via [place]. Weight at last hospital admission was 165 and he is presently 172 lbs.  -Admit to telemetry with continuous pulse ox  -nephrology consult in AM for routine Tues HD; will hold off on giving Lasix (patient admits to making sm amt urine, could consider trial lasix prior to more urgent HD if acutely decompensated). -supplemental O2 prn for sats <92%  -Continue home metoprolol from 25 mg BID in acute CHF exacerbation, not decompenstated -obtain repeat CXR, on transfer -daily weights; strict I&Os -incentive spirometry - Consult Cardiology (Adv HF Team) for optimizing med regimen in setting of complicated CHF with ESRD, especially with patient re-admitted, without clear obvious trigger to recent fluid overload.   Chest Tightness, Rule out MI: Per patient's description sounds related to his SOB, possibly with bone pain from multiple bone mets including  sternum and ribs. However, due to his multiple risk factors will r/o ACS.  -troponin q 6 hours x2  -obtain EKG   ESRD on HD: T/TH/Sat dialysis. Has not missed any HD outpatients. K was 6.1 at [facility] and patient was given Kayexelate and s/p BM x 1 since transfer to [facility].  -repeat BMET now; monitor BMET daily  -nephrology consult; plan for HD 9/20  -Magnesium ordered  -give further Kayexelate if needed; if K elevated above 6.5 will call nephro for more urgent dialysis   HTN: BP of 150/96 at admission.  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 Reports he has been taking and that he is getting established with same Oncologist who saw him at [facility]. Has been taking Vicodin prn for skeletal pain; notes that it is improved since discharge.  -pain control: Gabapentin 100 mg BID, Vicodin 1-2 tablets q6 hours PRN -palliative care consult, for [place] discussion, patient currently acknowledges terminal dx, but plans to pursue chemotherapy, suspect pt still has more oncology discussions in near future, but would benefit from re-visiting overall goals -continue Bicalutamide 50mg  daily, (anti-androgen therapy) -holding on Oncology consult for now; can consider consulting once no longer volume overloaded if needed   Constipation: Reports chronic constipation relieved with OTC medications. Likely worsened with narcotic use.  -Senakot-S daily at bedtime   HLD: -continue home Zocor 40 mg   Anemia: Patient has h/o of anemia. At last admission, was stable at around his baseline of 9. Given ESRD, most likely has anemia of chronic disease.  -obtain CBC   PAD: Patient is s/p right femoral to  below knee popliteal bypass graft with popliteal aneurysm ligation on 10/18/2012 and left popliteal artery stent for aneurysm on 12/29/2012. He also has a right common iliac aneurysm with Dr. Bridgett Larsson. -  continue Plavix   FEN/GI: Renal Diet with Fluid Restriction; SLIV  Prophylaxis: Heparin   Disposition: Admit to telemetry; FPTS Attending Nori Riis. Volume overloaded with likely acute dCHF exac in setting of ESRD, proceed with Nephrology consult and anticipated HD 9/20 as scheduled, anticipate patient could be discharged when euvolemic and symptoms improved, 1-3 days, can follow-up outpatient with Oncology for prostate cancer.   History of Present Illness:  Tim Kirby is a 74 y.o. male presenting with SOB. Transferred from [facility] for HD. Recently discharged from [facility] on 9/11. Reports compliance with outpatient HD, last treatment on Saturday 9/17. Began to have tightness in neck and chest Sunday 9/18. Very difficult to catch his breath. These symptoms worsened over the course of a coupe days prior to presenting to the hospital. Does not use O2 at home, but does report mild SOB at baseline. Reports increased edema in LE from baseline. Reports oliguria.  Reports good PO intake without increase in salt in diet.   In regards to his recent diagnosis of metastatic prostate cancer, he reports that doctors can slow the cancer down but can't cure him. Plans to see Oncologist in [place]. Has been taking Bicalutamide daily. Supposed to begin Chemotherapy soon. Has been taking Vicodin for pain control of bony mets. Notes that pain has improved since last discharge.   At [place], he had a K of 6.1 and was given Kayexelate. BNP was 4400. CXR demonstrated bilateral infiltrates. Reports that his wife knows which medications he takes and that she can help go through his mediation list tomorrow afternoon.   Review Of Systems: Per HPI with the following additions: Admits to emesis x1 without blood. Admits to constipation.  Otherwise 12 point review of systems was performed and was unremarkable.  Patient Active Problem List   Diagnosis Date Noted  . Diastolic CHF, acute on chronic  06/26/2015  . ESRD (end stage renal disease)   . Metastatic cancer   . Lumbago   . Weakness of both legs   . Intractable pain 06/14/2015  . Lytic bone lesions on xray 06/14/2015  . Aneurysm of right femoral artery   . SOB (shortness of breath) 04/27/2015  . Hypoxia 04/27/2015  . Pulmonary infiltrate 04/27/2015  . Chest pressure 04/02/2015  . Left hip pain 04/02/2015  . Acute bronchitis 01/02/2015  . Volume overload 01/02/2015  . Acute respiratory failure with hypoxia 01/02/2015  . Aftercare following surgery of the circulatory system, [place] 04/15/2013  . Aneurysm of artery of lower extremity 01/14/2013  . PVD (peripheral vascular disease) 11/12/2012  . Iliac artery aneurysm, bilateral 11/12/2012  . Peripheral vascular disease, unspecified 10/01/2012  . End stage renal disease 10/01/2012  . Chest pain 09/21/2012  . Essential hypertension 09/21/2012  . Femoral artery aneurysm, bilateral 09/17/2012  . 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 Hyperlipidemia   . Gout   . Secondary hyperparathyroidism   . Anemia   . Colon, diverticulosis   . Thyroid nodule   . Anginal pain     sees Dr. Lia Foyer  . Hypertension     sees Dr. Willy Eddy  . Aneurysm artery, popliteal   . Upper respiratory infection April 2016  . CHF (congestive heart failure)   .  Aneurysm of right femoral artery   . ESRD (end stage renal disease) on dialysis     "TTS; [place]" (06/15/2015)  . Renal insufficiency    Past Surgical History: Past Surgical History  Procedure Laterality Date  . Knee surgery Left     cyst removal by Dr. Lorin Mercy  . Av fistula placement Left     left UA AVF  . Femoral-popliteal bypass graft  10/18/2012    Procedure: BYPASS GRAFT FEMORAL-POPLITEAL ARTERY;  Surgeon: Conrad Sewaren, MD;  Location: [facility] OR;  Service: Vascular;  Laterality: Right;  Right Popliteal Aneurysm Exclusion; Ultrasound guided  . False aneurysm repair  Left 12/29/2012    Procedure: REPAIR FALSE ANEURYSM;  Surgeon: Conrad Napaskiak, MD;  Location: [place];  Service: Vascular;  Laterality: Left;  . Endovascular repair of popliteal artery aneurysm Left 12/29/2012    Procedure: ENDOVASCULAR REPAIR OF POPLITEAL AND FEMORAL ARTERY ANEURYSM;  Surgeon: Conrad Morrisville, MD;  Location: [place];  Service: Vascular;  Laterality: Left;  . Abdominal aortagram N/A 09/27/2012    Procedure: ABDOMINAL Maxcine Ham;  Surgeon: Angelia Mould, MD;  Location: [facility] CATH LAB;  Service: Cardiovascular;  Laterality: N/A;  . Tonsillectomy and adenoidectomy     Social History: Social History  Substance Use Topics  . Smoking status: Former Smoker -- 50 years    Types: Pipe, Cigars, Cigarettes    Quit date: 09/18/2007  . Smokeless tobacco: Never Used     Comment: "mostly smoked cigars and pipes"  . Alcohol Use: Yes     Comment: "stopped drinking in ~ 2008"   Additional social history: Lives at home with his wife and mother in [place].   Please also refer to relevant sections of EMR.  Family History: Family History  Problem Relation Age of Onset  . Hypertension Mother   . Diabetes Mother   . Diabetes Brother    Allergies and Medications: No Known Allergies No current facility-administered medications on file prior to encounter.   Current Outpatient Prescriptions on File Prior to Encounter  Medication Sig Dispense Refill  . Albuterol Sulfate 108 (90 BASE) MCG/ACT AEPB Inhale 2 puffs into the lungs every 4 (four) hours as needed.    Marland Kitchen amLODipine (NORVASC) 5 MG tablet Take 5 mg by mouth daily.     . bicalutamide (CASODEX) 50 MG tablet Take 1 tablet (50 mg total) by mouth daily. 29 tablet 0  . clopidogrel (PLAVIX) 75 MG tablet Take 1 tablet (75 mg total) by mouth daily. 90 tablet 3  . gabapentin (NEURONTIN) 100 MG capsule Take 1 capsule (100 mg total) by mouth 2 (two) times daily. 60 capsule 0  . HYDROcodone-acetaminophen (NORCO/VICODIN) 5-325 MG per tablet Take 1-2 tablets  by mouth every 6 (six) hours as needed for moderate pain or severe pain. 30 tablet 0  . lanthanum (FOSRENOL) 1000 MG chewable tablet Chew 1,000 mg by mouth 2 (two) times daily with a meal.    . metoprolol tartrate (LOPRESSOR) 25 MG tablet Take 25 mg by mouth 2 (two) times daily.    . multivitamin (RENA-VIT) TABS tablet Take 1 tablet by mouth daily.    Marland Kitchen omeprazole (PRILOSEC) 40 MG capsule Take 40 mg by mouth daily.    . simvastatin (ZOCOR) 40 MG tablet Take 40 mg by mouth every evening.      Objective: BP 150/96 mmHg  Pulse 70  Temp(Src) 97.4 F (36.3 C) (Oral)  Resp 18  Ht 5\' 8"  (1.727 m)  Wt 172 lb 6.4  oz (78.2 kg)  BMI 26.22 kg/m2  SpO2 96% Exam: General: Elderly man lying in bed in NAD  Eyes: EOMI, PERRLA ENTM: Oropharynx clear. Mucus membranes mildly dry.  Neck: Full ROM. +JVD.  Cardiovascular: RRR. No m/r/g. Faint distal pulses.  Respiratory: Peak in place. Mildly increased WOB. Diminished lung sounds on left. Diffuse crackles on the right.  Abdomen: +BS, soft, NTND MSK: No TTP of spinous processes. TTP over clavicle on the right. 1-2+ dependent edema of LE bilaterally. Left arm AV fistula with thrill.  Skin: Warm and dry  Neuro: Alert and oriented. Grip strength 5/5 bilaterally. No gross deficits noted.  Psych: Appropriate mood and affect.   Labs and Imaging: CBC BMET  No results for input(s): WBC, HGB, HCT, PLT in the last 168 hours. No results for input(s): NA, K, CL, CO2, BUN, CREATININE, GLUCOSE, CALCIUM in the last 168 hours.   9/19 CT Chest w/o contrast (at outside [facility], prior to admit) Widespread sclerotic osseous metastatic disease. Enlargement of cardiac silhouette Probable mild pulmonary edema with bilateral pleural effusions and compressive atelectasis of lower lobes. Mild aneurysmal dilatation ascending thoracic aorta 4.0 cm greatest diameter. Recommend annual imaging follow-up if not already done.  BNP 4400 (outside [facility])  9/20  CXR 1v Portable Bilateral pulm infiltrates c/w pulm edema, stable from initial 9/19.  [name], DO 06/26/2015, 4:01 AM PGY-1, [place] Intern pager: [phone], text pages welcome  Upper Level Addendum:  I have seen and evaluated this patient along with Dr. Juleen China and reviewed the above note, making necessary revisions in purple.  Nobie Putnam, [place], PGY-3

## 2015-06-26 NOTE — Consult Note (Signed)
[name] Renal Consultation Note    Indication for Consultation:  Management of ESRD/hemodialysis; anemia, hypertension/volume and secondary hyperparathyroidism PCP: [name], MD  HPI: Tim Kirby is a 74 y.o. male with ESRD on hemodialysis T, Th, S at [facility]. Patient has PMH significant for Hypertension, HF, angina, Secondary hyperparathyroidism, Anemia of chronic disease, diverticulosis, gout, thyroid nodule, URI, aneurysm of R. Femoral artery.  Patient was recently diagnosed metastatic prostate cancer. Has received 30 day Rx of Bicalutamide.  History of acute on chronic HF moderate LVH, Grade II diastolic dysfunction. Last EF 55% 01/03/15. Recent hospital  Admission 06/15/15 for back pain, MRI showed diffuse osteoblastic metastatic disease.   Patient was transferred from [facility] where he was seen and evaluated for two day history of increased SOB, DOE, orthopnea, lower extremity edema. Also C/O chest tightness when lying down.  Patient denies fever chills, malaise, N, V,D, No recent URI. Patient states he had hemodialysis Monday, left slightly early due to mechanical issues with hemodialysis machine. Left HD 1.2 above EDW. Chest xray shows silhouette with vascular congestion, bibasilar effusions, scattered pulmonary infiltrates likely representing pulmonary edema.  and CHF.   Patient attends HD at [facility]. Usually does not miss treatments or sign off early. Last in center values: Phos 4.9 Ca 9.3 C Ca 9.6 (05/31/15) Hgb 9.7 (06/21/15).   Past Medical History  Diagnosis Date  . Hyperlipidemia   . Gout   . Secondary hyperparathyroidism   . Anemia   . Colon, diverticulosis   . Thyroid nodule   . Anginal pain     sees Dr. Lia Foyer  . Hypertension     sees Dr. Willy Eddy  . Aneurysm artery, popliteal   . Upper respiratory infection April 2016  . CHF (congestive heart failure)   . Aneurysm of right femoral artery   .  ESRD (end stage renal disease) on dialysis     "TTS; [place]" (06/15/2015)  . Renal insufficiency    Past Surgical History  Procedure Laterality Date  . Knee surgery Left     cyst removal by Dr. Lorin Mercy  . Av fistula placement Left     left UA AVF  . Femoral-popliteal bypass graft  10/18/2012    Procedure: BYPASS GRAFT FEMORAL-POPLITEAL ARTERY;  Surgeon: Conrad Vinings, MD;  Location: [facility];  Service: Vascular;  Laterality: Right;  Right Popliteal Aneurysm Exclusion; Ultrasound guided  . False aneurysm repair Left 12/29/2012    Procedure: REPAIR FALSE ANEURYSM;  Surgeon: Conrad Inwood, MD;  Location: [place];  Service: Vascular;  Laterality: Left;  . Endovascular repair of popliteal artery aneurysm Left 12/29/2012    Procedure: ENDOVASCULAR REPAIR OF POPLITEAL AND FEMORAL ARTERY ANEURYSM;  Surgeon: Conrad Haskell, MD;  Location: [place];  Service: Vascular;  Laterality: Left;  . Abdominal aortagram N/A 09/27/2012    Procedure: ABDOMINAL Maxcine Ham;  Surgeon: Angelia Mould, MD;  Location: [facility] CATH LAB;  Service: Cardiovascular;  Laterality: N/A;  . Tonsillectomy and adenoidectomy     Family History  Problem Relation Age of Onset  . Hypertension Mother   . Diabetes Mother   . Diabetes Brother    Social History:  reports that he quit smoking about 7 years ago. His smoking use included Pipe, Cigars, and Cigarettes. He quit after 50 years of use. He has never used smokeless tobacco. He reports that he drinks alcohol. He reports that he does not use illicit drugs. No Known Allergies Prior to Admission  medications   Medication Sig Start Date End Date Taking? Authorizing Provider  Albuterol Sulfate 108 (90 BASE) MCG/ACT AEPB Inhale 2 puffs into the lungs every 4 (four) hours as needed (wheezing).    Yes Historical Provider, MD  amLODipine (NORVASC) 5 MG tablet Take 5 mg by mouth daily.  02/01/15  Yes Historical Provider, MD  bicalutamide (CASODEX) 50 MG tablet Take 1 tablet (50 mg total) by mouth  daily. 06/16/15  Yes Milagros Loll, MD  clopidogrel (PLAVIX) 75 MG tablet Take 1 tablet (75 mg total) by mouth daily. 06/02/14  Yes Conrad [place], MD  gabapentin (NEURONTIN) 100 MG capsule Take 1 capsule (100 mg total) by mouth 2 (two) times daily. 06/16/15  Yes Milagros Loll, MD  HYDROcodone-acetaminophen (NORCO/VICODIN) 5-325 MG per tablet Take 1-2 tablets by mouth every 6 (six) hours as needed for moderate pain or severe pain. 06/17/15  Yes Milagros Loll, MD  lanthanum (FOSRENOL) 1000 MG chewable tablet Chew 1,000 mg by mouth 2 (two) times daily with a meal.   Yes Historical Provider, MD  metoprolol tartrate (LOPRESSOR) 25 MG tablet Take 25 mg by mouth 2 (two) times daily.   Yes Historical Provider, MD  multivitamin (RENA-VIT) TABS tablet Take 1 tablet by mouth daily.   Yes Historical Provider, MD  omeprazole (PRILOSEC) 40 MG capsule Take 40 mg by mouth daily.   Yes Historical Provider, MD  simvastatin (ZOCOR) 40 MG tablet Take 40 mg by mouth every evening.   Yes Historical Provider, MD   Current Facility-Administered Medications  Medication Dose Route Frequency Provider Last Rate Last Dose  . 0.9 %  sodium chloride infusion  250 mL Intravenous PRN [name], DO      . acetaminophen (TYLENOL) tablet 650 mg  650 mg Oral Q4H PRN [name], DO      . aspirin EC tablet 81 mg  81 mg Oral Daily [name], DO      . bicalutamide (CASODEX) tablet 50 mg  50 mg Oral Daily [name], DO      . clopidogrel (PLAVIX) tablet 75 mg  75 mg Oral Daily [name], DO      . gabapentin (NEURONTIN) capsule 100 mg  100 mg Oral BID [name], DO      . heparin injection 5,000 Units  5,000 Units Subcutaneous 3 times per day [name], DO      . HYDROcodone-acetaminophen (NORCO/VICODIN) 5-325 MG per tablet 1-2 tablet  1-2 tablet Oral Q6H PRN [name], DO      . lanthanum [facility]) chewable tablet 1,000  mg  1,000 mg Oral BID WC [name], DO   1,000 mg at 06/26/15 1610  . metoprolol tartrate (LOPRESSOR) tablet 25 mg  25 mg Oral BID [name], DO      . multivitamin (RENA-VIT) tablet 1 tablet  1 tablet Oral QHS Devonne Doughty Karamalegos, DO      . ondansetron [facility]) injection 4 mg  4 mg Intravenous Q6H PRN [name], DO      . pantoprazole (PROTONIX) EC tablet 40 mg  40 mg Oral Daily [name], DO      . senna-docusate (Senokot-S) tablet 1 tablet  1 tablet Oral QHS [name], DO      . simvastatin (ZOCOR) tablet 40 mg  40 mg Oral QPM Alexander J Karamalegos, DO      . sodium chloride 0.9 % injection  3 mL  3 mL Intravenous Q12H [name], DO   3 mL at 06/26/15 0330  . sodium chloride 0.9 % injection 3 mL  3 mL Intravenous PRN [name], DO       Labs: Basic Metabolic Panel:  Recent Labs Lab 06/26/15 0349  NA 143  K 5.6*  CL 99*  CO2 26  GLUCOSE 108*  BUN 56*  CREATININE 7.51*  CALCIUM 8.3*   Liver Function Tests: No results for input(s): AST, ALT, ALKPHOS, BILITOT, PROT, ALBUMIN in the last 168 hours. No results for input(s): LIPASE, AMYLASE in the last 168 hours. No results for input(s): AMMONIA in the last 168 hours. CBC:  Recent Labs Lab 06/26/15 0349  WBC 6.9  NEUTROABS 4.5  HGB 8.8*  HCT 28.7*  MCV 96.6  PLT 81*   Cardiac Enzymes:  Recent Labs Lab 06/26/15 0349 06/26/15 0922  TROPONINI 0.04* 0.04*   CBG: No results for input(s): GLUCAP in the last 168 hours. Iron Studies: No results for input(s): IRON, TIBC, TRANSFERRIN, FERRITIN in the last 72 hours. Studies/Results: Dg Chest Port 1 View  06/26/2015   CLINICAL DATA:  Diastolic CHF acute on chronic, hypoxia, dyspnea  EXAM: PORTABLE CHEST - 1 VIEW  COMPARISON:  Portable exam 0335 hr compared to 06/25/2015 and correlated with CT chest of 06/25/2015  FINDINGS: Enlargement of cardiac silhouette with vascular  congestion.  Atherosclerotic calcification aorta.  Scattered pulmonary infiltrates likely representing pulmonary edema and CHF.  Bibasilar effusions.  No pneumothorax.  Scattered sclerotic osseous lesions are seen compatible with osseous metastatic disease.  IMPRESSION: Persistent CHF with bibasilar effusions.  Sclerotic osseous metastatic disease.   Electronically Signed   By: Lavonia Dana M.D.   On: 06/26/2015 04:07    ROS: As per HPI otherwise negative.   Physical Exam: Filed Vitals:   06/26/15 0735 06/26/15 1113 06/26/15 1124 06/26/15 1130  BP: 137/76 150/90 126/78 140/84  Pulse: 74 75 71 71  Temp: 97.7 F (36.5 C) 97.8 F (36.6 C)    TempSrc: Oral Oral    Resp: 18 27 22 22   Height:      Weight:  78.8 kg (173 lb 11.6 oz)    SpO2: 95% 99%       General: Chronically Ill appearing male with moderate distress due to shortness of breath.  Head: Normocephalic, atraumatic, sclera non-icteric, mucus membranes are moist Neck: Supple. JVD elevated to mandible.  Lungs: Bilateral breath sounds with bibasilar crackles, scattered expiratory wheezes. Unable to lie flat. WOB/Tachypnea present. Heart: RRR with S1 S2.No M/G/R.  Abdomen: Soft, non-tender, non-distended with normoactive bowel sounds. No rebound/guarding. No obvious abdominal masses. Lower extremities:3+ pitting LE edema.  Neuro: Alert and oriented X 3. Moves all extremities spontaneously. Psych:  Responds to questions appropriately with a normal affect. Dialysis Access: LFA AVF + thrill/+ Bruit  Dialysis: [facility]  4h   73.5kg  3/ 2.25 bath LFA AVF   Doxercalciferol 7 mcg IV TTS (last dose 06/23/2015) Mircera 75 mcg IVP Every 2 weeks (last dose 06/12/2015)   Assessment/Plan: 1.   Acute on Chronic Diastolic HF:  Cardiology consulted. HD today for volume overload.  2.   Chest Pain: Per primary/cards. Troponin minimally elevated.  3.   ESRD -  TTS at [facility]. For HD today.  4.  Hypertension/volume  -Wt 78.8 KG today. OP EDW  73.5. Will attempt to UF 4-5 KG today. Metoprolol 25 mg PO BID.  5.  Anemia  - 8.8. Will start  ESA.  6.  Metabolic bone disease -  Ca 8.3. Continue OP binders.  7.  Nutrition - last albumin 3.0 (06/14/15). Renal diet w/fld restrictions. Renal vit. Supplements.  8.  Metastatic prostate cancer:  Pain management per primary  Rita H. Owens Shark, NP-C 06/26/2015, 11:50 AM  [employer] [phone]  Pt seen, examined and agree w A/P as above. 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  Pulm edema by CXR, 6 kg over his dry wt.  Plan HD today asap to get volume down. Believe he has started oral chemo recommended by his oncologist.   Kelly Splinter MD pager [phone]    cell [phone] 06/26/2015, 1:19 PM

## 2015-06-26 NOTE — Progress Notes (Signed)
Brief Progress Note: Pt is doing well this morning. States his breathing is much better than last night. Has been weaned down from 2L O2 last night to 1.5L O2 this morning. Has made a very small amount of urine.  On exam, he is alert and conversational, breathing comfortably with nasal cannula in place. Crackles in lung bases bilaterally, lungs are otherwise clear. 3+ pitting edema in lower extremities bilaterally.  Plan: - Nephro has been consulted, they will come see him today. HD T-Th-Sat. - HF consult today - Palliative Care consult today - Trend troponins - Wean O2 today as tolerated   Hyman Bible, MD PGY-1

## 2015-06-26 NOTE — Progress Notes (Signed)
Pt. Arrived to the unit via stretcher in alert and stable condition. Pt. denies pain at this time. Vitals taken, pt. O2 sats 96% on 2L, BP 150/96, HR 70. Pt. Having SOB with exertion. Pt. In no distress at this time. FMTS paged and at pts. Bedside. Pt. Oriented to room and call light placed within reach. CCMD notified of pts. Arrival to unit and pt. Placed on telemetry. RN will continue to monitor pt. For changes in condition. Tritia Endo, Katherine Roan

## 2015-06-27 ENCOUNTER — Ambulatory Visit (HOSPITAL_COMMUNITY): Payer: Medicare Other

## 2015-06-27 ENCOUNTER — Encounter (HOSPITAL_COMMUNITY): Payer: Self-pay | Admitting: Family Medicine

## 2015-06-27 DIAGNOSIS — R0600 Dyspnea, unspecified: Secondary | ICD-10-CM

## 2015-06-27 DIAGNOSIS — E440 Moderate protein-calorie malnutrition: Secondary | ICD-10-CM | POA: Diagnosis present

## 2015-06-27 DIAGNOSIS — Z992 Dependence on renal dialysis: Secondary | ICD-10-CM

## 2015-06-27 DIAGNOSIS — I4819 Other persistent atrial fibrillation: Secondary | ICD-10-CM | POA: Diagnosis not present

## 2015-06-27 DIAGNOSIS — I481 Persistent atrial fibrillation: Secondary | ICD-10-CM

## 2015-06-27 DIAGNOSIS — R0902 Hypoxemia: Secondary | ICD-10-CM

## 2015-06-27 DIAGNOSIS — N186 End stage renal disease: Secondary | ICD-10-CM

## 2015-06-27 DIAGNOSIS — Z515 Encounter for palliative care: Secondary | ICD-10-CM

## 2015-06-27 DIAGNOSIS — C419 Malignant neoplasm of bone and articular cartilage, unspecified: Secondary | ICD-10-CM | POA: Diagnosis present

## 2015-06-27 DIAGNOSIS — I5033 Acute on chronic diastolic (congestive) heart failure: Principal | ICD-10-CM

## 2015-06-27 LAB — BASIC METABOLIC PANEL
Anion gap: 13 (ref 5–15)
BUN: 30 mg/dL — AB (ref 6–20)
CHLORIDE: 97 mmol/L — AB (ref 101–111)
CO2: 28 mmol/L (ref 22–32)
Calcium: 8.3 mg/dL — ABNORMAL LOW (ref 8.9–10.3)
Creatinine, Ser: 4.71 mg/dL — ABNORMAL HIGH (ref 0.61–1.24)
GFR calc Af Amer: 13 mL/min — ABNORMAL LOW (ref >60)
GFR calc non Af Amer: 11 mL/min — ABNORMAL LOW (ref >60)
Glucose, Bld: 95 mg/dL (ref 65–99)
POTASSIUM: 4.4 mmol/L (ref 3.5–5.1)
SODIUM: 138 mmol/L (ref 135–145)

## 2015-06-27 LAB — CBC
HCT: 28.9 % — ABNORMAL LOW (ref 39.0–52.0)
Hemoglobin: 8.9 g/dL — ABNORMAL LOW (ref 13.0–17.0)
MCH: 29.9 pg (ref 26.0–34.0)
MCHC: 30.8 g/dL (ref 30.0–36.0)
MCV: 97 fL (ref 78.0–100.0)
Platelets: 82 10*3/uL — ABNORMAL LOW (ref 150–400)
RBC: 2.98 MIL/uL — AB (ref 4.22–5.81)
RDW: 21.7 % — ABNORMAL HIGH (ref 11.5–15.5)
WBC: 5.3 10*3/uL (ref 4.0–10.5)

## 2015-06-27 MED ORDER — POLYETHYLENE GLYCOL 3350 17 G PO PACK
17.0000 g | PACK | Freq: Every day | ORAL | Status: DC
  Administered 2015-06-27 – 2015-06-28 (×2): 17 g via ORAL
  Filled 2015-06-27 (×3): qty 1

## 2015-06-27 MED ORDER — METOPROLOL TARTRATE 50 MG PO TABS
50.0000 mg | ORAL_TABLET | Freq: Two times a day (BID) | ORAL | Status: DC
  Administered 2015-06-27 – 2015-06-28 (×2): 50 mg via ORAL
  Filled 2015-06-27 (×2): qty 1

## 2015-06-27 NOTE — Discharge Summary (Signed)
Physician Discharge Summary  Patient ID: Tim Kirby MRN: [id number] DOB/AGE: April 03, 1941 74 y.o.  Admit date: 06/26/2015 Discharge date: 06/28/2015  Admission Diagnoses: Acute on chronic CHF exacerbation   Discharge Diagnoses: Same  Hospital Course: Tim Kirby is a 74 y.o. male presenting as transfer from [facility] with worsening dyspnea and edema, consistent with acute on chronic dCHF exacerbation in setting of ESRD, accepted transfer due to req HD. Hospital course, by problem, is as follows:  1. Acute on chronic CHF exacerbation. Patient required 2L O2 due to SOB at admission, and appropriately weaned. Suspect current symptoms may be 2/2 anti-androgen agent biclutamide initated at last admission, carries potential risks of CHF(2-5%), MI(2-5%), peripheral edema(13%), and dyspnea(13%). Per oncology, risk of repeat CHF exacerbation solely due to biclutamide is low, recommend to finish 30 day course, not to be continued beyond that. OP Cardiology/PCP to do: Please follow up with patient regarding the results of his echocardiogram, which was pending at the time of discharge.  2. Atrial fibrillation/flutter. EKG at last admission (06/16/15) showed atrial fibrillation; EKG this admission with atrial flutter (06/27/15). Patient on clopidogrel at home and was continued during this hospitalization; aspirin was added this admission. CHADS2VASC of 3; however given comorbid prostate cancer and hypercoagulable state, anticoagulation may have limited benefit. In setting of multiple comorbidities including metastatic cancer, as well as high falls risk per PT, risk of bleeding may outweigh benefit. Patient does not wish to escalate anticoagulation per Cardiology. Patient was persistently tachycardic to >110, and metoprolol was increased from 25 mg BID --> 50 mg BID for rate control. Note from OP Cardiology indicates that patient was started on amiodarone for rhythm control on 06/13/15; however amiodarone  was not in the original medication reconciliation and therefore was not given this admission. The patient states that he had not started taking this medication because he had not received a prescription. OP Cardiology/PCP to do: For these reasons, recommend discontinuation of clopidogrel and discharge on aspirin alone. Continue to monitor BP and HR, and adjust metoprolol accordingly. Reevaluate need for amiodarone and prescribe if necessary.  3. ESRD on HD. Continued dialysis according to OP schedule TThSat, with additional dialysis session on 9/21. Admit weight 172 lbs, returned to baseline dry weight (159) following dialysis x 2. Hyperkalemia on admission (5.6 from 6.1 at OSH) resolved following Kayexalate.  4. Prostate cancer metastatic to bone. Bicalutamide initiated last admission (06/17/15), suspect may have contributed to CHF exacerbation but continue 30 day course as above. - Biopsy scheduled for week of 9/26 per wife, stressed importance of followup.  5. Normocytic anemia. Given ESRD and known malignancy, likely 2/2 anemia of chronic disease, biclutamide may also be contributory. Epo given at dialysis. Would continue to monitor as outpatient.  6. Palliative care consult initiated goals of care discussion. Patient would like DNR/DNI, but also thinks that his wife is the best person to make that decision. Also provided wife with HCPOA paperwork, to be completed at a later date. PCP to do: Please follow up goals of care discussion and paperwork.  7. Falls risk. PT recommends home health PT, 3 in 1 for shower, and ambulation with rolling walker. Social work to set up home health PT.  Discharge Exam: Blood pressure 118/78, pulse 74, temperature 98 F (36.7 C), temperature source Oral, resp. rate 20, height 5\' 8"  (1.727 m), weight 162 lb 0.6 oz (73.5 kg), SpO2 98 %. General appearance: alert, cooperative and no distress Resp: clear to auscultation bilaterally and no crackles  Cardio: irregularly  irregular rhythm Extremities: edema 1+ up to ankles Pulses: 1+ bilaterally Skin: warm and dry  Disposition: 01-Home or Self Care      Discharge Instructions    Increase activity slowly    Complete by:  As directed             Medication List    TAKE these medications        Albuterol Sulfate 108 (90 BASE) MCG/ACT Aepb  Inhale 2 puffs into the lungs every 4 (four) hours as needed (wheezing).     amLODipine 5 MG tablet  Commonly known as:  NORVASC  Take 5 mg by mouth daily.     aspirin 81 MG EC tablet  Take 1 tablet (81 mg total) by mouth daily.     bicalutamide 50 MG tablet  Commonly known as:  CASODEX  Take 1 tablet (50 mg total) by mouth daily.     gabapentin 100 MG capsule  Commonly known as:  NEURONTIN  Take 1 capsule (100 mg total) by mouth 2 (two) times daily.     gabapentin 100 MG capsule  Commonly known as:  NEURONTIN  Take 1 capsule (100 mg total) by mouth 2 (two) times daily.     HYDROcodone-acetaminophen 5-325 MG per tablet  Commonly known as:  NORCO/VICODIN  Take 1-2 tablets by mouth every 6 (six) hours as needed for moderate pain or severe pain.     lanthanum 1000 MG chewable tablet  Commonly known as:  FOSRENOL  Chew 1,000 mg by mouth 2 (two) times daily with a meal.     metoprolol 50 MG tablet  Commonly known as:  LOPRESSOR  Take 1 tablet (50 mg total) by mouth 2 (two) times daily.     multivitamin Tabs tablet  Take 1 tablet by mouth daily.     omeprazole 40 MG capsule  Commonly known as:  PRILOSEC  Take 40 mg by mouth daily.     simvastatin 40 MG tablet  Commonly known as:  ZOCOR  Take 40 mg by mouth every evening.     simvastatin 40 MG tablet  Commonly known as:  ZOCOR  Take 1 tablet (40 mg total) by mouth every evening.       Follow-up Information    Follow up with [name], MD. Go on 07/09/2015.   Specialty:  Internal Medicine   Why:  3pm appointment, post-hospital follow up.   Contact information:   [place] [place] [address] [phone]       Follow up with [facility] R, MD. Schedule an appointment as soon as possible for a visit in 1 week.   Specialty:  Cardiology   Why:  Medication management, post-hospital follow-up   Contact information:   [address] [place] [address] [phone]       Signed: Noreene Larsson, MS4 06/28/2015, 5:43 PM

## 2015-06-27 NOTE — Consult Note (Signed)
Consultation Note Date: 06/27/2015   [name]  DOB: September 08, 1941  MRN: [id number]  Age / Sex: 74 y.o., male   PCP: Estill Bamberg. Megan Salon, MD Referring Physician: Dickie La, MD  Reason for Consultation: Establishing goals of care  Palliative Care Assessment and Plan Summary of Established Goals of Care and Medical Treatment Preferences  KALEL HARTY is a 74 y.o. male presenting as transfer from [facility] with worsening dyspnea and edema, consistent with acute on chronic dCHF exacerbation in setting of ESRD, accepted transfer due to req HD. PMH is significant for recently diagnosed metastatic prostate cancer, HTN, ESRD on HD, PVD, HLD, CHF, anemia, and gout.   The patient has been admitted with acute on chronic diastolic CHF exacerbation, he is also being followed by renal for his dialysis schedule. The patient has recently been diagnosed with metastatic prostate cancer. A palliative consult has been placed for further discussions.  Discussed with patient, Tim Kirby, brother along with medical student Dr Lisabeth Devoid in the room. The patient's Tim Kirby is the primary historian, she sits on the edge of the bed, the patient is resting in bed. Discussed symptom management such as pain in back and shoulder areas, discussed pertinent symptom management for constipation and nausea. No other symptoms reported. Discussed code status in detail. Discussed that it would be medically appropriate to consider DNR DNI. All questions answered. Medical team to follow up with family in am. Adjust symptom management medications.   Thank you for the consult, palliative services will follow along and help guide further decision making.   Contacts/Participants in Discussion: Primary Decision Maker:  Tim Kirby   HCPOA: yes     Code Status/Advance Care Planning:   full code for now, extensively discussed with patient and his Tim Kirby regarding full code versus DNR DNI.   Tim Kirby is assumed to be the HCPOA  agent, Tim Kirby states they have grown children.  Symptom Management:   Pain in shoulder and back: patient on hydrocodone, discussed PO Oxycodone PRN. Patient is opioid naive.   Constipation: discussed bowel regimen. Patient to be started on miralax  ocassional nausea: no vomiting. PRN Zofran  Palliative Prophylaxis: yes, as above  Additional Recommendations (Limitations, Scope, Preferences):  Discussed DNR DNI in detail, full code for now, patient/family to have further discussions, medical team to follow up Psycho-social/Spiritual:   Support System: yes, Tim Kirby. Has grown children. Lives locally  Desire for further Chaplaincy support:no  Prognosis: likely  < 6 months  Discharge Planning:  pending further discussions.     Values:  Continue to assess, at this stage wishes to confer with all specialists, oncology consult pending.  Life limiting illness: metastatic prostate cancer in a elderly gentleman with ESRD on HD, history of CHF      Chief Complaint/History of Present Illness: dyspnea.   Primary Diagnoses  Present on Admission:  . Diastolic CHF, acute on chronic . Dyspnea . End stage renal disease on dialysis . Malnutrition of moderate degree  Palliative Review of Systems: Completed  I have reviewed the medical record, interviewed the patient and family, and examined the patient. The following aspects are pertinent.  Past Medical History  Diagnosis Date  . Hyperlipidemia   . Gout   . Secondary hyperparathyroidism   . Anemia   . Colon, diverticulosis   . Thyroid nodule   . Anginal pain     sees Dr. Lia Foyer  . Hypertension     sees Dr. Willy Eddy  . Aneurysm artery, popliteal   .  Upper respiratory infection April 2016  . CHF (congestive heart failure)   . Aneurysm of right femoral artery   . ESRD (end stage renal disease) on dialysis     "TTS; [place]" (06/15/2015)  . Renal insufficiency    Social History   Social History  . Marital Status: Married     Spouse Name: N/A  . Number of Children: N/A  . Years of Education: N/A   Social History Main Topics  . Smoking status: Former Smoker -- 50 years    Types: Pipe, Cigars, Cigarettes    Quit date: 09/18/2007  . Smokeless tobacco: Never Used     Comment: "mostly smoked cigars and pipes"  . Alcohol Use: Yes     Comment: "stopped drinking in ~ 2008"  . Drug Use: No  . Sexual Activity: Not Currently   Other Topics Concern  . Not on file   Social History Narrative   Family History  Problem Relation Age of Onset  . Hypertension Mother   . Diabetes Mother   . Diabetes Brother    Scheduled Meds: . aspirin EC  81 mg Oral Daily  . bicalutamide  50 mg Oral Daily  . clopidogrel  75 mg Oral Daily  . darbepoetin (ARANESP) injection - DIALYSIS  60 mcg Intravenous Q Tue-HD  . [START ON 06/28/2015] doxercalciferol  7 mcg Intravenous Q T,Th,Sa-HD  . feeding supplement (NEPRO CARB STEADY)  237 mL Oral BID BM  . gabapentin  100 mg Oral BID  . heparin  5,000 Units Subcutaneous 3 times per day  . lanthanum  1,000 mg Oral TID WC  . metoprolol tartrate  50 mg Oral BID  . multivitamin  1 tablet Oral QHS  . pantoprazole  40 mg Oral Daily  . senna-docusate  1 tablet Oral QHS  . simvastatin  40 mg Oral QPM  . sodium chloride  3 mL Intravenous Q12H   Continuous Infusions:  PRN Meds:.sodium chloride, acetaminophen, HYDROcodone-acetaminophen, ondansetron (ZOFRAN) IV, sodium chloride Medications Prior to Admission:  Prior to Admission medications   Medication Sig Start Date End Date Taking? Authorizing Provider  Albuterol Sulfate 108 (90 BASE) MCG/ACT AEPB Inhale 2 puffs into the lungs every 4 (four) hours as needed (wheezing).    Yes Historical Provider, MD  amLODipine (NORVASC) 5 MG tablet Take 5 mg by mouth daily.  02/01/15  Yes Historical Provider, MD  bicalutamide (CASODEX) 50 MG tablet Take 1 tablet (50 mg total) by mouth daily. 06/16/15  Yes Milagros Loll, MD  clopidogrel (PLAVIX) 75 MG tablet  Take 1 tablet (75 mg total) by mouth daily. 06/02/14  Yes Conrad Pasco, MD  gabapentin (NEURONTIN) 100 MG capsule Take 1 capsule (100 mg total) by mouth 2 (two) times daily. 06/16/15  Yes Milagros Loll, MD  HYDROcodone-acetaminophen (NORCO/VICODIN) 5-325 MG per tablet Take 1-2 tablets by mouth every 6 (six) hours as needed for moderate pain or severe pain. 06/17/15  Yes Milagros Loll, MD  lanthanum (FOSRENOL) 1000 MG chewable tablet Chew 1,000 mg by mouth 2 (two) times daily with a meal.   Yes Historical Provider, MD  metoprolol tartrate (LOPRESSOR) 25 MG tablet Take 25 mg by mouth 2 (two) times daily.   Yes Historical Provider, MD  multivitamin (RENA-VIT) TABS tablet Take 1 tablet by mouth daily.   Yes Historical Provider, MD  omeprazole (PRILOSEC) 40 MG capsule Take 40 mg by mouth daily.   Yes Historical Provider, MD  simvastatin (ZOCOR) 40 MG tablet Take 40  mg by mouth every evening.   Yes Historical Provider, MD   No Known Allergies CBC:    Component Value Date/Time   WBC 5.3 06/27/2015 0315   HGB 8.9* 06/27/2015 0315   HCT 28.9* 06/27/2015 0315   PLT 82* 06/27/2015 0315   MCV 97.0 06/27/2015 0315   NEUTROABS 4.5 06/26/2015 0349   LYMPHSABS 2.1 06/26/2015 0349   MONOABS 0.3 06/26/2015 0349   EOSABS 0.0 06/26/2015 0349   BASOSABS 0.0 06/26/2015 0349   Comprehensive Metabolic Panel:    Component Value Date/Time   NA 138 06/27/2015 0315   K 4.4 06/27/2015 0315   CL 97* 06/27/2015 0315   CO2 28 06/27/2015 0315   BUN 30* 06/27/2015 0315   CREATININE 4.71* 06/27/2015 0315   CREATININE 9.25* 10/01/2012 1510   GLUCOSE 95 06/27/2015 0315   CALCIUM 8.3* 06/27/2015 0315   AST 34 06/14/2015 1132   ALT 18 06/14/2015 1132   ALKPHOS 1072* 06/14/2015 1132   BILITOT 0.7 06/14/2015 1132   PROT 5.9* 06/14/2015 1132   ALBUMIN 3.6 06/15/2015 1902    Physical Exam: Vital Signs: BP 126/81 mmHg  Pulse 118  Temp(Src) 97.7 F (36.5 C) (Oral)  Resp 20  Ht 5\' 8"  (1.727 m)  Wt 72.2 kg  (159 lb 2.8 oz)  BMI 24.21 kg/m2  SpO2 99% SpO2: SpO2: 99 % O2 Device: O2 Device: Nasal Cannula O2 Flow Rate: O2 Flow Rate (L/min): 2 L/min Intake/output summary:  Intake/Output Summary (Last 24 hours) at 06/27/15 1627 Last data filed at 06/27/15 1409  Gross per 24 hour  Intake    960 ml  Output   3050 ml  Net  -2090 ml   LBM: Last BM Date: 06/26/15 Baseline Weight: Weight: 78.2 kg (172 lb 6.4 oz) (scale a) Most recent weight: Weight: 72.2 kg (159 lb 2.8 oz) (standing.)  Exam Findings:   NAD Elderly gentleman resting in bed Chest clear Abdomen non tender No edema Non focal                        Palliative Performance Scale: 40% Additional Data Reviewed: Recent Labs     06/26/15  0349  06/27/15  0315  WBC  6.9  5.3  HGB  8.8*  8.9*  PLT  81*  82*  NA  143  138  BUN  56*  30*  CREATININE  7.51*  4.71*     Time In: 1530 Time Out: 1630 Time Total: 60 min  Greater than 50%  of this time was spent counseling and coordinating care related to the above assessment and plan.  Signed by: Loistine Chance, MD Secor, MD  06/27/2015, 4:27 PM  Please contact Palliative Medicine Team phone at [phone] for questions and concerns.

## 2015-06-27 NOTE — Progress Notes (Signed)
PT Cancellation Note  Patient Details Name: WESTLEE DEVITA MRN: [id number] DOB: 01/27/1941   Cancelled Treatment:    Reason Eval/Treat Not Completed: Patient at procedure or test/unavailable Palliative care currently having meeting with patient and family. Will follow up next available time to perform PT evaluation.   Marguarite Arbour A Hartshorne 06/27/2015, 4:00 PM Wray Kearns, [place], DPT [phone]

## 2015-06-27 NOTE — Progress Notes (Signed)
[facility] Progress Note   Subjective: breathing much better, 5kg off w HD yesterday  Filed Vitals:   06/27/15 0900 06/27/15 0930 06/27/15 1000 06/27/15 1018  BP: 120/59 110/80 123/74 135/89  Pulse: 90 90 97 90  Temp:    98.2 F (36.8 C)  TempSrc:    Oral  Resp: 20 20 20 18   Height:      Weight:    72.2 kg (159 lb 2.8 oz)  SpO2:    99%   Exam: General: Chronically Ill appearing male with moderate distress due to shortness of breath.  Head: Normocephalic, atraumatic, sclera non-icteric, mucus membranes are moist Neck: Supple. JVD elevated to mandible.  Lungs: Bilateral breath sounds with bibasilar crackles, scattered expiratory wheezes. Unable to lie flat. WOB/Tachypnea present. Heart: RRR with S1 S2.No M/G/R.  Abdomen: Soft, non-tender, non-distended with normoactive bowel sounds. No rebound/guarding. No obvious abdominal masses. Lower extremities:3+ pitting LE edema.  Neuro: Alert and oriented X 3. Moves all extremities spontaneously. Psych: Responds to questions appropriately with a normal affect. Dialysis Access: LFA AVF + thrill/+ Bruit  Dialysis: [facility] TTS 4h 73.5kg 3/ 2.25 bath LFA AVF  Doxercalciferol 7 mcg IV TTS (last dose 06/23/2015) Mircera 75 mcg IVP Every 2 weeks (last dose 06/12/2015)   Assessment/Plan: 1.  Acute pulm edema/ vol overload - better after HD yest. Still has volume to get.  2.  Chest Pain: minimally ^trop  3.  ESRD - TTS at [facility]. Extra HD today for volume.  4. HTN - cont MTP 25 bid for now, may need to decrease dose if BP's drop further 5. Anemia - 8.8. Will start ESA.  6. Metabolic bone disease - Ca 8.3. Continue OP binders.  7. Nutrition - last albumin 3.0 (06/14/15). Renal diet w/fld restrictions. Renal vit. Supplements.  8. Metastatic prostate cancer: Pain management per primary  Plan - extra HD today , get vol down. Should be ok for dc after HD today to resume TTS schedule at OP HD tomorrow.  Explained situation to patient and discussed importance of salt and fluid restrictions. Questions answered.     Kelly Splinter MD  pager [phone]    cell [phone]  06/27/2015, 11:18 AM     Recent Labs Lab 06/26/15 0349 06/27/15 0315  NA 143 138  K 5.6* 4.4  CL 99* 97*  CO2 26 28  GLUCOSE 108* 95  BUN 56* 30*  CREATININE 7.51* 4.71*  CALCIUM 8.3* 8.3*   No results for input(s): AST, ALT, ALKPHOS, BILITOT, PROT, ALBUMIN in the last 168 hours.  Recent Labs Lab 06/26/15 0349 06/27/15 0315  WBC 6.9 5.3  NEUTROABS 4.5  --   HGB 8.8* 8.9*  HCT 28.7* 28.9*  MCV 96.6 97.0  PLT 81* 82*   . aspirin EC  81 mg Oral Daily  . bicalutamide  50 mg Oral Daily  . clopidogrel  75 mg Oral Daily  . darbepoetin (ARANESP) injection - DIALYSIS  60 mcg Intravenous Q Tue-HD  . [START ON 06/28/2015] doxercalciferol  7 mcg Intravenous Q T,Th,Sa-HD  . feeding supplement (NEPRO CARB STEADY)  237 mL Oral BID BM  . gabapentin  100 mg Oral BID  . heparin  5,000 Units Subcutaneous 3 times per day  . lanthanum  1,000 mg Oral TID WC  . metoprolol tartrate  25 mg Oral BID  . multivitamin  1 tablet Oral QHS  . pantoprazole  40 mg Oral Daily  . senna-docusate  1 tablet Oral QHS  . simvastatin  40 mg Oral QPM  . sodium chloride  3 mL Intravenous Q12H     sodium chloride, acetaminophen, HYDROcodone-acetaminophen, ondansetron (ZOFRAN) IV, sodium chloride

## 2015-06-27 NOTE — Progress Notes (Signed)
[name] Date of Encounter: 06/27/2015  Active Problems:   Diastolic CHF, acute on chronic   Dyspnea   Pt. Profile: 74 year old male with past medical history of end-stage renal disease, hypertension, hyperlipidemia, chronic diastolic congestive heart failure, metastatic prostate cancer for evaluation of acute on chronic diastolic congestive heart failure.  SUBJECTIVE  Feeling well. No chest pain, sob or palpitations. Had HD early morning today. Tele was concerning for afib. Patient does have hx of PAF.   CURRENT MEDS . aspirin EC  81 mg Oral Daily  . bicalutamide  50 mg Oral Daily  . clopidogrel  75 mg Oral Daily  . darbepoetin (ARANESP) injection - DIALYSIS  60 mcg Intravenous Q Tue-HD  . [START ON 06/28/2015] doxercalciferol  7 mcg Intravenous Q T,Th,Sa-HD  . feeding supplement (NEPRO CARB STEADY)  237 mL Oral BID BM  . gabapentin  100 mg Oral BID  . heparin  5,000 Units Subcutaneous 3 times per day  . lanthanum  1,000 mg Oral TID WC  . metoprolol tartrate  25 mg Oral BID  . multivitamin  1 tablet Oral QHS  . pantoprazole  40 mg Oral Daily  . senna-docusate  1 tablet Oral QHS  . simvastatin  40 mg Oral QPM  . sodium chloride  3 mL Intravenous Q12H    OBJECTIVE  Filed Vitals:   06/27/15 0900 06/27/15 0930 06/27/15 1000 06/27/15 1018  BP: 120/59 110/80 123/74 135/89  Pulse: 90 90 97 90  Temp:    98.2 F (36.8 C)  TempSrc:    Oral  Resp: 20 20 20 18   Height:      Weight:    159 lb 2.8 oz (72.2 kg)  SpO2:    99%    Intake/Output Summary (Last 24 hours) at 06/27/15 1152 Last data filed at 06/27/15 1018  Gross per 24 hour  Intake    720 ml  Output   7050 ml  Net  -6330 ml   Filed Weights   06/27/15 0440 06/27/15 0717 06/27/15 1018  Weight: 165 lb 8 oz (75.07 kg) 167 lb 8.8 oz (76 kg) 159 lb 2.8 oz (72.2 kg)    PHYSICAL EXAM  General: Pleasant, NAD. Neuro: Alert and oriented X 3. Moves all extremities spontaneously. Psych: Normal  affect. HEENT:  Normal  Neck: Supple without bruits or JVD. Lungs:  Resp regular and unlabored, CTA. Heart: RRR no s3, s4, or murmurs. Abdomen: Soft, non-tender, non-distended, BS + x 4.  Extremities: No clubbing, cyanosis or edema. DP/PT/Radials 2+ and equal bilaterally.  Accessory Clinical Findings  CBC  Recent Labs  06/26/15 0349 06/27/15 0315  WBC 6.9 5.3  NEUTROABS 4.5  --   HGB 8.8* 8.9*  HCT 28.7* 28.9*  MCV 96.6 97.0  PLT 81* 82*   Basic Metabolic Panel  Recent Labs  06/26/15 0349 06/27/15 0315  NA 143 138  K 5.6* 4.4  CL 99* 97*  CO2 26 28  GLUCOSE 108* 95  BUN 56* 30*  CREATININE 7.51* 4.71*  CALCIUM 8.3* 8.3*  MG 2.1  --    Cardiac Enzymes  Recent Labs  06/26/15 0349 06/26/15 0922  TROPONINI 0.04* 0.04*    TELE  afib at rate of 110s  Radiology/Studies   ASSESSMENT AND PLAN  1 acute on chronic diastolic congestive heart failure -Had HD this morning (4L) as well as yesterday (3L).  - Continue BB - Last ECHO 01/03/15: EF 55%, moderate LVH, Grade II diastolic dysfunction. Pending  repeat echo.   2 Chest tightness - Now resolved. Trop 0.04->0.04.   3 end-stage renal disease -this appears to be the predominant issue related to volume excess. Nephrology consult pending. Will need lower dry weight.  4 metastatic prostate cancer  5 hypertension -continue present blood pressure medications. If low blood pressure becomes a problem related to decreasing his dry weight would discontinue Norvasc.  6. PAF - Tele concerning for Afib. Will get EKG.  - EKG yesterday showed sinus rhythm. On previous EKG he was in Sinus rhythm.  - He dose remember hx of afib and being on anticoagulation in past. Currently not on any blood thinner on home regimen. Admits to taking anticoagulation in past, however unable to provide name and why it stopped. Likely now off due to malignancy and hx of chronic anemia. Currently on plavix due to PAD. - CHADSVASC score of at  least 4 (age, CHF, HTN and vascular disease).  - Continue BB.   7. 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 He also has a right common iliac aneurysm. Follows with Dr. Bridgett Larsson. - Continue Plavix   Signed, Bhagat,Bhavinkumar PA-C Pager [phone]  I have personally seen and examined this patient with B. Bhagat, PA-C. I agree with the assessment and plan as outlined above. Pt admitted with acute on chronic diastolic CHF. Volume removal with HD and improvement in symptoms. He is in rate controlled atrial fib/flutter. CHADS VASC 4. He appears to have been in atrial fibrillation since at least 05/05/15. During other hospitalizations, there was mention of possible anticoagulation but this was not started. Would consider anti-coagulation unless this is felt to be contraindicated with his anemia and malignancy.   MCALHANY,CHRISTOPHER 06/27/2015 1:31 PM

## 2015-06-27 NOTE — Progress Notes (Signed)
Family Medicine Teaching Service Daily Progress Note Intern Pager: [phone]  [name] Medical record number: [id number] Date of birth: 1941/08/03 Age: 74 y.o. Gender: male  Primary Care Provider: [name], MD Consultants: Nephrology Code Status: FULL  Pt Overview and Major Events to Date:  None  Assessment and Plan: Tim Kirby is a 74 y.o. male presenting as transfer from [facility] with worsening dyspnea and edema, consistent with acute on chronic dCHF exacerbation in setting of ESRD, accepted transfer due to req HD. PMH is significant for recently diagnosed metastatic prostate cancer, HTN, ESRD on HD, PVD, HLD, CHF, anemia, and gout.   Acute on chronic CHF (Diastolic) Exacerbation: Last ECHO 01/03/15: EF 55%, moderate LVH, Grade II diastolic dysfunction. Last stress test myocardial perfusion scan 10/2012 was negative for ischemia. Appears fluid overloaded on exam and BP is elevated at presentation. CXR showing persistent CHF with bibasilar effusions. Requiring 2L O2 via [place]. Weight was 165 at last hospitalization and was 172 this admission. Weight is 167 this morning.  - Nephrology following, appreciate recommendations. Received HD yesterday, took off ~4L of fluid. Receiving dialysis again today. - Cardiology following, appreciate recommendations. Recommend having additional fluid taken off at HD. If hypotension, should discontinue Norvasc.  - Will f/u repeat ECHO - Supplemental O2 prn for sats <92%  - Continuous pulse ox - Continue home metoprolol from 25 mg BID - daily weights; strict I&Os - Incentive spirometry  Chest Tightness, Rule out MI: Per patient's description sounds related to his SOB, possibly with bone pain from multiple bone mets including sternum and ribs. However, due to his multiple risk factors will r/o ACS.  - Troponin negative x 2 - EKG showing NSR, non-specific ST abnormalities - Will continue to monitor for chest  pain  Afib: CHADSVASC is a 3. In Afib with HRs in the 80s-90s this morning, but HRs up to 120 overnight. EKG from previous admission showing Afib. Was in A flutter during that admission and then went into Afib. EKG this admission showed NSR. - Metoprolol 25mg  BID. Will increase to 50mg  bid today. - Will speak with Cardiology today about starting anticoagulation, given the fact that he will need a bone biopsy for tissue diagnosis in the near future.  ESRD on HD: T/TH/Sat dialysis. Has not missed any HD outpatients. K was 6.1 at [facility] and patient was given Kayexelate x 1. K improved to 4.0 after Kayexylate and is 4.4 this morning after dialysis yesterday. - Receiving HD again today. - Nephrology following, appreciate recommendations. (see above) - Will monitor with daily BMETs   HTN: BP ranging from 137-149/80-90 overnight.  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 Reports he has been taking and that he is getting established with same Oncologist who saw him at [facility]. Has been taking Vicodin prn for skeletal pain; notes that it is improved since discharge.  - Pain control: Gabapentin 100 mg BID, Vicodin 1-2 tablets q6 hours PRN - Palliative care consult for discussion of goals of care, appreciate recommendations - Continue Bicalutamide 50mg  daily, (anti-androgen therapy) - Holding on Oncology consult for now  Constipation: Reports chronic constipation relieved with OTC medications. Likely worsened with narcotic use. Had 2 BMs yesterday. - Senakot-S daily at bedtime   HLD: - Continue home Zocor 40 mg   Anemia: Patient has h/o of anemia. Given ESRD, most likely has  anemia of chronic disease. Baseline appears to be 9-10. Hgb today is 8.9. - Monitor with daily CBCs   PAD: Patient is s/p right femoral to below knee popliteal bypass graft with popliteal aneurysm  ligation on 10/18/2012 and left popliteal artery stent for aneurysm on 12/29/2012. He also has a right common iliac aneurysm. Follows with Dr. Bridgett Larsson. - Continue Plavix   FEN/GI: Renal diet with fluid restriction; Saline Lock IV PPx: Heparin  Disposition: Pending clinical improvement  Subjective:  Doing well this morning, states breathing is better than yesterday. No back pain. No concerns.  Objective: Temp:  [97.7 F (36.5 C)-98.4 F (36.9 C)] 97.8 F (36.6 C) (09/21 0440) Pulse Rate:  [71-119] 82 (09/21 0440) Resp:  [17-27] 18 (09/21 0440) BP: (122-150)/(67-98) 149/83 mmHg (09/21 0440) SpO2:  [95 %-100 %] 99 % (09/21 0440) Weight:  [164 lb 3.9 oz (74.5 kg)-173 lb 11.6 oz (78.8 kg)] 165 lb 8 oz (75.07 kg) (09/21 0440) Physical Exam: General: Elderly man lying in bed in NAD, receiving HD HEENT: [place]/AT, EOMI, MMM Neck: Full ROM. Cardiovascular: Irregularly irregular rhythm, normal rate. No m/r/g. Faint distal pulses.  Respiratory: [place] in place. Breathing comfortably. Diminished lung sounds on left. Diffuse crackles on the right.  Abdomen: +BS, soft, NTND MSK: 2+ edema in lower extremities, improved from yesterday. Skin: Warm and dry  Neuro: Alert and oriented. No gross deficits noted.  Psych: Appropriate mood and affect.   Laboratory:  Recent Labs Lab 06/26/15 0349 06/27/15 0315  WBC 6.9 5.3  HGB 8.8* 8.9*  HCT 28.7* 28.9*  PLT 81* 82*    Recent Labs Lab 06/26/15 0349 06/27/15 0315  NA 143 138  K 5.6* 4.4  CL 99* 97*  CO2 26 28  BUN 56* 30*  CREATININE 7.51* 4.71*  CALCIUM 8.3* 8.3*  GLUCOSE 108* 95    Imaging/Diagnostic Tests:   Sela Hua, MD 06/27/2015, 7:27 AM PGY-1, [place] Intern pager: [phone], text pages welcome

## 2015-06-27 NOTE — Progress Notes (Signed)
Initial Nutrition Assessment  DOCUMENTATION CODES:   Non-severe (moderate) malnutrition in context of chronic illness  INTERVENTION:  Continue Nepro Shake po BID, each supplement provides 425 kcal and 19 grams protein   NUTRITION DIAGNOSIS:   Malnutrition related to chronic illness as evidenced by moderate depletions of muscle mass, mild depletion of body fat, percent weight loss.   GOAL:   Patient will meet greater than or equal to 90% of their needs   MONITOR:   PO intake, Supplement acceptance, Weight trends, Labs, I & O's  REASON FOR ASSESSMENT:   Malnutrition Screening Tool    ASSESSMENT:   74 y.o. male presenting as transfer from [facility] with worsening dyspnea and edema, consistent with acute on chronic dCHF exacerbation in setting of ESRD, accepted transfer due to req HD. PMH is significant for recently diagnosed metastatic prostate cancer, HTN, ESRD on HD, PVD, HLD, CHF, anemia, and gout.   Pt states that a couple months ago he had a poor appetite and was eating poorly but, he has been eating better recently. Per weight history, pt has lost 10% of his body weight within the past 3 months. He reports feeling better today and eating well. He ate 100% of breakfast which was delayed due to HD this AM; lunch came shortly after, he did not eat much. He has continued to drink Ensure supplements twice daily at home. He is agreeable to receiving Nepro Shakes while admitted.   Labs: low hemoglobin, low chloride, low calcium  Diet Order:  Diet renal with fluid restriction Fluid restriction:: 1200 mL Fluid; Room service appropriate?: Yes; Fluid consistency:: Thin  Skin:  Reviewed, no issues  Last BM:  9/20  Height:   Ht Readings from Last 1 Encounters:  06/26/15 5\' 8"  (1.727 m)    Weight:   Wt Readings from Last 1 Encounters:  06/27/15 159 lb 2.8 oz (72.2 kg)    Ideal Body Weight:  70 kg  BMI:  Body mass index is 24.21 kg/(m^2).  Estimated Nutritional  Needs:   Kcal:  2100-2300  Protein:  90-100 grams  Fluid:  1.2 L  EDUCATION NEEDS:   No education needs identified at this time  [place], LDN Inpatient Clinical Dietitian Pager: [phone] After Hours Pager: [phone]

## 2015-06-28 ENCOUNTER — Inpatient Hospital Stay (HOSPITAL_COMMUNITY): Payer: Medicare Other

## 2015-06-28 ENCOUNTER — Other Ambulatory Visit (HOSPITAL_COMMUNITY): Payer: Medicare Other

## 2015-06-28 ENCOUNTER — Other Ambulatory Visit: Payer: Self-pay | Admitting: Family Medicine

## 2015-06-28 ENCOUNTER — Encounter (HOSPITAL_COMMUNITY): Payer: Self-pay | Admitting: General Practice

## 2015-06-28 DIAGNOSIS — C7951 Secondary malignant neoplasm of bone: Secondary | ICD-10-CM

## 2015-06-28 DIAGNOSIS — I480 Paroxysmal atrial fibrillation: Secondary | ICD-10-CM

## 2015-06-28 DIAGNOSIS — I509 Heart failure, unspecified: Secondary | ICD-10-CM

## 2015-06-28 LAB — CBC
HCT: 29.6 % — ABNORMAL LOW (ref 39.0–52.0)
HEMOGLOBIN: 9.1 g/dL — AB (ref 13.0–17.0)
MCH: 29.9 pg (ref 26.0–34.0)
MCHC: 30.7 g/dL (ref 30.0–36.0)
MCV: 97.4 fL (ref 78.0–100.0)
PLATELETS: 86 10*3/uL — AB (ref 150–400)
RBC: 3.04 MIL/uL — ABNORMAL LOW (ref 4.22–5.81)
RDW: 21.1 % — AB (ref 11.5–15.5)
WBC: 4.3 10*3/uL (ref 4.0–10.5)

## 2015-06-28 LAB — BASIC METABOLIC PANEL
ANION GAP: 13 (ref 5–15)
BUN: 26 mg/dL — ABNORMAL HIGH (ref 6–20)
CALCIUM: 8.3 mg/dL — AB (ref 8.9–10.3)
CO2: 27 mmol/L (ref 22–32)
CREATININE: 4.36 mg/dL — AB (ref 0.61–1.24)
Chloride: 99 mmol/L — ABNORMAL LOW (ref 101–111)
GFR, EST AFRICAN AMERICAN: 14 mL/min — AB (ref >60)
GFR, EST NON AFRICAN AMERICAN: 12 mL/min — AB (ref >60)
GLUCOSE: 93 mg/dL (ref 65–99)
Potassium: 4 mmol/L (ref 3.5–5.1)
Sodium: 139 mmol/L (ref 135–145)

## 2015-06-28 MED ORDER — GABAPENTIN 100 MG PO CAPS: 100.0000 mg | ORAL_CAPSULE | Freq: Two times a day (BID) | ORAL | Status: AC

## 2015-06-28 MED ORDER — SIMVASTATIN 40 MG PO TABS: 40.0000 mg | ORAL_TABLET | Freq: Every evening | ORAL | Status: DC

## 2015-06-28 MED ORDER — METOPROLOL TARTRATE 50 MG PO TABS: 50.0000 mg | ORAL_TABLET | Freq: Two times a day (BID) | ORAL | Status: DC

## 2015-06-28 MED ORDER — DOXERCALCIFEROL 4 MCG/2ML IV SOLN
INTRAVENOUS | Status: AC
  Filled 2015-06-28: qty 4

## 2015-06-28 MED ORDER — ASPIRIN 81 MG PO TBEC: 81.0000 mg | DELAYED_RELEASE_TABLET | Freq: Every day | ORAL | Status: DC

## 2015-06-28 NOTE — Evaluation (Signed)
Physical Therapy Evaluation Patient Details Name: Tim Kirby MRN: [id number] DOB: 27-Apr-1941 Today's Date: 06/28/2015   History of Present Illness  74 y.o. male presenting as transfer from [facility] with worsening dyspnea and edema, consistent with acute on chronic dCHF exacerbation in setting of ESRD, accepted transfer due to req HD. PMH is significant for recently diagnosed metastatic prostate cancer, HTN, ESRD on HD, PVD, HLD, CHF, anemia, and gout  Clinical Impression  Pt pleasant with decreased cognition and balance as well as generalized weakness. Pt will benefit from acute therapy to maximize gait and strength to decrease falls and burden of care.     Follow Up Recommendations Home health PT;Supervision for mobility/OOB    Equipment Recommendations  3in1 (PT) (for shower)    Recommendations for Other Services       Precautions / Restrictions Precautions Precautions: Fall      Mobility  Bed Mobility Overal bed mobility: Needs Assistance Bed Mobility: Supine to Sit     Supine to sit: Min assist     General bed mobility comments: assist to bring legs off of surface as well as elevate trunk from surface with cues and use of rail   Transfers Overall transfer level: Needs assistance     Sit to Stand: Supervision         General transfer comment: cues for hand placement  Ambulation/Gait Ambulation/Gait assistance: Supervision Ambulation Distance (Feet): 400 Feet Assistive device: Rolling walker (2 wheeled) Gait Pattern/deviations: Step-through pattern;Decreased stride length   Gait velocity interpretation: Below normal speed for age/gender General Gait Details: cues for position in RW  Stairs            Wheelchair Mobility    Modified Rankin (Stroke Patients Only)       Balance Overall balance assessment: Needs assistance   Sitting balance-Leahy Scale: Good       Standing balance-Leahy Scale: Poor                               Pertinent Vitals/Pain Pain Assessment: No/denies pain  HR 90-97 sats 93% on RA and dropped to 90% with gait    Home Living Family/patient expects to be discharged to:: Private residence Living Arrangements: Spouse/significant other;Other relatives Available Help at Discharge: Family;Available 24 hours/day Type of Home: House Home Access: Stairs to enter Entrance Stairs-Rails: None Entrance Stairs-Number of Steps: 1 Home Layout: One level Home Equipment: Walker - 2 wheels;Cane - single point      Prior Function Level of Independence: Independent with assistive device(s);Needs assistance   Gait / Transfers Assistance Needed: pt walks with a RW  ADL's / Homemaking Assistance Needed: wife does the homemaking, pt still drives and states wife helps at times with bathing and dressing        Hand Dominance        Extremity/Trunk Assessment   Upper Extremity Assessment: Generalized weakness           Lower Extremity Assessment: Generalized weakness (bil LE 3/5 all myotomes)      Cervical / Trunk Assessment: Normal  Communication   Communication: No difficulties  Cognition Arousal/Alertness: Awake/alert Behavior During Therapy: WFL for tasks assessed/performed Overall Cognitive Status: Impaired/Different from baseline Area of Impairment: Orientation Orientation Level: Time                  General Comments      Exercises General Exercises - Lower Extremity Long Arc Quad: AROM;Seated;Both;10  reps Hip Flexion/Marching: AROM;Seated;Both;10 reps      Assessment/Plan    PT Assessment Patient needs continued PT services  PT Diagnosis Difficulty walking;Generalized weakness;Altered mental status   PT Problem List Decreased strength;Decreased mobility;Decreased activity tolerance;Decreased balance;Decreased knowledge of use of DME  PT Treatment Interventions Gait training;DME instruction;Functional mobility training;Therapeutic  activities;Therapeutic exercise;Balance training;Patient/family education   PT Goals (Current goals can be found in the Care Plan section) Acute Rehab PT Goals Patient Stated Goal: return home with wife PT Goal Formulation: With patient Time For Goal Achievement: 07/12/15 Potential to Achieve Goals: Good    Frequency Min 3X/week   Barriers to discharge        Co-evaluation               End of Session Equipment Utilized During Treatment: Gait belt Activity Tolerance: Patient tolerated treatment well Patient left: in chair;with call bell/phone within reach;with chair alarm set Nurse Communication: Mobility status         Time: 0350-0938 PT Time Calculation (min) (ACUTE ONLY): 22 min   Charges:   PT Evaluation $Initial PT Evaluation Tier I: 1 Procedure     PT G CodesMelford Aase 06/28/2015, 11:23 AM Elwyn Reach, Rosalie

## 2015-06-28 NOTE — Progress Notes (Signed)
[name] Date of Encounter: 06/28/2015  Active Problems:   Diastolic CHF, acute on chronic   Dyspnea   Pt. Profile: 74 year old male with past medical history of end-stage renal disease, hypertension, hyperlipidemia, chronic diastolic congestive heart failure, metastatic prostate cancer for evaluation of acute on chronic diastolic congestive heart failure.  SUBJECTIVE  Feeling well. No chest pain, sob or palpitations. HD today and possible discharge today afterwards, pending volume status. Palliative seen yesterday, plan for symptoms management. No documentation regarding anticoagulations. Per primary note, he will need a bone biopsy for tissue diagnosis in the near future.  CURRENT MEDS . aspirin EC  81 mg Oral Daily  . bicalutamide  50 mg Oral Daily  . clopidogrel  75 mg Oral Daily  . darbepoetin (ARANESP) injection - DIALYSIS  60 mcg Intravenous Q Tue-HD  . doxercalciferol  7 mcg Intravenous Q T,Th,Sa-HD  . feeding supplement (NEPRO CARB STEADY)  237 mL Oral BID BM  . gabapentin  100 mg Oral BID  . heparin  5,000 Units Subcutaneous 3 times per day  . lanthanum  1,000 mg Oral TID WC  . metoprolol tartrate  50 mg Oral BID  . multivitamin  1 tablet Oral QHS  . pantoprazole  40 mg Oral Daily  . polyethylene glycol  17 g Oral Daily  . senna-docusate  1 tablet Oral QHS  . simvastatin  40 mg Oral QPM  . sodium chloride  3 mL Intravenous Q12H    OBJECTIVE  Filed Vitals:   06/27/15 2319 06/28/15 0434 06/28/15 1029 06/28/15 1059  BP: 138/68 125/86  107/65  Pulse: 74 77  75  Temp: 98.1 F (36.7 C) 98 F (36.7 C)    TempSrc: Oral Oral    Resp: 18 18    Height:      Weight:  159 lb 9.8 oz (72.4 kg)    SpO2: 94% 100% 93%     Intake/Output Summary (Last 24 hours) at 06/28/15 1113 Last data filed at 06/28/15 0911  Gross per 24 hour  Intake   1060 ml  Output      0 ml  Net   1060 ml   Filed Weights   06/27/15 0717 06/27/15 1018 06/28/15 0434    Weight: 167 lb 8.8 oz (76 kg) 159 lb 2.8 oz (72.2 kg) 159 lb 9.8 oz (72.4 kg)    PHYSICAL EXAM  General: Pleasant, NAD. Neuro: Alert and oriented X 3. Moves all extremities spontaneously. Psych: Normal affect. HEENT:  Normal  Neck: Supple without bruits or JVD elevated to mandible.  Lungs:  Resp regular and unlabored. Bibasilar crackles.  Heart: RRR no s3, s4, or murmurs. Abdomen: Soft, non-tender, non-distended, BS + x 4.  Extremities: No clubbing, cyanosis. + LE  edema. DP 1+ and equal bilaterally.LFA AVF + thrill/+ Bruit  Accessory Clinical Findings  CBC  Recent Labs  06/26/15 0349 06/27/15 0315 06/28/15 0343  WBC 6.9 5.3 4.3  NEUTROABS 4.5  --   --   HGB 8.8* 8.9* 9.1*  HCT 28.7* 28.9* 29.6*  MCV 96.6 97.0 97.4  PLT 81* 82* 86*   Basic Metabolic Panel  Recent Labs  06/26/15 0349 06/27/15 0315 06/28/15 0343  NA 143 138 139  K 5.6* 4.4 4.0  CL 99* 97* 99*  CO2 26 28 27   GLUCOSE 108* 95 93  BUN 56* 30* 26*  CREATININE 7.51* 4.71* 4.36*  CALCIUM 8.3* 8.3* 8.3*  MG 2.1  --   --  Cardiac Enzymes  Recent Labs  06/26/15 0349 06/26/15 0922  TROPONINI 0.04* 0.04*    TELE  Sinus rhythm   Radiology/Studies   ASSESSMENT AND PLAN  1 acute on chronic diastolic congestive heart failure -Had HD 06/27/15  (4L) as well as 06/26/15 (3L). HD again today.  - Continue BB - Last ECHO 01/03/15: EF 55%, moderate LVH, Grade II diastolic dysfunction. Pending repeat echo.   2 Chest tightness - Now resolved. Trop 0.04->0.04.   3 end-stage renal disease -this appears to be the predominant issue related to volume excess. Nephrology consult pending. Will need lower dry weight.  4 metastatic prostate cancer  5 hypertension -continue present blood pressure medications. If low blood pressure becomes a problem related to decreasing his dry weight would discontinue Norvasc.  6. PAF - Intermittent Afib. Currently in sinus rhythm. - CHADSVASC score of at least 4  (age, CHF, HTN and vascular disease).  - Continue BB. Will discuss with MD regarding anticoagulation.   7. 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 He also has a right common iliac aneurysm. Follows with Dr. Bridgett Larsson. - Continue Plavix   Signed, Bhagat,Bhavinkumar PA-C Pager [phone]    I have personally seen and examined this patient with B. Bhagat, PA-C. I agree with the assessment and plan as outlined above. The patient is feeling better today. Volume removal by HD. Breathing much better. He has persistent atrial fib for at least several months. He has been on ASA and Plavix but anti-coagulation has not bee discussed. CHADS VASC 4. He understands the risk of CVA but given comorbidities including chronic anemia and metastatic cancer, he is high risk for anti-coagulation, though not prohibitive. I have discussed this with him and he does not wish to start anti-coagulation.   MCALHANY,CHRISTOPHER 06/28/2015 12:08 PM

## 2015-06-28 NOTE — Progress Notes (Signed)
  Echocardiogram 2D Echocardiogram has been performed.  [name] 06/28/2015, 5:13 PM

## 2015-06-28 NOTE — Progress Notes (Signed)
Utilization review completed. Bertha Stanfill, RN, BSN. 

## 2015-06-28 NOTE — Progress Notes (Signed)
  [facility] Progress Note   Subjective: another 3kg off w HD yesterday  Filed Vitals:   06/28/15 0434 06/28/15 1029 06/28/15 1059 06/28/15 1116  BP: 125/86  107/65   Pulse: 77  75 90  Temp: 98 F (36.7 C)     TempSrc: Oral     Resp: 18     Height:      Weight: 72.4 kg (159 lb 9.8 oz)     SpO2: 100% 93%  93%   Exam: General: alert , no distress Lungs: clear bilat Heart: RRR with S1 S2.No M/G/R.  Abdomen: Soft, non-tender, non-distended with normoactive bowel sounds Ext: trace edema  Neuro: Alert and oriented X 3. Moves all extremities spontaneously Dialysis Access: LFA AVF + thrill/+ Bruit  Dialysis: [facility] TTS 4h 73.5kg 3/ 2.25 bath LFA AVF  Doxercalciferol 7 mcg IV TTS (last dose 06/23/2015) Mircera 75 mcg IVP Every 2 weeks (last dose 06/12/2015)   Assessment: 1.  Acute pulm edema/ vol overload - resolved 2.  Chest Pain: minimally ^trop  3.  ESRD - [facility] 4. HTN - controlled on mtp 50 bid 5. Anemia - 8.8. Will start ESA.  6. Metabolic bone disease - Ca 8.3. Continue OP binders 7. Metastatic prostate cancer: Pain management per primary 8. Afib - seen by cardiology, rate controlled. New afib since July ' 16 9. Dispo per primary team, stable for dc from renal standpoint  Plan - short HD today to get back on schedule    Kelly Splinter MD  pager [phone]    cell [phone]  06/28/2015, 11:56 AM     Recent Labs Lab 06/26/15 0349 06/27/15 0315 06/28/15 0343  NA 143 138 139  K 5.6* 4.4 4.0  CL 99* 97* 99*  CO2 26 28 27   GLUCOSE 108* 95 93  BUN 56* 30* 26*  CREATININE 7.51* 4.71* 4.36*  CALCIUM 8.3* 8.3* 8.3*   No results for input(s): AST, ALT, ALKPHOS, BILITOT, PROT, ALBUMIN in the last 168 hours.  Recent Labs Lab 06/26/15 0349 06/27/15 0315 06/28/15 0343  WBC 6.9 5.3 4.3  NEUTROABS 4.5  --   --   HGB 8.8* 8.9* 9.1*  HCT 28.7* 28.9* 29.6*  MCV 96.6 97.0 97.4  PLT 81* 82* 86*   . aspirin EC  81 mg Oral  Daily  . bicalutamide  50 mg Oral Daily  . clopidogrel  75 mg Oral Daily  . darbepoetin (ARANESP) injection - DIALYSIS  60 mcg Intravenous Q Tue-HD  . doxercalciferol  7 mcg Intravenous Q T,Th,Sa-HD  . feeding supplement (NEPRO CARB STEADY)  237 mL Oral BID BM  . gabapentin  100 mg Oral BID  . heparin  5,000 Units Subcutaneous 3 times per day  . lanthanum  1,000 mg Oral TID WC  . metoprolol tartrate  50 mg Oral BID  . multivitamin  1 tablet Oral QHS  . pantoprazole  40 mg Oral Daily  . polyethylene glycol  17 g Oral Daily  . senna-docusate  1 tablet Oral QHS  . simvastatin  40 mg Oral QPM  . sodium chloride  3 mL Intravenous Q12H     sodium chloride, acetaminophen, HYDROcodone-acetaminophen, ondansetron (ZOFRAN) IV, sodium chloride

## 2015-06-28 NOTE — Discharge Instructions (Signed)
You were admitted to the Family Medicine Inpatient Service at [place] were treated for fluid in your lungs and legs due to acute exacerbation of congestive heart failure and renal disease. You were also evaluated for your chest tightness, and our tests indicated that you did not have a heart attack and that the chest tightness was most likely due to the fluid in your lungs, which we successfully treated for the time being. You will need to continue taking your prescribed medication and going to your regularly scheduled dialysis sessions for further treatment of these conditions. Please follow up with your cardiologist and primary care physician regarding the results of your echocardiogram, which was pending at the time of your discharge.  Your electrocardiograms (EKGs) at this and prior hospitalizations also indicated that you have atrial fibrillation (afib). We have increased your daily metoprolol dose and recommend that you take aspirin and stop plavix for this condition.  We spoke with the oncology team who would like to emphasize the importance of getting the biopsy to evaluate your cancer. The biopsy will help your oncologist help you make decisions regarding further treatment.  If you experience worsening shortness of breath or chest pain, please seek immediate medical attention at your nearest emergency room. If you experience worsening leg swelling, please notify your provider at dialysis. Please follow up with your primary care physician for further coordination of your medical care.  Heart Failure Heart failure means your heart has trouble pumping blood. This makes it hard for your body to work well. Heart failure is usually a long-term (chronic) condition. You must take good care of yourself and follow your doctor's treatment plan. HOME CARE  Take your heart medicine as told by your doctor.  Do not stop taking medicine unless your doctor tells you to.  Do not skip any dose  of medicine.  Refill your medicines before they run out.  Take other medicines only as told by your doctor or pharmacist.  Stay active if told by your doctor. The elderly and people with severe heart failure should talk with a doctor about physical activity.  Eat heart-healthy foods. Choose foods that are without trans fat and are low in saturated fat, cholesterol, and salt (sodium). This includes fresh or frozen fruits and vegetables, fish, lean meats, fat-free or low-fat dairy foods, whole grains, and high-fiber foods. Lentils and dried peas and beans (legumes) are also good choices.  Limit salt if told by your doctor.  Cook in a healthy way. Roast, grill, broil, bake, poach, steam, or stir-fry foods.  Limit fluids as told by your doctor.  Weigh yourself every morning. Do this after you pee (urinate) and before you eat breakfast. Write down your weight to give to your doctor.  Take your blood pressure and write it down if your doctor tells you to.  Ask your doctor how to check your pulse. Check your pulse as told.  Lose weight if told by your doctor.  Stop smoking or chewing tobacco. Do not use gum or patches that help you quit without your doctor's approval.  Schedule and go to doctor visits as told.  Nonpregnant women should have no more than 1 drink a day. Men should have no more than 2 drinks a day. Talk to your doctor about drinking alcohol.  Stop illegal drug use.  Stay current with shots (immunizations).  Manage your health conditions as told by your doctor.  Learn to manage your stress.  Rest when you are  tired.  If it is really hot outside:  Avoid intense activities.  Use air conditioning or fans, or get in a cooler place.  Avoid caffeine and alcohol.  Wear loose-fitting, lightweight, and light-colored clothing.  If it is really cold outside:  Avoid intense activities.  Layer your clothing.  Wear mittens or gloves, a hat, and a scarf when going  outside.  Avoid alcohol.  Learn about heart failure and get support as needed.  Get help to maintain or improve your quality of life and your ability to care for yourself as needed. GET HELP IF:   You gain 03 lb/1.4 kg or more in 1 day or 05 lb/2.3 kg in a week.  You are more short of breath than usual.  You cannot do your normal activities.  You tire easily.  You cough more than normal, especially with activity.  You have any or more puffiness (swelling) in areas such as your hands, feet, ankles, or belly (abdomen).  You cannot sleep because it is hard to breathe.  You feel like your heart is beating fast (palpitations).  You get dizzy or light-headed when you stand up. GET HELP RIGHT AWAY IF:   You have trouble breathing.  There is a change in mental status, such as becoming less alert or not being able to focus.  You have chest pain or discomfort.  You faint. MAKE SURE YOU:   Understand these instructions.  Will watch your condition.  Will get help right away if you are not doing well or get worse. Document Released: 07/01/2008 Document Revised: 02/06/2014 Document Reviewed: 11/08/2012 [facility] Patient Information 2015 [place], [place]. This information is not intended to replace advice given to you by your health care provider. Make sure you discuss any questions you have with your health care provider.

## 2015-06-28 NOTE — Progress Notes (Signed)
Pt has orders to be discharged. Discharge instructions given and pt has no additional questions at this time. Medication regimen reviewed and pt educated. Pt verbalized understanding and has no additional questions. Telemetry box removed. IV removed and site in good condition. Pt stable and waiting for transportation.   Bethany Scherer RN 

## 2015-06-28 NOTE — Care Management Note (Signed)
Case Management Note  Patient Details  Name: Tim Kirby MRN: [id number] Date of Birth: May 29, 1941  Subjective/Objective:       Admitted with CHF             Action/Plan: Patient recently admitted- [place] arranged with [facility]- spoke to [place] - orders  Faxed as requested ( [phone]) Per notes patient has rolling walker at home. CM unable to talk to patient at this time - in Hemodialysis at present. 3:1 ordered.  Expected Discharge Date:     Possibly 06/30/2015             Expected Discharge Plan:  [place]  Discharge planning Services  CM Consult  HH Arranged:   HHPT [facility] Agency:  [facility]  Status of Service:  In process, will continue to follow  Sherrilyn Rist [phone] 06/28/2015, 3:35 PM

## 2015-06-28 NOTE — Consult Note (Signed)
   [facility] CM Inpatient Consult   06/28/2015  Tim Kirby Sep 14, 1941 [id number] Referral received to assess for care management services. Explained that [facility] is a covered benefit of insurance.   Met with the patient regarding the benefits of [facility] services. Review information for [facility] Care Management and a brochure was provided with contact information and left for his wife.  Explained that [facility] does not interfere with or replace any services arranged by the inpatient care management staff.  Patient states, "my wife takes care of this kind of stuff and she is teaching school today.  I am not sure when she will get here this evening."  For questions, please contact: Natividad Brood, RN BSN [facility] Liaison  [phone] business mobile phone

## 2015-06-28 NOTE — Progress Notes (Signed)
Family Medicine Teaching Service Daily Progress Note Intern Pager: [phone]  Patient name: STEPHENS SHREVE Medical record number: [id number] Date of birth: [birth year] Age: 74 y.o. Gender: male  Primary Care Provider: [name], MD Consultants: Nephrology, Cardiology, Palliative Care Code Status: FULL  Pt Overview and Major Events to Date:  None  Assessment and Plan: FARMER MCCAHILL is a 75 y.o. male presenting as transfer from [facility] with worsening dyspnea and edema, consistent with acute on chronic dCHF exacerbation in setting of ESRD, accepted transfer due to req HD. PMH is significant for recently diagnosed metastatic prostate cancer, HTN, ESRD on HD, PVD, HLD, CHF, atrial fibrillation, anemia, and gout.   Acute on chronic CHF (Diastolic) Exacerbation: Last ECHO 01/03/15: EF 55%, moderate LVH, Grade II diastolic dysfunction. Last stress test myocardial perfusion scan 10/2012 was negative for ischemia. Appeared fluid overloaded on exam and BP is elevated at presentation. CXR showing persistent CHF with bibasilar effusions. Requiring 2L-->1L-->0.5 O2 via [place]. Weight was 165/160 at last hospitalization and was 172 this admission. Weight is 167 this morning.  - Nephrology following, appreciate recommendations. Received extra HD yesterday (in addition to regular TThSat schedule), took off ~3L of fluid. Receiving dialysis again today as scheduled (touch base with nephro as note from 9/21 seems to suggest outpatient) - Cardiology following, appreciate recommendations. Recommend if hypotension, should discontinue Norvasc.  - Will f/u repeat ECHO - Supplemental O2 prn for sats <92%, currently weaning, nurse to d/c today  - Continuous pulse ox - Increased home metoprolol from 25 mg BID --> 50mg  BID - daily weights; strict I&Os - Incentive spirometry  Chest Tightness, Rule out MI: Per patient's description sounds related to his SOB, possibly with bone pain from multiple bone  mets including sternum and ribs. However, due to his multiple risk factors ACS r/o copleted.  - Troponin stable at 0.04 x 2 - EKG showing NSR, non-specific ST abnormalities, repeat EKG with atrial flutter - Will continue to monitor for chest pain  Afib: CHADSVASC is a 3. In Afib with HRs in the 80s-90s this morning, but HRs up to 120 overnight. EKG from previous admission showing Afib. Was in A flutter during that admission and then went into Afib. EKG this admission showed NSR. - Metoprolol 25mg  BID --> 50mg  bid yesterday. CTM BPs. - Clopidogrel + ASA for PAD below, pt does not take ASA at home. - Will speak with Cardiology and patient today about starting anticoagulation, given comorbid metastatic malignancy.  ESRD on HD: T/TH/Sat dialysis. Has not missed any HD outpatients. K was 6.1 at [facility] and patient was given Kayexelate x 1. K improved to 5.6-->4.4-->4.0. - Receiving HD as scheduled today. - Nephrology following, appreciate recommendations. (see above) - Will monitor with daily BMETs  HTN: BP ranging from 137-149/80-90 overnight.  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 Reports he has been taking and that he is getting established with same Oncologist who saw him at [facility]. Has been taking Vicodin prn for skeletal pain; notes that it is improved since discharge.  - Pain control: Gabapentin 100 mg BID, Vicodin 1-2 tablets q6 hours PRN - Palliative care consult initiated discussion of goals of care 9/21, appreciate recommendations - Continue bicalutamide 50mg  daily, (anti-androgen therapy) - Holding on Oncology consult for now - Will attempt to contact OP Oncologist again today  Constipation: Reports chronic constipation relieved  with OTC medications. Likely worsened with narcotic use. Had 2 BMs yesterday. - Senakot-S daily at bedtime -  Miralax daily   HLD: - Continue home Zocor 40 mg   Anemia: Patient has h/o of anemia. Given ESRD, most likely has anemia of chronic disease. Bicalutamide may also exacerbate anemia. Baseline appears to be 9-10. Hgb slowly trending upward 8.8-->8.9-->9.0. - Monitor with daily CBCs  PAD: Patient is s/p right femoral to below knee popliteal bypass graft with popliteal aneurysm ligation on 10/18/2012 and left popliteal artery stent for aneurysm on 12/29/2012. He also has a right common iliac aneurysm. Follows with Dr. Bridgett Larsson. - Continue Plavix  Goals of Care: Palliative Care initiated discussion yesterday. - Patient desires DNR/DNI but deferring to family - Wife states she is HCPOA, no paperwork on file. - Will f/u with wife today.  FEN/GI: Renal diet with fluid restriction; Saline Lock IV PPx: Heparin  Disposition: Home likely late today or early tomorrow. Awaiting echo and weaning O2 to room air.  Subjective:  Doing well this morning, states breathing is better than yesterday. Some shoulder pain last evening, took Vicodin x 1 with relief. No back pain. No concerns.  Objective: Temp:  [97.6 F (36.4 C)-98.2 F (36.8 C)] 98 F (36.7 C) (09/22 0434) Pulse Rate:  [74-118] 77 (09/22 0434) Resp:  [18-20] 18 (09/22 0434) BP: (110-138)/(68-89) 125/86 mmHg (09/22 0434) SpO2:  [94 %-100 %] 100 % (09/22 0434) Weight:  [159 lb 2.8 oz (72.2 kg)-159 lb 9.8 oz (72.4 kg)] 159 lb 9.8 oz (72.4 kg) (09/22 0434) Physical Exam: General: Elderly man lying in bed in NAD HEENT: [place]/AT, EOMI, MMM Neck: Full ROM. Cardiovascular: Irregularly irregular rhythm, normal rate. No m/r/g. Faint distal pulses.  Respiratory: [place] in place. Breathing comfortably. No crackles. Abdomen: +BS, soft, NTND MSK: 2+ edema up to ankles only, improved from yesterday. Skin: Warm and dry Neuro: Alert and oriented. No gross deficits noted.  Psych: Appropriate mood and affect.   Laboratory:  Recent Labs Lab 06/26/15 0349  06/27/15 0315 06/28/15 0343  WBC 6.9 5.3 4.3  HGB 8.8* 8.9* 9.1*  HCT 28.7* 28.9* 29.6*  PLT 81* 82* 86*    Recent Labs Lab 06/26/15 0349 06/27/15 0315 06/28/15 0343  NA 143 138 139  K 5.6* 4.4 4.0  CL 99* 97* 99*  CO2 26 28 27   BUN 56* 30* 26*  CREATININE 7.51* 4.71* 4.36*  CALCIUM 8.3* 8.3* 8.3*  GLUCOSE 108* 95 93    Imaging/Diagnostic Tests: Awaiting Echo.  Noreene Larsson, MS4 06/28/2015, 9:20 AM PGY-1, [place] Intern pager: [phone], text pages welcome  RESIDENT ADDENDUM  I have separately seen and examined the patient. I have discussed the findings and exam with the medical student and agree with the above note, which I have edited appropriately. I helped develop the management plan that is described in the student's note, and I agree with the content.  Additionally I have outlined my exam and assessment/plan below:   Patient breathing comfortably  PE:  Blood pressure 125/86, pulse 77, temperature 98 F (36.7 C), temperature source Oral, resp. rate 18, height 5\' 8"  (1.727 m), weight 159 lb 9.8 oz (72.4 kg), SpO2 100 %. Gen: lying in bed comfortably  CV: Irregularly irregular rhythm, normal rate. No m/r/g noted. 1+ DP pulses. 2+ pitting edema up to above the ankles more prominent on the L to the R.  Lungs CTAB without wheezing, rhonchi, or crackles  A/P:  Acute on chronic CHF (diastolic):  Pitting edema is improved and lung exam without crackles. Back to dry weight around 159lbs after extra round of HD yesterday with 3L off. Satting 100% on 1L. Breathing comfortably on 0.65mL currently. Wean O2. Touch base with nephro and pt normally gets HD on TTS. Echo today. Will touch base with onc to see the possibility of having recurrence with anti-androgen.  Afib: rate controlled. Still not on anticoagulation. Will continue to address as he is at a high risk for clotting given malignancy.    Archie Patten, MD PGY-2,  [facility] 06/28/2015  10:03 AM

## 2015-06-28 NOTE — Progress Notes (Signed)
SATURATION QUALIFICATIONS: (This note is used to comply with regulatory documentation for home oxygen)  Patient Saturations on Room Air at Rest = 93%  Patient Saturations on Room Air while Ambulating = 90%   

## 2015-07-10 ENCOUNTER — Inpatient Hospital Stay (HOSPITAL_COMMUNITY)
Admission: AD | Admit: 2015-07-10 | Discharge: 2015-07-14 | DRG: 640 | Disposition: A | Discharge status: Home / Self Care | Payer: Medicare Other | Source: Other Acute Inpatient Hospital | Attending: Family Medicine | Admitting: Family Medicine

## 2015-07-10 DIAGNOSIS — Z87891 Personal history of nicotine dependence: Secondary | ICD-10-CM | POA: Diagnosis not present

## 2015-07-10 DIAGNOSIS — Z79899 Other long term (current) drug therapy: Secondary | ICD-10-CM | POA: Diagnosis not present

## 2015-07-10 DIAGNOSIS — I5032 Chronic diastolic (congestive) heart failure: Secondary | ICD-10-CM | POA: Diagnosis present

## 2015-07-10 DIAGNOSIS — D649 Anemia, unspecified: Secondary | ICD-10-CM | POA: Diagnosis present

## 2015-07-10 DIAGNOSIS — I482 Chronic atrial fibrillation: Secondary | ICD-10-CM | POA: Diagnosis present

## 2015-07-10 DIAGNOSIS — R290 Tetany: Secondary | ICD-10-CM | POA: Diagnosis present

## 2015-07-10 DIAGNOSIS — Z8249 Family history of ischemic heart disease and other diseases of the circulatory system: Secondary | ICD-10-CM | POA: Diagnosis not present

## 2015-07-10 DIAGNOSIS — N186 End stage renal disease: Secondary | ICD-10-CM | POA: Diagnosis present

## 2015-07-10 DIAGNOSIS — N2581 Secondary hyperparathyroidism of renal origin: Secondary | ICD-10-CM | POA: Diagnosis present

## 2015-07-10 DIAGNOSIS — I10 Essential (primary) hypertension: Secondary | ICD-10-CM | POA: Diagnosis not present

## 2015-07-10 DIAGNOSIS — I4891 Unspecified atrial fibrillation: Secondary | ICD-10-CM | POA: Diagnosis present

## 2015-07-10 DIAGNOSIS — Z515 Encounter for palliative care: Secondary | ICD-10-CM | POA: Diagnosis not present

## 2015-07-10 DIAGNOSIS — Z992 Dependence on renal dialysis: Secondary | ICD-10-CM | POA: Diagnosis not present

## 2015-07-10 DIAGNOSIS — E785 Hyperlipidemia, unspecified: Secondary | ICD-10-CM | POA: Diagnosis present

## 2015-07-10 DIAGNOSIS — C7951 Secondary malignant neoplasm of bone: Secondary | ICD-10-CM | POA: Diagnosis present

## 2015-07-10 DIAGNOSIS — Z66 Do not resuscitate: Secondary | ICD-10-CM | POA: Diagnosis present

## 2015-07-10 DIAGNOSIS — R531 Weakness: Secondary | ICD-10-CM | POA: Diagnosis present

## 2015-07-10 DIAGNOSIS — Z7982 Long term (current) use of aspirin: Secondary | ICD-10-CM | POA: Diagnosis not present

## 2015-07-10 DIAGNOSIS — M109 Gout, unspecified: Secondary | ICD-10-CM | POA: Diagnosis present

## 2015-07-10 DIAGNOSIS — I132 Hypertensive heart and chronic kidney disease with heart failure and with stage 5 chronic kidney disease, or end stage renal disease: Secondary | ICD-10-CM | POA: Diagnosis present

## 2015-07-10 DIAGNOSIS — I2510 Atherosclerotic heart disease of native coronary artery without angina pectoris: Secondary | ICD-10-CM | POA: Diagnosis present

## 2015-07-10 DIAGNOSIS — C61 Malignant neoplasm of prostate: Secondary | ICD-10-CM | POA: Diagnosis present

## 2015-07-10 DIAGNOSIS — K909 Intestinal malabsorption, unspecified: Secondary | ICD-10-CM | POA: Diagnosis present

## 2015-07-10 DIAGNOSIS — T50995A Adverse effect of other drugs, medicaments and biological substances, initial encounter: Secondary | ICD-10-CM | POA: Diagnosis present

## 2015-07-10 LAB — COMPREHENSIVE METABOLIC PANEL
ALBUMIN: 2.8 g/dL — AB (ref 3.5–5.0)
ALT: 12 U/L — ABNORMAL LOW (ref 17–63)
AST: 28 U/L (ref 15–41)
Alkaline Phosphatase: 1960 U/L — ABNORMAL HIGH (ref 38–126)
Anion gap: 16 — ABNORMAL HIGH (ref 5–15)
BUN: 33 mg/dL — AB (ref 6–20)
CHLORIDE: 91 mmol/L — AB (ref 101–111)
CO2: 26 mmol/L (ref 22–32)
CREATININE: 6.51 mg/dL — AB (ref 0.61–1.24)
Calcium: 5.5 mg/dL — CL (ref 8.9–10.3)
GFR calc Af Amer: 9 mL/min — ABNORMAL LOW (ref >60)
GFR calc non Af Amer: 7 mL/min — ABNORMAL LOW (ref >60)
Glucose, Bld: 119 mg/dL — ABNORMAL HIGH (ref 65–99)
Potassium: 4.9 mmol/L (ref 3.5–5.1)
SODIUM: 133 mmol/L — AB (ref 135–145)
Total Bilirubin: 0.6 mg/dL (ref 0.3–1.2)
Total Protein: 5.2 g/dL — ABNORMAL LOW (ref 6.5–8.1)

## 2015-07-10 LAB — PHOSPHORUS: Phosphorus: 4.2 mg/dL (ref 2.5–4.6)

## 2015-07-10 LAB — MRSA PCR SCREENING: MRSA BY PCR: NEGATIVE

## 2015-07-10 MED ORDER — ALTEPLASE 2 MG IJ SOLR: 2.0000 mg | Freq: Once | INTRAMUSCULAR | Status: DC | PRN

## 2015-07-10 MED ORDER — CALCIUM GLUCONATE 10 % IV SOLN
1.0000 g | Freq: Once | INTRAVENOUS | Status: AC
  Administered 2015-07-10: 1 g via INTRAVENOUS
  Filled 2015-07-10: qty 10

## 2015-07-10 MED ORDER — PENTAFLUOROPROP-TETRAFLUOROETH EX AERO: 1.0000 "application " | INHALATION_SPRAY | CUTANEOUS | Status: DC | PRN

## 2015-07-10 MED ORDER — CALCIUM GLUCONATE 10 % IV SOLN
INTRAVENOUS | Status: DC
Start: 2015-07-10 — End: 2015-07-10
  Filled 2015-07-10: qty 10

## 2015-07-10 MED ORDER — SODIUM CHLORIDE 0.9 % IV SOLN: 100.0000 mL | INTRAVENOUS | Status: DC | PRN

## 2015-07-10 MED ORDER — SODIUM CHLORIDE 0.9 % IV SOLN
100.0000 mL | INTRAVENOUS | Status: DC | PRN
Start: 2015-07-10 — End: 2015-07-10

## 2015-07-10 MED ORDER — CALCITRIOL 1 MCG/ML IV SOLN
2.0000 ug | Freq: Once | INTRAVENOUS | Status: AC
  Administered 2015-07-10: 2 ug via INTRAVENOUS
  Filled 2015-07-10: qty 2

## 2015-07-10 MED ORDER — SODIUM CHLORIDE 0.9 % IV SOLN
2.0000 g | Freq: Once | INTRAVENOUS | Status: AC
  Administered 2015-07-10: 2 g via INTRAVENOUS
  Filled 2015-07-10: qty 20

## 2015-07-10 MED ORDER — BICALUTAMIDE 50 MG PO TABS
50.0000 mg | ORAL_TABLET | Freq: Every day | ORAL | Status: DC
  Administered 2015-07-10 – 2015-07-13 (×4): 50 mg via ORAL
  Filled 2015-07-10 (×5): qty 1

## 2015-07-10 MED ORDER — HYDROCODONE-ACETAMINOPHEN 5-325 MG PO TABS
1.0000 | ORAL_TABLET | Freq: Four times a day (QID) | ORAL | Status: DC | PRN
  Administered 2015-07-11 – 2015-07-14 (×3): 2 via ORAL
  Filled 2015-07-10: qty 1
  Filled 2015-07-10 (×3): qty 2

## 2015-07-10 MED ORDER — LANTHANUM CARBONATE 500 MG PO CHEW
1000.0000 mg | CHEWABLE_TABLET | Freq: Two times a day (BID) | ORAL | Status: DC
  Administered 2015-07-11 – 2015-07-12 (×4): 1000 mg via ORAL
  Filled 2015-07-10 (×5): qty 2

## 2015-07-10 MED ORDER — HEPARIN SODIUM (PORCINE) 1000 UNIT/ML DIALYSIS: 1000.0000 [IU] | INTRAMUSCULAR | Status: DC | PRN

## 2015-07-10 MED ORDER — SIMVASTATIN 40 MG PO TABS
40.0000 mg | ORAL_TABLET | Freq: Every evening | ORAL | Status: DC
  Administered 2015-07-10 – 2015-07-14 (×5): 40 mg via ORAL
  Filled 2015-07-10 (×5): qty 1

## 2015-07-10 MED ORDER — HEPARIN SODIUM (PORCINE) 1000 UNIT/ML DIALYSIS: 5000.0000 [IU] | Freq: Once | INTRAMUSCULAR | Status: AC

## 2015-07-10 MED ORDER — RENA-VITE PO TABS
1.0000 | ORAL_TABLET | Freq: Every day | ORAL | Status: DC
  Administered 2015-07-10 – 2015-07-13 (×4): 1 via ORAL
  Filled 2015-07-10 (×4): qty 1

## 2015-07-10 MED ORDER — ALTEPLASE 2 MG IJ SOLR
2.0000 mg | Freq: Once | INTRAMUSCULAR | Status: DC | PRN
  Filled 2015-07-10: qty 2

## 2015-07-10 MED ORDER — LIDOCAINE HCL (PF) 1 % IJ SOLN: 5.0000 mL | INTRAMUSCULAR | Status: DC | PRN

## 2015-07-10 MED ORDER — LIDOCAINE-PRILOCAINE 2.5-2.5 % EX CREA: 1.0000 "application " | TOPICAL_CREAM | CUTANEOUS | Status: DC | PRN

## 2015-07-10 MED ORDER — CALCIUM CARBONATE ANTACID 500 MG PO CHEW
1000.0000 mg | CHEWABLE_TABLET | Freq: Four times a day (QID) | ORAL | Status: DC
  Administered 2015-07-10 – 2015-07-11 (×2): 1000 mg via ORAL
  Filled 2015-07-10 (×2): qty 5

## 2015-07-10 MED ORDER — HEPARIN SODIUM (PORCINE) 5000 UNIT/ML IJ SOLN
5000.0000 [IU] | Freq: Three times a day (TID) | INTRAMUSCULAR | Status: DC
  Administered 2015-07-10 – 2015-07-14 (×9): 5000 [IU] via SUBCUTANEOUS
  Filled 2015-07-10 (×9): qty 1

## 2015-07-10 MED ORDER — ASPIRIN EC 81 MG PO TBEC
81.0000 mg | DELAYED_RELEASE_TABLET | Freq: Every day | ORAL | Status: DC
  Administered 2015-07-10 – 2015-07-13 (×4): 81 mg via ORAL
  Filled 2015-07-10 (×4): qty 1

## 2015-07-10 MED ORDER — PANTOPRAZOLE SODIUM 40 MG PO TBEC
80.0000 mg | DELAYED_RELEASE_TABLET | Freq: Every day | ORAL | Status: DC
Start: 2015-07-10 — End: 2015-07-14
  Administered 2015-07-10 – 2015-07-13 (×4): 80 mg via ORAL
  Filled 2015-07-10 (×5): qty 2

## 2015-07-10 MED ORDER — METOPROLOL TARTRATE 50 MG PO TABS
50.0000 mg | ORAL_TABLET | Freq: Two times a day (BID) | ORAL | Status: DC
  Administered 2015-07-11 – 2015-07-13 (×5): 50 mg via ORAL
  Filled 2015-07-10 (×6): qty 1

## 2015-07-10 MED ORDER — AMLODIPINE BESYLATE 5 MG PO TABS: 5.0000 mg | ORAL_TABLET | Freq: Every day | ORAL | Status: DC

## 2015-07-10 MED ORDER — GABAPENTIN 100 MG PO CAPS
100.0000 mg | ORAL_CAPSULE | Freq: Two times a day (BID) | ORAL | Status: DC
  Administered 2015-07-10 – 2015-07-13 (×7): 100 mg via ORAL
  Filled 2015-07-10 (×7): qty 1

## 2015-07-10 MED ORDER — HEPARIN SODIUM (PORCINE) 1000 UNIT/ML DIALYSIS: 100.0000 [IU]/kg | INTRAMUSCULAR | Status: DC | PRN

## 2015-07-10 MED ORDER — ALBUTEROL SULFATE (2.5 MG/3ML) 0.083% IN NEBU: 3.0000 mL | INHALATION_SOLUTION | RESPIRATORY_TRACT | Status: DC | PRN

## 2015-07-10 MED ORDER — HEPARIN SODIUM (PORCINE) 1000 UNIT/ML DIALYSIS
5000.0000 [IU] | Freq: Once | INTRAMUSCULAR | Status: DC
  Filled 2015-07-10: qty 5

## 2015-07-10 MED ORDER — LIDOCAINE-PRILOCAINE 2.5-2.5 % EX CREA
1.0000 "application " | TOPICAL_CREAM | CUTANEOUS | Status: DC | PRN
  Filled 2015-07-10: qty 5

## 2015-07-10 MED ORDER — NEPRO/CARBSTEADY PO LIQD: 237.0000 mL | ORAL | Status: DC | PRN

## 2015-07-10 NOTE — H&P (Addendum)
[facility] History and Physical  MAEJOR ERVEN JKD:[id number] DOB: [birth year] DOA: 07/10/2015  Referring physician: EDP PCP: [name], MD   Chief Complaint: Weakness, Twitching   HPI: Tim Kirby is a 74 y.o. male recently discharged from the hospital after a stay from 9/20 - 9/22 for acute on chronic diastolic CHF exacerbation.  Patient's significant medical history includes prostate cancer metastatic to bone, ESRD dialysis TTS.  He presents to the ED at [place] with severe muscle twitching and fatigue.  Symptoms onset this morning when he woke up and have been persistent throughout the day.  He has tried nothing for the symptoms prior to arrival at ED.  On arrival to ED he was found to be hypocalcemic with calcium of 5.7.  6 hours later after 2 gms calcium gluconate his calcium has actually gone down to 5.0!  He was given 2 more amps of calcium gluconate and transferred to [facility].  Regarding his metastatic prostate cancer, this is being treated with Lupron and bicalutamide.  There is also mention of consideration of starting him on Denosumab, patient thinks this was started but isnt sure.  He is being treated by Dr. Bobby Rumpf in [place].  Regarding his ESRD, he last had dialysis yesterday due to having fluid overload and missing dialysis on Sat.  Review of Systems: Systems reviewed.  As above, otherwise negative  Past Medical History  Diagnosis Date  . Hyperlipidemia   . Gout   . Secondary hyperparathyroidism   . Anemia   . Colon, diverticulosis   . Thyroid nodule   . Anginal pain     sees Dr. Lia Foyer  . Hypertension     sees Dr. Willy Eddy  . Aneurysm artery, popliteal   . Upper respiratory infection April 2016  . CHF (congestive heart failure)   . Aneurysm of right femoral artery   . ESRD (end stage renal disease) on dialysis     "TTS; [place]" (06/15/2015)  . Renal insufficiency    Past Surgical History  Procedure Laterality Date  . Knee surgery Left      cyst removal by Dr. Lorin Mercy  . Av fistula placement Left     left UA AVF  . Femoral-popliteal bypass graft  10/18/2012    Procedure: BYPASS GRAFT FEMORAL-POPLITEAL ARTERY;  Surgeon: Conrad Kenyon, MD;  Location: [facility];  Service: Vascular;  Laterality: Right;  Right Popliteal Aneurysm Exclusion; Ultrasound guided  . False aneurysm repair Left 12/29/2012    Procedure: REPAIR FALSE ANEURYSM;  Surgeon: Conrad Lockwood, MD;  Location: [place];  Service: Vascular;  Laterality: Left;  . Endovascular repair of popliteal artery aneurysm Left 12/29/2012    Procedure: ENDOVASCULAR REPAIR OF POPLITEAL AND FEMORAL ARTERY ANEURYSM;  Surgeon: Conrad Ceredo, MD;  Location: [place];  Service: Vascular;  Laterality: Left;  . Abdominal aortagram N/A 09/27/2012    Procedure: ABDOMINAL Maxcine Ham;  Surgeon: Angelia Mould, MD;  Location: [facility] CATH LAB;  Service: Cardiovascular;  Laterality: N/A;  . Tonsillectomy and adenoidectomy     Social History:  reports that he quit smoking about 7 years ago. His smoking use included Pipe, Cigars, and Cigarettes. He quit after 50 years of use. He has never used smokeless tobacco. He reports that he drinks alcohol. He reports that he does not use illicit drugs.  No Known Allergies  Family History  Problem Relation Age of Onset  . Hypertension Mother   . Diabetes Mother   . Diabetes Brother  Prior to Admission medications   Medication Sig Start Date End Date Taking? Authorizing Provider  Albuterol Sulfate 108 (90 BASE) MCG/ACT AEPB Inhale 2 puffs into the lungs every 4 (four) hours as needed (wheezing).     Historical Provider, MD  amLODipine (NORVASC) 5 MG tablet Take 5 mg by mouth daily.  02/01/15   Historical Provider, MD  aspirin EC 81 MG EC tablet Take 1 tablet (81 mg total) by mouth daily. 06/28/15   Patrecia Pour, MD  bicalutamide (CASODEX) 50 MG tablet Take 1 tablet (50 mg total) by mouth daily. 06/16/15   Milagros Loll, MD  gabapentin (NEURONTIN) 100 MG capsule  Take 1 capsule (100 mg total) by mouth 2 (two) times daily. 06/28/15   Patrecia Pour, MD  HYDROcodone-acetaminophen (NORCO/VICODIN) 5-325 MG per tablet Take 1-2 tablets by mouth every 6 (six) hours as needed for moderate pain or severe pain. 06/17/15   Milagros Loll, MD  lanthanum (FOSRENOL) 1000 MG chewable tablet Chew 1,000 mg by mouth 2 (two) times daily with a meal.    Historical Provider, MD  metoprolol (LOPRESSOR) 50 MG tablet Take 1 tablet (50 mg total) by mouth 2 (two) times daily. 06/28/15   Patrecia Pour, MD  multivitamin (RENA-VIT) TABS tablet Take 1 tablet by mouth daily.    Historical Provider, MD  omeprazole (PRILOSEC) 40 MG capsule Take 40 mg by mouth daily.    Historical Provider, MD  simvastatin (ZOCOR) 40 MG tablet Take 40 mg by mouth every evening.    Historical Provider, MD   Physical Exam: Filed Vitals:   07/10/15 2030  BP: 165/144  Pulse:   Temp:   Resp:     BP 165/144 mmHg  Pulse 77  Temp(Src) 98.4 F (36.9 C) (Oral)  Resp 21  Ht 5\' 8"  (1.727 m)  Wt 73.755 kg (162 lb 9.6 oz)  BMI 24.73 kg/m2  SpO2 100%  General Appearance:    Alert, oriented, no distress, appears stated age  Head:    Normocephalic, atraumatic  Eyes:    PERRL, EOMI, sclera non-icteric        Nose:   Nares without drainage or epistaxis. Mucosa, turbinates normal  Throat:   Moist mucous membranes. Oropharynx without erythema or exudate.  Neck:   Supple. No carotid bruits.  No thyromegaly.  No lymphadenopathy.   Back:     No CVA tenderness, no spinal tenderness  Lungs:     Clear to auscultation bilaterally, without wheezes, rhonchi or rales  Chest wall:    No tenderness to palpitation  Heart:    Regular rate and rhythm without murmurs, gallops, rubs  Abdomen:     Soft, non-tender, nondistended, normal bowel sounds, no organomegaly  Genitalia:    deferred  Rectal:    deferred  Extremities:   No clubbing, cyanosis or edema.  Pulses:   2+ and symmetric all extremities  Skin:   Skin color,  texture, turgor normal, no rashes or lesions  Lymph nodes:   Cervical, supraclavicular, and axillary nodes normal  Neurologic:   Patient with frank Tetany and Chvostek's sign.  Patient has facial muscle twitching without even needing to tap on the facial nerve but twitching is worse with percussion of the facial nerve.    Labs on Admission:  Basic Metabolic Panel: No results for input(s): NA, K, CL, CO2, GLUCOSE, BUN, CREATININE, CALCIUM, MG, PHOS in the last 168 hours. Liver Function Tests: No results for input(s): AST, ALT, ALKPHOS, BILITOT, PROT,  ALBUMIN in the last 168 hours. No results for input(s): LIPASE, AMYLASE in the last 168 hours. No results for input(s): AMMONIA in the last 168 hours. CBC: No results for input(s): WBC, NEUTROABS, HGB, HCT, MCV, PLT in the last 168 hours. Cardiac Enzymes: No results for input(s): CKTOTAL, CKMB, CKMBINDEX, TROPONINI in the last 168 hours.  BNP (last 3 results) No results for input(s): PROBNP in the last 8760 hours. CBG: No results for input(s): GLUCAP in the last 168 hours.  Radiological Exams on Admission: No results found.  EKG: Independently reviewed.  Assessment/Plan Principal Problem:   Hypocalcemia Active Problems:   Essential hypertension   End stage renal disease (HCC)   Prostate cancer metastatic to bone (HCC)   Chronic diastolic CHF (congestive heart failure) ([place])   1. Hypocalcemia -  1. 1 amps of calcium gluconate now (as clinically he is clearly having tetany and profound Chvostek's sign) 2. CMP ordered 3. I-cal Q2H ordered 4. Spoke with Dr. Lorrene Reid 1. Also giving patient 2 mcg calcitriol IV 2. 2nd amp of calcium gluconate 3. Elemental calcium 1gm QID 4. Q2H i-cal labs 2. HTN - holding his CCB 3. ESRD - Dr. Lorrene Reid is aware of patient as noted above, nephrology will do formal consult in AM 4. Chronic diastolic CHF - currently at baseline and stable  Also spoke with FM resident on call, FM currently plans on  taking over care of patient tomorrow as he is a bounce back.  Code Status: Full Code  Family Communication: No family in room Disposition Plan: Admit to inpatient   Time spent: 70 min  GARDNER, JARED M. [facility] Pager [phone]  If 7AM-7PM, please contact the day team taking care of the patient [email or web address] Password [email or web address] 07/10/2015, 9:35 PM

## 2015-07-10 NOTE — Progress Notes (Signed)
CRITICAL VALUE ALERT  Critical value received:  CA 5.5  Date of notification: 07/10/15  Time of notification:  2239  Critical value read back: yes  Nurse who received alert:  Gevena Barre, RN  MD notified (1st page): Dr Alcario Drought  Time of first page:2240  MD notified (2nd page):  Time of second page:  Responding MD: Dr Alcario Drought  Time MD responded:2241

## 2015-07-11 ENCOUNTER — Encounter (HOSPITAL_COMMUNITY): Payer: Self-pay | Admitting: *Deleted

## 2015-07-11 DIAGNOSIS — I10 Essential (primary) hypertension: Secondary | ICD-10-CM

## 2015-07-11 DIAGNOSIS — N186 End stage renal disease: Secondary | ICD-10-CM

## 2015-07-11 DIAGNOSIS — C61 Malignant neoplasm of prostate: Secondary | ICD-10-CM

## 2015-07-11 DIAGNOSIS — C7951 Secondary malignant neoplasm of bone: Secondary | ICD-10-CM

## 2015-07-11 LAB — CALCIUM
CALCIUM: 5.6 mg/dL — AB (ref 8.9–10.3)
CALCIUM: 5.5 mg/dL — AB (ref 8.9–10.3)
Calcium: 5.8 mg/dL — CL (ref 8.9–10.3)
Calcium: 5.6 mg/dL — CL (ref 8.9–10.3)
Calcium: 8.4 mg/dL — ABNORMAL LOW (ref 8.9–10.3)
Calcium: 7.2 mg/dL — ABNORMAL LOW (ref 8.9–10.3)
Calcium: 6.5 mg/dL — ABNORMAL LOW (ref 8.9–10.3)

## 2015-07-11 LAB — MAGNESIUM: Magnesium: 2 mg/dL (ref 1.7–2.4)

## 2015-07-11 MED ORDER — CALCITRIOL 0.5 MCG PO CAPS
0.5000 ug | ORAL_CAPSULE | Freq: Every day | ORAL | Status: DC
  Administered 2015-07-11 – 2015-07-12 (×2): 0.5 ug via ORAL
  Filled 2015-07-11 (×2): qty 1

## 2015-07-11 MED ORDER — CETYLPYRIDINIUM CHLORIDE 0.05 % MT LIQD
7.0000 mL | Freq: Two times a day (BID) | OROMUCOSAL | Status: DC
  Administered 2015-07-11 – 2015-07-13 (×6): 7 mL via OROMUCOSAL

## 2015-07-11 MED ORDER — SODIUM CHLORIDE 0.9 % IV SOLN
2.0000 g | Freq: Once | INTRAVENOUS | Status: AC
  Administered 2015-07-11: 2 g via INTRAVENOUS
  Filled 2015-07-11: qty 20

## 2015-07-11 MED ORDER — SODIUM CHLORIDE 0.9 % IV SOLN
6.0000 g | Freq: Once | INTRAVENOUS | Status: AC
  Administered 2015-07-11: 6 g via INTRAVENOUS
  Filled 2015-07-11 (×2): qty 60

## 2015-07-11 MED ORDER — CALCIUM GLUCONATE 10 % IV SOLN: 1.0000 mg/kg/h | INTRAVENOUS | Status: DC

## 2015-07-11 MED ORDER — CALCIUM CARBONATE 1250 MG/5ML PO SUSP
1000.0000 mg | Freq: Three times a day (TID) | ORAL | Status: DC
  Administered 2015-07-11 – 2015-07-12 (×4): 1000 mg via ORAL
  Filled 2015-07-11 (×3): qty 10
  Filled 2015-07-11 (×4): qty 5

## 2015-07-11 NOTE — Progress Notes (Signed)
Dr Alcario Drought notified of Calcium level 5.6  Pt to receive  2 gms CA Gluconate IVPB

## 2015-07-11 NOTE — Consult Note (Signed)
Renal Service Consult Note [place] 07/11/2015 [place] Requesting Physician:  Dr Ardelia Mems  Reason for Consult:  ESRD pt with hypocalcemia HPI: The patient is a 74 y.o. year-old with hx of HTN, HL, CAD, ESRD on HD and recently diagnosed bony metastases thought to be due to prostate cancer.  He was in hospital here with initial diagnosis based on high PSA and bony changes by imaging.  Was referred to OP management and says that they never did do a tissue biopsy, they told him that "it wasn't necessary".  He saw oncologist last Friday in the office in [place] and got two injections according to his brother.  He is also on Casodex 50 mg /day.  He presented to ED last night in [place] with severe muscle twitching and fatigue.  Serum Ca was 5.7 (was 8.5 when last here a few wks ago). He was admitted and given IV and po Ca overnight as well as po calcitriol.   I called Dr Jaclyn Shaggy office and they said he rec'd two injections last Friday , Denosumab 120 mg and Lupron.    Patient has no other c/o's, denies SOB, cough, CP, fevers, chills or rash.  No abd pain ,n/v/d.     Past Medical History  Past Medical History  Diagnosis Date  . Hyperlipidemia   . Gout   . Secondary hyperparathyroidism ([place])   . Anemia   . Colon, diverticulosis   . Thyroid nodule   . Anginal pain ([place])     sees Dr. Lia Foyer  . Hypertension     sees Dr. Willy Eddy  . Aneurysm artery, popliteal ([place])   . Upper respiratory infection April 2016  . CHF (congestive heart failure) ([place])   . Aneurysm of right femoral artery ([place])   . ESRD (end stage renal disease) on dialysis ([place])     "TTS; [place]" (06/15/2015)  . Renal insufficiency    Past Surgical History  Past Surgical History  Procedure Laterality Date  . Knee surgery Left     cyst removal by Dr. Lorin Mercy  . Av fistula placement Left     left UA AVF  . Femoral-popliteal bypass graft  10/18/2012    Procedure: BYPASS GRAFT  FEMORAL-POPLITEAL ARTERY;  Surgeon: Conrad Nikolski, MD;  Location: [facility] OR;  Service: Vascular;  Laterality: Right;  Right Popliteal Aneurysm Exclusion; Ultrasound guided  . False aneurysm repair Left 12/29/2012    Procedure: REPAIR FALSE ANEURYSM;  Surgeon: Conrad [place], MD;  Location: [place];  Service: Vascular;  Laterality: Left;  . Endovascular repair of popliteal artery aneurysm Left 12/29/2012    Procedure: ENDOVASCULAR REPAIR OF POPLITEAL AND FEMORAL ARTERY ANEURYSM;  Surgeon: Conrad [place], MD;  Location: [place];  Service: Vascular;  Laterality: Left;  . Abdominal aortagram N/A 09/27/2012    Procedure: ABDOMINAL Maxcine Ham;  Surgeon: Angelia Mould, MD;  Location: [facility] CATH LAB;  Service: Cardiovascular;  Laterality: N/A;  . Tonsillectomy and adenoidectomy     Family History  Family History  Problem Relation Age of Onset  . Hypertension Mother   . Diabetes Mother   . Diabetes Brother    Social History  reports that he quit smoking about 7 years ago. His smoking use included Pipe, Cigars, and Cigarettes. He quit after 50 years of use. He has never used smokeless tobacco. He reports that he drinks alcohol. He reports that he does not use illicit drugs. Allergies No Known Allergies Home medications Prior to  Admission medications   Medication Sig Start Date End Date Taking? Authorizing Provider  Albuterol Sulfate 108 (90 BASE) MCG/ACT AEPB Inhale 2 puffs into the lungs every 4 (four) hours as needed (wheezing).     Historical Provider, MD  amLODipine (NORVASC) 5 MG tablet Take 5 mg by mouth daily.  02/01/15   Historical Provider, MD  aspirin EC 81 MG EC tablet Take 1 tablet (81 mg total) by mouth daily. 06/28/15   Patrecia Pour, MD  bicalutamide (CASODEX) 50 MG tablet Take 1 tablet (50 mg total) by mouth daily. 06/16/15   Milagros Loll, MD  gabapentin (NEURONTIN) 100 MG capsule Take 1 capsule (100 mg total) by mouth 2 (two) times daily. 06/28/15   Patrecia Pour, MD  HYDROcodone-acetaminophen  (NORCO/VICODIN) 5-325 MG per tablet Take 1-2 tablets by mouth every 6 (six) hours as needed for moderate pain or severe pain. 06/17/15   Milagros Loll, MD  lanthanum (FOSRENOL) 1000 MG chewable tablet Chew 1,000 mg by mouth 2 (two) times daily with a meal.    Historical Provider, MD  metoprolol (LOPRESSOR) 50 MG tablet Take 1 tablet (50 mg total) by mouth 2 (two) times daily. 06/28/15   Patrecia Pour, MD  multivitamin (RENA-VIT) TABS tablet Take 1 tablet by mouth daily.    Historical Provider, MD  omeprazole (PRILOSEC) 40 MG capsule Take 40 mg by mouth daily.    Historical Provider, MD  simvastatin (ZOCOR) 40 MG tablet Take 40 mg by mouth every evening.    Historical Provider, MD   Liver Function Tests  Recent Labs Lab 07/10/15 2135  AST 28  ALT 12*  ALKPHOS 1960*  BILITOT 0.6  PROT 5.2*  ALBUMIN 2.8*   No results for input(s): LIPASE, AMYLASE in the last 168 hours. CBC No results for input(s): WBC, NEUTROABS, HGB, HCT, MCV, PLT in the last 168 hours. Basic Metabolic Panel  Recent Labs Lab 07/10/15 2135 07/11/15 0340 07/11/15 0516 07/11/15 0730  NA 133*  --   --   --   K 4.9  --   --   --   CL 91*  --   --   --   CO2 26  --   --   --   GLUCOSE 119*  --   --   --   BUN 33*  --   --   --   CREATININE 6.51*  --   --   --   CALCIUM 5.5* 5.6* 5.5* 5.8*  PHOS 4.2  --   --   --     Filed Vitals:   07/10/15 2310 07/11/15 0000 07/11/15 0310 07/11/15 0800  BP: 98/58 100/31 105/63 112/67  Pulse: 72 72 74 73  Temp: 98.9 F (37.2 C)  99.4 F (37.4 C) 98.5 F (36.9 C)  TempSrc: Oral  Oral Axillary  Resp: 15 20 23 15   Height:      Weight:      SpO2:  94% 100% 100%   Exam Chronically ill-appearing AAM , facial twitching is intermittent and can be significant No rash, cyanosis or gangrene Sclera anicteric, throat clear No jvd Chest clear bilat RRR no mrg Abd soft ntnd no mass or ascites 1+ pitting bilat pedal edema Neuro is alert, muscle twitching is mild and diffuse,  gen weakness, Ox 3  TTS [place]   4h  72kg  2/2.25 Ca Heparin 5000 Hect 8 [place]   Assessment: 1 Acute hypocalcemia - symptomatic, secondary to denosumab administration 5  days ago.  Hypocalcemia after denosumab is known to occur in patients who have a tendency towards hypocalcemia (CKD, malabsorption).   2 ESRD on HD tts; had HD Monday this week because Sat HD was cut short 3 Vol excess - mild edema 4 Metastatic cancer - bony mets, clinical dx of prostate Ca, f/b Dr Bobby Rumpf in [place] 5 HTN holding CCB 6 Anemia   Plan- check 25 vit D, PTH.  Aggressive IV Ca support, HD with high Ca bath today and tomorrow, oral Ca and vit D started.    Kelly Splinter MD (pgr) [phone]    [phone] 07/11/2015, 9:30 AM

## 2015-07-11 NOTE — Progress Notes (Signed)
Family Medicine Teaching Service Daily Progress Note Intern Pager: [phone]  [name] Medical record number: [id number] Date of birth: 1941-05-02 Age: 74 y.o. Gender: male  Primary Care Provider: [name], MD Consultants: nephrology  Code Status: Full (confirmed on admission)  Pt Overview and Major Events to Date:  10/5: Transferred to [facility] from [place] and Plan: 74 y.o. male transfer from [place] for symptomatic hypocalcemia and volume overload ESRD, in setting of metastatic prostate CA.   Hypocalcemia: Per nephrology, likely secondary to denosumab admin 5 days ago as it is known to cause hypocalcemia. Symptomatic with resting diffuse tetany and Chvostek's sign. Serum Ca 5.6 , ALP 1960. S/p 6 grams of Calcium. EKG performed and is NSR - Nephrology consulted, appreciate their assistance - Aggressive IV Ca supplementation; IV Calcium gluconate 6 grams - PO Calcium carbonate 1000mg  TID - Calcitriol 0.5 mcg daily - Check Ca q2h - f/u PTH, Vit D, Mag labs  ESRD: Last HD on Monday, had to go in early because his HD was cute short on Saturday. Usually goes t/th/sa. No urination in the last 3 days, this is normal for patient but he did have suprapubic tenderness on palpation.  - Bladder scan to ensure no urinary retention - Per renal, will receive HD today and tomorrow with Ca bath - f/u with Nephrology   Cardiac: BP stable. Patient does have some  Bilateral LE edema on exam - Hold CCB  Prostate Cancer. Metastatic; bony metastasis. Follows with Dr. Bobby Rumpf in [place] and Bicalutamide - Consult palliative care, appreciate their assistance  FEN/GI: renal diet  PPx: Heparin  Disposition: Admit to FPTS for correction of hypocalcemia and Hemodialysis  Subjective:  Patient laying in bed this morning and continues to have diffuse twitching throughout body. He ate a full breakfast this morning. He does endorse the feeling of having to  urinate, states he has not urinated in 3 days. Last BM was yesterday. He uses a walker to ambulate. Brother is at bedside.   Objective: Temp:  [98.4 F (36.9 C)-99.4 F (37.4 C)] 99.4 F (37.4 C) (10/05 0310) Pulse Rate:  [72-77] 74 (10/05 0310) Resp:  [15-23] 23 (10/05 0310) BP: (91-206)/(31-144) 105/63 mmHg (10/05 0310) SpO2:  [94 %-100 %] 100 % (10/05 0310) Weight:  [73.755 kg (162 lb 9.6 oz)] 73.755 kg (162 lb 9.6 oz) (10/04 2000) Physical Exam: General: Uncomfortable, diffuse twitching. AO x3, pleasant Cardiovascular: RRR, normal s1 and s2, no rubs gallops or murmurs. 2+ pedal edema bilaterally. Palpable dorsalis pedis pulses. Fistula present on left forearm.  Respiratory: CTAB Abdomen: soft, positive BS, tenderness on palpation of suprapubic area Extremities: Fistula on left forearm for HD  Neuro: Diffuse tetany and twitching, mostly in facial area and bilateral upper extremities. Positive Chvostek's sign. Weakness in lower extremities. Has good grip strength in his hands.      Laboratory: No results for input(s): WBC, HGB, HCT, PLT in the last 168 hours.  Recent Labs Lab 07/10/15 2135 07/11/15 0340 07/11/15 0516 07/11/15 0730  NA 133*  --   --   --   K 4.9  --   --   --   CL 91*  --   --   --   CO2 26  --   --   --   BUN 33*  --   --   --   CREATININE 6.51*  --   --   --   CALCIUM 5.5* 5.6* 5.5*  5.8*  PROT 5.2*  --   --   --   BILITOT 0.6  --   --   --   ALKPHOS 1960*  --   --   --   ALT 12*  --   --   --   AST 28  --   --   --   GLUCOSE 119*  --   --   --       Imaging/Diagnostic Tests: EKG: NSR   Carlyle Dolly, MD 07/11/2015, 8:31 AM PGY-1, [place] Intern pager: [phone], text pages welcome

## 2015-07-11 NOTE — Progress Notes (Signed)
Dr Alcario Drought notified of CA  drawn at 0516  resulted as   5.5.  Pt currently receiving Ca Gluconate 2gms IVPB.  No further orders at this time. Facial twitching persists but pt states he feels it is a little less since arriving at [facility].

## 2015-07-12 DIAGNOSIS — Z515 Encounter for palliative care: Secondary | ICD-10-CM

## 2015-07-12 DIAGNOSIS — I5032 Chronic diastolic (congestive) heart failure: Secondary | ICD-10-CM

## 2015-07-12 LAB — CALCIUM
CALCIUM: 6.9 mg/dL — AB (ref 8.9–10.3)
CALCIUM: 8 mg/dL — AB (ref 8.9–10.3)
Calcium: 6.6 mg/dL — ABNORMAL LOW (ref 8.9–10.3)
Calcium: 6.7 mg/dL — ABNORMAL LOW (ref 8.9–10.3)
Calcium: 6.3 mg/dL — CL (ref 8.9–10.3)

## 2015-07-12 LAB — COMPREHENSIVE METABOLIC PANEL
ALBUMIN: 2.6 g/dL — AB (ref 3.5–5.0)
ALT: 11 U/L — ABNORMAL LOW (ref 17–63)
ANION GAP: 14 (ref 5–15)
AST: 20 U/L (ref 15–41)
Alkaline Phosphatase: 1764 U/L — ABNORMAL HIGH (ref 38–126)
BUN: 19 mg/dL (ref 6–20)
CHLORIDE: 93 mmol/L — AB (ref 101–111)
CO2: 27 mmol/L (ref 22–32)
Calcium: 6.5 mg/dL — ABNORMAL LOW (ref 8.9–10.3)
Creatinine, Ser: 4.7 mg/dL — ABNORMAL HIGH (ref 0.61–1.24)
GFR calc Af Amer: 13 mL/min — ABNORMAL LOW (ref >60)
GFR calc non Af Amer: 11 mL/min — ABNORMAL LOW (ref >60)
GLUCOSE: 103 mg/dL — AB (ref 65–99)
POTASSIUM: 4.3 mmol/L (ref 3.5–5.1)
SODIUM: 134 mmol/L — AB (ref 135–145)
TOTAL PROTEIN: 5.3 g/dL — AB (ref 6.5–8.1)
Total Bilirubin: 0.7 mg/dL (ref 0.3–1.2)

## 2015-07-12 LAB — CBC
HEMATOCRIT: 23.6 % — AB (ref 39.0–52.0)
HEMATOCRIT: 24.5 % — AB (ref 39.0–52.0)
HEMOGLOBIN: 7.5 g/dL — AB (ref 13.0–17.0)
Hemoglobin: 7.6 g/dL — ABNORMAL LOW (ref 13.0–17.0)
MCH: 31 pg (ref 26.0–34.0)
MCH: 30.2 pg (ref 26.0–34.0)
MCHC: 31.8 g/dL (ref 30.0–36.0)
MCHC: 31 g/dL (ref 30.0–36.0)
MCV: 97.5 fL (ref 78.0–100.0)
MCV: 97.2 fL (ref 78.0–100.0)
PLATELETS: 85 10*3/uL — AB (ref 150–400)
Platelets: 90 10*3/uL — ABNORMAL LOW (ref 150–400)
RBC: 2.42 MIL/uL — AB (ref 4.22–5.81)
RBC: 2.52 MIL/uL — AB (ref 4.22–5.81)
RDW: 20.6 % — ABNORMAL HIGH (ref 11.5–15.5)
RDW: 20.6 % — AB (ref 11.5–15.5)
WBC: 3.7 10*3/uL — AB (ref 4.0–10.5)
WBC: 4.1 10*3/uL (ref 4.0–10.5)

## 2015-07-12 LAB — VITAMIN D 25 HYDROXY (VIT D DEFICIENCY, FRACTURES): Vit D, 25-Hydroxy: 16.7 ng/mL — ABNORMAL LOW (ref 30.0–100.0)

## 2015-07-12 LAB — TYPE AND SCREEN
ABO/RH(D): O POS
Antibody Screen: NEGATIVE

## 2015-07-12 LAB — CALCIUM, IONIZED
Calcium, Ionized, Serum: <3 mg/dL — ABNORMAL LOW (ref 4.5–5.6)
Calcium, Ionized, Serum: <3 mg/dL — ABNORMAL LOW (ref 4.5–5.6)

## 2015-07-12 LAB — PARATHYROID HORMONE, INTACT (NO CA): PTH: 671 pg/mL — AB (ref 15–65)

## 2015-07-12 LAB — PTH, INTACT AND CALCIUM
CALCIUM TOTAL (PTH): 5.6 mg/dL — AB (ref 8.6–10.2)
PTH: 1538 pg/mL — AB (ref 15–65)

## 2015-07-12 LAB — PHOSPHORUS: Phosphorus: 3 mg/dL (ref 2.5–4.6)

## 2015-07-12 LAB — MAGNESIUM: Magnesium: 2 mg/dL (ref 1.7–2.4)

## 2015-07-12 MED ORDER — LIDOCAINE HCL (PF) 1 % IJ SOLN: 5.0000 mL | INTRAMUSCULAR | Status: DC | PRN

## 2015-07-12 MED ORDER — HEPARIN SODIUM (PORCINE) 1000 UNIT/ML DIALYSIS: 5000.0000 [IU] | Freq: Once | INTRAMUSCULAR | Status: DC

## 2015-07-12 MED ORDER — SODIUM CHLORIDE 0.9 % IV SOLN
2.0000 g | Freq: Once | INTRAVENOUS | Status: AC
  Administered 2015-07-12: 2 g via INTRAVENOUS
  Filled 2015-07-12: qty 20

## 2015-07-12 MED ORDER — SODIUM CHLORIDE 0.9 % IV SOLN: 100.0000 mL | INTRAVENOUS | Status: DC | PRN

## 2015-07-12 MED ORDER — HEPARIN SODIUM (PORCINE) 1000 UNIT/ML DIALYSIS
5000.0000 [IU] | Freq: Once | INTRAMUSCULAR | Status: DC
  Filled 2015-07-12: qty 5

## 2015-07-12 MED ORDER — POTASSIUM CHLORIDE CRYS ER 20 MEQ PO TBCR
40.0000 meq | EXTENDED_RELEASE_TABLET | Freq: Once | ORAL | Status: AC
  Administered 2015-07-12: 40 meq via ORAL
  Filled 2015-07-12: qty 2

## 2015-07-12 MED ORDER — PENTAFLUOROPROP-TETRAFLUOROETH EX AERO: 1.0000 "application " | INHALATION_SPRAY | CUTANEOUS | Status: DC | PRN

## 2015-07-12 MED ORDER — POTASSIUM CHLORIDE CRYS ER 20 MEQ PO TBCR: 40.0000 meq | EXTENDED_RELEASE_TABLET | Freq: Two times a day (BID) | ORAL | Status: DC

## 2015-07-12 MED ORDER — HEPARIN SODIUM (PORCINE) 1000 UNIT/ML DIALYSIS: 1000.0000 [IU] | INTRAMUSCULAR | Status: DC | PRN

## 2015-07-12 MED ORDER — ALTEPLASE 2 MG IJ SOLR
2.0000 mg | Freq: Once | INTRAMUSCULAR | Status: DC | PRN
Start: 2015-07-12 — End: 2015-07-12

## 2015-07-12 MED ORDER — LIDOCAINE-PRILOCAINE 2.5-2.5 % EX CREA
1.0000 "application " | TOPICAL_CREAM | CUTANEOUS | Status: DC | PRN
  Filled 2015-07-12: qty 5

## 2015-07-12 MED ORDER — CALCIUM GLUCONATE 10 % IV SOLN: 4.0000 g | Freq: Once | INTRAVENOUS | Status: DC

## 2015-07-12 MED ORDER — LIDOCAINE-PRILOCAINE 2.5-2.5 % EX CREA: 1.0000 "application " | TOPICAL_CREAM | CUTANEOUS | Status: DC | PRN

## 2015-07-12 MED ORDER — ALTEPLASE 2 MG IJ SOLR
2.0000 mg | Freq: Once | INTRAMUSCULAR | Status: DC | PRN
  Filled 2015-07-12: qty 2

## 2015-07-12 NOTE — Progress Notes (Signed)
Family Medicine Teaching Service Daily Progress Note Intern Pager: [phone]  Patient name: ISAYAH IGNASIAK Medical record number: [id number] Date of birth: [birth year] Age: 74 y.o. Gender: male  Primary Care Provider: [name], MD Consultants: nephrology  Code Status: Full (confirmed on admission)  Pt Overview and Major Events to Date:  10/5: Transferred to [facility] from [place]. HD   10/6: HD  Assessment and Plan: 74 y.o. male transfer from [place] for symptomatic hypocalcemia and volume overload ESRD, in setting of metastatic prostate CA.   Hypocalcemia: Per nephrology, likely secondary to denosumab admin 5 days ago as it is known to cause hypocalcemia. Was symptomatic with resting diffuse tetany and Chvostek's sign. EKG performed and is NSR. ALP 1960>>1764 . Aggressive IV calcium supplementation was provided yesterday. Serum calcium got up to 8.4 yesterday and went as low as 6.5 overnight. PTH 671 (high), Vit D 16.7 (low). Mag 2. Serum Ca 6.7 now, 7.8 corrected with hypoalbumin. Only minor twitching on right side of face now.  - Nephrology consulted, appreciate their assistance - PO Calcium carbonate 1000mg  TID - Calcitriol 0.5 mcg daily - Check Ca q2h  ESRD: Last HD on Monday, had to go in early because his HD was cute short on Saturday. Usually goes t/th/sa. Urination overnight. Bladder scan showed no signs of urinary retention.  - Per nephrology, will receive HD today with Ca bath - f/u with Nephrology   Cardiac: BP stable. Patient does have some  Bilateral LE edema on exam - Hold CCB  Prostate Cancer. Metastatic; bony metastasis. Follows with Dr. Bobby Rumpf in [place].  - Lupron and Bicalutamide - Palliative care consulted, appreciate their assistance - Touch base with Dr. Bobby Rumpf as family has questions about prognosis  A-fib; not a candidate for anticoagulation. Stable rate - Continue to monitor on tele  Anemia: Hgb 7.5 today - CBC tonight. Low threshold for  transfusion  FEN/GI: renal diet  PPx: Heparin  Disposition: pending   Subjective:  Patient did well overnight. Did have to have 4 grams of Calcium supplementation overnight. He remained hemodynamically stable. Patient is sitting up in bed this morning, twitching and muscle spasms have vastly improved since yesterday. Now only residual twitching on right side of face. Does endorse some minor suprapubic pain and increased urgency.    Objective: Temp:  [97.5 F (36.4 C)-98.7 F (37.1 C)] 98.7 F (37.1 C) (10/05 2345) Pulse Rate:  [61-103] 82 (10/05 2345) Resp:  [14-29] 20 (10/05 2345) BP: (97-152)/(47-86) 103/71 mmHg (10/05 2345) SpO2:  [93 %-100 %] 93 % (10/05 2345) Physical Exam: General: AO x3, pleasant Cardiovascular: RRR, normal s1 and s2, no rubs gallops or murmurs. 2+ pedal edema bilaterally. Palpable dorsalis pedis pulses. Fistula present on left forearm.  Respiratory: CTAB Abdomen: soft, positive BS, tenderness on palpation of suprapubic area Extremities: Fistula on left forearm for HD  Neuro: Positive twitching on right side of face. Weakness in lower extremities. Has good grip strength in his hands.      Laboratory: No results for input(s): WBC, HGB, HCT, PLT in the last 168 hours.  Recent Labs Lab 07/10/15 2135  07/12/15 0202 07/12/15 0325 07/12/15 0535  NA 133*  --   --  134*  --   K 4.9  --   --  4.3  --   CL 91*  --   --  93*  --   CO2 26  --   --  27  --   BUN 33*  --   --  19  --   CREATININE 6.51*  --   --  4.70*  --   CALCIUM 5.5*  < > 6.6* 6.5* 6.7*  PROT 5.2*  --   --  5.3*  --   BILITOT 0.6  --   --  0.7  --   ALKPHOS 1960*  --   --  1764*  --   ALT 12*  --   --  11*  --   AST 28  --   --  20  --   GLUCOSE 119*  --   --  103*  --   < > = values in this interval not displayed.    Imaging/Diagnostic Tests: EKG: NSR   Carlyle Dolly, MD 07/12/2015, 3:26 AM PGY-1, [place] Intern pager: [phone], text pages  welcome

## 2015-07-12 NOTE — Progress Notes (Signed)
Consult received, pertinent medical information reviewed.  Patient known to the undersigned provider and to the palliative service since previous hospitalization.  Discussed with MS4 Dr Lisabeth Devoid.  Patient seen briefly this am, wife not at bedside. Patient wishes for [place] to have detailed discussions regarding his health when his wife is present.   PLAN: Family meeting with patient and wife on 07-13-15 at 79.  Further recommendations will follow To continue current care Appreciate renal input  Loistine Chance MD [facility] health palliative medicine team [phone]

## 2015-07-12 NOTE — Progress Notes (Signed)
Utilization Review Completed.  

## 2015-07-12 NOTE — Progress Notes (Signed)
[facility] Progress Note   Subjective: feeling better, Ca up to mid 6's  Filed Vitals:   07/12/15 0449 07/12/15 0634 07/12/15 0737 07/12/15 0947  BP: 122/70  126/73 118/59  Pulse: 104  89 92  Temp: 98.7 F (37.1 C)  97.2 F (36.2 C)   TempSrc: Oral  Oral   Resp: 17  19   Height:      Weight:  74.798 kg (164 lb 14.4 oz)    SpO2: 96%  97%    Exam: Chronically ill-appearing AAM , facial twitching resolved No rash, cyanosis or gangrene No jvd Chest clear bilat RRR no mrg Abd soft ntnd no mass or ascites 1+ pitting bilat pedal edema Neuro is alert, Ox 3  TTS [place] 4h 72kg 2/2.25 Ca Heparin 5000 Hect 8 Micera   Assessment: 1 Acute hypocalcemia - improving. Secondary to denosumab administration last week. Hypocalcemia after denosumab is known to occur in patients who have a tendency towards hypocalcemia (CKD, malabsorption) as it works by blocking Ca release from bone but not absorption. Getting po / IV Ca replacement.  Will resume OP hectorol 8 ug tiw and stop po rocaltrol and they serve the same purpose. Use high Ca bath as long as serum Ca remains low.  2 ESRD on HD tts 3 Vol excess - mild edema 4 Metastatic cancer - bony mets, clinical dx of prostate Ca, f/b Dr Bobby Rumpf in [place] on Lupron and Casodex 5 HTN holding CCB 6 Anemia  Plan - short HD today to get back on schedule, high Ca (3.5) bath    Kelly Splinter MD  pager [phone]    cell [phone]  07/12/2015, 10:16 AM     Recent Labs Lab 07/10/15 2135  07/12/15 0325 07/12/15 0535 07/12/15 0835  NA 133*  --  134*  --   --   K 4.9  --  4.3  --   --   CL 91*  --  93*  --   --   CO2 26  --  27  --   --   GLUCOSE 119*  --  103*  --   --   BUN 33*  --  19  --   --   CREATININE 6.51*  --  4.70*  --   --   CALCIUM 5.5*  < > 6.5* 6.7* 6.9*  PHOS 4.2  --  3.0  --   --   < > = values in this interval not displayed.  Recent Labs Lab 07/10/15 2135 07/12/15 0325  AST 28 20  ALT 12* 11*   ALKPHOS 1960* 1764*  BILITOT 0.6 0.7  PROT 5.2* 5.3*  ALBUMIN 2.8* 2.6*    Recent Labs Lab 07/12/15 0325  WBC 3.7*  HGB 7.5*  HCT 23.6*  MCV 97.5  PLT 85*   . antiseptic oral rinse  7 mL Mouth Rinse BID  . aspirin EC  81 mg Oral Daily  . bicalutamide  50 mg Oral Daily  . calcitRIOL  0.5 mcg Oral Daily  . calcium carbonate (dosed in mg elemental calcium)  1,000 mg of elemental calcium Oral TID BM  . gabapentin  100 mg Oral BID  . heparin  5,000 Units Subcutaneous 3 times per day  . heparin  5,000 Units Dialysis Once in dialysis  . lanthanum  1,000 mg Oral BID WC  . metoprolol  50 mg Oral BID  . multivitamin  1 tablet Oral Daily  . pantoprazole  80 mg Oral Daily  .  simvastatin  40 mg Oral QPM     sodium chloride, sodium chloride, albuterol, alteplase, heparin, HYDROcodone-acetaminophen, lidocaine (PF), lidocaine-prilocaine, pentafluoroprop-tetrafluoroeth

## 2015-07-12 NOTE — Progress Notes (Signed)
Patient calcium level 6.5.Dr.Ganbino notified.Awaiting new new orders will continue to monitor patient.

## 2015-07-13 LAB — CBC
HEMATOCRIT: 26 % — AB (ref 39.0–52.0)
Hemoglobin: 8.3 g/dL — ABNORMAL LOW (ref 13.0–17.0)
MCH: 31 pg (ref 26.0–34.0)
MCHC: 31.9 g/dL (ref 30.0–36.0)
MCV: 97 fL (ref 78.0–100.0)
Platelets: 86 10*3/uL — ABNORMAL LOW (ref 150–400)
RBC: 2.68 MIL/uL — ABNORMAL LOW (ref 4.22–5.81)
RDW: 20.2 % — AB (ref 11.5–15.5)
WBC: 3.9 10*3/uL — AB (ref 4.0–10.5)

## 2015-07-13 LAB — BASIC METABOLIC PANEL
Anion gap: 15 (ref 5–15)
BUN: 18 mg/dL (ref 6–20)
CHLORIDE: 95 mmol/L — AB (ref 101–111)
CO2: 26 mmol/L (ref 22–32)
Calcium: 7.5 mg/dL — ABNORMAL LOW (ref 8.9–10.3)
Creatinine, Ser: 4.49 mg/dL — ABNORMAL HIGH (ref 0.61–1.24)
GFR calc Af Amer: 14 mL/min — ABNORMAL LOW (ref >60)
GFR calc non Af Amer: 12 mL/min — ABNORMAL LOW (ref >60)
GLUCOSE: 103 mg/dL — AB (ref 65–99)
POTASSIUM: 4.3 mmol/L (ref 3.5–5.1)
Sodium: 136 mmol/L (ref 135–145)

## 2015-07-13 LAB — CALCIUM
CALCIUM: 7.5 mg/dL — AB (ref 8.9–10.3)
CALCIUM: 7 mg/dL — AB (ref 8.9–10.3)
CALCIUM: 6.8 mg/dL — AB (ref 8.9–10.3)
Calcium: 7.4 mg/dL — ABNORMAL LOW (ref 8.9–10.3)
Calcium: 7.2 mg/dL — ABNORMAL LOW (ref 8.9–10.3)
Calcium: 7.3 mg/dL — ABNORMAL LOW (ref 8.9–10.3)
Calcium: 7.2 mg/dL — ABNORMAL LOW (ref 8.9–10.3)
Calcium: 7.2 mg/dL — ABNORMAL LOW (ref 8.9–10.3)

## 2015-07-13 LAB — MAGNESIUM: MAGNESIUM: 2.2 mg/dL (ref 1.7–2.4)

## 2015-07-13 LAB — PHOSPHORUS: Phosphorus: 2.4 mg/dL — ABNORMAL LOW (ref 2.5–4.6)

## 2015-07-13 MED ORDER — CALCIUM CARBONATE ANTACID 500 MG PO CHEW
800.0000 mg | CHEWABLE_TABLET | Freq: Three times a day (TID) | ORAL | Status: DC
  Administered 2015-07-13 – 2015-07-14 (×2): 800 mg via ORAL
  Filled 2015-07-13 (×2): qty 4

## 2015-07-13 MED ORDER — DOXERCALCIFEROL 4 MCG/2ML IV SOLN
8.0000 ug | INTRAVENOUS | Status: DC
  Administered 2015-07-14: 8 ug via INTRAVENOUS
  Filled 2015-07-13: qty 4

## 2015-07-13 MED ORDER — SODIUM CHLORIDE 0.9 % IV SOLN
2.0000 g | Freq: Once | INTRAVENOUS | Status: DC
  Administered 2015-07-13: 2 g via INTRAVENOUS
  Filled 2015-07-13: qty 20

## 2015-07-13 MED ORDER — SODIUM CHLORIDE 0.9 % IV SOLN
2.0000 g | Freq: Once | INTRAVENOUS | Status: AC
  Administered 2015-07-13: 2 g via INTRAVENOUS
  Filled 2015-07-13: qty 20

## 2015-07-13 NOTE — Care Management Note (Signed)
Case Management Note  Patient Details  Name: SHAHRUKH PASCH MRN: [id number] Date of Birth: 09-03-1941  Subjective/Objective:    Pt lives with spouse, has rolling walker, is active with [facility] for RN and PT services.                Action/Plan: CM will follow for discharge needs.        Expected Discharge Plan:  [place]  Discharge planning Services  CM Consult  Post Acute Care Choice:  Resumption of Svcs/PTA Provider  HH Arranged:  RN, PT [facility] Agency:  [facility]  Status of Service:  In process, will continue to follow  Girard Cooter, RN 07/13/2015, 8:54 AM

## 2015-07-13 NOTE — Consult Note (Signed)
Consultation Note Date: 07/13/2015   [name]  DOB: 02-Nov-1940  MRN: [id number]  Age / Sex: 74 y.o., male   PCP: Estill Bamberg. Megan Salon, MD Referring Physician: Leeanne Rio, MD  Reason for Consultation: Establishing goals of care  Palliative Care Assessment and Plan Summary of Established Goals of Care and Medical Treatment Preferences   74 y.o. male transfer from [place] for symptomatic hypocalcemia and volume overload ESRD, in setting of metastatic prostate CA. Patient known to palliative service from previous hospitalization. Palliative care consulted for further goals of care discussions and code status discussions.   The patient is resting in a chair, his brother and his wife are at the bedside. The patient is from [place], [place]. Overall, the patient's pain is managed well at home. Patient's wife has completed advanced directives. Discussed code status with the patient, his wife and brother in detail. CPR, intubation, etc all discussed in great detail.   Discussed the patient's underlying conditions. DNR DNI will be established. Ok to D/C home with home health care when deemed medically stable.   Thank you for the consult.    Contacts/Participants in Discussion: Primary Decision Maker:  Patient, spouse   HCPOA: yes     Code Status/Advance Care Planning:   extensive code status discussions undertaken. DNR DNI   Symptom Management:   Continue pain management.   Palliative Prophylaxis: yes  Additional Recommendations (Limitations, Scope, Preferences):  As above Psycho-social/Spiritual:   Support System: strong, patient's wife and brother  Desire for further Chaplaincy support:no  Prognosis: < 6 months  Discharge Planning:  Home with Home Health   Values: goals are palliative in nature.  Life limiting illness: metastatic prostate cancer, ESRD on HD      Chief Complaint/History of Present Illness: twitching.   Primary Diagnoses    Present on Admission:  . End stage renal disease ([place]) . Prostate cancer metastatic to bone ([place]) . Essential hypertension . Hypocalcemia . Chronic diastolic CHF (congestive heart failure) (HCC)  Palliative Review of Systems: Completed  I have reviewed the medical record, interviewed the patient and family, and examined the patient. The following aspects are pertinent.  Past Medical History  Diagnosis Date  . Hyperlipidemia   . Gout   . Secondary hyperparathyroidism ([place])   . Anemia   . Colon, diverticulosis   . Thyroid nodule   . Anginal pain ([place])     sees Dr. Lia Foyer  . Hypertension     sees Dr. Willy Eddy  . 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 Upper respiratory infection April 2016  . CHF (congestive heart failure) ([place])   . 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 ESRD (end stage renal disease) on dialysis ([place])     "TTS; [place]" (06/15/2015)  . Renal insufficiency    Social History   Social History  . Marital Status: Married    Spouse Name: N/A  . Number of Children: N/A  . Years of Education: N/A   Social History Main Topics  . Smoking status: Former Smoker -- 50 years    Types: Pipe, Cigars, Cigarettes    Quit date: 09/18/2007  . Smokeless tobacco: Never Used     Comment: "mostly smoked cigars and pipes"  . Alcohol Use: Yes     Comment: "stopped drinking in ~ 2008"  . Drug Use: No  . Sexual Activity: Not Currently   Other Topics Concern  . None   Social History Narrative   Family History  Problem Relation Age of Onset  . Hypertension Mother   . Diabetes Mother   . Diabetes Brother    Scheduled Meds: . antiseptic oral rinse  7 mL Mouth Rinse BID  . aspirin EC  81 mg Oral Daily  . bicalutamide  50 mg Oral Daily  . calcium carbonate  800 mg of elemental calcium Oral TID WC  . [START ON 07/14/2015] doxercalciferol  8 mcg Intravenous Q T,Th,Sa-HD  . gabapentin  100 mg Oral BID  . heparin  5,000 Units Subcutaneous 3 times per day  . metoprolol   50 mg Oral BID  . multivitamin  1 tablet Oral Daily  . pantoprazole  80 mg Oral Daily  . simvastatin  40 mg Oral QPM   Continuous Infusions:  PRN Meds:.albuterol, HYDROcodone-acetaminophen Medications Prior to Admission:  Prior to Admission medications   Medication Sig Start Date End Date Taking? Authorizing Provider  Albuterol Sulfate 108 (90 BASE) MCG/ACT AEPB Inhale 2 puffs into the lungs every 4 (four) hours as needed (wheezing).    Yes Historical Provider, MD  amLODipine (NORVASC) 5 MG tablet Take 5 mg by mouth daily.  02/01/15  Yes Historical Provider, MD  aspirin EC 81 MG EC tablet Take 1 tablet (81 mg total) by mouth daily. 06/28/15  Yes Patrecia Pour, MD  bicalutamide (CASODEX) 50 MG tablet Take 1 tablet (50 mg total) by mouth daily. 06/16/15  Yes Milagros Loll, MD  gabapentin (NEURONTIN) 100 MG capsule Take 1 capsule (100 mg total) by mouth 2 (two) times daily. 06/28/15  Yes Patrecia Pour, MD  HYDROcodone-acetaminophen (NORCO/VICODIN) 5-325 MG per tablet Take 1-2 tablets by mouth every 6 (six) hours as needed for moderate pain or severe pain. 06/17/15  Yes Milagros Loll, MD  lanthanum (FOSRENOL) 1000 MG chewable tablet Chew 1,000 mg by mouth 2 (two) times daily with a meal.   Yes Historical Provider, MD  metoprolol (LOPRESSOR) 50 MG tablet Take 1 tablet (50 mg total) by mouth 2 (two) times daily. 06/28/15  Yes Patrecia Pour, MD  morphine (MSIR) 15 MG tablet Take 15 mg by mouth every 6 (six) hours as needed for moderate pain or severe pain.  07/06/15  Yes Historical Provider, MD  multivitamin (RENA-VIT) TABS tablet Take 1 tablet by mouth daily.   Yes Historical Provider, MD  omeprazole (PRILOSEC) 40 MG capsule Take 40 mg by mouth daily.   Yes Historical Provider, MD  simvastatin (ZOCOR) 40 MG tablet Take 40 mg by mouth every evening.   Yes Historical Provider, MD  tamsulosin (FLOMAX) 0.4 MG CAPS capsule Take 0.4 mg by mouth at bedtime. 07/09/15  Yes Historical Provider, MD   No Known  Allergies CBC:    Component Value Date/Time   WBC 3.9* 07/13/2015 0502   HGB 8.3* 07/13/2015 0502   HCT 26.0* 07/13/2015 0502   PLT 86* 07/13/2015 0502   MCV 97.0 07/13/2015 0502   NEUTROABS 4.5 06/26/2015 0349   LYMPHSABS 2.1 06/26/2015 0349   MONOABS 0.3 06/26/2015 0349   EOSABS 0.0 06/26/2015 0349   BASOSABS 0.0 06/26/2015 0349   Comprehensive Metabolic Panel:    Component Value Date/Time   NA 136 07/13/2015 0502   K 4.3 07/13/2015 0502   CL 95* 07/13/2015 0502   CO2 26 07/13/2015 0502   BUN 18 07/13/2015 0502   CREATININE 4.49* 07/13/2015 0502   CREATININE 9.25* 10/01/2012 1510   GLUCOSE 103* 07/13/2015 0502   CALCIUM 7.2* 07/13/2015 1545   CALCIUM 5.6*  07/10/2015 2200   AST 20 07/12/2015 0325   ALT 11* 07/12/2015 0325   ALKPHOS 1764* 07/12/2015 0325   BILITOT 0.7 07/12/2015 0325   PROT 5.3* 07/12/2015 0325   ALBUMIN 2.6* 07/12/2015 0325    Physical Exam: Vital Signs: BP 108/62 mmHg  Pulse 100  Temp(Src) 98.4 F (36.9 C) (Oral)  Resp 24  Ht 5\' 8"  (1.727 m)  Wt 72.6 kg (160 lb 0.9 oz)  BMI 24.34 kg/m2  SpO2 94% SpO2: SpO2: 94 % O2 Device: O2 Device: Not Delivered O2 Flow Rate: O2 Flow Rate (L/min): 2 L/min Intake/output summary:  Intake/Output Summary (Last 24 hours) at 07/13/15 1717 Last data filed at 07/13/15 1345  Gross per 24 hour  Intake    600 ml  Output      0 ml  Net    600 ml   LBM: Last BM Date: 07/12/15 Baseline Weight: Weight: 73.755 kg (162 lb 9.6 oz) Most recent weight: Weight: 72.6 kg (160 lb 0.9 oz)  Exam Findings:   awake alert in no distress denies any pain Clear S1S2 Abdomen soft                        Palliative Performance Scale: 40% Additional Data Reviewed: Recent Labs     07/12/15  0325  07/12/15  1328  07/13/15  0502  WBC  3.7*  4.1  3.9*  HGB  7.5*  7.6*  8.3*  PLT  85*  90*  86*  NA  134*   --   136  BUN  19   --   18  CREATININE  4.70*   --   4.49*     Time In: 1545 Time Out: 1645 Time Total: 60  min  Greater than 50%  of this time was spent counseling and coordinating care related to the above assessment and plan.  Signed by: Loistine Chance, MD [place], MD  07/13/2015, 5:17 PM  Please contact Palliative Medicine Team phone at [phone] for questions and concerns.

## 2015-07-13 NOTE — Clinical Documentation Improvement (Signed)
Family Medicine  Can the diagnosis of anemia be further specified?   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  ESRD  Prostate Cancer with mets to Bone  Supporting Information:  H&H 7.2 / 24.5  Please exercise your independent, professional judgment when responding. A specific answer is not anticipated or expected.  Thank You,  Zoila Shutter BSN, [place] [phone]

## 2015-07-13 NOTE — Care Management Important Message (Signed)
Important Message  Patient Details  Name: Tim Kirby MRN: [id number] Date of Birth: [birth year]   Medicare Important Message Given:  Yes-second notification given    Nathen May 07/13/2015, 11:55 AM

## 2015-07-13 NOTE — Progress Notes (Signed)
Family Medicine Teaching Service Daily Progress Note Intern Pager: [phone]  [name] Medical record number: [id number] Date of birth: June 16, 1941 Age: 74 y.o. Gender: male  Primary Care Provider: [name], MD Consultants: nephrology  Code Status: Full (confirmed on admission)  Pt Overview and Major Events to Date:  10/5: Transferred to [facility] from [place]. HD   10/6: HD 10/7: Hypocalcemia improving. Meeting with Palliative Care  Assessment and Plan: 74 y.o. male transfer from [place] for symptomatic hypocalcemia and volume overload ESRD, in setting of metastatic prostate CA.   Hypocalcemia: Per nephrology, likely secondary to denosumab admin about a week ago as it is known to cause hypocalcemia. Was symptomatic with resting diffuse tetany and Chvostek's sign. Aggressive IV calcium supplementation was provided. EKG performed and is NSR. ALP 1960>>1764 . Serum calcium 7.3 now, 8.4 corrected for low albumin.  - Nephrology consulted, appreciate their assistance - PO Calcium carbonate 1000mg  TID - Calcitriol 0.5 mcg daily - Check Ca q3h - Give 2g of IV calcium gluconate if needed  ESRD: Usually goes to hemodialysis T/Th/Sa. Received HD with Ca bath on 10/5 and 10/6.  - f/u with Nephrology   - Check phos and mag, consider adding renal function panel in the future  Cardiac: BP stable. Patient does have some bilateral LE edema on exam - Hold CCB  Prostate Cancer. Metastatic; bony metastasis. Follows with Dr. Bobby Rumpf in [place]. Dr. Bobby Rumpf was called yesterday.  - Lupron and Bicalutamide - Palliative care consulted, appreciate their assistance. Family meeting later today at 4pm.   A-fib; Chronic. Not a candidate for anticoagulation. Stable rate - Continue to monitor on tele  Anemia: Hgb 8.3 - Daily CBC. Low threshold for transfusion  FEN/GI: renal diet  PPx: Heparin  Disposition: home pending improvement of hypocalcemia  Subjective:  Patient did well  overnight. Did have to have 2 grams of Calcium supplementation overnight. He remained hemodynamically stable. Patient is sitting up in bed this morning with no twitching or muscle spasms. Discussed plan for palliative care meeting today at [place] which his wife will be attending.   Objective: Temp:  [97.2 F (36.2 C)-98.7 F (37.1 C)] 98.6 F (37 C) (10/06 2338) Pulse Rate:  [60-110] 60 (10/07 0000) Resp:  [14-20] 20 (10/07 0000) BP: (99-148)/(59-97) 120/73 mmHg (10/07 0000) SpO2:  [94 %-100 %] 99 % (10/07 0000) Weight:  [160 lb 0.9 oz (72.6 kg)-164 lb 14.4 oz (74.798 kg)] 160 lb 0.9 oz (72.6 kg) (10/06 1655) Physical Exam: General: AO x3, pleasant Cardiovascular: RRR, normal s1 and s2, no rubs gallops or murmurs. 1+ pedal edema bilaterally. Palpable dorsalis pedis pulses. Fistula present on left forearm.  Respiratory: CTAB Abdomen: soft, positive BS, non-tender, non-distended Extremities: Fistula on left forearm for HD  Neuro: No twitching or muscle spasms. No gross motor defecits.       Laboratory:  Recent Labs Lab 07/12/15 0325 07/12/15 1328  WBC 3.7* 4.1  HGB 7.5* 7.6*  HCT 23.6* 24.5*  PLT 85* 90*    Recent Labs Lab 07/10/15 2135  07/12/15 0325  07/13/15 0136 07/13/15 0502 07/13/15 0739  NA 133*  --  134*  --   --  136  --   K 4.9  --  4.3  --   --  4.3  --   CL 91*  --  93*  --   --  95*  --   CO2 26  --  27  --   --  26  --  BUN 33*  --  19  --   --  18  --   CREATININE 6.51*  --  4.70*  --   --  4.49*  --   CALCIUM 5.5*  < > 6.5*  < > 7.2* 7.5* 7.3*  PROT 5.2*  --  5.3*  --   --   --   --   BILITOT 0.6  --  0.7  --   --   --   --   ALKPHOS 1960*  --  1764*  --   --   --   --   ALT 12*  --  11*  --   --   --   --   AST 28  --  20  --   --   --   --   GLUCOSE 119*  --  103*  --   --  103*  --   < > = values in this interval not displayed.  PTH    Component Value Date/Time   PTH 671* 07/11/2015 1315   PTH Comment 07/10/2015 2200    Imaging/Diagnostic  Tests: EKG: NSR   Carlyle Dolly, MD 07/13/2015, 3:43 AM PGY-1, [place] Intern pager: [phone], text pages welcome

## 2015-07-13 NOTE — Progress Notes (Signed)
[facility] Progress Note   Subjective: no further twitching, Ca up to mid 7's.  Did get 2 gm IV Ca last night x 2  Filed Vitals:   07/13/15 0500 07/13/15 0756 07/13/15 0938 07/13/15 1206  BP: 133/96 120/70 120/70 108/62  Pulse: 98 96 111 100  Temp:  98.6 F (37 C)  98.4 F (36.9 C)  TempSrc:  Oral  Oral  Resp: 19 20  24   Height:      Weight:      SpO2: 99% 100%  94%   Exam: Chronically ill-appearing AAM , calm No rash, cyanosis or gangrene No jvd Chest clear bilat RRR no mrg Abd soft ntnd no mass or ascites No LE edema Neuro is alert, Ox 3, chronically weak  TTS Ashe 4h 72kg 2/2.25 Ca Heparin 5000 Hect 8 Micera   Assessment: 1 Acute hypocalcemia - resolving. No further symptoms. Ca mid 7's. Hold further IV Ca as long as Ca is over 6.5. Stopped fosrenol as binder and will use CaCO3 instead, have ordered 800 mg elem Ca tid of CaCO3.  1, 25 Vit D is being given as Hectorol (1,25 vit D analog) 8 ug tiw w HD, this is high-dose Rx. We will also use high Ca bath until unadjusted Ca is over 8 or so.  2 ESRD on HD tts 3 Vol is at dry wt 4 Metastatic cancer - bony mets, clinical dx of prostate Ca, f/b Dr Bobby Rumpf in [place] on Lupron and Casodex 5 HTN holding CCB 6 Anemia  Plan - HD Sat, high Ca bath. For pall care meeting today w family. Stable for dc from renal standpoint.     Kelly Splinter MD  pager [phone]    cell [phone]  07/13/2015, 12:53 PM     Recent Labs Lab 07/10/15 2135  07/12/15 0325  07/13/15 0136 07/13/15 0502 07/13/15 0739  NA 133*  --  134*  --   --  136  --   K 4.9  --  4.3  --   --  4.3  --   CL 91*  --  93*  --   --  95*  --   CO2 26  --  27  --   --  26  --   GLUCOSE 119*  --  103*  --   --  103*  --   BUN 33*  --  19  --   --  18  --   CREATININE 6.51*  --  4.70*  --   --  4.49*  --   CALCIUM 5.5*  < > 6.5*  < > 7.2* 7.5* 7.3*  PHOS 4.2  --  3.0  --   --   --   --   < > = values in this interval not  displayed.  Recent Labs Lab 07/10/15 2135 07/12/15 0325  AST 28 20  ALT 12* 11*  ALKPHOS 1960* 1764*  BILITOT 0.6 0.7  PROT 5.2* 5.3*  ALBUMIN 2.8* 2.6*    Recent Labs Lab 07/12/15 0325 07/12/15 1328 07/13/15 0502  WBC 3.7* 4.1 3.9*  HGB 7.5* 7.6* 8.3*  HCT 23.6* 24.5* 26.0*  MCV 97.5 97.2 97.0  PLT 85* 90* 86*   . antiseptic oral rinse  7 mL Mouth Rinse BID  . aspirin EC  81 mg Oral Daily  . bicalutamide  50 mg Oral Daily  . calcium carbonate  800 mg of elemental calcium Oral TID WC  . calcium gluconate  2 g Intravenous Once  . [START ON 07/14/2015] doxercalciferol  8 mcg Intravenous Q T,Th,Sa-HD  . gabapentin  100 mg Oral BID  . heparin  5,000 Units Subcutaneous 3 times per day  . metoprolol  50 mg Oral BID  . multivitamin  1 tablet Oral Daily  . pantoprazole  80 mg Oral Daily  . simvastatin  40 mg Oral QPM     albuterol, HYDROcodone-acetaminophen

## 2015-07-13 NOTE — Progress Notes (Signed)
FPTS Interim Progress Note  S: Met with Tim Kirby, wife, and brother, along with Palliative Care to discuss goals of care. Wife and patient had discussed amongst themselves since last admission and filled out advanced directive paperwork that I had given them. Patient wishes to designate wife Tim Kirby as [place]. Dr. Rowe Pavy went over what DNR meant in detail and answered all patient's and wife's questions. Both patient and wife voiced understanding and wish for patient to be made DNR.  We also discussed issues regarding outpatient management for [name] at discharge. Wife is understandably overwhelmed with all the medical appointments she needs to get [name] to with dialysis 3x/wk and oncology appointments. She also voiced concern that she feels that the PCP visits aren't helpful because they are often not updated on what they need to do for him, and that they go to these appointments, and all they do is a quick physical exam and then tell the patient he looks great. She is wondering if he can get follow up for his hypocalcemia at his dialysis appointments and cut down on the number of outpatient appointments he needs to go to. Patient's next outpatient oncology appointment is not until the week of 10/24 so depending on date of discharge, may need calcium check before the next scheduled oncology appointment.  O: BP 108/62 mmHg  Pulse 100  Temp(Src) 98.4 F (36.9 C) (Oral)  Resp 24  Ht _0  (1.727 m)  Wt 160 lb 0.9 oz (72.6 kg)  BMI 24.34 kg/m2  SpO2 94%   Patient resting comfortably in chair, alert without distress.  A/P: 1. Ensure that d/c summary is faxed to oncology Dr. Lavera Guise and PCP Dr. Jenean Lindau on discharge. 2. Minimize unnecessary PCP appointments if same goal can be accomplished at dialysis. Ensure that patient's hypocalcemia can be monitored adequately at OP dialysis.  Noreene Larsson, Med Student 07/13/2015, 5:20 PM  Discussed with medical student. Appreciate  her sitting in on this discussion as the other members of our team were unable to make it. A/P as noted above. We appreciate the opportunity to participate in the care of [name] who is a wonderful and kind gentleman in unfortunate circumstances.   Paula Compton, MD Family Medicine - PGY 2

## 2015-07-14 LAB — CBC
HEMATOCRIT: 26.3 % — AB (ref 39.0–52.0)
HEMOGLOBIN: 8.3 g/dL — AB (ref 13.0–17.0)
MCH: 30.6 pg (ref 26.0–34.0)
MCHC: 31.6 g/dL (ref 30.0–36.0)
MCV: 97 fL (ref 78.0–100.0)
Platelets: 98 10*3/uL — ABNORMAL LOW (ref 150–400)
RBC: 2.71 MIL/uL — ABNORMAL LOW (ref 4.22–5.81)
RDW: 20.6 % — AB (ref 11.5–15.5)
WBC: 4.6 10*3/uL (ref 4.0–10.5)

## 2015-07-14 LAB — RENAL FUNCTION PANEL
ALBUMIN: 3 g/dL — AB (ref 3.5–5.0)
ANION GAP: 14 (ref 5–15)
BUN: 28 mg/dL — AB (ref 6–20)
CHLORIDE: 96 mmol/L — AB (ref 101–111)
CO2: 26 mmol/L (ref 22–32)
Calcium: 7.3 mg/dL — ABNORMAL LOW (ref 8.9–10.3)
Creatinine, Ser: 6.21 mg/dL — ABNORMAL HIGH (ref 0.61–1.24)
GFR calc Af Amer: 9 mL/min — ABNORMAL LOW (ref >60)
GFR, EST NON AFRICAN AMERICAN: 8 mL/min — AB (ref >60)
Glucose, Bld: 108 mg/dL — ABNORMAL HIGH (ref 65–99)
PHOSPHORUS: 2.7 mg/dL (ref 2.5–4.6)
POTASSIUM: 4.9 mmol/L (ref 3.5–5.1)
Sodium: 136 mmol/L (ref 135–145)

## 2015-07-14 LAB — CALCIUM
Calcium: 6.9 mg/dL — ABNORMAL LOW (ref 8.9–10.3)
Calcium: 6.9 mg/dL — ABNORMAL LOW (ref 8.9–10.3)

## 2015-07-14 LAB — PTH-RELATED PEPTIDE

## 2015-07-14 MED ORDER — SODIUM CHLORIDE 0.9 % IV SOLN: 100.0000 mL | INTRAVENOUS | Status: DC | PRN

## 2015-07-14 MED ORDER — HEPARIN SODIUM (PORCINE) 1000 UNIT/ML DIALYSIS
5000.0000 [IU] | Freq: Once | INTRAMUSCULAR | Status: DC
  Filled 2015-07-14: qty 5

## 2015-07-14 MED ORDER — HEPARIN SODIUM (PORCINE) 1000 UNIT/ML DIALYSIS: 1000.0000 [IU] | INTRAMUSCULAR | Status: DC | PRN

## 2015-07-14 MED ORDER — ALTEPLASE 2 MG IJ SOLR
2.0000 mg | Freq: Once | INTRAMUSCULAR | Status: DC | PRN
  Filled 2015-07-14: qty 2

## 2015-07-14 MED ORDER — POLYETHYLENE GLYCOL 3350 17 G PO PACK: 17.0000 g | PACK | Freq: Every day | ORAL | Status: DC

## 2015-07-14 MED ORDER — PENTAFLUOROPROP-TETRAFLUOROETH EX AERO: 1.0000 "application " | INHALATION_SPRAY | CUTANEOUS | Status: DC | PRN

## 2015-07-14 MED ORDER — POLYETHYLENE GLYCOL 3350 17 G PO PACK
17.0000 g | PACK | Freq: Every day | ORAL | Status: DC
  Administered 2015-07-14: 17 g via ORAL
  Filled 2015-07-14 (×2): qty 1

## 2015-07-14 MED ORDER — CALCIUM CARBONATE ANTACID 500 MG PO CHEW
1000.0000 mg | CHEWABLE_TABLET | Freq: Three times a day (TID) | ORAL | Status: DC
  Administered 2015-07-14: 1000 mg via ORAL
  Filled 2015-07-14: qty 5

## 2015-07-14 MED ORDER — CALCIUM CARBONATE ANTACID 500 MG PO CHEW: 1000.0000 mg | CHEWABLE_TABLET | Freq: Three times a day (TID) | ORAL | Status: DC

## 2015-07-14 MED ORDER — LIDOCAINE-PRILOCAINE 2.5-2.5 % EX CREA
1.0000 "application " | TOPICAL_CREAM | CUTANEOUS | Status: DC | PRN
  Filled 2015-07-14: qty 5

## 2015-07-14 MED ORDER — DOXERCALCIFEROL 4 MCG/2ML IV SOLN
INTRAVENOUS | Status: AC
  Filled 2015-07-14: qty 4

## 2015-07-14 MED ORDER — LIDOCAINE HCL (PF) 1 % IJ SOLN: 5.0000 mL | INTRAMUSCULAR | Status: DC | PRN

## 2015-07-14 MED ORDER — DOCUSATE SODIUM 100 MG PO CAPS
100.0000 mg | ORAL_CAPSULE | Freq: Every day | ORAL | Status: DC
  Administered 2015-07-14: 100 mg via ORAL
  Filled 2015-07-14 (×2): qty 1

## 2015-07-14 NOTE — Progress Notes (Addendum)
[facility] Progress Note   Subjective: stable, Ca high 6's yest, 7.3 this am  Filed Vitals:   07/13/15 2100 07/13/15 2335 07/14/15 0430 07/14/15 0811  BP: 128/71 119/68 120/66 107/61  Pulse:  82 73 64  Temp:  98 F (36.7 C) 97.5 F (36.4 C) 97.6 F (36.4 C)  TempSrc:  Oral Oral Oral  Resp: 29 15 29 20   Height:      Weight:      SpO2:  97% 95% 100%   Exam: Chronically ill-appearing AAM , calm No rash, cyanosis or gangrene No jvd Chest clear bilat RRR no mrg Abd soft ntnd no mass or ascites No LE edema Neuro is alert, Ox 3, chronically weak  TTS Ashe 4h 72kg 2/2.25 Ca Heparin 5000 Hect 8 Micera   Assessment: 1 Acute hypocalcemia - resolving. Due to denosumab given for bone protection w metastatic cancer. Should not get this medication anymore, have listed as intolerance. No further symptoms. Ca low 7's. Stopped fosrenol as binder and using CaCO3 now, increase to 1000 mg elemental TID AC for now. Wean down as OP according to serum Ca levels. Check Ca frequently the next 1-2 months. 1, 25 Vit D is being given as Hectorol (1,25 vit D analog) 8 ug tiw w HD, this is high-dose Rx. We will also use high Ca bath until adjusted Ca is over 8.5 2 ESRD on HD tts 3 Vol is at dry wt 4 Metastatic cancer - bony mets, clinical dx of prostate Ca, f/b Dr Bobby Rumpf in [place] on Lupron and Casodex 5 HTN holding CCB 6 Anemia 7 Dispo - ok for dc from renal standpoint  Plan - HD today    Kelly Splinter MD  pager [phone]    cell [phone]  07/14/2015, 9:50 AM     Recent Labs Lab 07/12/15 0325  07/13/15 0502  07/13/15 1409  07/14/15 0147 07/14/15 0455 07/14/15 0741  NA 134*  --  136  --   --   --   --  136  --   K 4.3  --  4.3  --   --   --   --  4.9  --   CL 93*  --  95*  --   --   --   --  96*  --   CO2 27  --  26  --   --   --   --  26  --   GLUCOSE 103*  --  103*  --   --   --   --  108*  --   BUN 19  --  18  --   --   --   --  28*  --   CREATININE 4.70*   --  4.49*  --   --   --   --  6.21*  --   CALCIUM 6.5*  < > 7.5*  < > 7.5*  < > 6.9* 7.3* 6.9*  PHOS 3.0  --   --   --  2.4*  --   --  2.7  --   < > = values in this interval not displayed.  Recent Labs Lab 07/10/15 2135 07/12/15 0325 07/14/15 0455  AST 28 20  --   ALT 12* 11*  --   ALKPHOS 1960* 1764*  --   BILITOT 0.6 0.7  --   PROT 5.2* 5.3*  --   ALBUMIN 2.8* 2.6* 3.0*    Recent Labs Lab 07/12/15 1328 07/13/15  0502 07/14/15 0455  WBC 4.1 3.9* 4.6  HGB 7.6* 8.3* 8.3*  HCT 24.5* 26.0* 26.3*  MCV 97.2 97.0 97.0  PLT 90* 86* 98*   . antiseptic oral rinse  7 mL Mouth Rinse BID  . aspirin EC  81 mg Oral Daily  . bicalutamide  50 mg Oral Daily  . calcium carbonate  800 mg of elemental calcium Oral TID WC  . doxercalciferol  8 mcg Intravenous Q T,Th,Sa-HD  . gabapentin  100 mg Oral BID  . heparin  5,000 Units Subcutaneous 3 times per day  . metoprolol  50 mg Oral BID  . multivitamin  1 tablet Oral Daily  . pantoprazole  80 mg Oral Daily  . simvastatin  40 mg Oral QPM     albuterol, HYDROcodone-acetaminophen

## 2015-07-14 NOTE — Discharge Summary (Signed)
[facility] Discharge Summary  [name] Medical record number: [id number] Date of birth: 02-13-1941 Age: 74 y.o. Gender: male Date of Admission: 07/10/2015  Date of Discharge: 07/13/1609/05/2015  Admitting Physician: Albertine Patricia, MD  Primary Care Provider: [name], MD Consultants: Nephrology   Indication for Hospitalization: Hypocalcemia   Discharge Diagnoses/Problem List:  Patient Active Problem List   Diagnosis Date Noted  . Prostate cancer metastatic to bone ([place]) 07/10/2015  . Hypocalcemia 07/10/2015  . Chronic diastolic CHF (congestive heart failure) ([place]) 07/10/2015  . Malnutrition of moderate degree ([place]) 06/27/2015  . End stage renal disease on dialysis ([place])   . Persistent atrial fibrillation ([place])   . Metastatic bone cancer ([place])   . Encounter for palliative care   . Diastolic CHF, acute on chronic (HCC) 06/26/2015  . Dyspnea   . ESRD (end stage renal disease) ([place])   . Metastatic cancer ([place])   . Lumbago   . Weakness of both legs   . Intractable pain 06/14/2015  . Lytic bone lesions on xray 06/14/2015  . AF (paroxysmal atrial fibrillation) ([place]) 06/13/2015  . 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 Dyslipidemia 05/14/2015  . Chronic kidney disease requiring chronic dialysis ([place]) 05/14/2015  . CAD in native artery 05/14/2015  . SOB (shortness of breath) 04/27/2015  . Hypoxia 04/27/2015  . Pulmonary infiltrate 04/27/2015  . Chest pressure 04/02/2015  . Left hip pain 04/02/2015  . Acute bronchitis 01/02/2015  . Volume overload 01/02/2015  . Acute respiratory failure with hypoxia ([place]) 01/02/2015  . Aftercare following surgery of the circulatory system, [place] 04/15/2013  . Aneurysm of artery of lower extremity ([place]) 01/14/2013  . PVD (peripheral vascular disease) ([place]) 11/12/2012  . Iliac artery aneurysm, bilateral ([place]) 11/12/2012  . Peripheral vascular disease, unspecified ([place]) 10/01/2012   . End stage renal disease ([place]) 10/01/2012  . Chest pain 09/21/2012  . Essential hypertension 09/21/2012  . 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 Respiratory: CTAB Abdomen: soft, positive BS, non-tender, non-distended Extremities: Fistula on left forearm for HD  Neuro: No twitching or muscle spasms. No gross motor defecits.  Brief Hospital Course:  74 y.o. male transfer from [place] for symptomatic hypocalcemia and volume overload ESRD, in setting of metastatic prostate CA.   Hypocalcemia: Patient presented as a transfer from [facility] with severe diffuse resting tetany and positive Chvostek's sign. Of note, he was administered Denosumab 5 days prior to admission which was the suspected cause of the hypocalcemia. It was discovered that his Calcium was 5.7 at [place] and then decreased to 5 prior to [facility] admission. EKG revealed NSR. Nephrology was consulted. Patient was placed on aggressive IV calcium supplementation, Calcium gluconate 1000mg  TID, and Calcitriol 0.5 mcg daily. His calcium was checked every 2 hours and his calcium level intermittently improved. He received hemodialysis in a high calcium bath on 10/6 and 10/7. Over the course of the hospitalization, [name] received multiple doses of 2g IV calcium gluconate when his calcium dropped. Upon discharge, his twitching had completely resolved and his serum calcium was 7.3 (8.4 corrected for hypoalbuminemia).  ESRD: Patient has a history of ESRD and goes to hemodialysis three times a week. His last hemodialysis was the day prior to  admission. Nephrology was consulted. He received hemodialysis twice during hospitalization as mentioned above without complications.    Prostate Cancer: Patient has known prostate cancer with metastasis to bone. He follows with an oncologist, Dr. Bobby Rumpf, in [place] who was contacted. Casodex was continued throughout hospital course. Palliative care was consulted and discussed goals of care with patient's family which included his wife and brother. Patient designated his wife as [place]. Patient also decided to be made DNR.    A-fib: Patient has known history of A-fib. Upon admission he was in NSR on EKG. After his first hemodialysis session on 10/6, he was noted to be in a-fib on telemetry. His rate remained around 90. He is not a candidate for anticoagulation, so his rate was continuously monitored on telemetry.   Issues for Follow Up:  1. Ensure that this d/c summary is faxed to oncology Dr. Lavera Guise and PCP Dr. Jenean Lindau on discharge. 2. Minimize unnecessary PCP appointments if same goal can be accomplished at dialysis.  3. Ensure that patient's hypocalcemia can be monitored adequately at OP dialysis  Significant Procedures: Hemodialysis x 2  Significant Labs and Imaging:   Recent Labs Lab 07/12/15 1328 07/13/15 0502 07/14/15 0455  WBC 4.1 3.9* 4.6  HGB 7.6* 8.3* 8.3*  HCT 24.5* 26.0* 26.3*  PLT 90* 86* 98*    Recent Labs Lab 07/10/15 2135  07/11/15 1315  07/12/15 0325  07/13/15 0502  07/13/15 1409 07/13/15 1545 07/13/15 2028 07/13/15 2220 07/14/15 0147 07/14/15 0455  NA 133*  --   --   --  134*  --  136  --   --   --   --   --   --  136  K 4.9  --   --   --  4.3  --  4.3  --   --   --   --   --   --  4.9  CL 91*  --   --   --  93*  --  95*  --   --   --   --   --   --  96*  CO2 26  --   --   --  27  --  26  --   --   --   --   --   --  26  GLUCOSE 119*  --   --   --  103*  --  103*  --   --   --   --   --   --  108*  BUN 33*  --   --   --  19  --  18  --   --   --   --   --   --  28*  CREATININE 6.51*  --   --   --  4.70*  --  4.49*  --   --   --   --   --   --  6.21*  CALCIUM 5.5*  < >  8.4*  < > 6.5*  < > 7.5*  < > 7.5* 7.2* 7.0* 6.8* 6.9* 7.3*  MG  --   --  2.0  --  2.0  --   --   --  2.2  --   --   --   --   --   PHOS 4.2  --   --   --  3.0  --   --   --  2.4*  --   --   --   --  2.7  ALKPHOS 1960*  --   --   --  1764*  --   --   --   --   --   --   --   --   --   AST 28  --   --   --  20  --   --   --   --   --   --   --   --   --   ALT 12*  --   --   --  11*  --   --   --   --   --   --   --   --   --   ALBUMIN 2.8*  --   --   --  2.6*  --   --   --   --   --   --   --   --  3.0*  < > = values in this interval not displayed.  PTH    Component Value Date/Time   PTH 671* 07/11/2015 1315   PTH Comment 07/10/2015 2200     Results/Tests Pending at Time of Discharge: none  Discharge Medications:    Medication List    STOP taking these medications        lanthanum 1000 MG chewable tablet  Commonly known as:  FOSRENOL      TAKE these medications        Albuterol Sulfate 108 (90 BASE) MCG/ACT Aepb  Inhale 2 puffs into the lungs every 4 (four) hours as needed (wheezing).     amLODipine 5 MG tablet  Commonly known as:  NORVASC  Take 5 mg by mouth daily.     aspirin 81 MG EC tablet  Take 1 tablet (81 mg total) by mouth daily.     bicalutamide 50 MG tablet  Commonly known as:  CASODEX  Take 1 tablet (50 mg total) by mouth daily.     calcium carbonate 500 MG chewable tablet  Commonly known as:  TUMS - dosed in mg elemental calcium  Chew 5 tablets (1,000 mg of elemental calcium total) by mouth 3 (three) times daily with meals.     gabapentin 100 MG capsule  Commonly known as:  NEURONTIN  Take 1 capsule (100 mg total) by mouth 2 (two) times daily.     HYDROcodone-acetaminophen 5-325 MG tablet  Commonly known as:  NORCO/VICODIN  Take 1-2 tablets by mouth every 6 (six) hours as needed for moderate pain or severe pain.     metoprolol 50 MG tablet  Commonly known as:  LOPRESSOR  Take 1 tablet (50 mg total) by mouth 2 (two) times daily.     morphine 15  MG tablet  Commonly known as:  MSIR  Take 15 mg by mouth every 6 (six) hours as needed for moderate pain or severe pain.     multivitamin Tabs tablet  Take 1 tablet by mouth daily.     omeprazole 40 MG capsule  Commonly known as:  PRILOSEC  Take 40 mg by mouth daily.     polyethylene glycol packet  Commonly known as:  MIRALAX / GLYCOLAX  Take 17 g by mouth daily.     simvastatin 40 MG tablet  Commonly known as:  ZOCOR  Take 40 mg by mouth every evening.     tamsulosin 0.4 MG Caps capsule  Commonly known as:  FLOMAX  Take 0.4 mg by mouth at bedtime.  Discharge Instructions: Please refer to Patient Instructions section of EMR for full details.  Patient was counseled important signs and symptoms that should prompt return to medical care, changes in medications, dietary instructions, activity restrictions, and follow up appointments.   Follow-Up Appointments:   Follow-up Information    Follow up with [facility] MD. Schedule an appointment as soon as possible for a visit in 3 days.   Specialty:  Oncology   Why:  Hospital follow-up   Contact information:   [address] [place] [address] [phone]       Carlyle Dolly, MD 07/14/2015, 7:24 PM PGY-1, [place]

## 2015-07-14 NOTE — Discharge Instructions (Signed)
You were admitted for decreased levels of calcium. You will need to continue taking calcium tablets as recommended. I have also prescribed Miralax for your constipation. Please follow up with your oncologist and nephrologist to continue care for your calcium levels.   Hypocalcemia, Adult Hypocalcemia is low blood calcium. Calcium is important for cells to function in the body. Low blood calcium can cause a variety of symptoms and problems. CAUSES   Low levels of a body protein called albumin.  Problems with the parathyroid glands or surgical removal of the parathyroid glands. The parathyroid glands maintain the body's level of calcium.  Decreased production or improper use of parathyroid hormone.  Lack (deficiency) of vitamin D or magnesium or both.  Intestinal problems that interfere with nutrient absorption.  Alcoholism.  Kidney problems.  Inflammation of the pancreas (pancreatitis).  Certain medicines.  Severe infections (sepsis).  Infiltrative diseases. With these diseases the parathyroid glands are filled with cells or substances that are not normally present. Examples include:  Sarcoidosis.  Hemachromatosis.  Breakdown of large amounts of muscle fiber.  High levels of phosphate in the body.  Cancer.  Massive blood transfusions which usually occur with severe trauma. SYMPTOMS   Numbness and tingling in the fingers, toes, or around the mouth.  Muscle aches or cramps, especially in the legs, feet, and back.  Muscle twitches.  Shortness of breath or wheezing.  Difficulty swallowing.  Changes in the sound of the voice.  General weakness.  Fainting.  Fast heart beats (palpitations).  Chest pain.  Irritability.  Difficulty thinking.  Memory problems or confusion.  Severe fatigue.  Changes in personality.  Depression and anxiety.  Shaking uncontrollably (seizures).  Coarse, brittle hair and nails.  Dry skin or lasting (chronic) skin diseases  (psoriasis, eczema, or dermatitis).  Clouding of the eye lens (cataracts).  Abdominal cramping or pain. DIAGNOSIS  Hypocalcemia is usually diagnosed through blood tests that reveal a low level of blood calcium. Other tests, such as a recording of the electrical activity of the heart (electrocardiogram, EKG), may be performed in order to diagnose the underlying cause of the condition. TREATMENT  Treatment for hypocalcemia includes giving calcium supplements. These can be given by mouth or by intravenous (IV) access tube, depending on the severity of the symptoms and deficiency. Other minerals (electrolytes), such as magnesium, may also be given. HOME CARE INSTRUCTIONS   Meet with a dietitian to make sure you are eating the most healthful diet possible, or follow diet instructions as directed by your caregiver.  Follow up with your caregiver as directed. SEEK IMMEDIATE MEDICAL CARE IF:   You develop chest pain.  You develop persistent rapid or irregular heartbeats.  You have difficulty breathing.  You faint.  You develop increased fatigue.  You have new swelling in the feet, ankles, or legs.  You develop increased muscle twitching.  You start to have seizures.  You develop confusion.  You develop mood, memory, or personality changes. MAKE SURE YOU:   Understand these instructions.  Will watch your condition.  Will get help right away if you are not doing well or get worse.   This information is not intended to replace advice given to you by your health care provider. Make sure you discuss any questions you have with your health care provider.   Document Released: 03/12/2010 Document Revised: 12/15/2011 Document Reviewed: 02/07/2015 Elsevier Interactive Patient Education [employer].

## 2015-07-14 NOTE — Progress Notes (Signed)
Family Medicine Teaching Service Daily Progress Note Intern Pager: [phone]  [name] Medical record number: [id number] Date of birth: 1940-10-25 Age: 74 y.o. Gender: male  Primary Care Provider: Helen Kirby., MD Consultants: nephrology  Code Status: Full (confirmed on admission)  Pt Overview and Major Events to Date:  10/5: Transferred to [facility] from [place]. HD   10/6: HD 10/7: Hypocalcemia improving. Meeting with Palliative Care  Assessment and Plan: 74 y.o. male transfer from [place] for symptomatic hypocalcemia and volume overload ESRD, in setting of metastatic prostate CA.   Hypocalcemia: Corrected Calcium 8.1 this AM, slightly below normal range. Phosphorus 2.7. Curerntly no tetany or chvostek's sign. Hypocalcemia due secondary to denosumab admin. Received aggressive IV calcium supplementation. EKG with normal sinus rhythm. ALP 1960>>1764  - Nephrology consulted - PO Calcium carbonate 1000mg  TID - Calcitriol 0.5 mcg daily - Check Ca q3h - Discharge today with home health  ESRD: HD T/Th/Sa. Received HD with Ca bath on 10/5, 10/6 and 10/8. - f/u with Nephrology    Cardiac: BP stable. Patient does have some bilateral LE edema on exam - Hold amlodipine   Prostate Cancer. Metastatic; bony metastasis. Follows with Dr. Bobby Kirby in [place]. Dr. Bobby Kirby was called yesterday.  - Lupron and Bicalutamide - Palliative care consulted, patient DNR/DNI  A-fib; Chronic. Not a candidate for anticoagulation. Stable rate - Continue to monitor on telemetry   Anemia: Stable. Hgb 8.3 - Daily CBC. Low threshold for transfusion   FEN/GI: renal diet  PPx: Heparin  Disposition: home pending improvement of hypocalcemia  Subjective:  Patient doing well today. Has been constipated over the past few days, so provided miralax for bowel movement.   Objective: Temp:  [97 F (36.1 C)-98 F (36.7 C)] 97 F (36.1 C) (10/08 1118) Pulse Rate:  [64-91] 73 (10/08  1153) Resp:  [15-29] 23 (10/08 1118) BP: (107-140)/(61-83) 127/82 mmHg (10/08 1153) SpO2:  [95 %-100 %] 100 % (10/08 1118) Weight:  [167 lb 1.7 oz (75.8 kg)] 167 lb 1.7 oz (75.8 kg) (10/08 1118) Physical Exam: General: AO x3, pleasant Cardiovascular: RRR, normal s1 and s2, no rubs gallops or murmurs. Respiratory: CTAB Abdomen: soft, positive BS, non-tender, non-distended Extremities: Fistula on left forearm for HD  Neuro: No twitching or muscle spasms. No gross motor defecits.    Laboratory:  Recent Labs Lab 07/12/15 1328 07/13/15 0502 07/14/15 0455  WBC 4.1 3.9* 4.6  HGB 7.6* 8.3* 8.3*  HCT 24.5* 26.0* 26.3*  PLT 90* 86* 98*    Recent Labs Lab 07/10/15 2135  07/12/15 0325  07/13/15 0502  07/14/15 0147 07/14/15 0455 07/14/15 0741  NA 133*  --  134*  --  136  --   --  136  --   K 4.9  --  4.3  --  4.3  --   --  4.9  --   CL 91*  --  93*  --  95*  --   --  96*  --   CO2 26  --  27  --  26  --   --  26  --   BUN 33*  --  19  --  18  --   --  28*  --   CREATININE 6.51*  --  4.70*  --  4.49*  --   --  6.21*  --   CALCIUM 5.5*  < > 6.5*  < > 7.5*  < > 6.9* 7.3* 6.9*  PROT 5.2*  --  5.3*  --   --   --   --   --   --  BILITOT 0.6  --  0.7  --   --   --   --   --   --   ALKPHOS 1960*  --  1764*  --   --   --   --   --   --   ALT 12*  --  11*  --   --   --   --   --   --   AST 28  --  20  --   --   --   --   --   --   GLUCOSE 119*  --  103*  --  103*  --   --  108*  --   < > = values in this interval not displayed.  PTH    Component Value Date/Time   PTH 671* 07/11/2015 1315   PTH Comment 07/10/2015 2200    Imaging/Diagnostic Tests: EKG: NSR   Tim Cletis Media, MD 07/14/2015, 1:06 PM PGY-1, [place] Intern pager: [phone], text pages welcome

## 2015-07-15 LAB — VITAMIN D 1,25 DIHYDROXY
VITAMIN D 1, 25 (OH) TOTAL: 146 pg/mL
VITAMIN D2 1, 25 (OH): 71 pg/mL
VITAMIN D3 1, 25 (OH): 75 pg/mL

## 2015-07-24 ENCOUNTER — Emergency Department (HOSPITAL_COMMUNITY): Payer: Medicare Other

## 2015-07-24 ENCOUNTER — Emergency Department (HOSPITAL_COMMUNITY)
Admission: EM | Admit: 2015-07-24 | Discharge: 2015-07-24 | Disposition: A | Discharge status: Home / Self Care | Payer: Medicare Other | Attending: Emergency Medicine | Admitting: Emergency Medicine

## 2015-07-24 ENCOUNTER — Encounter (HOSPITAL_COMMUNITY): Payer: Self-pay

## 2015-07-24 DIAGNOSIS — Z79899 Other long term (current) drug therapy: Secondary | ICD-10-CM | POA: Diagnosis not present

## 2015-07-24 DIAGNOSIS — I509 Heart failure, unspecified: Secondary | ICD-10-CM | POA: Diagnosis not present

## 2015-07-24 DIAGNOSIS — Z87891 Personal history of nicotine dependence: Secondary | ICD-10-CM | POA: Diagnosis not present

## 2015-07-24 DIAGNOSIS — Z862 Personal history of diseases of the blood and blood-forming organs and certain disorders involving the immune mechanism: Secondary | ICD-10-CM | POA: Insufficient documentation

## 2015-07-24 DIAGNOSIS — N186 End stage renal disease: Secondary | ICD-10-CM | POA: Diagnosis not present

## 2015-07-24 DIAGNOSIS — Z992 Dependence on renal dialysis: Secondary | ICD-10-CM | POA: Insufficient documentation

## 2015-07-24 DIAGNOSIS — I209 Angina pectoris, unspecified: Secondary | ICD-10-CM | POA: Insufficient documentation

## 2015-07-24 DIAGNOSIS — E785 Hyperlipidemia, unspecified: Secondary | ICD-10-CM | POA: Insufficient documentation

## 2015-07-24 DIAGNOSIS — J810 Acute pulmonary edema: Secondary | ICD-10-CM | POA: Insufficient documentation

## 2015-07-24 DIAGNOSIS — Z8719 Personal history of other diseases of the digestive system: Secondary | ICD-10-CM | POA: Diagnosis not present

## 2015-07-24 DIAGNOSIS — R531 Weakness: Secondary | ICD-10-CM | POA: Diagnosis present

## 2015-07-24 DIAGNOSIS — Z8739 Personal history of other diseases of the musculoskeletal system and connective tissue: Secondary | ICD-10-CM | POA: Diagnosis not present

## 2015-07-24 DIAGNOSIS — I120 Hypertensive chronic kidney disease with stage 5 chronic kidney disease or end stage renal disease: Secondary | ICD-10-CM | POA: Diagnosis not present

## 2015-07-24 DIAGNOSIS — Z7982 Long term (current) use of aspirin: Secondary | ICD-10-CM | POA: Diagnosis not present

## 2015-07-24 LAB — CBC WITH DIFFERENTIAL/PLATELET
BASOS PCT: 0 %
Basophils Absolute: 0 10*3/uL (ref 0.0–0.1)
Eosinophils Absolute: 0 10*3/uL (ref 0.0–0.7)
Eosinophils Relative: 1 %
HEMATOCRIT: 25.1 % — AB (ref 39.0–52.0)
HEMOGLOBIN: 7.9 g/dL — AB (ref 13.0–17.0)
LYMPHS ABS: 1.6 10*3/uL (ref 0.7–4.0)
LYMPHS PCT: 33 %
MCH: 30.9 pg (ref 26.0–34.0)
MCHC: 31.5 g/dL (ref 30.0–36.0)
MCV: 98 fL (ref 78.0–100.0)
MONOS PCT: 7 %
Monocytes Absolute: 0.3 10*3/uL (ref 0.1–1.0)
NEUTROS ABS: 2.9 10*3/uL (ref 1.7–7.7)
NEUTROS PCT: 59 %
Platelets: 98 10*3/uL — ABNORMAL LOW (ref 150–400)
RBC: 2.56 MIL/uL — ABNORMAL LOW (ref 4.22–5.81)
RDW: 19.4 % — ABNORMAL HIGH (ref 11.5–15.5)
WBC: 4.9 10*3/uL (ref 4.0–10.5)

## 2015-07-24 LAB — URINALYSIS, ROUTINE W REFLEX MICROSCOPIC
BILIRUBIN URINE: NEGATIVE
Glucose, UA: NEGATIVE mg/dL
Hgb urine dipstick: NEGATIVE
KETONES UR: NEGATIVE mg/dL
Leukocytes, UA: NEGATIVE
NITRITE: NEGATIVE
PROTEIN: 100 mg/dL — AB
SPECIFIC GRAVITY, URINE: 1.011 (ref 1.005–1.030)
UROBILINOGEN UA: 0.2 mg/dL (ref 0.0–1.0)
pH: 8.5 — ABNORMAL HIGH (ref 5.0–8.0)

## 2015-07-24 LAB — COMPREHENSIVE METABOLIC PANEL
ALBUMIN: 2.9 g/dL — AB (ref 3.5–5.0)
ALK PHOS: 1839 U/L — AB (ref 38–126)
ALT: 9 U/L — ABNORMAL LOW (ref 17–63)
ANION GAP: 13 (ref 5–15)
AST: 17 U/L (ref 15–41)
BILIRUBIN TOTAL: 0.7 mg/dL (ref 0.3–1.2)
BUN: 49 mg/dL — AB (ref 6–20)
CALCIUM: 8 mg/dL — AB (ref 8.9–10.3)
CO2: 24 mmol/L (ref 22–32)
Chloride: 97 mmol/L — ABNORMAL LOW (ref 101–111)
Creatinine, Ser: 7.61 mg/dL — ABNORMAL HIGH (ref 0.61–1.24)
GFR calc Af Amer: 7 mL/min — ABNORMAL LOW (ref >60)
GFR calc non Af Amer: 6 mL/min — ABNORMAL LOW (ref >60)
GLUCOSE: 98 mg/dL (ref 65–99)
Potassium: 4.6 mmol/L (ref 3.5–5.1)
Sodium: 134 mmol/L — ABNORMAL LOW (ref 135–145)
TOTAL PROTEIN: 5.5 g/dL — AB (ref 6.5–8.1)

## 2015-07-24 LAB — URINE MICROSCOPIC-ADD ON

## 2015-07-24 LAB — I-STAT TROPONIN, ED: Troponin i, poc: 0.01 ng/mL (ref 0.00–0.08)

## 2015-07-24 LAB — I-STAT CG4 LACTIC ACID, ED: LACTIC ACID, VENOUS: 0.55 mmol/L (ref 0.5–2.0)

## 2015-07-24 NOTE — ED Notes (Signed)
Patient reports generalized weakness and facial twitching began sometime overnight. Patient alert and oriented. No neurological deficits noted. Patient continues to have twitching of the face.

## 2015-07-24 NOTE — ED Notes (Signed)
Pt is due for dialysis today.

## 2015-07-24 NOTE — Discharge Instructions (Signed)
Please report to dialysis today!!!! Return if worsening symptoms.   Weakness Weakness is a lack of strength. It may be felt all over the body (generalized) or in one specific part of the body (focal). Some causes of weakness can be serious. You may need further medical evaluation, especially if you are elderly or you have a history of immunosuppression (such as chemotherapy or HIV), kidney disease, heart disease, or diabetes. CAUSES  Weakness can be caused by many different things, including:  Infection.  Physical exhaustion.  Internal bleeding or other blood loss that results in a lack of red blood cells (anemia).  Dehydration. This cause is more common in elderly people.  Side effects or electrolyte abnormalities from medicines, such as pain medicines or sedatives.  Emotional distress, anxiety, or depression.  Circulation problems, especially severe peripheral arterial disease.  Heart disease, such as rapid atrial fibrillation, bradycardia, or heart failure.  Nervous system disorders, such as Guillain-Barr syndrome, multiple sclerosis, or stroke. DIAGNOSIS  To find the cause of your weakness, your caregiver will take your history and perform a physical exam. Lab tests or X-rays may also be ordered, if needed. TREATMENT  Treatment of weakness depends on the cause of your symptoms and can vary greatly. HOME CARE INSTRUCTIONS   Rest as needed.  Eat a well-balanced diet.  Try to get some exercise every day.  Only take over-the-counter or prescription medicines as directed by your caregiver. SEEK MEDICAL CARE IF:   Your weakness seems to be getting worse or spreads to other parts of your body.  You develop new aches or pains. SEEK IMMEDIATE MEDICAL CARE IF:   You cannot perform your normal daily activities, such as getting dressed and feeding yourself.  You cannot walk up and down stairs, or you feel exhausted when you do so.  You have shortness of breath or chest  pain.  You have difficulty moving parts of your body.  You have weakness in only one area of the body or on only one side of the body.  You have a fever.  You have trouble speaking or swallowing.  You cannot control your bladder or bowel movements.  You have black or bloody vomit or stools. MAKE SURE YOU:  Understand these instructions.  Will watch your condition.  Will get help right away if you are not doing well or get worse.   This information is not intended to replace advice given to you by your health care provider. Make sure you discuss any questions you have with your health care provider.   Document Released: 09/22/2005 Document Revised: 03/23/2012 Document Reviewed: 11/21/2011 Elsevier Interactive Patient Education [employer].

## 2015-07-24 NOTE — ED Provider Notes (Signed)
CSN: [id number]     Arrival date & time 07/24/15  3267 History   First MD Initiated Contact with Patient 07/24/15 [phone]     Chief Complaint  Patient presents with  . Weakness    HPI   Tim Kirby is a 74 y.o. male with complex medical history including ESRD, CHF, paroxysmal afib, and metastatic prostate cancer who is BIBEMS for evaluation of bilateral cheek twitching and generalized muscle weakness. Patient reports he woke up at about 3 AM with face twitching and feeling weak all over.  He was admitted to [facility] 10/4-10/8 for hypocalcemia and states his current symptoms feel like they did prior to that admission. States his face has been twitching and the twitches have progressively been getting more severe but denies tetany or muscle spasms anywhere else at this time. States he has not tried anything this AM for his symptoms. He reports he has been taking 5 tabs of TUMS TID since his last discharge. Denies fever, chills, N/V/D, chest pain, SOB. Denies numbness, tingling.  He is due for dialysis today.   Past Medical History  Diagnosis Date  . Hyperlipidemia   . Gout   . Secondary hyperparathyroidism ([place])   . Anemia   . Colon, diverticulosis   . Thyroid nodule   . Anginal pain ([place])     sees Dr. Lia Foyer  . Hypertension     sees Dr. Willy Eddy  . 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 Upper respiratory infection April 2016  . CHF (congestive heart failure) ([place])   . 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 ESRD (end stage renal disease) on dialysis ([place])     "TTS; [place]" (06/15/2015)  . Renal insufficiency    Past Surgical History  Procedure Laterality Date  . Knee surgery Left     cyst removal by Dr. Lorin Mercy  . Av fistula placement Left     left UA AVF  . Femoral-popliteal bypass graft  10/18/2012    Procedure: BYPASS GRAFT FEMORAL-POPLITEAL ARTERY;  Surgeon: Conrad [place], MD;  Location: [facility] OR;  Service: Vascular;  Laterality: Right;  Right Popliteal Aneurysm Exclusion; Ultrasound guided   . False aneurysm repair Left 12/29/2012    Procedure: REPAIR FALSE ANEURYSM;  Surgeon: Conrad [place], MD;  Location: [place];  Service: Vascular;  Laterality: Left;  . Endovascular repair of popliteal artery aneurysm Left 12/29/2012    Procedure: ENDOVASCULAR REPAIR OF POPLITEAL AND FEMORAL ARTERY ANEURYSM;  Surgeon: Conrad Zumbrota, MD;  Location: [place];  Service: Vascular;  Laterality: Left;  . Abdominal aortagram N/A 09/27/2012    Procedure: ABDOMINAL Maxcine Ham;  Surgeon: Angelia Mould, MD;  Location: [facility] CATH LAB;  Service: Cardiovascular;  Laterality: N/A;  . Tonsillectomy and adenoidectomy     Family History  Problem Relation Age of Onset  . Hypertension Mother   . Diabetes Mother   . Diabetes Brother    Social History  Substance Use Topics  . Smoking status: Former Smoker -- 50 years    Types: Pipe, Cigars, Cigarettes    Quit date: 09/18/2007  . Smokeless tobacco: Never Used     Comment: "mostly smoked cigars and pipes"  . Alcohol Use: Yes     Comment: "stopped drinking in ~ 2008"    Review of Systems  All other systems reviewed and are negative.     Allergies  Denosumab  Home Medications   Prior to Admission medications   Medication Sig Start Date End Date Taking?  Authorizing Provider  Albuterol Sulfate 108 (90 BASE) MCG/ACT AEPB Inhale 2 puffs into the lungs every 4 (four) hours as needed (wheezing).     Historical Provider, MD  amLODipine (NORVASC) 5 MG tablet Take 5 mg by mouth daily.  02/01/15   Historical Provider, MD  aspirin EC 81 MG EC tablet Take 1 tablet (81 mg total) by mouth daily. 06/28/15   Patrecia Pour, MD  bicalutamide (CASODEX) 50 MG tablet Take 1 tablet (50 mg total) by mouth daily. 06/16/15   Milagros Loll, MD  calcium carbonate (TUMS - DOSED IN MG ELEMENTAL CALCIUM) 500 MG chewable tablet Chew 5 tablets (1,000 mg of elemental calcium total) by mouth 3 (three) times daily with meals. 07/14/15   Asiyah Cletis Media, MD  gabapentin (NEURONTIN) 100  MG capsule Take 1 capsule (100 mg total) by mouth 2 (two) times daily. 06/28/15   Patrecia Pour, MD  HYDROcodone-acetaminophen (NORCO/VICODIN) 5-325 MG per tablet Take 1-2 tablets by mouth every 6 (six) hours as needed for moderate pain or severe pain. 06/17/15   Milagros Loll, MD  metoprolol (LOPRESSOR) 50 MG tablet Take 1 tablet (50 mg total) by mouth 2 (two) times daily. 06/28/15   Patrecia Pour, MD  morphine (MSIR) 15 MG tablet Take 15 mg by mouth every 6 (six) hours as needed for moderate pain or severe pain.  07/06/15   Historical Provider, MD  multivitamin (RENA-VIT) TABS tablet Take 1 tablet by mouth daily.    Historical Provider, MD  omeprazole (PRILOSEC) 40 MG capsule Take 40 mg by mouth daily.    Historical Provider, MD  polyethylene glycol (MIRALAX / GLYCOLAX) packet Take 17 g by mouth daily. 07/14/15   Asiyah Cletis Media, MD  simvastatin (ZOCOR) 40 MG tablet Take 40 mg by mouth every evening.    Historical Provider, MD  tamsulosin (FLOMAX) 0.4 MG CAPS capsule Take 0.4 mg by mouth at bedtime. 07/09/15   Historical Provider, MD   BP 122/60 mmHg  Pulse 74  Temp(Src) 98.2 F (36.8 C) (Oral)  Resp 27  Ht '5\' 8"'  (1.727 m)  Wt 160 lb (72.576 kg)  BMI 24.33 kg/m2  SpO2 95% Physical Exam  Constitutional: He is oriented to person, place, and time.  Appears chronically ill. Bilateral facial twitching obvious at rest.   HENT:  Right Ear: External ear normal.  Left Ear: External ear normal.  Nose: Nose normal.  Mouth/Throat: Oropharynx is clear and moist. No oropharyngeal exudate.  Eyes: Conjunctivae and EOM are normal. Pupils are equal, round, and reactive to light.  Neck: Normal range of motion. Neck supple. No tracheal deviation present.  Cardiovascular: Normal rate, regular rhythm, normal heart sounds and intact distal pulses.   No murmur heard. Pulmonary/Chest: Breath sounds normal. No respiratory distress. He has no wheezes.  Some increased WOB but pt states this is his baseline.   Abdominal: Soft. Bowel sounds are normal. He exhibits no distension. There is no tenderness. There is no rebound and no guarding.  Musculoskeletal: He exhibits no edema.  Bilateral facial twitching/chvostek's sign without provocation. Trousseau positive on right. Can only hold upper and lower extremities raised for a couple seconds but intact strength against resistance.  Lymphadenopathy:    He has no cervical adenopathy.  Neurological: He is alert and oriented to person, place, and time. No cranial nerve deficit.  Skin: Skin is warm and dry.  Psychiatric: He has a normal mood and affect.  Nursing note and vitals reviewed.  ED Course  Procedures (including critical care time) Labs Review Labs Reviewed  CBC WITH DIFFERENTIAL/PLATELET - Abnormal; Notable for the following:    RBC 2.56 (*)    Hemoglobin 7.9 (*)    HCT 25.1 (*)    RDW 19.4 (*)    Platelets 98 (*)    All other components within normal limits  COMPREHENSIVE METABOLIC PANEL - Abnormal; Notable for the following:    Sodium 134 (*)    Chloride 97 (*)    BUN 49 (*)    Creatinine, Ser 7.61 (*)    Calcium 8.0 (*)    Total Protein 5.5 (*)    Albumin 2.9 (*)    ALT 9 (*)    Alkaline Phosphatase 1839 (*)    GFR calc non Af Amer 6 (*)    GFR calc Af Amer 7 (*)    All other components within normal limits  URINALYSIS, ROUTINE W REFLEX MICROSCOPIC (NOT AT [facility]) - Abnormal; Notable for the following:    pH 8.5 (*)    Protein, ur 100 (*)    All other components within normal limits  URINE MICROSCOPIC-ADD ON  I-STAT TROPOININ, ED  I-STAT CG4 LACTIC ACID, ED    Imaging Review Dg Chest 2 View  07/24/2015  CLINICAL DATA:  Generalized weakness. EXAM: CHEST  2 VIEW COMPARISON:  July 10, 2015. FINDINGS: Stable cardiomegaly. Increased diffuse interstitial densities are noted in both lungs consistent with pulmonary edema. No pneumothorax or pleural effusion is noted. Sclerotic densities are again noted throughout the skeleton  consistent with osseous metastases. IMPRESSION: Diffuse osseous metastases are again noted. Cardiomegaly is noted with increased bilateral pulmonary edema compared to prior exam. Electronically Signed   By: Marijo Conception, M.D.   On: 07/24/2015 09:09   I have personally reviewed and evaluated these images and lab results as part of my medical decision-making.   EKG Interpretation   Date/Time:  Tuesday July 24 2015 06:54:38 EDT Ventricular Rate:  75 PR Interval:  172 QRS Duration: 84 QT Interval:  405 QTC Calculation: 452 R Axis:   26 Text Interpretation:  Sinus rhythm Atrial premature complex Borderline T  wave abnormalities Confirmed by HORTON  MD, COURTNEY ([id number]) on 07/24/2015  7:02:33 AM      MDM   Final diagnoses:  None    7:45AM  Given patient's endorsement of symptom similarity to prior admission, ESRD and malignancy history, nonfocal neuro exam, and obvious chvostek's sign, suspicion high for hypocalcemia despite TUMS supplementation. Will check CMP to get electrolyte levels. EKG unremarkable compared to baseline/prior EKGs. If labs are normal, will expand workup, though suspicion is low for TIA/stroke, ACS. Will hold off on fluids for now until labs result.  8:00AM CBC reveals anemia consistent with pt's baseline values.   8:40AM CMP reveals Ca 8.0 which should not be low enough to cause pt's weakness and twitching. Will expand labs to look for other etiologies of weakness/SOB/twitching including CXR, lactate, troponin, UA.   10:00AM Other labs unrevealing. Alk phos elevation is chronic. CXR does reveal some increased pulmonary edema which could be causing pt's sensation of weakness and perhaps diluting pt's calcium. Will likely have pt continue with dialysis today as scheduled to remove fluid with f/u with oncology, PCP, and renal.   Spoke to pts wife. She will come pick him up and drive to [facility] for HD.   Anne Ng, PA-C 07/24/15 1128  Noemi Chapel,  MD 07/28/15 [phone]

## 2015-07-24 NOTE — ED Notes (Signed)
EMS stroke screen was negative

## 2015-07-24 NOTE — ED Notes (Signed)
Patient transported to X-ray 

## 2015-07-24 NOTE — ED Notes (Signed)
Per [facility]: Pt woke up around 3 am and felt weakness all over and was having twitching in his face. Pt states that the last time that this happened he was severely hypocalcemic.

## 2015-08-03 DIAGNOSIS — E291 Testicular hypofunction: Secondary | ICD-10-CM | POA: Diagnosis not present

## 2015-08-03 DIAGNOSIS — C7951 Secondary malignant neoplasm of bone: Secondary | ICD-10-CM | POA: Diagnosis not present

## 2015-08-03 DIAGNOSIS — C61 Malignant neoplasm of prostate: Secondary | ICD-10-CM | POA: Diagnosis not present

## 2015-08-07 ENCOUNTER — Encounter: Payer: Self-pay | Admitting: Family

## 2015-08-10 ENCOUNTER — Ambulatory Visit: Payer: Medicare Other | Admitting: Family

## 2015-08-10 ENCOUNTER — Ambulatory Visit (HOSPITAL_COMMUNITY)
Admission: RE | Admit: 2015-08-10 | Discharge: 2015-08-10 | Disposition: A | Discharge status: Home / Self Care | Payer: Medicare Other | Source: Ambulatory Visit | Attending: Family | Admitting: Family

## 2015-08-10 ENCOUNTER — Other Ambulatory Visit: Payer: Self-pay | Admitting: Vascular Surgery

## 2015-08-10 DIAGNOSIS — I739 Peripheral vascular disease, unspecified: Secondary | ICD-10-CM

## 2015-08-10 DIAGNOSIS — I723 Aneurysm of iliac artery: Secondary | ICD-10-CM

## 2015-08-10 DIAGNOSIS — Z48812 Encounter for surgical aftercare following surgery on the circulatory system: Secondary | ICD-10-CM

## 2015-08-10 DIAGNOSIS — Z95828 Presence of other vascular implants and grafts: Secondary | ICD-10-CM

## 2015-08-10 DIAGNOSIS — Z9889 Other specified postprocedural states: Secondary | ICD-10-CM

## 2015-08-12 ENCOUNTER — Other Ambulatory Visit: Payer: Self-pay | Admitting: Family Medicine

## 2015-09-03 ENCOUNTER — Encounter: Payer: Self-pay | Admitting: Vascular Surgery

## 2015-09-05 ENCOUNTER — Other Ambulatory Visit: Payer: Self-pay | Admitting: Vascular Surgery

## 2015-09-05 ENCOUNTER — Ambulatory Visit (HOSPITAL_COMMUNITY)
Admission: RE | Admit: 2015-09-05 | Discharge: 2015-09-05 | Disposition: A | Discharge status: Home / Self Care | Payer: Medicare Other | Source: Ambulatory Visit | Attending: Vascular Surgery | Admitting: Vascular Surgery

## 2015-09-05 ENCOUNTER — Other Ambulatory Visit: Payer: Self-pay | Admitting: *Deleted

## 2015-09-05 ENCOUNTER — Encounter (HOSPITAL_COMMUNITY): Payer: Self-pay

## 2015-09-05 ENCOUNTER — Ambulatory Visit: Payer: Medicare Other | Admitting: Vascular Surgery

## 2015-09-05 DIAGNOSIS — I723 Aneurysm of iliac artery: Secondary | ICD-10-CM | POA: Insufficient documentation

## 2015-09-05 HISTORY — DX: Malignant (primary) neoplasm, unspecified: C80.1

## 2015-09-05 LAB — POC BUN/CREATININE
BUN, ISTAT: 18 mg/dL (ref 8–26)
Creatinine, IStat: 5.3 mg/dL — ABNORMAL HIGH (ref 0.6–1.3)

## 2015-09-05 MED ORDER — IOHEXOL 300 MG/ML  SOLN
100.0000 mL | Freq: Once | INTRAMUSCULAR | Status: AC | PRN
  Administered 2015-09-05: 100 mL via INTRAVENOUS

## 2015-09-07 DIAGNOSIS — C61 Malignant neoplasm of prostate: Secondary | ICD-10-CM | POA: Diagnosis not present

## 2015-09-07 DIAGNOSIS — C7951 Secondary malignant neoplasm of bone: Secondary | ICD-10-CM | POA: Diagnosis not present

## 2015-09-19 ENCOUNTER — Ambulatory Visit: Payer: Medicare Other | Admitting: Vascular Surgery

## 2015-09-26 ENCOUNTER — Encounter: Payer: Self-pay | Admitting: Vascular Surgery

## 2015-10-02 ENCOUNTER — Encounter (HOSPITAL_COMMUNITY): Payer: Self-pay | Admitting: *Deleted

## 2015-10-02 ENCOUNTER — Emergency Department (HOSPITAL_COMMUNITY): Payer: Medicare Other

## 2015-10-02 ENCOUNTER — Inpatient Hospital Stay (HOSPITAL_COMMUNITY)
Admission: EM | Admit: 2015-10-02 | Discharge: 2015-10-05 | DRG: 091 | Disposition: A | Discharge status: Home Health | Payer: Medicare Other | Attending: Oncology | Admitting: Oncology

## 2015-10-02 ENCOUNTER — Inpatient Hospital Stay (HOSPITAL_COMMUNITY): Payer: Medicare Other

## 2015-10-02 DIAGNOSIS — C7951 Secondary malignant neoplasm of bone: Secondary | ICD-10-CM | POA: Diagnosis present

## 2015-10-02 DIAGNOSIS — D649 Anemia, unspecified: Secondary | ICD-10-CM | POA: Diagnosis present

## 2015-10-02 DIAGNOSIS — G92 Toxic encephalopathy: Secondary | ICD-10-CM | POA: Diagnosis not present

## 2015-10-02 DIAGNOSIS — Z66 Do not resuscitate: Secondary | ICD-10-CM | POA: Diagnosis present

## 2015-10-02 DIAGNOSIS — I480 Paroxysmal atrial fibrillation: Secondary | ICD-10-CM | POA: Diagnosis present

## 2015-10-02 DIAGNOSIS — Z7982 Long term (current) use of aspirin: Secondary | ICD-10-CM

## 2015-10-02 DIAGNOSIS — T402X5A Adverse effect of other opioids, initial encounter: Secondary | ICD-10-CM | POA: Diagnosis present

## 2015-10-02 DIAGNOSIS — I10 Essential (primary) hypertension: Secondary | ICD-10-CM | POA: Diagnosis present

## 2015-10-02 DIAGNOSIS — C61 Malignant neoplasm of prostate: Secondary | ICD-10-CM | POA: Diagnosis present

## 2015-10-02 DIAGNOSIS — I132 Hypertensive heart and chronic kidney disease with heart failure and with stage 5 chronic kidney disease, or end stage renal disease: Secondary | ICD-10-CM | POA: Diagnosis present

## 2015-10-02 DIAGNOSIS — I6200 Nontraumatic subdural hemorrhage, unspecified: Secondary | ICD-10-CM | POA: Diagnosis present

## 2015-10-02 DIAGNOSIS — E785 Hyperlipidemia, unspecified: Secondary | ICD-10-CM | POA: Diagnosis present

## 2015-10-02 DIAGNOSIS — Z79899 Other long term (current) drug therapy: Secondary | ICD-10-CM | POA: Diagnosis not present

## 2015-10-02 DIAGNOSIS — N2581 Secondary hyperparathyroidism of renal origin: Secondary | ICD-10-CM | POA: Diagnosis present

## 2015-10-02 DIAGNOSIS — N186 End stage renal disease: Secondary | ICD-10-CM | POA: Diagnosis present

## 2015-10-02 DIAGNOSIS — I509 Heart failure, unspecified: Secondary | ICD-10-CM | POA: Diagnosis present

## 2015-10-02 DIAGNOSIS — R4182 Altered mental status, unspecified: Secondary | ICD-10-CM | POA: Diagnosis present

## 2015-10-02 DIAGNOSIS — G9340 Encephalopathy, unspecified: Secondary | ICD-10-CM | POA: Diagnosis present

## 2015-10-02 DIAGNOSIS — Z992 Dependence on renal dialysis: Secondary | ICD-10-CM | POA: Diagnosis not present

## 2015-10-02 DIAGNOSIS — Z87891 Personal history of nicotine dependence: Secondary | ICD-10-CM

## 2015-10-02 DIAGNOSIS — K219 Gastro-esophageal reflux disease without esophagitis: Secondary | ICD-10-CM | POA: Diagnosis present

## 2015-10-02 DIAGNOSIS — R29898 Other symptoms and signs involving the musculoskeletal system: Secondary | ICD-10-CM | POA: Diagnosis present

## 2015-10-02 DIAGNOSIS — M109 Gout, unspecified: Secondary | ICD-10-CM | POA: Diagnosis present

## 2015-10-02 LAB — BRAIN NATRIURETIC PEPTIDE: B Natriuretic Peptide: 3380 pg/mL — ABNORMAL HIGH (ref 0.0–100.0)

## 2015-10-02 LAB — COMPREHENSIVE METABOLIC PANEL
ALBUMIN: 3.6 g/dL (ref 3.5–5.0)
ALK PHOS: 5968 U/L — AB (ref 38–126)
ALT: 8 U/L — AB (ref 17–63)
AST: 21 U/L (ref 15–41)
Anion gap: 16 — ABNORMAL HIGH (ref 5–15)
BILIRUBIN TOTAL: 1 mg/dL (ref 0.3–1.2)
BUN: 60 mg/dL — AB (ref 6–20)
CALCIUM: 10.6 mg/dL — AB (ref 8.9–10.3)
CO2: 30 mmol/L (ref 22–32)
CREATININE: 9.48 mg/dL — AB (ref 0.61–1.24)
Chloride: 95 mmol/L — ABNORMAL LOW (ref 101–111)
GFR calc Af Amer: 6 mL/min — ABNORMAL LOW (ref >60)
GFR, EST NON AFRICAN AMERICAN: 5 mL/min — AB (ref >60)
GLUCOSE: 101 mg/dL — AB (ref 65–99)
POTASSIUM: 5.3 mmol/L — AB (ref 3.5–5.1)
Sodium: 141 mmol/L (ref 135–145)
TOTAL PROTEIN: 6.3 g/dL — AB (ref 6.5–8.1)

## 2015-10-02 LAB — CBC WITH DIFFERENTIAL/PLATELET
BASOS ABS: 0 10*3/uL (ref 0.0–0.1)
Basophils Relative: 1 %
EOS ABS: 0.1 10*3/uL (ref 0.0–0.7)
EOS PCT: 1 %
HCT: 30.8 % — ABNORMAL LOW (ref 39.0–52.0)
Hemoglobin: 9.3 g/dL — ABNORMAL LOW (ref 13.0–17.0)
Lymphocytes Relative: 26 %
Lymphs Abs: 1.6 10*3/uL (ref 0.7–4.0)
MCH: 29.7 pg (ref 26.0–34.0)
MCHC: 30.2 g/dL (ref 30.0–36.0)
MCV: 98.4 fL (ref 78.0–100.0)
MONO ABS: 0.3 10*3/uL (ref 0.1–1.0)
Monocytes Relative: 6 %
Neutro Abs: 4 10*3/uL (ref 1.7–7.7)
Neutrophils Relative %: 66 %
PLATELETS: 129 10*3/uL — AB (ref 150–400)
RBC: 3.13 MIL/uL — AB (ref 4.22–5.81)
RDW: 18.4 % — AB (ref 11.5–15.5)
WBC: 6 10*3/uL (ref 4.0–10.5)

## 2015-10-02 LAB — I-STAT TROPONIN, ED: TROPONIN I, POC: 0.03 ng/mL (ref 0.00–0.08)

## 2015-10-02 LAB — PHOSPHORUS: PHOSPHORUS: 3.6 mg/dL (ref 2.5–4.6)

## 2015-10-02 LAB — I-STAT VENOUS BLOOD GAS, ED
ACID-BASE EXCESS: 7 mmol/L — AB (ref 0.0–2.0)
BICARBONATE: 32.1 meq/L — AB (ref 20.0–24.0)
O2 Saturation: 73 %
PH VEN: 7.435 — AB (ref 7.250–7.300)
PO2 VEN: 38 mmHg (ref 30.0–45.0)
TCO2: 34 mmol/L (ref 0–100)
pCO2, Ven: 47.8 mmHg (ref 45.0–50.0)

## 2015-10-02 LAB — MAGNESIUM: MAGNESIUM: 2.7 mg/dL — AB (ref 1.7–2.4)

## 2015-10-02 LAB — AMMONIA: Ammonia: 26 umol/L (ref 9–35)

## 2015-10-02 LAB — I-STAT CG4 LACTIC ACID, ED
LACTIC ACID, VENOUS: 1.45 mmol/L (ref 0.5–2.0)
Lactic Acid, Venous: 1.66 mmol/L (ref 0.5–2.0)

## 2015-10-02 LAB — VITAMIN B12: Vitamin B-12: 1016 pg/mL — ABNORMAL HIGH (ref 180–914)

## 2015-10-02 LAB — FOLATE: FOLATE: 71.7 ng/mL (ref >5.9)

## 2015-10-02 MED ORDER — ASPIRIN 81 MG PO TABS: 81.0000 mg | ORAL_TABLET | Freq: Every day | ORAL | Status: DC

## 2015-10-02 MED ORDER — ONDANSETRON HCL 4 MG PO TABS
4.0000 mg | ORAL_TABLET | Freq: Four times a day (QID) | ORAL | Status: DC | PRN
  Administered 2015-10-04: 4 mg via ORAL
  Filled 2015-10-02: qty 1

## 2015-10-02 MED ORDER — SODIUM CHLORIDE 0.9 % IJ SOLN
3.0000 mL | Freq: Two times a day (BID) | INTRAMUSCULAR | Status: DC
  Administered 2015-10-03 – 2015-10-05 (×5): 3 mL via INTRAVENOUS

## 2015-10-02 MED ORDER — METOPROLOL TARTRATE 25 MG PO TABS
25.0000 mg | ORAL_TABLET | Freq: Two times a day (BID) | ORAL | Status: DC
  Administered 2015-10-02 – 2015-10-05 (×6): 25 mg via ORAL
  Filled 2015-10-02 (×6): qty 1

## 2015-10-02 MED ORDER — DARBEPOETIN ALFA 200 MCG/0.4ML IJ SOSY
200.0000 ug | PREFILLED_SYRINGE | INTRAMUSCULAR | Status: DC
  Administered 2015-10-03: 200 ug via INTRAVENOUS
  Filled 2015-10-02: qty 0.4

## 2015-10-02 MED ORDER — TAMSULOSIN HCL 0.4 MG PO CAPS
0.4000 mg | ORAL_CAPSULE | Freq: Every day | ORAL | Status: DC
  Administered 2015-10-02 – 2015-10-04 (×3): 0.4 mg via ORAL
  Filled 2015-10-02 (×3): qty 1

## 2015-10-02 MED ORDER — HEPARIN SODIUM (PORCINE) 5000 UNIT/ML IJ SOLN
5000.0000 [IU] | Freq: Three times a day (TID) | INTRAMUSCULAR | Status: DC
Start: 2015-10-02 — End: 2015-10-02
  Administered 2015-10-02: 5000 [IU] via SUBCUTANEOUS
  Filled 2015-10-02: qty 1

## 2015-10-02 MED ORDER — ONDANSETRON HCL 4 MG/2ML IJ SOLN: 4.0000 mg | Freq: Four times a day (QID) | INTRAMUSCULAR | Status: DC | PRN

## 2015-10-02 MED ORDER — POLYETHYLENE GLYCOL 3350 17 G PO PACK
17.0000 g | PACK | Freq: Every day | ORAL | Status: DC
  Administered 2015-10-03 – 2015-10-05 (×3): 17 g via ORAL
  Filled 2015-10-02 (×3): qty 1

## 2015-10-02 MED ORDER — PANTOPRAZOLE SODIUM 40 MG PO TBEC
40.0000 mg | DELAYED_RELEASE_TABLET | Freq: Every day | ORAL | Status: DC
  Administered 2015-10-03 – 2015-10-05 (×3): 40 mg via ORAL
  Filled 2015-10-02 (×3): qty 1

## 2015-10-02 MED ORDER — ASPIRIN EC 81 MG PO TBEC: 81.0000 mg | DELAYED_RELEASE_TABLET | Freq: Every day | ORAL | Status: DC

## 2015-10-02 MED ORDER — SIMVASTATIN 40 MG PO TABS
40.0000 mg | ORAL_TABLET | Freq: Every evening | ORAL | Status: DC
  Administered 2015-10-03 – 2015-10-04 (×2): 40 mg via ORAL
  Filled 2015-10-02 (×2): qty 1

## 2015-10-02 MED ORDER — SIMVASTATIN 40 MG PO TABS: 40.0000 mg | ORAL_TABLET | Freq: Every evening | ORAL | Status: DC

## 2015-10-02 MED ORDER — BICALUTAMIDE 50 MG PO TABS
50.0000 mg | ORAL_TABLET | Freq: Every day | ORAL | Status: DC
  Administered 2015-10-03 – 2015-10-05 (×3): 50 mg via ORAL
  Filled 2015-10-02 (×4): qty 1

## 2015-10-02 MED ORDER — METOPROLOL TARTRATE 50 MG PO TABS: 50.0000 mg | ORAL_TABLET | Freq: Two times a day (BID) | ORAL | Status: DC

## 2015-10-02 MED ORDER — HYDROCODONE-ACETAMINOPHEN 5-325 MG PO TABS: 1.0000 | ORAL_TABLET | Freq: Four times a day (QID) | ORAL | Status: DC | PRN

## 2015-10-02 MED ORDER — PANTOPRAZOLE SODIUM 40 MG PO TBEC: 40.0000 mg | DELAYED_RELEASE_TABLET | Freq: Every day | ORAL | Status: DC

## 2015-10-02 NOTE — Consult Note (Signed)
Nephrology Consultation Requesting Physician:  Internal Medicine Teaching Service Reason for Consult:  Provision of dialysis and related services  HPI: The patient is a 74 y.o. year-old AAM who has ESRD (HD TTS [facility]), HLD, gout, PAF, metastatic prostate CA.   I actually evaluated him at the kidney center earlier today when his wife brought him in, reporting the fairly abrupt onset of confusion, disorientation, lethargy, poor po intake (she had to feed him). The nursing staff at dialysis reported that he was his usual self on the day of his last HD which was 12/24 and concurred with the marked change in [place] today.  His BP was in the 80's, lungs were clear, cardiac rhythm irreg.   I recommended that she bring him to the ER for further evaluation.    His head CT was unrevealing, labs pertinent for a Ca of 10.6 (and over the past few weeks at the outpt unit Ca has been 10-10.2 - he has been on ca based phosphate binders, and hectorol for severe hyperparathyroidism) so slightly higher than recent, but not a lot.  (Of note he was admitted October 2016 with HYPO calcemia - but that was related to the administration of denosumab for his prostate Ca).  MRI of the brain has been ordered.  We are asked to see to provide HD.   Past Medical History  Diagnosis Date  . Hyperlipidemia   . Gout   . Secondary hyperparathyroidism ([place])   . Anemia   . Colon, diverticulosis   . Thyroid nodule   . Anginal pain ([place])     sees Dr. Lia Foyer  . Hypertension     sees Dr. Willy Eddy  . 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 Upper respiratory infection April 2016  . CHF (congestive heart failure) ([place])   . 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 ESRD (end stage renal disease) on dialysis ([place])     "TTS; [place]" (06/15/2015)  . Renal insufficiency   . Cancer [facility])      Past Surgical History  Procedure Laterality Date  . Knee surgery Left     cyst removal by Dr. Lorin Mercy  . Av fistula placement Left     left UA  AVF  . Femoral-popliteal bypass graft  10/18/2012    Procedure: BYPASS GRAFT FEMORAL-POPLITEAL ARTERY;  Surgeon: Conrad Kenedy, MD;  [facility];  Service: Vascular;  Laterality: Right;  Right Popliteal Aneurysm Exclusion; Ultrasound guided  . False aneurysm repair Left 12/29/2012    Procedure: REPAIR FALSE ANEURYSM;  Surgeon: Conrad [place], MD;  Location: [place];  Service: Vascular;  Laterality: Left;  . Endovascular repair of popliteal artery aneurysm Left 12/29/2012    Procedure: ENDOVASCULAR REPAIR OF POPLITEAL AND FEMORAL ARTERY ANEURYSM;  Surgeon: Conrad Busby, MD;  Location: [place];  Service: Vascular;  Laterality: Left;  . Abdominal aortagram N/A 09/27/2012    Procedure: ABDOMINAL Maxcine Ham;  Surgeon: Angelia Mould, MD;  Location: [facility] CATH LAB;  Service: Cardiovascular;  Laterality: N/A;  . Tonsillectomy and adenoidectomy       Family History  Problem Relation Age of Onset  . Hypertension Mother   . Diabetes Mother   . Diabetes Brother    Social History:  reports that he quit smoking about 8 years ago. His smoking use included Pipe, Cigars, and Cigarettes. He quit after 50 years of use. He has never used smokeless tobacco. He reports that he drinks alcohol. He reports that he does not  use illicit drugs.   Allergies  Allergen Reactions  . Denosumab Other (See Comments)    Severe symptomatic hypocalcemia 1 week after 120 mg SQ denosumab. Should be avoided completely.     Home medications: Prior to Admission medications   Medication Sig Start Date End Date Taking? Authorizing Provider  amLODipine (NORVASC) 5 MG tablet Take 5 mg by mouth daily.  02/01/15  Yes Historical Provider, MD  aspirin 81 MG tablet Take 81 mg by mouth daily.   Yes Historical Provider, MD  aspirin EC 81 MG EC tablet Take 1 tablet (81 mg total) by mouth daily. 06/28/15  Yes Patrecia Pour, MD  bicalutamide (CASODEX) 50 MG tablet Take 1 tablet (50 mg total) by mouth daily. 06/16/15  Yes Milagros Loll, MD   calcium carbonate (TUMS - DOSED IN MG ELEMENTAL CALCIUM) 500 MG chewable tablet Chew 5 tablets (1,000 mg of elemental calcium total) by mouth 3 (three) times daily with meals. 07/14/15  Yes Asiyah Cletis Media, MD  gabapentin (NEURONTIN) 100 MG capsule Take 1 capsule (100 mg total) by mouth 2 (two) times daily. 06/28/15  Yes Patrecia Pour, MD  HYDROcodone-acetaminophen (NORCO/VICODIN) 5-325 MG per tablet Take 1-2 tablets by mouth every 6 (six) hours as needed for moderate pain or severe pain. 06/17/15  Yes Milagros Loll, MD  metoprolol (LOPRESSOR) 50 MG tablet Take 1 tablet (50 mg total) by mouth 2 (two) times daily. 06/28/15  Yes Patrecia Pour, MD  morphine (MSIR) 15 MG tablet Take 15 mg by mouth every 6 (six) hours as needed for moderate pain or severe pain.  07/06/15  Yes Historical Provider, MD  multivitamin (RENA-VIT) TABS tablet Take 1 tablet by mouth daily.   Yes Historical Provider, MD  omeprazole (PRILOSEC) 40 MG capsule Take 40 mg by mouth daily.   Yes Historical Provider, MD  polyethylene glycol (MIRALAX / GLYCOLAX) packet Take 17 g by mouth daily. 07/14/15  Yes Asiyah Cletis Media, MD  simvastatin (ZOCOR) 40 MG tablet Take 40 mg by mouth every evening.   Yes Historical Provider, MD  tamsulosin (FLOMAX) 0.4 MG CAPS capsule Take 0.4 mg by mouth at bedtime. 07/09/15  Yes Historical Provider, MD    Inpatient medications: . aspirin EC  81 mg Oral Daily  . bicalutamide  50 mg Oral Daily  . heparin  5,000 Units Subcutaneous 3 times per day  . metoprolol  50 mg Oral BID  . pantoprazole  40 mg Oral Daily  . polyethylene glycol  17 g Oral Daily  . simvastatin  40 mg Oral QPM  . sodium chloride  3 mL Intravenous Q12H  . tamsulosin  0.4 mg Oral QHS    Review of Systems See HPI  Physical Exam:  BP 122/63 mmHg  Pulse 66  Temp(Src) 98.1 F (36.7 C) (Oral)  Resp 16  Ht _0  (1.727 m)  Wt 78.019 kg (172 lb)  BMI 26.16 kg/m2  SpO2 94%  Gen: Lethargic older AAM, NAD, awakens, denies  pain Skin: No rashes Neck: JVP 5-6 cm Chest: Grossly clear anteriorly, did not sit him up to listen posteriorly Heart: Irregular with frequent extrasystoles. 2-3/6 diamond shaped murmur LSB/across precordium Abdomen: soft, no focal tenderness Ext: Trace edema. Large aneurysmal AVF + bruit and thrill. Neuro: Lethargic, awakens, difficult to understand, some twitching of his arm and moutt Heme/Lymph: no bruising or LAN  Labs: Basic Metabolic Panel:  Recent Labs Lab 10/02/15 1314  NA 141  K 5.3*  CL 95*  CO2 30  GLUCOSE 101*  BUN 60*  CREATININE 9.48*  CALCIUM 10.6*     Recent Labs Lab 10/02/15 1314  AST 21  ALT 8*  ALKPHOS 5968*  BILITOT 1.0  PROT 6.3*  ALBUMIN 3.6    Recent Labs Lab 10/02/15 1549  AMMONIA 26     Recent Labs Lab 10/02/15 1314  WBC 6.0  NEUTROABS 4.0  HGB 9.3*  HCT 30.8*  MCV 98.4  PLT 129*    Xrays/Other Studies: Dg Chest 2 View  10/02/2015  CLINICAL DATA:  74 year old male with lethargy, disorientation and shortness of breath EXAM: CHEST  2 VIEW COMPARISON:  Prior chest x-ray 07/24/2015 FINDINGS: Cardiomegaly. Atherosclerotic and tortuous thoracic aorta. Pulmonary vascular congestion with mild interstitial edema. No focal airspace consolidation, large pleural effusion or pneumothorax. Stable right apical pleural parenchymal scarring. Irregular patchy multifocal regions of sclerosis throughout the visualized bones consistent with known widespread metastatic disease. IMPRESSION: No significant interval change in the appearance of the chest compared to prior imaging. Cardiomegaly and pulmonary vascular congestion without overt edema. Diffuse scattered sclerotic osseous metastatic disease. Electronically Signed   By: Jacqulynn Cadet M.D.   On: 10/02/2015 13:57   Ct Head Wo Contrast  10/02/2015  CLINICAL DATA:  Altered mental status/lethargy. End-stage renal disease. History of prostate carcinoma EXAM: CT HEAD WITHOUT CONTRAST TECHNIQUE:  Contiguous axial images were obtained from the base of the skull through the vertex without intravenous contrast. COMPARISON:  None. FINDINGS: There is age related volume loss. There is no intracranial mass, hemorrhage, extra-axial fluid collection, or midline shift. There is no apparent intracranial edema. There is slight small vessel disease in the centra semiovale bilaterally. No acute infarct evident. There is extensive calcification in the middle cerebral arteries bilaterally. There is also calcification in both distal vertebral arteries and in the basilar artery. The bony calvarium appears intact although somewhat sclerotic in a generalized manner. Mastoid air cells are clear. No intraorbital lesions are identified. IMPRESSION: Age related volume loss with mild periventricular small vessel disease. There is no intracranial mass or edema. No hemorrhage. No acute infarct evident. There is extensive arterial vascular calcification. Diffuse sclerosis of the calvarium is likely due to chronic renal failure. No well-defined metastatic foci are identified on this study. If intracranial neoplasm remains of concern, brain MRI would be advisable for further assessment. Electronically Signed   By: Lowella Grip III M.D.   On: 10/02/2015 16:07   Dialysis Prescription: TTS [facility] 2K, 2.25 Ca (just changed earlier today to 2Ca) 4 hours, 450/800 L AVF  EDW 72 kg Hectorol 9 mcg TIW Mircera 200 Q2weeks (last dosed 12/15)  Impression/Plan  1. AMS/encephalopathy - CT negative. Corrected Ca around 11 (but the hypercalcemia is not acute - just somewhat worse than on his recent outpt labs) - I can't exclude hypercalcemia as a contributor, but I have not seen this type of abrupt change in MS from an insidious increase in calcium. This is also NOT uremic encephalopathy. He is well dialyzed, does not miss treatments.  Agree with MRI to further evaluate.  2. ESRD - HD this evening. BP is better than when I saw  him at the HD unit earlier today - will modify orders to remove a liter, try to get some pre and post weights. 3. Hypercalcemia - has been on sizable doses of hectorol for severe secondary hyperpara, as well as ca based phos binders. Will hold his hectorol, hold his binders, use low Ca (2Ca) bath for HD.  4. Secondary HPT - last outpt PTH >1000. Hold hectorol d/t elev Ca 5. Anemia - has been on Mircera 200 Q2Weeks. Last dosed 12/15.  Hb has not been over 10 for some time. Will dose Aranesp 200/week while in house. 6. Metastatic prostate Ca - alk phos has almost tripled since October admission. Now close to 6000 despite lupron and bicalutamide. Agree w/MRI brain to further evaluate for brain mets 7. PAF/HTN - he was HYPOtensive at the HD unit earlier today. Recommend reducing his metoprolol to allow for volume removal at HD - I have reduced dose to 25 BID   Jamal Maes,  MD [facility] [phone] pager 10/02/2015, 7:16 PM

## 2015-10-02 NOTE — Progress Notes (Signed)
Pt admitted to 6e10. Central telemetry notified x 2 calls for verification. Box #10. Pt lethargic. Able to answer some questions. C/o pain across bilateral upper back which wife states is cancer pain. Pt usually takes morphine and oxycodone for pain. No skin issues. L AVF ++. VSS. Continue to monitor. Bartholomew Crews, RN

## 2015-10-02 NOTE — Progress Notes (Addendum)
Paged by nurse who informed of Patient's MRI results. Scan notable for:  Acute to early subacute RIGHT frontoparietal subdural hematoma measuring up to 6 mm, no mass effect or midline shift. Small amount of RIGHT frontal suspected subarachnoid hemorrhage.  On evaluation patient was laying in bed asleep. When woken he was somnolent, but arousable. He denied any headaches or recent changes.  Physical Exam: BP 103/52,  HR 59,  RR 16, O2 99% room air, Temp 97.8 F  GCS 14 (open eyes in response to verbal command) General: resting in bed, somnolent but arousable, cooperative, no distress, pleasant Head: normocephalic, atraumatic Eyes: PERRL, EOMI Ext: warm and well perfused Neuro: alert and oriented to person and place, but not time. 5/5 strength in bilateral upper and lower extremities. Left facial droop noted on smile. Finger to nose intact, finger tapping intact. Patient attempts rapid alternating movement which is mildly diminished  A/P: Tim Kirby is a 74 yo male with ESRD (TTS), metastatic prostate cancer to bone, PAH, pAF, HTN, HLD, and gout, presenting with 2 day history of AMS found to have a small right frontoparietal subdural hematoma measuring up to 6 mm. Vital signs stable with borderline bradycardia and physical exam unchanged to slightly improved compared to physical exam note on admission. Discussed with on-call Neurosurgeon, Dr. Arnoldo Morale, who reviewed MRI images. At this time, he does not believe further intervention needed, recommending continued monitoring for sudden changes/ signs of increased intracranial pressure. Also recommends repeat imaging in 1 week. -Appreciate assistance from Neurosurgery -Hold Aspirin -Hold Heparin -Continue to monitor for signs of increased intracranial pressure (respiratory depression,      hypertension, bradycardia, papilledema, headache, N/V, etc.) -If indicated, recontact Neurosurgery  Tim Finders, MD Internal Medicine Resident PGY-1  Pager  [phone]

## 2015-10-02 NOTE — ED Provider Notes (Signed)
CSN: [id number]     Arrival date & time 10/02/15  1246 History   First MD Initiated Contact with Patient 10/02/15 1438     Chief Complaint  Patient presents with  . Altered Mental Status     (Consider location/radiation/quality/duration/timing/severity/associated sxs/prior Treatment) Patient is a 74 y.o. male presenting with altered mental status and general illness. The history is provided by the spouse.  Altered Mental Status Presenting symptoms: confusion and disorientation   Severity:  Mild Most recent episode:  2 days ago Episode history:  Multiple Duration:  2 days Timing:  Constant Progression:  Worsening Chronicity:  Recurrent Context: not alcohol use, not dementia, not drug use, not head injury and not a recent infection   Associated symptoms: no abdominal pain, no agitation, no fever and no headaches   Illness Location:  Body Quality:  Altered Severity:  Mild Onset quality:  Gradual Duration:  2 days Timing:  Constant Progression:  Worsening Chronicity:  Recurrent Relieved by:  None tried Worsened by:  None tired Ineffective treatments:  None tried Associated symptoms: fatigue   Associated symptoms: no abdominal pain, no fever, no headaches and no shortness of breath     Past Medical History  Diagnosis Date  . Hyperlipidemia   . Gout   . Secondary hyperparathyroidism ([place])   . Anemia   . Colon, diverticulosis   . Thyroid nodule   . Anginal pain ([place])     sees Dr. Lia Foyer  . Hypertension     sees Dr. Willy Eddy  . 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 Upper respiratory infection April 2016  . CHF (congestive heart failure) ([place])   . 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 ESRD (end stage renal disease) on dialysis ([place])     "TTS; [place]" (06/15/2015)  . Renal insufficiency   . Cancer [facility])    Past Surgical History  Procedure Laterality Date  . Knee surgery Left     cyst removal by Dr. Lorin Mercy  . Av fistula placement Left     left UA AVF  .  Femoral-popliteal bypass graft  10/18/2012    Procedure: BYPASS GRAFT FEMORAL-POPLITEAL ARTERY;  Surgeon: Conrad [place], MD;  Location: [facility] OR;  Service: Vascular;  Laterality: Right;  Right Popliteal Aneurysm Exclusion; Ultrasound guided  . False aneurysm repair Left 12/29/2012    Procedure: REPAIR FALSE ANEURYSM;  Surgeon: Conrad Foyil, MD;  Location: [place];  Service: Vascular;  Laterality: Left;  . Endovascular repair of popliteal artery aneurysm Left 12/29/2012    Procedure: ENDOVASCULAR REPAIR OF POPLITEAL AND FEMORAL ARTERY ANEURYSM;  Surgeon: Conrad Beecher, MD;  Location: [place];  Service: Vascular;  Laterality: Left;  . Abdominal aortagram N/A 09/27/2012    Procedure: ABDOMINAL Maxcine Ham;  Surgeon: Angelia Mould, MD;  Location: [facility] CATH LAB;  Service: Cardiovascular;  Laterality: N/A;  . Tonsillectomy and adenoidectomy     Family History  Problem Relation Age of Onset  . Hypertension Mother   . Diabetes Mother   . Diabetes Brother    Social History  Substance Use Topics  . Smoking status: Former Smoker -- 50 years    Types: Pipe, Cigars, Cigarettes    Quit date: 09/18/2007  . Smokeless tobacco: Never Used     Comment: "mostly smoked cigars and pipes"  . Alcohol Use: Yes     Comment: "stopped drinking in ~ 2008"    Review of Systems  Unable to perform ROS: Mental status change  Constitutional:  Positive for fatigue. Negative for fever and chills.  Eyes: Negative for pain.  Respiratory: Negative for choking and shortness of breath.   Gastrointestinal: Negative for abdominal pain.  Neurological: Negative for headaches.  Psychiatric/Behavioral: Positive for confusion. Negative for agitation.      Allergies  Denosumab  Home Medications   Prior to Admission medications   Medication Sig Start Date End Date Taking? Authorizing Provider  Albuterol Sulfate 108 (90 BASE) MCG/ACT AEPB Inhale 2 puffs into the lungs every 4 (four) hours as needed (wheezing).     Historical  Provider, MD  amLODipine (NORVASC) 5 MG tablet Take 5 mg by mouth daily.  02/01/15   Historical Provider, MD  aspirin EC 81 MG EC tablet Take 1 tablet (81 mg total) by mouth daily. 06/28/15   Patrecia Pour, MD  bicalutamide (CASODEX) 50 MG tablet Take 1 tablet (50 mg total) by mouth daily. 06/16/15   Milagros Loll, MD  calcium carbonate (TUMS - DOSED IN MG ELEMENTAL CALCIUM) 500 MG chewable tablet Chew 5 tablets (1,000 mg of elemental calcium total) by mouth 3 (three) times daily with meals. 07/14/15   Asiyah Cletis Media, MD  gabapentin (NEURONTIN) 100 MG capsule Take 1 capsule (100 mg total) by mouth 2 (two) times daily. 06/28/15   Patrecia Pour, MD  HYDROcodone-acetaminophen (NORCO/VICODIN) 5-325 MG per tablet Take 1-2 tablets by mouth every 6 (six) hours as needed for moderate pain or severe pain. 06/17/15   Milagros Loll, MD  metoprolol (LOPRESSOR) 50 MG tablet Take 1 tablet (50 mg total) by mouth 2 (two) times daily. 06/28/15   Patrecia Pour, MD  morphine (MSIR) 15 MG tablet Take 15 mg by mouth every 6 (six) hours as needed for moderate pain or severe pain.  07/06/15   Historical Provider, MD  multivitamin (RENA-VIT) TABS tablet Take 1 tablet by mouth daily.    Historical Provider, MD  omeprazole (PRILOSEC) 40 MG capsule Take 40 mg by mouth daily.    Historical Provider, MD  polyethylene glycol (MIRALAX / GLYCOLAX) packet Take 17 g by mouth daily. 07/14/15   Asiyah Cletis Media, MD  simvastatin (ZOCOR) 40 MG tablet Take 40 mg by mouth every evening.    Historical Provider, MD  tamsulosin (FLOMAX) 0.4 MG CAPS capsule Take 0.4 mg by mouth at bedtime. 07/09/15   Historical Provider, MD   BP 102/59 mmHg  Pulse 59  Temp(Src) 98 F (36.7 C) (Oral)  Resp 16  Ht 5\' 8"  (1.727 m)  Wt 172 lb (78.019 kg)  BMI 26.16 kg/m2  SpO2 98% Physical Exam  Constitutional: He is oriented to person, place, and time. He appears well-developed and well-nourished.  HENT:  Head: Normocephalic and atraumatic.   Cardiovascular:  Murmur heard. Pulmonary/Chest: He has no wheezes. He has no rales.  Abdominal: Soft. He exhibits no distension. There is no tenderness.  Musculoskeletal: Normal range of motion. He exhibits no edema or tenderness.  Neurological: He is alert and oriented to person, place, and time. No cranial nerve deficit. Coordination normal.  No altered mental status, able to give full seemingly accurate history.  Face is symmetric, EOM's intact, pupils equal and reactive, vision intact, tongue and uvula midline without deviation Upper and Lower extremity motor 5/5, intact pain perception in distal extremities, 2+ reflexes in biceps, patella and achilles tendons. Finger to nose normal, heel to shin normal. Asterixis present  Nursing note and vitals reviewed.   ED Course  Procedures (including critical care time) Labs  Review Labs Reviewed  COMPREHENSIVE METABOLIC PANEL - Abnormal; Notable for the following:    Potassium 5.3 (*)    Chloride 95 (*)    Glucose, Bld 101 (*)    BUN 60 (*)    Creatinine, Ser 9.48 (*)    Calcium 10.6 (*)    Total Protein 6.3 (*)    ALT 8 (*)    Alkaline Phosphatase 5968 (*)    GFR calc non Af Amer 5 (*)    GFR calc Af Amer 6 (*)    Anion gap 16 (*)    All other components within normal limits  CBC WITH DIFFERENTIAL/PLATELET - Abnormal; Notable for the following:    RBC 3.13 (*)    Hemoglobin 9.3 (*)    HCT 30.8 (*)    RDW 18.4 (*)    Platelets 129 (*)    All other components within normal limits  CULTURE, BLOOD (ROUTINE X 2)  CULTURE, BLOOD (ROUTINE X 2)  URINE CULTURE  URINALYSIS, ROUTINE W REFLEX MICROSCOPIC (NOT AT [facility])  I-STAT CG4 LACTIC ACID, ED  CBG MONITORING, ED    Imaging Review Dg Chest 2 View  10/02/2015  CLINICAL DATA:  74 year old male with lethargy, disorientation and shortness of breath EXAM: CHEST  2 VIEW COMPARISON:  Prior chest x-ray 07/24/2015 FINDINGS: Cardiomegaly. Atherosclerotic and tortuous thoracic aorta.  Pulmonary vascular congestion with mild interstitial edema. No focal airspace consolidation, large pleural effusion or pneumothorax. Stable right apical pleural parenchymal scarring. Irregular patchy multifocal regions of sclerosis throughout the visualized bones consistent with known widespread metastatic disease. IMPRESSION: No significant interval change in the appearance of the chest compared to prior imaging. Cardiomegaly and pulmonary vascular congestion without overt edema. Diffuse scattered sclerotic osseous metastatic disease. Electronically Signed   By: Jacqulynn Cadet M.D.   On: 10/02/2015 13:57   I have personally reviewed and evaluated these images and lab results as part of my medical decision-making.   EKG Interpretation None      MDM   Final diagnoses:  Altered mental status, unspecified altered mental status type     patient with altered mental status likely secondary to uremia but did start shortly after previous hemodialysis and Saturday. Is disoriented to  Place time and situation. Does know person and his wife. Asterixis on exam however ammonia is normal as metastatic prostate cancer so CT done to evaluate for any kind of intracranial metastasis and this was normal. Rest of labs relatively unremarkable aside from uremia consistent with his chronic kidney disease. Discussed case with internal medicine who will admit for dialysis and further workup as needed.    Merrily Pew, MD 10/02/15 [phone]

## 2015-10-02 NOTE — H&P (Signed)
Date: 10/02/2015               Patient Name:  Tim Kirby MRN: [id number]  DOB: [birth year] Age / Sex: 74 y.o., male   PCP: Estill Bamberg. Megan Salon, MD         Medical Service: Internal Medicine Teaching Service         Attending Physician: Dr. Annia Belt, MD    First Contact: Dr. Lovena Le Pager: [phone]  Second Contact: Dr. Marvel Plan Pager: [phone]       After Hours (After 5p/  First Contact Pager: [phone]  weekends / holidays): Second Contact Pager: [phone]   Chief Complaint: AMS  History of Present Illness: Mr. Partington is a 74 yo male with ESRD (TTS), metastatic prostate cancer to bone, PAH, pAF, HTN, HLD, and gout, presenting with 2 day history of AMS.  History is obtained from the wife.  She reports patient being near his normal baseline on 12/25, ambulating and conversant.  However, on 12/26, patient was extremely somnolent, constantly falling asleep. He ate nothing during the day, and would fall asleep between bites at dinner.  He was nonconversant, speaking in incomplete sentences, and was disoriented.  Patient's wife also noticed a twitching of the LUE, described as a jerking motion.  He was in a similar state when he went to dialysis today (12/27), and was sent to the ED from dialysis for AMS.  His last dialysis session was Saturday (12/24).   The patient has had some weight loss recently, and he will intermittently be nauseated, which is attributed to his cancer and chemotherapy.  Patient's wife denies recent weakness, falls, facial droop, or dysarthria.  He has never had a stroke.   He received Gabapentin yesterday, but has not received it today.  He has not received his Norco or Morphine yesterday or today.  He does not drink alcohol or use illicit drugs.  Patient's wife denies complaint of fever, chills, congestion, cough, dysphagia, CP, SOB, orthopnea, constipation, diarrhea, dysuria, hematuria, hematochezia, or melena.    He was diagnosed with metastatic prostate  cancer in Oct 2016.  To the wife's knowledge, it is only metastatic to bone.  He is followed by Dr. Luiz Ochoa at [facility].  He is currently receiving Lupron and Bicalutamide.   He has been on dialysis for several (~7) years 2/2 HTN.   In the ED, CT head was negative for mass, edema, hemorrhage, or acute infarct.  BNP was elevated to 3200.  Patient was maintaining 94% O2 on RA.  ALP was elevated to 6000.   Meds: Current Facility-Administered Medications  Medication Dose Route Frequency Provider Last Rate Last Dose  . aspirin tablet 81 mg  81 mg Oral Daily Alexa Sherral Hammers, MD      . bicalutamide (CASODEX) tablet 50 mg  50 mg Oral Daily Alexa Sherral Hammers, MD      . heparin injection 5,000 Units  5,000 Units Subcutaneous 3 times per day Alexa Sherral Hammers, MD      . metoprolol (LOPRESSOR) tablet 50 mg  50 mg Oral BID Alexa Sherral Hammers, MD      . ondansetron [facility]) tablet 4 mg  4 mg Oral Q6H PRN Alexa Sherral Hammers, MD       Or  . ondansetron (ZOFRAN) injection 4 mg  4 mg Intravenous Q6H PRN Alexa Sherral Hammers, MD      . pantoprazole (PROTONIX) EC tablet 40 mg  40 mg Oral Daily Alexa Sherral Hammers, MD      .  polyethylene glycol (MIRALAX / GLYCOLAX) packet 17 g  17 g Oral Daily Alexa Sherral Hammers, MD      . simvastatin (ZOCOR) tablet 40 mg  40 mg Oral QPM Alexa Sherral Hammers, MD      . sodium chloride 0.9 % injection 3 mL  3 mL Intravenous Q12H Alexa Sherral Hammers, MD      . tamsulosin (FLOMAX) capsule 0.4 mg  0.4 mg Oral QHS Alexa Sherral Hammers, MD        Allergies: Allergies as of 10/02/2015 - Review Complete 10/02/2015  Allergen Reaction Noted  . Denosumab Other (See Comments) 07/14/2015   Past Medical History  Diagnosis Date  . Hyperlipidemia   . Gout   . Secondary hyperparathyroidism ([place])   . Anemia   . Colon, diverticulosis   . Thyroid nodule   . Anginal pain ([place])     sees Dr. Lia Foyer  . Hypertension     sees Dr. Willy Eddy  . 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  Upper respiratory infection April 2016  . CHF (congestive heart failure) ([place])   . 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 ESRD (end stage renal disease) on dialysis ([place])     "TTS; [place]" (06/15/2015)  . Renal insufficiency   . Cancer [facility])    Past Surgical History  Procedure Laterality Date  . Knee surgery Left     cyst removal by Dr. Lorin Mercy  . Av fistula placement Left     left UA AVF  . Femoral-popliteal bypass graft  10/18/2012    Procedure: BYPASS GRAFT FEMORAL-POPLITEAL ARTERY;  Surgeon: Conrad [place], MD;  Location: [facility] OR;  Service: Vascular;  Laterality: Right;  Right Popliteal Aneurysm Exclusion; Ultrasound guided  . False aneurysm repair Left 12/29/2012    Procedure: REPAIR FALSE ANEURYSM;  Surgeon: Conrad [place], MD;  Location: [place];  Service: Vascular;  Laterality: Left;  . Endovascular repair of popliteal artery aneurysm Left 12/29/2012    Procedure: ENDOVASCULAR REPAIR OF POPLITEAL AND FEMORAL ARTERY ANEURYSM;  Surgeon: Conrad Ralston, MD;  Location: [place];  Service: Vascular;  Laterality: Left;  . Abdominal aortagram N/A 09/27/2012    Procedure: ABDOMINAL Maxcine Ham;  Surgeon: Angelia Mould, MD;  Location: [facility] CATH LAB;  Service: Cardiovascular;  Laterality: N/A;  . Tonsillectomy and adenoidectomy     Family History  Problem Relation Age of Onset  . Hypertension Mother   . Diabetes Mother   . Diabetes Brother    Social History   Social History  . Marital Status: Married    Spouse Name: N/A  . Number of Children: N/A  . Years of Education: N/A   Occupational History  . Not on file.   Social History Main Topics  . Smoking status: Former Smoker -- 50 years    Types: Pipe, Cigars, Cigarettes    Quit date: 09/18/2007  . Smokeless tobacco: Never Used     Comment: "mostly smoked cigars and pipes"  . Alcohol Use: Yes     Comment: "stopped drinking in ~ 2008"  . Drug Use: No  . Sexual Activity: Not Currently   Other Topics Concern  . Not on file    Social History Narrative    Review of Systems: Pertinent items noted in HPI and remainder of comprehensive ROS otherwise negative.  Physical Exam: Blood pressure 122/63, pulse 66, temperature 98.1 F (36.7 C), temperature source Oral, resp. rate 16, height '5\' 8"'  (1.727 m), weight 172 lb (78.019 kg),  SpO2 94 %. Physical Exam  Constitutional: He is well-developed, well-nourished, and in no distress. No distress.  Elderly male, lying in bed, somnolent. Easily arouseable. NAD  HENT:  Head: Normocephalic and atraumatic.  Eyes: EOM are normal. Pupils are equal, round, and reactive to light.  Sclera pigmented, questionably icteric.  Neck: JVD present. No tracheal deviation present.  Cardiovascular: Normal rate, regular rhythm and intact distal pulses.   Grade III/VI systolic crescendo-decrescendo murmur heard best at RUSB with radiation to carotids.   Pulmonary/Chest: Effort normal. No respiratory distress. He has no wheezes.  Moderate crackles in bilateral bases.  Abdominal: Soft. He exhibits no distension. There is no tenderness. There is no rebound and no guarding.  Musculoskeletal:  1+ edema to midshin.  Neurological: He is alert.  Oriented to person and place.  Not oriented to time.  CNII-XII intact to bedside testing, except CN VII with 5/5 orbicularis oculi and cheek puff, but asymmetric smile with left cheek droop.  No facial droop appreciated at rest.   5/5 strength in bilateral UE and LE.  Biceps and patellar reflexes 2+ and symmetric.  Babinski negative bilaterally.    Rapid alternating movements and pronator drift could not be assessed 2/2 patient understanding/cooperation.  Skin: Skin is warm and dry. No rash noted. He is not diaphoretic.     Lab results: Basic Metabolic Panel:  Recent Labs  10/02/15 1314  NA 141  K 5.3*  CL 95*  CO2 30  GLUCOSE 101*  BUN 60*  CREATININE 9.48*  CALCIUM 10.6*   Liver Function Tests:  Recent Labs  10/02/15 1314  AST 21   ALT 8*  ALKPHOS 5968*  BILITOT 1.0  PROT 6.3*  ALBUMIN 3.6   No results for input(s): LIPASE, AMYLASE in the last 72 hours.  Recent Labs  10/02/15 1549  AMMONIA 26   CBC:  Recent Labs  10/02/15 1314  WBC 6.0  NEUTROABS 4.0  HGB 9.3*  HCT 30.8*  MCV 98.4  PLT 129*     Imaging results:  Dg Chest 2 View  10/02/2015  CLINICAL DATA:  74 year old male with lethargy, disorientation and shortness of breath EXAM: CHEST  2 VIEW COMPARISON:  Prior chest x-ray 07/24/2015 FINDINGS: Cardiomegaly. Atherosclerotic and tortuous thoracic aorta. Pulmonary vascular congestion with mild interstitial edema. No focal airspace consolidation, large pleural effusion or pneumothorax. Stable right apical pleural parenchymal scarring. Irregular patchy multifocal regions of sclerosis throughout the visualized bones consistent with known widespread metastatic disease. IMPRESSION: No significant interval change in the appearance of the chest compared to prior imaging. Cardiomegaly and pulmonary vascular congestion without overt edema. Diffuse scattered sclerotic osseous metastatic disease. Electronically Signed   By: Jacqulynn Cadet M.D.   On: 10/02/2015 13:57   Ct Head Wo Contrast  10/02/2015  CLINICAL DATA:  Altered mental status/lethargy. End-stage renal disease. History of prostate carcinoma EXAM: CT HEAD WITHOUT CONTRAST TECHNIQUE: Contiguous axial images were obtained from the base of the skull through the vertex without intravenous contrast. COMPARISON:  None. FINDINGS: There is age related volume loss. There is no intracranial mass, hemorrhage, extra-axial fluid collection, or midline shift. There is no apparent intracranial edema. There is slight small vessel disease in the centra semiovale bilaterally. No acute infarct evident. There is extensive calcification in the middle cerebral arteries bilaterally. There is also calcification in both distal vertebral arteries and in the basilar artery. The bony  calvarium appears intact although somewhat sclerotic in a generalized manner. Mastoid air cells are clear. No intraorbital  lesions are identified. IMPRESSION: Age related volume loss with mild periventricular small vessel disease. There is no intracranial mass or edema. No hemorrhage. No acute infarct evident. There is extensive arterial vascular calcification. Diffuse sclerosis of the calvarium is likely due to chronic renal failure. No well-defined metastatic foci are identified on this study. If intracranial neoplasm remains of concern, brain MRI would be advisable for further assessment. Electronically Signed   By: Lowella Grip III M.D.   On: 10/02/2015 16:07    Other results: EKG: sinus rhythm with PACs and PVCs..  Assessment & Plan by Problem: Principal Problem:   Acute encephalopathy Active Problems:   Essential hypertension   End stage renal disease (HCC)   AF (paroxysmal atrial fibrillation) (HCC)   Prostate cancer metastatic to bone [facility])  Mr. Carne is a 74 yo male with ESRD (TTS), metastatic prostate cancer to bone, PAH, pAF, HTN, HLD, and gout, presenting with 2 day history of AMS.  Acute Encephalopathy: Etiology of somnolence and AMS unclear.  Differential is broad, including hypercalcemia of malignancy and uremia or other metabolic disturbance 2/2 missed dialysis > brain metastasis, CVA, infection, thyroid dysfunction, or nutritional deficiency.  CXR shows edema, but is negative for infiltrate and patient sleeping comfortably on his back with good O2 saturation.  Therefore, unlikely to be hypoxia.  He has not received narcotics in 2 days and does not receive them scheduled, so it is unlikely to be intoxication or withdrawal.  He has been afebrile without other SIRS criteria, but U/A is pending urine sample.  Given his metastatic disease and elevated alk phos, there is concern for hypercalcemia or brain metastasis despite negative CT head.  Corrected Calcium is 11.0 and he has  been hypocalcemic in the past. Therefore, a new acute, relative hypercalcemia may account for his symptoms.  Whether it is hypercalcemia vs uremia, patient will need dialysis for correction with subsequent re-evaluation. '[ ]'  MRI brain '[ ]'  Ionized Ca '[ ]'  TSH, RPR, B12, Folate, Mg, Phos '[ ]'  U/A - Nephrology consult for dialysis - PT/OT/SLP  Metastatic Prostate Cancer: Diagnosed Oct 2016. Followed by Dr. Luiz Ochoa in [place]. Concern for brain metastasis given AMS, as above. '[ ]'  MRI brain - Bicalutamide - Lupron - Tamsulosin  HTN/pAF: Metoprolol 50 BID HLD: Simvastatin 40 mg GERD: Protonix  FEN/GI: - Renal - Miralax   DVT Ppx: Heparin  Dispo: Disposition is deferred at this time, awaiting improvement of current medical problems. Anticipated discharge in approximately 2-3 day(s).   The patient does have a current PCP Annie Main D. Megan Salon, MD) and does need an [facility] hospital follow-up appointment after discharge.  The patient does not have transportation limitations that hinder transportation to clinic appointments.  Signed: Iline Oven, MD, PhD 10/02/2015, 6:21 PM

## 2015-10-02 NOTE — ED Notes (Signed)
Pt in from home per spouse the pt had full HD tx Saturday & was told to come here today when he went to get his HD tx, pt did not get HD today, per family the pt was lethargic onset yesterday, pt unable to state age, month, pt disoriented to situation, pt answers questions, pt Alert to person, pts spouse reports having to feed him, pt denies pain

## 2015-10-03 DIAGNOSIS — I480 Paroxysmal atrial fibrillation: Secondary | ICD-10-CM

## 2015-10-03 DIAGNOSIS — R4182 Altered mental status, unspecified: Secondary | ICD-10-CM

## 2015-10-03 DIAGNOSIS — N186 End stage renal disease: Secondary | ICD-10-CM

## 2015-10-03 DIAGNOSIS — C7951 Secondary malignant neoplasm of bone: Secondary | ICD-10-CM

## 2015-10-03 DIAGNOSIS — G9340 Encephalopathy, unspecified: Secondary | ICD-10-CM

## 2015-10-03 DIAGNOSIS — C61 Malignant neoplasm of prostate: Secondary | ICD-10-CM

## 2015-10-03 DIAGNOSIS — I10 Essential (primary) hypertension: Secondary | ICD-10-CM

## 2015-10-03 LAB — COMPREHENSIVE METABOLIC PANEL
ALBUMIN: 3.2 g/dL — AB (ref 3.5–5.0)
ALT: 7 U/L — ABNORMAL LOW (ref 17–63)
ANION GAP: 15 (ref 5–15)
AST: 19 U/L (ref 15–41)
Alkaline Phosphatase: 5535 U/L — ABNORMAL HIGH (ref 38–126)
BUN: 24 mg/dL — ABNORMAL HIGH (ref 6–20)
CO2: 28 mmol/L (ref 22–32)
Calcium: 9.8 mg/dL (ref 8.9–10.3)
Chloride: 96 mmol/L — ABNORMAL LOW (ref 101–111)
Creatinine, Ser: 4.84 mg/dL — ABNORMAL HIGH (ref 0.61–1.24)
GFR calc non Af Amer: 11 mL/min — ABNORMAL LOW (ref >60)
GFR, EST AFRICAN AMERICAN: 12 mL/min — AB (ref >60)
GLUCOSE: 93 mg/dL (ref 65–99)
POTASSIUM: 3.5 mmol/L (ref 3.5–5.1)
SODIUM: 139 mmol/L (ref 135–145)
TOTAL PROTEIN: 5.3 g/dL — AB (ref 6.5–8.1)
Total Bilirubin: 1 mg/dL (ref 0.3–1.2)

## 2015-10-03 LAB — RENAL FUNCTION PANEL
ALBUMIN: 3.2 g/dL — AB (ref 3.5–5.0)
Anion gap: 14 (ref 5–15)
BUN: 24 mg/dL — AB (ref 6–20)
CALCIUM: 9.8 mg/dL (ref 8.9–10.3)
CHLORIDE: 96 mmol/L — AB (ref 101–111)
CO2: 28 mmol/L (ref 22–32)
CREATININE: 4.84 mg/dL — AB (ref 0.61–1.24)
GFR, EST AFRICAN AMERICAN: 12 mL/min — AB (ref >60)
GFR, EST NON AFRICAN AMERICAN: 11 mL/min — AB (ref >60)
Glucose, Bld: 91 mg/dL (ref 65–99)
PHOSPHORUS: 3.2 mg/dL (ref 2.5–4.6)
Potassium: 3.5 mmol/L (ref 3.5–5.1)
Sodium: 138 mmol/L (ref 135–145)

## 2015-10-03 LAB — CBC
HEMATOCRIT: 27.6 % — AB (ref 39.0–52.0)
HEMOGLOBIN: 8.3 g/dL — AB (ref 13.0–17.0)
MCH: 29 pg (ref 26.0–34.0)
MCHC: 30.1 g/dL (ref 30.0–36.0)
MCV: 96.5 fL (ref 78.0–100.0)
Platelets: 104 10*3/uL — ABNORMAL LOW (ref 150–400)
RBC: 2.86 MIL/uL — AB (ref 4.22–5.81)
RDW: 18.3 % — ABNORMAL HIGH (ref 11.5–15.5)
WBC: 4.3 10*3/uL (ref 4.0–10.5)

## 2015-10-03 LAB — CALCIUM, IONIZED
CALCIUM, IONIZED, SERUM: 5 mg/dL (ref 4.5–5.6)
Calcium, Ionized, Serum: 5.1 mg/dL (ref 4.5–5.6)

## 2015-10-03 LAB — RPR: RPR: NONREACTIVE

## 2015-10-03 LAB — PSA: PSA: 23.05 ng/mL — AB (ref 0.00–4.00)

## 2015-10-03 LAB — TSH: TSH: 0.717 u[IU]/mL (ref 0.350–4.500)

## 2015-10-03 MED ORDER — NEPRO/CARBSTEADY PO LIQD
237.0000 mL | Freq: Two times a day (BID) | ORAL | Status: DC
  Administered 2015-10-03 – 2015-10-05 (×2): 237 mL via ORAL

## 2015-10-03 MED ORDER — ALBUMIN HUMAN 25 % IV SOLN
INTRAVENOUS | Status: AC
  Administered 2015-10-03: 25 g via INTRAVENOUS
  Filled 2015-10-03: qty 100

## 2015-10-03 MED ORDER — HYDROCODONE-ACETAMINOPHEN 5-325 MG PO TABS
1.0000 | ORAL_TABLET | Freq: Four times a day (QID) | ORAL | Status: DC | PRN
  Administered 2015-10-03: 2 via ORAL
  Filled 2015-10-03: qty 2

## 2015-10-03 MED ORDER — ALBUMIN HUMAN 25 % IV SOLN
25.0000 g | Freq: Once | INTRAVENOUS | Status: AC
  Administered 2015-10-03: 25 g via INTRAVENOUS

## 2015-10-03 MED ORDER — ACETAMINOPHEN 325 MG PO TABS: 325.0000 mg | ORAL_TABLET | ORAL | Status: DC | PRN

## 2015-10-03 MED ORDER — RENA-VITE PO TABS
1.0000 | ORAL_TABLET | Freq: Every day | ORAL | Status: DC
  Administered 2015-10-03 – 2015-10-04 (×2): 1 via ORAL
  Filled 2015-10-03 (×2): qty 1

## 2015-10-03 MED ORDER — DARBEPOETIN ALFA 200 MCG/0.4ML IJ SOSY
PREFILLED_SYRINGE | INTRAMUSCULAR | Status: AC
  Filled 2015-10-03: qty 0.4

## 2015-10-03 NOTE — Progress Notes (Signed)
Subjective: [name] does not have any complaints today; he denies any headache, focal numbness or weakness, or bone pain. He appears to be a little more lucid today after getting dialysis.  Objective: Vital signs in last 24 hours: Filed Vitals:   10/03/15 0430 10/03/15 0447 10/03/15 0500 10/03/15 0555  BP: 112/65 111/61 115/68 112/61  Pulse: 84 87 86 82  Temp:  97.6 F (36.4 C)  98.6 F (37 C)  TempSrc:  Oral  Oral  Resp: 10 17 20 18   Height:      Weight:  164 lb 0.4 oz (74.4 kg)    SpO2:  100%  96%   Weight change:   Intake/Output Summary (Last 24 hours) at 10/03/15 0703 Last data filed at 10/03/15 0600  Gross per 24 hour  Intake    200 ml  Output    -50 ml  Net    250 ml   General: resting in bed comfortably, in no distress HEENT: arcus senilis, extra-ocular muscles intact, oropharynx without lesions Cardiac: irregular rhythm with 2/6 systolic murmur loudest over right upper sternal border Pulm: breathing well, clear to auscultation bilaterally Abd: bowel sounds normal, soft, nondistended, non-tender Ext: warm and well perfused, without pedal edema, with scars on bilateral legs from arterial bypass surgies Lymph: no cervical or supraclavicular lymphadenopathy Skin: no rash, hair, or nail changes Neuro: He is alert and oriented to self and place only. Most notably, he has decreased concentration as he can spell "cat" but he cannot spell it backwards. He has trouble focusing but was able to easily tell [place] his birthday and wife's phone number. Otherwise his neurologic exam is non-focal; cranial nerves are grossly intact, and his strength is 5 out of 5 throughout.  Lab Results: Basic Metabolic Panel:  Recent Labs Lab 10/02/15 1314 10/02/15 2002  NA 141  --   K 5.3*  --   CL 95*  --   CO2 30  --   GLUCOSE 101*  --   BUN 60*  --   CREATININE 9.48*  --   CALCIUM 10.6*  --   MG  --  2.7*  PHOS  --  3.6   Liver Function Tests:  Recent Labs Lab 10/02/15 1314    AST 21  ALT 8*  ALKPHOS 5968*  BILITOT 1.0  PROT 6.3*  ALBUMIN 3.6   CBC:  Recent Labs Lab 10/02/15 1314  WBC 6.0  NEUTROABS 4.0  HGB 9.3*  HCT 30.8*  MCV 98.4  PLT 129*   Anemia Panel:  Recent Labs Lab 10/02/15 2002  VITAMINB12 1016*  FOLATE 71.7   Studies/Results: Mr Brain Wo Contrast  10/02/2015  CLINICAL DATA:  Two day history of altered mental status. Somnolent. LEFT upper extremity twitching. History of end-stage renal disease on dialysis, metastatic prostate cancer, hypertension and hyperlipidemia. Evaluate acute encephalopathy. EXAM: MRI HEAD WITHOUT CONTRAST TECHNIQUE: Multiplanar, multiecho pulse sequences of the brain and surrounding structures were obtained without intravenous contrast. COMPARISON:  CT head October 02, 2015 at 1557 hours FINDINGS: Moderate to severe ventriculomegaly on the basis of global parenchymal brain volume loss. Patchy supratentorial white matter T2 hyperintensities without midline shift, mass effect, mass lesions. No reduced diffusion to suggest acute ischemia. No susceptibility artifact to suggest intraparenchymal hemorrhage. Small RIGHT frontal parietal subdural hematoma with intermediate to bright T1, bright FLAIR signal measuring up to 6 mm with small amount suspected subarachnoid hemorrhage RIGHT frontal sulci. No extra-axial masses though, contrast enhanced sequences would be more sensitive. Dolicoectatic  intracranial vascular flow voids at the skull base. Ocular globes and orbital contents are unremarkable though not tailored for evaluation. No abnormal sellar expansion. Diffusely low bone marrow signal compatible with diffuse osseous metastasis as previously noted. Craniocervical junction maintained. Paranasal sinuses are well-aerated. Small bilateral mastoid effusions. IMPRESSION: Acute to early subacute RIGHT frontoparietal subdural hematoma measuring up to 6 mm, no mass effect or midline shift. Small amount of RIGHT frontal suspected  subarachnoid hemorrhage. Moderate to severe global brain atrophy. Mild chronic small vessel ischemic disease. Dolicoectatic intracranial vessels compatible with chronic hypertension. These results will be called to the ordering clinician or representative by the Radiologist Assistant, and communication documented in the PACS or zVision Dashboard. Electronically Signed   By: Elon Alas M.D.   On: 10/02/2015 22:27   Medications: I have reviewed the patient's current medications. Scheduled Meds: . bicalutamide  50 mg Oral Daily  . Darbepoetin Alfa      . darbepoetin (ARANESP) injection - DIALYSIS  200 mcg Intravenous Q Tue-HD  . metoprolol  25 mg Oral BID  . pantoprazole  40 mg Oral Daily  . polyethylene glycol  17 g Oral Daily  . simvastatin  40 mg Oral QPM  . sodium chloride  3 mL Intravenous Q12H  . tamsulosin  0.4 mg Oral QHS   Continuous Infusions:  PRN Meds:.acetaminophen, HYDROcodone-acetaminophen, ondansetron **OR** ondansetron (ZOFRAN) IV   Assessment/Plan:  Slowed executive functioning: At this point, it appears that his somnolence and decreased concentration are most likely either from progression of his metastatic prostate cancer or taking narcotics that aren't being sufficiently renally cleared. He appears to be a little more lucid today after holding his narcotics. He did have an incidental 6 mm right frontal subdural hematoma, but his neurologic exam is nonfocal, so I don't think this is fully explaining his encephalopathy. Other causes of acute encephalopathy have been ruled out; there are no signs of infection, his ionized calcium is normal, and he does not have any lesions in his brain. -Started Tylenol for mild to moderate pain -Continue oxycodone for breakthrough pain  Stage IV prostate cancer: His alkaline phosphatase level was 6000 on admission, up from 1500 in July of this year. Dr. Beryle Beams called his oncologist, Dr. Bobby Rumpf, who told us his PSA had dropped from  101 in September to 32 in November, which he believed was an adequate response, and the alkaline phosphatase was likely elevated from his end-stage renal disease. Still, we question whether he has worsening bony metastases. We will get a bone scan, and recheck another PSA. -Ordered bone scan -Ordered PSA -Continue bicalutamide 50 mg daily  Subdural hematoma: A 6 mm right frontal subdural hematoma was noted on his MRI last night. There was no evidence of midline shift. Neurosurgery was called, who did not feel he needed surgical intervention, but did recommend a repeat CT head in one week. -Repeat CT head in one week  Atrial fibrillation: His rates are well controlled. He is not anticoagulated. We'll continue his metoprolol. -Continue metoprolol 25 mg twice daily  End-stage renal disease: Continue dialysis on Tuesdays, Thursdays, Saturdays.  Dispo: Disposition is deferred at this time, awaiting improvement of current medical problems.  Anticipated discharge in approximately 1-2 day(s).   The patient does have a current PCP Annie Main D. Megan Salon, MD) and does need an [facility] hospital follow-up appointment after discharge.  The patient does have transportation limitations that hinder transportation to clinic appointments.  .Services Needed at time of discharge: Y = Yes, Blank =  No PT:   OT:   RN:   Equipment:   Other:     LOS: 1 day   Loleta Chance, MD 10/03/2015, 7:03 AM

## 2015-10-03 NOTE — Significant Event (Signed)
Rapid Response Event Note  Overview: Time Called: 1406 Arrival Time: 1410 Event Type: Neurologic  Initial Focused Assessment: Left sided weakness with reported new onset    Interventions: Nihss performed.    Event Summary: Called to assist with care of patient with reported new onset of left side weakness. Chart was reviewed with unit RN. Dr. Jonnie Finner is at bedside. NIHSS was 2 for wrong month and speech. Weakness noted on left without drift. No rrt interventions needed at present. Vital signs stable. Neuro checks to continue as ordered.     at          [place]

## 2015-10-03 NOTE — Progress Notes (Signed)
[facility] Progress Note  Assessment/Plan: 1. Acute to early subacute right frontoparietal subdural hematoma: AMS at HD unit. CT negative however MRI shows Small RIGHT frontal parietal subdural hematoma with intermediate to     bright T1, bright FLAIR signal measuring up to 6 mm with small amount suspected subarachnoid hemorrhage RIGHT frontal sulci. Per primary. Neurosurgery has seen patient, no intervention planned at present.  2. ESRD - TTS at [facility]. Had HD 12/27. K+ 3.5. Next HD 10/04/15-no heparin, 4.0 K bath.  3. Anemia - Hgb 8.3. has been on Mircera 200 Q2Weeks. Last dosed 12/15. Rec'd ESA last night with HD. Follow Hgb.  4. Secondary hyperparathyroidism - C Ca 10.44. Had been treated for hypocalemia, was changed to 2.0 Ca bath yesterday. Has been on hectoral 8 mcg IV q treatment. Hold for now.  5. HTN/volume -Had HD 10/02/15 at [facility]. Net UF -50 Post wt 74.4 kg. Was hypotensive during HD. Metoprolol dosage reduced to 25 mg PO BID.  HD 12/29 attempt 2 liters if BP will tolerate. 6. Nutrition - Renal diet/Fld restrictions. Albumin 3.2. Nepro/Renal Vits.  7. Metastatic Prostate Ca: Has been on lupron and bicalutamide. Seeing Dr. Luiz Ochoa in [place]. 8. PAF: rate controlled with metoprolol. No anticoagulants.    Rita H. Brown NP-C 10/03/2015, 11:29 AM  [facility] [phone]  Pt seen, examined and agree w A/P as above. Patient has R subdural hematoma, presented with AMS.  Marked atrophy also. Has known stage IV metastatic prostate cancer f/b Dr Bobby Rumpf in [place].  Alk phos is up to 5500 this admit, was around ~1000 at time of diagnosis around Sept/ Oct this year.  For dialysis tomorrow. He is losing weight and has sig leg edema, will need lowering of dry weight.  Kelly Splinter MD [facility] pager [phone]    cell [phone] 10/03/2015, 2:21 PM    Subjective: "What has happened to me?" Patient sitting on side of bed,  wife at bedside. Patient denies pain/headache/chest pain/SOB.   Objective Filed Vitals:   10/03/15 0447 10/03/15 0500 10/03/15 0555 10/03/15 1000  BP: 111/61 115/68 112/61 108/56  Pulse: 87 86 82 59  Temp: 97.6 F (36.4 C)  98.6 F (37 C) 99 F (37.2 C)  TempSrc: Oral  Oral Oral  Resp: '17 20 18 18  ' Height:      Weight: 74.4 kg (164 lb 0.4 oz)     SpO2: 100%  96% 98%   Physical Exam General: Well nourished elderly man in no acute distress Heart: Heart sounds distant, irregular. S1, S2, II/VI systolic murmur. Slight JVD. Afib on monitor.  Lungs: Bilateral breath sounds Abdomen: soft, nontender active BS Extremities: No LE edema.  Neuro: Alert, oriented to person, place. Speech slow clear. Mild RUE asterixis. Grips equal, face symmetrical. MAE X 4.  Dialysis Access: LFA AVF + thrill + bruit.   Dialysis Orders: TTS [facility] 2K, 2.25 Ca (just changed earlier today to 2Ca) 4 hours, 450/800 L AVF  EDW 72 kg Hectorol 9 mcg TIW Mircera 200 Q2weeks (last dosed 12/15)  Additional Objective Labs: Basic Metabolic Panel:  Recent Labs Lab 10/02/15 1314 10/02/15 2002 10/03/15 0655  NA 141  --  139  138  K 5.3*  --  3.5  3.5  CL 95*  --  96*  96*  CO2 30  --  28  28  GLUCOSE 101*  --  93  91  BUN 60*  --  24*  24*  CREATININE  9.48*  --  4.84*  4.84*  CALCIUM 10.6*  --  9.8  9.8  PHOS  --  3.6 3.2   Liver Function Tests:  Recent Labs Lab 10/02/15 1314 10/03/15 0655  AST 21 19  ALT 8* 7*  ALKPHOS 5968* 5535*  BILITOT 1.0 1.0  PROT 6.3* 5.3*  ALBUMIN 3.6 3.2*  3.2*   No results for input(s): LIPASE, AMYLASE in the last 168 hours. CBC:  Recent Labs Lab 10/02/15 1314 10/03/15 0655  WBC 6.0 4.3  NEUTROABS 4.0  --   HGB 9.3* 8.3*  HCT 30.8* 27.6*  MCV 98.4 96.5  PLT 129* 104*   Blood Culture    Component Value Date/Time   SDES BLOOD RIGHT WRIST 04/27/2015 0654   SPECREQUEST BOTTLES DRAWN AEROBIC AND ANAEROBIC 5CC 04/27/2015 0654   CULT NO  GROWTH 5 DAYS 04/27/2015 0654   REPTSTATUS 05/02/2015 FINAL 04/27/2015 0654    Cardiac Enzymes: No results for input(s): CKTOTAL, CKMB, CKMBINDEX, TROPONINI in the last 168 hours. CBG: No results for input(s): GLUCAP in the last 168 hours. Iron Studies: No results for input(s): IRON, TIBC, TRANSFERRIN, FERRITIN in the last 72 hours. '@lablastinr3' @ Studies/Results: Dg Chest 2 View  10/02/2015  CLINICAL DATA:  74 year old male with lethargy, disorientation and shortness of breath EXAM: CHEST  2 VIEW COMPARISON:  Prior chest x-ray 07/24/2015 FINDINGS: Cardiomegaly. Atherosclerotic and tortuous thoracic aorta. Pulmonary vascular congestion with mild interstitial edema. No focal airspace consolidation, large pleural effusion or pneumothorax. Stable right apical pleural parenchymal scarring. Irregular patchy multifocal regions of sclerosis throughout the visualized bones consistent with known widespread metastatic disease. IMPRESSION: No significant interval change in the appearance of the chest compared to prior imaging. Cardiomegaly and pulmonary vascular congestion without overt edema. Diffuse scattered sclerotic osseous metastatic disease. Electronically Signed   By: Jacqulynn Cadet M.D.   On: 10/02/2015 13:57   Ct Head Wo Contrast  10/02/2015  CLINICAL DATA:  Altered mental status/lethargy. End-stage renal disease. History of prostate carcinoma EXAM: CT HEAD WITHOUT CONTRAST TECHNIQUE: Contiguous axial images were obtained from the base of the skull through the vertex without intravenous contrast. COMPARISON:  None. FINDINGS: There is age related volume loss. There is no intracranial mass, hemorrhage, extra-axial fluid collection, or midline shift. There is no apparent intracranial edema. There is slight small vessel disease in the centra semiovale bilaterally. No acute infarct evident. There is extensive calcification in the middle cerebral arteries bilaterally. There is also calcification in both  distal vertebral arteries and in the basilar artery. The bony calvarium appears intact although somewhat sclerotic in a generalized manner. Mastoid air cells are clear. No intraorbital lesions are identified. IMPRESSION: Age related volume loss with mild periventricular small vessel disease. There is no intracranial mass or edema. No hemorrhage. No acute infarct evident. There is extensive arterial vascular calcification. Diffuse sclerosis of the calvarium is likely due to chronic renal failure. No well-defined metastatic foci are identified on this study. If intracranial neoplasm remains of concern, brain MRI would be advisable for further assessment. Electronically Signed   By: Lowella Grip III M.D.   On: 10/02/2015 16:07   Mr Brain Wo Contrast  10/02/2015  CLINICAL DATA:  Two day history of altered mental status. Somnolent. LEFT upper extremity twitching. History of end-stage renal disease on dialysis, metastatic prostate cancer, hypertension and hyperlipidemia. Evaluate acute encephalopathy. EXAM: MRI HEAD WITHOUT CONTRAST TECHNIQUE: Multiplanar, multiecho pulse sequences of the brain and surrounding structures were obtained without intravenous contrast. COMPARISON:  CT head  October 02, 2015 at 1557 hours FINDINGS: Moderate to severe ventriculomegaly on the basis of global parenchymal brain volume loss. Patchy supratentorial white matter T2 hyperintensities without midline shift, mass effect, mass lesions. No reduced diffusion to suggest acute ischemia. No susceptibility artifact to suggest intraparenchymal hemorrhage. Small RIGHT frontal parietal subdural hematoma with intermediate to bright T1, bright FLAIR signal measuring up to 6 mm with small amount suspected subarachnoid hemorrhage RIGHT frontal sulci. No extra-axial masses though, contrast enhanced sequences would be more sensitive. Dolicoectatic intracranial vascular flow voids at the skull base. Ocular globes and orbital contents are  unremarkable though not tailored for evaluation. No abnormal sellar expansion. Diffusely low bone marrow signal compatible with diffuse osseous metastasis as previously noted. Craniocervical junction maintained. Paranasal sinuses are well-aerated. Small bilateral mastoid effusions. IMPRESSION: Acute to early subacute RIGHT frontoparietal subdural hematoma measuring up to 6 mm, no mass effect or midline shift. Small amount of RIGHT frontal suspected subarachnoid hemorrhage. Moderate to severe global brain atrophy. Mild chronic small vessel ischemic disease. Dolicoectatic intracranial vessels compatible with chronic hypertension. These results will be called to the ordering clinician or representative by the Radiologist Assistant, and communication documented in the PACS or zVision Dashboard. Electronically Signed   By: Elon Alas M.D.   On: 10/02/2015 22:27   Medications:   . bicalutamide  50 mg Oral Daily  . Darbepoetin Alfa      . darbepoetin (ARANESP) injection - DIALYSIS  200 mcg Intravenous Q Tue-HD  . metoprolol  25 mg Oral BID  . pantoprazole  40 mg Oral Daily  . polyethylene glycol  17 g Oral Daily  . simvastatin  40 mg Oral QPM  . sodium chloride  3 mL Intravenous Q12H  . tamsulosin  0.4 mg Oral QHS

## 2015-10-03 NOTE — Progress Notes (Signed)
Patients wife noted the patient having some left sided weakness. VVS at 1353. Patient raised eyebrows symmetrically. Patient smiled, left side did not move. Patient raised both arms in the air. Left arm was slightly lower than right arm , however there was no drift. Called RRT for secondary opinion. NIH scale performed on patient by RRT and concluded code stroke not necessary. Will continue to monitor.

## 2015-10-03 NOTE — Progress Notes (Signed)
Patient assessed on return to unit patient alert and oriented times one now. Confusion more pronounced now patient not understanding simple direction. On Call [facility] notified stated that he would let the am doctor know.

## 2015-10-03 NOTE — Progress Notes (Signed)
Hemodialysis- bp low [id number] systolic, unrelieved with 250cc bolus of saline. Dr. Lorrene Reid notified. Order to give albumin and keep sbp>100. Continue to monitor.

## 2015-10-03 NOTE — Progress Notes (Signed)
PT Cancellation Note  Patient Details Name: UMAR HOOVLER MRN: [id number] DOB: Aug 09, 1941   Cancelled Treatment:    Reason Eval/Treat Not Completed: Medical issues which prohibited therapy Pt with subdural hematoma. More pronounced confusion this AM with difficulty following simple commands. Will hold comprehensive PT evaluation at this time. Please call for any questions. We will follow up again, likely tomorrow.   Ellouise Newer 10/03/2015, [place], [place]

## 2015-10-03 NOTE — Evaluation (Signed)
Occupational Therapy Evaluation Patient Details Name: Tim Kirby MRN: [id number] DOB: 10-10-1940 Today's Date: 10/03/2015    History of Present Illness Mr. Daffin is a 74 yo male with ESRD (TTS), metastatic prostate cancer to bone, PAH, pAF, HTN, HLD, and gout, presenting with 2 day history of AMS found to have a small right frontoparietal subdural hematoma measuring up to 6 mm.   Clinical Impression   Patient presenting with decreased ADL and functional mobility independence. Patient mod I PTA. Patient currently functioning at an overall min guard to min assist level. Patient will benefit from acute OT to increase overall independence in the areas of ADLs, functional mobility, and overall safety in order to safely discharge to venue listed below VS home.   Prior to entering room, discussed appropriateness for OT eval with RN. RN stated it was okay as long as we stayed in the room. Upon entering room, pt found seated on toilet seat in BR. Pt stated his wife assisted him into BR and that she would be back shortly. Therapist assisted patient to sink for grooming task of washing hands and then back to bed when he was finished. Patient oriented to self, situation, and place. Pt able to verbalize month with questioning cues, but stated it was they year "2011". With cues, pt able to state it was 2016 and said "Oh, yea! I should have known that, I have a 2016 car". Wife present in middle of session and assisted with answering home/PLOF interview questions. Discussed possible ST -SNF depending on patient's progress. Wife stated she would need to think about this. IF patient progresses and wife can provide the needed 24/7 supervision/assistance, pt may be appropriate to go home. Wife states she will be going back to work full time on 10/09/15.    Follow Up Recommendations  SNF;Supervision/Assistance - 24 hour    Equipment Recommendations  Other (comment) (TBD)    Recommendations for Other Services   None at this time    Precautions / Restrictions Precautions Precautions: Fall Restrictions Weight Bearing Restrictions: No    Mobility Bed Mobility Overal bed mobility: Needs Assistance Bed Mobility: Sit to Supine       Sit to supine: Supervision   General bed mobility comments: HOB elevated, supervision for safety  Transfers Overall transfer level: Needs assistance Equipment used: None Transfers: Sit to/from Stand Sit to Stand: Min guard General transfer comment: Min guard to steady and for safety    Balance Overall balance assessment: Needs assistance Sitting-balance support: No upper extremity supported;Feet supported Sitting balance-Leahy Scale: Good     Standing balance support: No upper extremity supported;During functional activity Standing balance-Leahy Scale: Fair (fair to poor) Standing balance comment: Pt used cane PTA, pt will need an AD for balance for his safety    ADL Overall ADL's : Needs assistance/impaired   Eating/Feeding Details (indicate cue type and reason): did not occur  Grooming: Min guard;Standing Grooming Details (indicate cue type and reason): at sink, no AD Upper Body Bathing: Set up;Sitting   Lower Body Bathing: Minimal assistance;Sit to/from stand   Upper Body Dressing : Set up;Sitting   Lower Body Dressing: Minimal assistance;Sit to/from stand   Toilet Transfer: Min guard;Grab bars;Ambulation;Comfort height toilet Toilet Transfer Details (indicate cue type and reason): no AD for ambulation Toileting- Clothing Manipulation and Hygiene: Supervision/safety;Sitting/lateral lean       Functional mobility during ADLs: Min guard;Cueing for safety General ADL Comments: No AD used for mobility.    Vision Additional Comments: to be  further tested          Pertinent Vitals/Pain Pain Assessment: No/denies pain     Hand Dominance Right   Extremity/Trunk Assessment Upper Extremity Assessment Upper Extremity Assessment:  Generalized weakness   Lower Extremity Assessment Lower Extremity Assessment: Defer to PT evaluation       Communication Communication Communication: No difficulties   Cognition Arousal/Alertness: Awake/alert Behavior During Therapy: WFL for tasks assessed/performed Overall Cognitive Status: Impaired/Different from baseline Area of Impairment: Orientation;Memory;Following commands;Safety/judgement;Awareness;Problem solving Orientation Level: Disoriented to;Time (pt able to state "December" with questioning cues, pt stated it was "2011")   Memory: Decreased short-term memory Following Commands: Follows one step commands with increased time Safety/Judgement: Decreased awareness of safety;Decreased awareness of deficits Awareness: Intellectual Problem Solving: Slow processing;Decreased initiation;Difficulty sequencing;Requires verbal cues;Requires tactile cues General Comments: Pt took increased time to answer questions. Pt did follow commands, but with increased time. Wife present during middle of session and reiterated questions/answers.               Home Living Family/patient expects to be discharged to:: Private residence Living Arrangements: Spouse/significant other;Other relatives Available Help at Discharge: Family;Available 24 hours/day (wife off until 1/3, wife works as a Pharmacist, hospital. Wife's mother is in house and can provide supervision) Type of Home: House Home Access: Stairs to enter [employer] of Steps: back door - 3 Entrance Stairs-Rails: None Home Layout: One level     Bathroom Shower/Tub: Tub/shower unit;Curtain   Biochemist, clinical: Standard     Home Equipment: Environmental consultant - 2 wheels;Cane - single point;Bedside commode   Additional Comments: uses RW when going to dialysis       Prior Functioning/Environment Level of Independence: Independent with assistive device(s);Needs assistance  Gait / Transfers Assistance Needed: pt walks with a cane or RW ADL's  / Homemaking Assistance Needed: wife does the homemaking, pt still drives and was independent with ADLs    OT Diagnosis: Generalized weakness   OT Problem List: Decreased strength;Decreased activity tolerance;Impaired balance (sitting and/or standing);Impaired vision/perception;Decreased safety awareness;Decreased knowledge of use of DME or AE;Decreased knowledge of precautions   OT Treatment/Interventions: Self-care/ADL training;Therapeutic exercise;Energy conservation;DME and/or AE instruction;Therapeutic activities;Patient/family education;Balance training    OT Goals(Current goals can be found in the care plan section) Acute Rehab OT Goals Patient Stated Goal: none stated OT Goal Formulation: With patient/family Time For Goal Achievement: 10/17/15 Potential to Achieve Goals: Good ADL Goals Pt Will Perform Grooming: with modified independence;standing Pt Will Perform Lower Body Bathing: with modified independence;sit to/from stand Pt Will Perform Lower Body Dressing: with modified independence;sit to/from stand Pt Will Transfer to Toilet: with modified independence;ambulating;bedside commode Additional ADL Goal #1: Pt will be mod I with functional mobility using LRAD  OT Frequency: Min 2X/week   Barriers to D/C: Patient's wife plans to go back to work 10/09/15   End of Session Equipment Utilized During Treatment: Gait belt Nurse Communication: Mobility status  Activity Tolerance: Patient tolerated treatment well Patient left: in bed;with call bell/phone within reach;with family/visitor present   Time: 1310-1340 OT Time Calculation (min): 30 min Charges:  OT General Charges $OT Visit: 1 Procedure OT Evaluation $Initial OT Evaluation Tier I: 1 Procedure OT Treatments $Self Care/Home Management : 8-22 mins  Chrys Racer , MS, OTR/L, CLT Pager: [phone]  10/03/2015, 2:12 PM

## 2015-10-03 NOTE — Progress Notes (Signed)
Hemodialysis- Pt brought to unit. Oriented x3, disoriented to time. Mumbling occasionally. Accessed via L AVF with 15g buttonhole needles. Continue to monitor.

## 2015-10-03 NOTE — Clinical Documentation Improvement (Signed)
Internal Medicine  Can the diagnosis of CHF be further specified?     Acuity - Acute, Chronic, Acute on Chronic   Type - Systolic, Diastolic, Systolic and Diastolic  Other  Clinically Undetermined  Document any associated diagnoses/conditions  Please update your documentation within the medical record to reflect your response to this query. Thank you.  Supporting Information: "CHF (congestive heart failure) ([place])"  Please exercise your independent, professional judgment when responding. A specific answer is not anticipated or expected.  Thank You, Alessandra Grout, RN, BSN, CCDS,Clinical Documentation Specialist:  [phone]  [phone]=Cell - Health Information Management

## 2015-10-03 NOTE — Discharge Summary (Signed)
Name: Tim Kirby MRN: [id number] DOB: 11-23-1940 74 y.o. PCP: Estill Bamberg. Megan Salon, MD  Date of Admission: 10/02/2015  2:32 PM Date of Discharge: 10/04/2015 Attending Physician: Annia Belt, MD  Discharge Diagnosis: 1. Acutely impaired executive functioning 2. 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 End-stage renal disease 4. Paroxysmal atrial fibrillation 5. Right frontal subdural hematoma  Discharge Medications:   Medication List    STOP taking these medications        aspirin 81 MG EC tablet     aspirin 81 MG tablet     morphine 15 MG tablet  Commonly known as:  MSIR      TAKE these medications        acetaminophen 325 MG tablet  Commonly known as:  TYLENOL  Take 1 tablet (325 mg total) by mouth every 4 (four) hours as needed for mild pain or moderate pain.     amLODipine 5 MG tablet  Commonly known as:  NORVASC  Take 5 mg by mouth daily.     bicalutamide 50 MG tablet  Commonly known as:  CASODEX  Take 1 tablet (50 mg total) by mouth daily.     calcium carbonate 500 MG chewable tablet  Commonly known as:  TUMS - dosed in mg elemental calcium  Chew 5 tablets (1,000 mg of elemental calcium total) by mouth 3 (three) times daily with meals.     feeding supplement (NEPRO CARB STEADY) Liqd  Take 237 mLs by mouth 2 (two) times daily between meals.     gabapentin 100 MG capsule  Commonly known as:  NEURONTIN  Take 1 capsule (100 mg total) by mouth 2 (two) times daily.     HYDROcodone-acetaminophen 5-325 MG tablet  Commonly known as:  NORCO/VICODIN  Take 1-2 tablets by mouth every 6 (six) hours as needed for severe pain (be aware this can make him drowsy so only take after tylenol. and limit total tylenol dose to 4000mg /day).     metoprolol 50 MG tablet  Commonly known as:  LOPRESSOR  Take 1 tablet (50 mg total) by mouth 2 (two) times daily.     multivitamin Tabs tablet  Take 1 tablet by mouth daily.     omeprazole 40 MG capsule    Commonly known as:  PRILOSEC  Take 40 mg by mouth daily.     polyethylene glycol packet  Commonly known as:  MIRALAX / GLYCOLAX  Take 17 g by mouth daily.     simvastatin 40 MG tablet  Commonly known as:  ZOCOR  Take 40 mg by mouth every evening.     tamsulosin 0.4 MG Caps capsule  Commonly known as:  FLOMAX  Take 0.4 mg by mouth at bedtime.        Disposition and follow-up:   Tim Kirby was discharged from [facility] in Good condition.  At the hospital follow up visit please address:  1.  Please get a head CT in 1 week to reassess his right frontal subdural hematoma  2. If his subdural hematoma is stable on repeat head CT, he can resume his aspirin 81 mg daily  3. Because his alkaline phosphatase was so elevated, he will need an outpatient bone scan sometime in the next few weeks to evaluate for progression of his metastases  4. Ensure he is only taking narcotics as needed for pain to prevent somnolence  Follow-up Appointments: Follow-up Information    Follow up with [facility].   Specialty:  Emergency Medicine   Why:  If symptoms worsen   Contact information:   [address] [id number] [place] [phone]      Follow up with [name], MD. Schedule an appointment as soon as possible for a visit in 1 month.   Specialty:  Internal Medicine   Why:  Hospital follow-up   Contact information:   [address] [place] [phone] [phone]       Consultations:  Treatment Team:  Roney Jaffe, MD  Procedures Performed:  Dg Chest 2 View  10/02/2015  CLINICAL DATA:  74 year old male with lethargy, disorientation and shortness of breath EXAM: CHEST  2 VIEW COMPARISON:  Prior chest x-ray 07/24/2015 FINDINGS: Cardiomegaly. Atherosclerotic and tortuous thoracic aorta. Pulmonary vascular congestion with mild interstitial edema. No focal  airspace consolidation, large pleural effusion or pneumothorax. Stable right apical pleural parenchymal scarring. Irregular patchy multifocal regions of sclerosis throughout the visualized bones consistent with known widespread metastatic disease. IMPRESSION: No significant interval change in the appearance of the chest compared to prior imaging. Cardiomegaly and pulmonary vascular congestion without overt edema. Diffuse scattered sclerotic osseous metastatic disease. Electronically Signed   By: Jacqulynn Cadet M.D.   On: 10/02/2015 13:57   Ct Head Wo Contrast  10/02/2015  CLINICAL DATA:  Altered mental status/lethargy. End-stage renal disease. History of prostate carcinoma EXAM: CT HEAD WITHOUT CONTRAST TECHNIQUE: Contiguous axial images were obtained from the base of the skull through the vertex without intravenous contrast. COMPARISON:  None. FINDINGS: There is age related volume loss. There is no intracranial mass, hemorrhage, extra-axial fluid collection, or midline shift. There is no apparent intracranial edema. There is slight small vessel disease in the centra semiovale bilaterally. No acute infarct evident. There is extensive calcification in the middle cerebral arteries bilaterally. There is also calcification in both distal vertebral arteries and in the basilar artery. The bony calvarium appears intact although somewhat sclerotic in a generalized manner. Mastoid air cells are clear. No intraorbital lesions are identified. IMPRESSION: Age related volume loss with mild periventricular small vessel disease. There is no intracranial mass or edema. No hemorrhage. No acute infarct evident. There is extensive arterial vascular calcification. Diffuse sclerosis of the calvarium is likely due to chronic renal failure. No well-defined metastatic foci are identified on this study. If intracranial neoplasm remains of concern, brain MRI would be advisable for further assessment. Electronically Signed   By:  Lowella Grip III M.D.   On: 10/02/2015 16:07   Mr Brain Wo Contrast  10/02/2015  CLINICAL DATA:  Two day history of altered mental status. Somnolent. LEFT upper extremity twitching. History of end-stage renal disease on dialysis, metastatic prostate cancer, hypertension and hyperlipidemia. Evaluate acute encephalopathy. EXAM: MRI HEAD WITHOUT CONTRAST TECHNIQUE: Multiplanar, multiecho pulse sequences of the brain and surrounding structures were obtained without intravenous contrast. COMPARISON:  CT head October 02, 2015 at 1557 hours FINDINGS: Moderate to severe ventriculomegaly on the basis of global parenchymal brain volume loss. Patchy supratentorial white matter T2 hyperintensities without midline shift, mass effect, mass lesions. No reduced diffusion to suggest acute ischemia. No susceptibility artifact to suggest intraparenchymal hemorrhage. Small RIGHT frontal parietal subdural hematoma with intermediate to bright T1, bright FLAIR signal measuring up to 6 mm with small amount suspected subarachnoid hemorrhage RIGHT frontal sulci. No extra-axial masses though, contrast enhanced sequences would be more sensitive. Dolicoectatic intracranial vascular flow voids at the skull base. Ocular globes and  orbital contents are unremarkable though not tailored for evaluation. No abnormal sellar expansion. Diffusely low bone marrow signal compatible with diffuse osseous metastasis as previously noted. Craniocervical junction maintained. Paranasal sinuses are well-aerated. Small bilateral mastoid effusions. IMPRESSION: Acute to early subacute RIGHT frontoparietal subdural hematoma measuring up to 6 mm, no mass effect or midline shift. Small amount of RIGHT frontal suspected subarachnoid hemorrhage. Moderate to severe global brain atrophy. Mild chronic small vessel ischemic disease. Dolicoectatic intracranial vessels compatible with chronic hypertension. These results will be called to the ordering clinician or  representative by the Radiologist Assistant, and communication documented in the PACS or zVision Dashboard. Electronically Signed   By: Elon Alas M.D.   On: 10/02/2015 22:27    Admission HPI: Tim Kirby is a 74 yo male with ESRD (TTS), metastatic prostate cancer to bone, PAH, pAF, HTN, HLD, and gout, presenting with 2 day history of AMS. History is obtained from the wife. She reports patient being near his normal baseline on 12/25, ambulating and conversant. However, on 12/26, patient was extremely somnolent, constantly falling asleep. He ate nothing during the day, and would fall asleep between bites at dinner. He was nonconversant, speaking in incomplete sentences, and was disoriented. Patient's wife also noticed a twitching of the LUE, described as a jerking motion. He was in a similar state when he went to dialysis today (12/27), and was sent to the ED from dialysis for AMS. His last dialysis session was Saturday (12/24). The patient has had some weight loss recently, and he will intermittently be nauseated, which is attributed to his cancer and chemotherapy. Patient's wife denies recent weakness, falls, facial droop, or dysarthria. He has never had a stroke. He received Gabapentin yesterday, but has not received it today. He has not received his Norco or Morphine yesterday or today. He does not drink alcohol or use illicit drugs. Patient's wife denies complaint of fever, chills, congestion, cough, dysphagia, CP, SOB, orthopnea, constipation, diarrhea, dysuria, hematuria, hematochezia, or melena.   He was diagnosed with metastatic prostate cancer in Oct 2016. To the wife's knowledge, it is only metastatic to bone. He is followed by Dr. Luiz Ochoa at [facility]. He is currently receiving Lupron and Bicalutamide. He has been on dialysis for several (~7) years 2/2 HTN.   In the ED, CT head was negative for mass, edema, hemorrhage, or acute infarct. BNP was elevated to  3200. Patient was maintaining 94% O2 on RA. ALP was elevated to 6000.   Hospital Course by problem list:   1. Acutely impaired executive functioning: He presented with a 2 day history of somnolence and decreased concentration. After a thorough workup, we believe his altered mental status was most likely from taking narcotithat were not being sufficiently renally cleared. He became much more lucid after holding his narcotics; we treated his mild pain with Tylenol. He did not have any symptoms nor signs of infection, his ionized calcium was normal, and there were no metastases to his brain.  2. 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 In September 2016, his PSA was 468, and alkaline phosphatase was 1500. This admission, his alkaline phosphatase had jumped to 6000, and PSA was 20.3. Thus, we questioned whether his bony metastatic burden was worsening while his PSA remained low; our thought process being that he could have both PSA-sensitive and -resistant clonal tumor cells and his bony  metastases could be progressing, and these were not producing PSA. We ordered a bone scan, but unfortunately this was not done inpatient. We recommend getting an outpatient bone scan to see whether his bony metastatic burden is worsening.  3. End-stage renal disease: He gets dialysis on Tuesdays, Thursdays, and Saturdays, and got dialysis while inpatient. On admission, his BUN was 60, and he did not have asterixis on exam, so we doubted that uremia was the cause of his somnolence.  4. Paroxysmal atrial fibrillation: His rates were well controlled while inpatient, and we continued his home dose of metoprolol 25 mg twice daily.  5. Right frontal subdural hematoma: We obtained a brain MRI to look for brain metastases. There was an incidental 6 mm right frontal subdural hematoma without midline shift.  Neurosurgery was consult, who did not believe he needed surgical intervention. They recommended a repeat head CT in 1 week (January 4).  Discharge Vitals:   BP 133/79 mmHg  Pulse 91  Temp(Src) 98.2 F (36.8 C) (Oral)  Resp 16  Ht 5\' 8"  (1.727 m)  Wt 74.2 kg (163 lb 9.3 oz)  BMI 24.88 kg/m2  SpO2 98%  Discharge Labs:  Results for orders placed or performed during the hospital encounter of 10/02/15 (from the past 24 hour(s))  PSA     Status: Abnormal   Collection Time: 10/03/15 12:35 PM  Result Value Ref Range   PSA 23.05 (H) 0.00 - 4.00 ng/mL  Glucose, capillary     Status: None   Collection Time: 10/04/15  7:17 AM  Result Value Ref Range   Glucose-Capillary 78 65 - 99 mg/dL  Renal function panel     Status: Abnormal   Collection Time: 10/04/15  7:58 AM  Result Value Ref Range   Sodium 136 135 - 145 mmol/L   Potassium 3.8 3.5 - 5.1 mmol/L   Chloride 96 (L) 101 - 111 mmol/L   CO2 28 22 - 32 mmol/L   Glucose, Bld 103 (H) 65 - 99 mg/dL   BUN 37 (H) 6 - 20 mg/dL   Creatinine, Ser 6.94 (H) 0.61 - 1.24 mg/dL   Calcium 9.5 8.9 - 10.3 mg/dL   Phosphorus 3.5 2.5 - 4.6 mg/dL   Albumin 3.1 (L) 3.5 - 5.0 g/dL   GFR calc non Af Amer 7 (L) >60 mL/min   GFR calc Af Amer 8 (L) >60 mL/min   Anion gap 12 5 - 15  CBC     Status: Abnormal   Collection Time: 10/04/15  7:59 AM  Result Value Ref Range   WBC 4.3 4.0 - 10.5 K/uL   RBC 3.01 (L) 4.22 - 5.81 MIL/uL   Hemoglobin 8.9 (L) 13.0 - 17.0 g/dL   HCT 28.7 (L) 39.0 - 52.0 %   MCV 95.3 78.0 - 100.0 fL   MCH 29.6 26.0 - 34.0 pg   MCHC 31.0 30.0 - 36.0 g/dL   RDW 18.2 (H) 11.5 - 15.5 %   Platelets 106 (L) 150 - 400 K/uL    Signed: Loleta Chance, MD 10/04/2015, 10:59 AM   Medicine attending discharge note: 74 year old man with hypertension, paroxysmal atrial fibrillation, and end-stage renal disease on dialysis who was recently diagnosed with prostate cancer metastatic to bone in September of this year. He was started on hormonal  therapy with Lupron plus bicalutamide. Main oncologist following him is Dr. Lavera Guise in [place]. Baseline data from 04/27/2015 showed an alkaline phosphatase of 1478. PSA of 494 on 06/14/2015 without alkaline phosphatase 1072.  He now presents with a 2 day history of change in mental status with increasing somnolence, poor by mouth intake, disorientation, intermittent jerking motions of his left upper extremity. No prior history of a seizure disorder. He does have two narcotic prescriptions, one for oxycodone/ acetaminophen and another for short acting morphine. It is not clear how frequently he takes these medications. He has not taken any narcotics for the last 24 hours.  Preliminary evaluation showed no mass lesions on CT or MRI to suggest metastatic disease. A 6 mm subdural hematoma noted on MRI in the right frontal parietal region with small amount of suspected subarachnoid hemorrhage in the right frontal sulci. No mass effect or midline shift. There was no history of head trauma or a fall. There was moderate to severe global brain atrophy and ventriculomegaly Calcium borderline elevated at 10.6 with albumin 3.6 with a normal ionized calcium of 5.0. BNP elevated 3380 compared with 1518 in July 2016. Initial troponin 0.03. Alkaline phosphatase markedly elevated compared with recent value jumping from [place] in October To 5968.  Hospital course: Neurosurgery revealed the MRI images and did not feel that surgical intervention was indicated. Systematic exclusion of a potential reasons for encephalopathy ensued: Mental status changes limited to executive functions with mental slowness, inability to do simple calculations, or to concentrate (to be able to spell a 3 letter word backwards). No other focal motor or cerebellar deficits. No evidence for active infection with no fever and no elevation of white count. No evidence for uremia with BUN 60 No major metabolic abnormalities on chemistry other  than those known to be associated with his end-stage renal disease. Calcium was borderline elevated at 10.6 with a low normal albumin. Ionized calcium was normal. He was not hypoxic. By process of elimination, it seemed the most likely etiology of his mental status changes and intermittent jerking movements of his arm was related to use of narcotic analgesics in someone with advanced renal failure. Outpatient morphine was held. Mental status improved significantly. He was dialyzed on the day of discharge.  The patient has been on hormonal therapy with Lupron and bicalutamide for his prostate cancer We were concerned with the marked elevation of his alkaline phosphatase. We made a call to his treating oncologist. He stated that as recently as November, the patient's PSA had fallen markedly down into the 20 range from the 494 value recorded here on September 8. We repeated a PSA and in fact on December 28 it was low at 23.05. We plan to do a bone scan. He was injected for the bone scan but due to scheduling error, he had dialysis right after the injection which invalidated the study is that he was not actually scanned. We will defer further imaging to the outpatient setting. My concern as indicated above by Dr. Melburn Hake is that he has a hormone sensitive clone of prostate cancer cells to explain the fall in PSA but a hormone resistant clone to explain progressive rise in his alkaline phosphatase which presumably reflects progressive bone disease.  Disposition: Condition stable at time of discharge. Mental status significantly improved. He should avoid any long-acting narcotic preparations with limited use of short-acting agents. He will follow-up with his oncologist Dr. Bobby Rumpf in [place] and his internist Dr. Jenean Lindau. He will continue outpatient dialysis in [place]. Complications: Injection for bone scan. Study not done since injection given right before a dialysis treatment.

## 2015-10-03 NOTE — Evaluation (Signed)
Clinical/Bedside Swallow Evaluation Patient Details  Name: Tim Kirby MRN: [id number] Date of Birth: 1940-11-19  Today's Date: 10/03/2015 Time: SLP Start Time (ACUTE ONLY): 1500 SLP Stop Time (ACUTE ONLY): 1525 SLP Time Calculation (min) (ACUTE ONLY): 25 min  Past Medical History:  Past Medical History  Diagnosis Date  . Hyperlipidemia   . Gout   . Secondary hyperparathyroidism ([place])   . Anemia   . Colon, diverticulosis   . Thyroid nodule   . Anginal pain ([place])     sees Dr. Lia Foyer  . Hypertension     sees Dr. Willy Eddy  . 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 Upper respiratory infection April 2016  . CHF (congestive heart failure) ([place])   . 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 ESRD (end stage renal disease) on dialysis ([place])     "TTS; [place]" (06/15/2015)  . Renal insufficiency   . Cancer [facility])    Past Surgical History:  Past Surgical History  Procedure Laterality Date  . Knee surgery Left     cyst removal by Dr. Lorin Mercy  . Av fistula placement Left     left UA AVF  . Femoral-popliteal bypass graft  10/18/2012    Procedure: BYPASS GRAFT FEMORAL-POPLITEAL ARTERY;  Surgeon: Conrad Atka, MD;  Location: [facility] OR;  Service: Vascular;  Laterality: Right;  Right Popliteal Aneurysm Exclusion; Ultrasound guided  . False aneurysm repair Left 12/29/2012    Procedure: REPAIR FALSE ANEURYSM;  Surgeon: Conrad Midvale, MD;  Location: [place];  Service: Vascular;  Laterality: Left;  . Endovascular repair of popliteal artery aneurysm Left 12/29/2012    Procedure: ENDOVASCULAR REPAIR OF POPLITEAL AND FEMORAL ARTERY ANEURYSM;  Surgeon: Conrad San [place]acia, MD;  Location: [place];  Service: Vascular;  Laterality: Left;  . Abdominal aortagram N/A 09/27/2012    Procedure: ABDOMINAL Maxcine Ham;  Surgeon: Angelia Mould, MD;  Location: [facility] CATH LAB;  Service: Cardiovascular;  Laterality: N/A;  . Tonsillectomy and adenoidectomy     HPI:  74 year old man with hypertension, paroxysmal atrial  fibrillation, and end-stage renal disease on dialysis who was recently diagnosed with prostate cancer metastatic to bone in September of this year. Presents with a 2 day history of change in mental status with increasing somnolence, poor by mouth intake, disorientation, intermittent jerking motions of his left upper extremity with   Assessment / Plan / Recommendation Clinical Impression  Patient seen for BSE. Patient was awake and alert and patient's wife reports this is the best cognitively he has been since admission. Patient's oral-motor exam appeared [facility] although patient demonstrated intermittent difficulty following directions. Patient consumed thin liquids via straw and utilized multiple swallows without overt s/s of aspiration. Patient also consumed solid textures with mildly prolonged mastication without oral residue or overt s/s of aspiration. Suspect mildly prolonged mastication is due to distractibility and was reduced with environmental modifications.  Recommend patient continue current diet of regular textures with thin liquids. SLP will f/u briefly for tolerance, especially with fluctuations in mentation. Patient's wife present and verbalized understanding.     Aspiration Risk  Mild aspiration risk    Diet Recommendation Thin liquid;Regular (Renal )   Liquid Administration via: Cup;Straw Medication Administration: Whole meds with liquid Supervision: Intermittent supervision to cue for compensatory strategies;Patient able to self feed Compensations: Minimize environmental distractions;Small sips/bites Postural Changes: Seated upright at 90 degrees    Other  Recommendations Oral Care Recommendations: Oral care BID   Follow up Recommendations  24 hour supervision/assistance  Frequency and Duration min 2x/week  1 week       Prognosis Prognosis for Safe Diet Advancement: Good Barriers to Reach Goals: Cognitive deficits      Swallow Study   General Date of Onset:  10/02/15 HPI: 74 year old man with hypertension, paroxysmal atrial fibrillation, and end-stage renal disease on dialysis who was recently diagnosed with prostate cancer metastatic to bone in September of this year. Presents with a 2 day history of change in mental status with increasing somnolence, poor by mouth intake, disorientation, intermittent jerking motions of his left upper extremity with Type of Study: Bedside Swallow Evaluation Previous Swallow Assessment: N/A Diet Prior to this Study: Regular;Thin liquids Temperature Spikes Noted: No Respiratory Status: Room air History of Recent Intubation: No Behavior/Cognition: Alert;Cooperative;Distractible;Requires cueing Oral Cavity Assessment: Within Functional Limits Oral Care Completed by SLP: No Oral Cavity - Dentition: Adequate natural dentition Vision: Functional for self-feeding Self-Feeding Abilities: Able to feed self Patient Positioning: Upright in bed Baseline Vocal Quality: Normal Volitional Cough: Strong Volitional Swallow: Able to elicit    Oral/Motor/Sensory Function Overall Oral Motor/Sensory Function: Within functional limits (trace left facial droop? )   Ice Chips Ice chips: Not tested   Thin Liquid Thin Liquid: Impaired Presentation: Self Fed;Straw Pharyngeal  Phase Impairments: Multiple swallows (suspect due to large, sequential sips )    Nectar Thick Nectar Thick Liquid: Not tested   Honey Thick Honey Thick Liquid: Not tested   Puree Puree: Within functional limits Presentation: Self Fed;Spoon   Solid Solid: Impaired Presentation: Self Fed;Spoon Oral Phase Impairments: Impaired mastication (mildly prolonged )       Tim Kirby 10/03/2015,3:38 PM

## 2015-10-04 DIAGNOSIS — C7951 Secondary malignant neoplasm of bone: Secondary | ICD-10-CM | POA: Insufficient documentation

## 2015-10-04 LAB — CBC
HCT: 28.7 % — ABNORMAL LOW (ref 39.0–52.0)
Hemoglobin: 8.9 g/dL — ABNORMAL LOW (ref 13.0–17.0)
MCH: 29.6 pg (ref 26.0–34.0)
MCHC: 31 g/dL (ref 30.0–36.0)
MCV: 95.3 fL (ref 78.0–100.0)
PLATELETS: 106 10*3/uL — AB (ref 150–400)
RBC: 3.01 MIL/uL — ABNORMAL LOW (ref 4.22–5.81)
RDW: 18.2 % — AB (ref 11.5–15.5)
WBC: 4.3 10*3/uL (ref 4.0–10.5)

## 2015-10-04 LAB — RENAL FUNCTION PANEL
ALBUMIN: 3.1 g/dL — AB (ref 3.5–5.0)
ANION GAP: 12 (ref 5–15)
BUN: 37 mg/dL — AB (ref 6–20)
CALCIUM: 9.5 mg/dL (ref 8.9–10.3)
CO2: 28 mmol/L (ref 22–32)
Chloride: 96 mmol/L — ABNORMAL LOW (ref 101–111)
Creatinine, Ser: 6.94 mg/dL — ABNORMAL HIGH (ref 0.61–1.24)
GFR, EST AFRICAN AMERICAN: 8 mL/min — AB (ref >60)
GFR, EST NON AFRICAN AMERICAN: 7 mL/min — AB (ref >60)
GLUCOSE: 103 mg/dL — AB (ref 65–99)
PHOSPHORUS: 3.5 mg/dL (ref 2.5–4.6)
Potassium: 3.8 mmol/L (ref 3.5–5.1)
SODIUM: 136 mmol/L (ref 135–145)

## 2015-10-04 LAB — GLUCOSE, CAPILLARY
GLUCOSE-CAPILLARY: 79 mg/dL (ref 65–99)
GLUCOSE-CAPILLARY: 113 mg/dL — AB (ref 65–99)
Glucose-Capillary: 78 mg/dL (ref 65–99)

## 2015-10-04 MED ORDER — ACETAMINOPHEN 325 MG PO TABS: 325.0000 mg | ORAL_TABLET | ORAL | Status: DC | PRN

## 2015-10-04 MED ORDER — LIDOCAINE-PRILOCAINE 2.5-2.5 % EX CREA: 1.0000 "application " | TOPICAL_CREAM | CUTANEOUS | Status: DC | PRN

## 2015-10-04 MED ORDER — SODIUM CHLORIDE 0.9 % IV SOLN: 100.0000 mL | INTRAVENOUS | Status: DC | PRN

## 2015-10-04 MED ORDER — PENTAFLUOROPROP-TETRAFLUOROETH EX AERO: 1.0000 "application " | INHALATION_SPRAY | CUTANEOUS | Status: DC | PRN

## 2015-10-04 MED ORDER — NEPRO/CARBSTEADY PO LIQD: 237.0000 mL | Freq: Two times a day (BID) | ORAL | Status: DC

## 2015-10-04 MED ORDER — HYDROCODONE-ACETAMINOPHEN 5-325 MG PO TABS: 1.0000 | ORAL_TABLET | Freq: Four times a day (QID) | ORAL | Status: DC | PRN

## 2015-10-04 MED ORDER — LIDOCAINE HCL (PF) 1 % IJ SOLN: 5.0000 mL | INTRAMUSCULAR | Status: DC | PRN

## 2015-10-04 NOTE — Evaluation (Signed)
Physical Therapy Evaluation Patient Details Name: Tim Kirby MRN: [id number] DOB: August 22, 1941 Today's Date: 10/04/2015   History of Present Illness  Tim Kirby is a 74 yo male with ESRD (TTS), metastatic prostate cancer to bone, PAH, pAF, HTN, HLD, and gout, presenting with 2 day history of AMS found to have a small right frontoparietal subdural hematoma measuring up to 6 mm.  Clinical Impression  Pt admitted with the above complications. Pt currently with functional limitations due to the deficits listed below (see PT Problem List). Ambulates with intermittent instability without an assistive device, greatly improved with a rolling walker. Did not require physical assist for mobility or higher level dynamic gait tasks. There is definitely a cognitive deficit present however wife states this is quickly improving towards his baseline. She also reports that family can actually provide 24/7 assistance at home for supervision with tasks. Pt will benefit from skilled PT to increase their independence and safety with mobility to allow discharge to the venue listed below.       Follow Up Recommendations Other (comment) Patient does not need physical assist for gait. Recommend 24/7 supervision based on cognitive difficulties which appear to be improving. Family can provide 24/7 assist until tues 10/09/15. After which, the wife returns to work but a brother can come to town for supervision if needed. HHPT would be appropriate to address home environment and pts functional abilities in this setting. If family does not feel they can adequately care for patient at home, then consider ST-SNF.    Equipment Recommendations  None recommended by PT    Recommendations for Other Services       Precautions / Restrictions Precautions Precautions: Fall Restrictions Weight Bearing Restrictions: No      Mobility  Bed Mobility Overal bed mobility: Needs Assistance Bed Mobility: Supine to Sit;Sit to  Supine     Supine to sit: Supervision Sit to supine: Supervision   General bed mobility comments: supervision for safety. HOB flat. no physical assist, extra time only.  Transfers Overall transfer level: Needs assistance Equipment used: Rolling walker (2 wheeled) Transfers: Sit to/from Stand Sit to Stand: Supervision         General transfer comment: supervision for safety. VC for hand placmement. Good stability once upright.  Ambulation/Gait Ambulation/Gait assistance: Min guard Ambulation Distance (Feet): 175 Feet Assistive device: Rolling walker (2 wheeled);None Gait Pattern/deviations: Step-through pattern;Decreased stride length;Staggering left;Drifts right/left;Trunk flexed;Trendelenburg Gait velocity: decreased Gait velocity interpretation: Below normal speed for age/gender General Gait Details: Initially ambulating with RW, demonstrating good stability and control with this device, cues for forward gaze and posture, no overt loss of balance noted. Without RW, pt did tolerate dynamic gait tasks with some difficulty including, high marching, vertical/horizontal head turns, variable speeds, and backwards stepping. On occasion would stagger but was able to self correct. Pt and wife both state pt ambulating at baseline ability. SpO2 93% on room air.  Stairs            Wheelchair Mobility    Modified Rankin (Stroke Patients Only)       Balance Overall balance assessment: Needs assistance;History of Falls Sitting-balance support: No upper extremity supported;Feet supported Sitting balance-Leahy Scale: Good     Standing balance support: No upper extremity supported Standing balance-Leahy Scale: Good                               Pertinent Vitals/Pain Pain Assessment: No/denies pain  Home Living Family/patient expects to be discharged to:: Private residence Living Arrangements: Spouse/significant other;Other relatives Available Help at  Discharge: Family;Available 24 hours/day (wife off until 1/3, wife works as a Pharmacist, hospital. Wife's mother is in house and can provide supervision) Type of Home: House Home Access: Stairs to enter Entrance Stairs-Rails: None Entrance Stairs-Number of Steps: back door - 3 Home Layout: One level Home Equipment: [place] - 2 wheels;Cane - single point;Bedside commode Additional Comments: uses RW when going to dialysis and cane in home    Prior Function Level of Independence: Independent with assistive device(s)   Gait / Transfers Assistance Needed: pt walks with a cane or RW  ADL's / Homemaking Assistance Needed: wife does the homemaking, pt still drives and was independent with ADLs        Hand Dominance   Dominant Hand: Right    Extremity/Trunk Assessment   Upper Extremity Assessment: Defer to OT evaluation           Lower Extremity Assessment: Generalized weakness      Cervical / Trunk Assessment: Normal  Communication   Communication: No difficulties  Cognition Arousal/Alertness: Awake/alert Behavior During Therapy: WFL for tasks assessed/performed Overall Cognitive Status: Impaired/Different from baseline Area of Impairment: Orientation;Memory;Attention;Safety/judgement;Awareness;Problem solving Orientation Level: Disoriented to;Time;Situation Current Attention Level: Selective Memory: Decreased short-term memory Following Commands: Follows one step commands with increased time Safety/Judgement: Decreased awareness of safety;Decreased awareness of deficits   Problem Solving: Slow processing;Decreased initiation;Difficulty sequencing;Requires verbal cues General Comments: Pt states it's March.  Required mod verbal cues to problem solve that Christams is in December.   Required min verbal cues for sequencing during simple grooming activities.  Impulsivity note     General Comments General comments (skin integrity, edema, etc.): Spoke with wife and brother concerning d/c  planning. wife states family can provide 24/7 assist although her mother is older, she is mobile throughout the household. Wife will be present until she goes back to work tues  10/09/15. She also states her brother can come to provide additonal assist if needed after she returns to work.    Exercises        Assessment/Plan    PT Assessment Patient needs continued PT services  PT Diagnosis Abnormality of gait;Generalized weakness;Altered mental status   PT Problem List Decreased strength;Decreased balance;Decreased mobility;Decreased cognition;Decreased safety awareness  PT Treatment Interventions DME instruction;Gait training;Stair training;Functional mobility training;Therapeutic activities;Therapeutic exercise;Balance training;Cognitive remediation;Patient/family education   PT Goals (Current goals can be found in the Care Plan section) Acute Rehab PT Goals Patient Stated Goal: Go home today PT Goal Formulation: With patient Time For Goal Achievement: 10/18/15 Potential to Achieve Goals: Good    Frequency Min 3X/week   Barriers to discharge Decreased caregiver support wife works, mother-in-law is elderly but functional    Co-evaluation               End of Session   Activity Tolerance: Patient tolerated treatment well Patient left: in bed;with call bell/phone within reach;with bed alarm set;with family/visitor present Nurse Communication: Mobility status         Time: [id number] PT Time Calculation (min) (ACUTE ONLY): 17 min   Charges:   PT Evaluation $Initial PT Evaluation Tier I: 1 Procedure     PT G CodesEllouise Newer 10/04/2015, 4:55 PM  Camille Bal [place], [place]

## 2015-10-04 NOTE — Progress Notes (Addendum)
Occupational Therapy Treatment Patient Details Name: Tim Kirby MRN: [id number] DOB: 09-29-1941 Today's Date: 10/04/2015    History of present illness Mr. Polmanteer is a 74 yo male with ESRD (TTS), metastatic prostate cancer to bone, PAH, pAF, HTN, HLD, and gout, presenting with 2 day history of AMS found to have a small right frontoparietal subdural hematoma measuring up to 6 mm.   OT comments  Pt requires min guard to min A for ADLs.  He is disoriented to time and situation, and requires cues for safety and sequencing.  He will need 24 hour supervision at discharge.  Wife works and is unable to provide necessary assist.  Recommend SNF.  Addendum:  Per PT discussion with wife, she is able to arrange for pt to have 24/7 supervision, therefore recommendation changed to [place].   Follow Up Recommendations  SNF;Supervision/Assistance - 24 hour - HHOT if family able to provide 24 hour supervision.    Equipment Recommendations       Recommendations for Other Services      Precautions / Restrictions Precautions Precautions: Fall       Mobility Bed Mobility Overal bed mobility: Needs Assistance Bed Mobility: Sit to Supine       Sit to supine: Supervision      Transfers Overall transfer level: Needs assistance Equipment used: None Transfers: Sit to/from Stand;Stand Pivot Transfers Sit to Stand: Min guard         General transfer comment: min guard for safety     Balance Overall balance assessment: Needs assistance Sitting-balance support: No upper extremity supported Sitting balance-Leahy Scale: Good     Standing balance support: Bilateral upper extremity supported Standing balance-Leahy Scale: Fair                     ADL Overall ADL's : Needs assistance/impaired     Grooming: Oral care;Set up;Min guard;Standing Grooming Details (indicate cue type and reason): requires min verbal cues for sequencing                  Toilet Transfer: Min  guard;RW   Toileting- Clothing Manipulation and Hygiene: Min guard;Sit to/from stand       Functional mobility during ADLs: Min guard;Rolling walker General ADL Comments: wife present throughout eval.  She works during the day       [place] During Therapy: [facility] for tasks assessed/performed Overall Cognitive Status: Impaired/Different from baseline Area of Impairment: Orientation;Memory;Attention;Safety/judgement;Awareness;Problem solving Orientation Level: Disoriented to;Time;Situation Current Attention Level: Selective Memory: Decreased short-term memory  Following Commands: Follows one step commands with increased time Safety/Judgement: Decreased awareness of safety;Decreased awareness of deficits   Problem Solving: Slow processing;Decreased initiation;Difficulty sequencing;Requires verbal cues;Requires tactile cues General Comments: Pt states it's March.  Required mod verbal cues to problem solve that Christams is in December.   Required min verbal cues for sequencing during simple grooming activities.  Impulsivity note     Extremity/Trunk Assessment               Exercises     Shoulder Instructions       General Comments      Pertinent Vitals/ Pain       Pain Assessment: No/denies pain  Home Living  Prior Functioning/Environment              Frequency Min 2X/week     Progress Toward Goals  OT Goals(current goals can now be found in the care plan section)     ADL Goals Pt Will Perform Grooming: with modified independence;standing Pt Will Perform Lower Body Bathing: with modified independence;sit to/from stand Pt Will Perform Lower Body Dressing: with modified independence;sit to/from stand Pt Will Transfer to Toilet: with modified independence;ambulating;bedside commode Additional ADL Goal #1: Pt will be mod I  with functional mobility using LRAD  Plan Discharge plan remains appropriate    Co-evaluation                 End of Session Equipment Utilized During Treatment: Rolling walker   Activity Tolerance Patient tolerated treatment well   Patient Left in bed;with call bell/phone within reach;with bed alarm set;with family/visitor present   Nurse Communication Mobility status        Time: [id number] OT Time Calculation (min): 30 min  Charges: OT General Charges $OT Visit: 1 Procedure OT Treatments $Self Care/Home Management : 8-22 mins $Therapeutic Activity: 8-22 mins  Aleah Ahlgrim M 10/04/2015, 2:48 PM

## 2015-10-04 NOTE — Progress Notes (Signed)
Called Dr for  VTE prophylaxis and the per him the heparin is given at the hemodialysis.

## 2015-10-04 NOTE — Progress Notes (Signed)
[facility] Progress Note  Assessment/Plan: 1. Acute to early subacute right frontoparietal subdural hematoma: AMS at HD unit. CT negative however MRI shows Small RIGHT frontal parietal subdural hematoma with intermediate to  bright T1, bright FLAIR signal measuring up to 6 mm with small amount suspected subarachnoid hemorrhage RIGHT frontal sulci. Per primary. Neurosurgery has seen patient, no intervention planned at present. Repeat CT i      in one week to evaluate. 2. ESRD - TTS at [facility]. On HD at present, tolerating well. K+ 3.8-on 4.0 K bath.  3. Anemia - Hgb 8.9. ESA given 10/03/15. Follow Hgb.  4. Secondary hyperparathyroidism - C Ca 10.22 today.  Changed to 2.0 Ca bath yesterday. Has been on hectoral 8 mcg IV q treatment. Hold for now.  5. HTN/volume -On HD now. UFG decreased to 3500. SBP 118-120s.  6. Nutrition - Renal diet/Fld restrictions. Albumin 3.1 Nepro/Renal Vits.  7. Metastatic Prostate Ca: Has been on lupron and bicalutamide. Seeing Dr. Luiz Ochoa in [place].  8. PAF: rate controlled with metoprolol. No anticoagulants.   Rita H. Brown NP-C 10/04/2015, 8:52 AM  [facility] [phone]  Pt seen, examined and agree w A/P as above.  Kelly Splinter MD [facility] pager [phone]    cell [phone] 10/04/2015, 1:24 PM   Subjective: "I feel pretty good". Seen on HD, no complaints of headache, SOB.   Objective Filed Vitals:   10/04/15 0500 10/04/15 0734 10/04/15 0800 10/04/15 0830  BP: 133/64 130/62 120/69 121/84  Pulse: 88 85 88 77  Temp: 98.1 F (36.7 C) 98.2 F (36.8 C)    TempSrc: Oral Oral    Resp: 16 15 16 15   Height:      Weight:  74.2 kg (163 lb 9.3 oz)    SpO2: 98% 98%     Physical Exam General: Well nourished elderly man in no acute distress Heart: Heart sounds distant, irregular. S1, S2, II/VI systolic murmur. Slight JVD. Afib on monitor.  Lungs: Bilateral breath sounds CTA  A/P Abdomen: soft, nontender active BS Extremities: No LE edema.  Neuro: Alert, oriented to person, place. Speech slow clear.  Grips equal, slight L facial droop, PERRLA. Follows commands.  Dialysis Access: LFA AVF + thrill + bruit.   Dialysis Orders: TTS [facility] 2K, 2.25 Ca (just changed earlier today to 2Ca) 4 hours, 450/800 L AVF  EDW 72 kg Hectorol 9 mcg TIW Mircera 200 Q2weeks (last dosed 12/15) Additional Objective Labs: Basic Metabolic Panel:  Recent Labs Lab 10/02/15 1314 10/02/15 2002 10/03/15 0655 10/04/15 0758  NA 141  --  139  138 136  K 5.3*  --  3.5  3.5 3.8  CL 95*  --  96*  96* 96*  CO2 30  --  28  28 28   GLUCOSE 101*  --  93  91 103*  BUN 60*  --  24*  24* 37*  CREATININE 9.48*  --  4.84*  4.84* 6.94*  CALCIUM 10.6*  --  9.8  9.8 9.5  PHOS  --  3.6 3.2 3.5   Liver Function Tests:  Recent Labs Lab 10/02/15 1314 10/03/15 0655 10/04/15 0758  AST 21 19  --   ALT 8* 7*  --   ALKPHOS 5968* 5535*  --   BILITOT 1.0 1.0  --   PROT 6.3* 5.3*  --   ALBUMIN 3.6 3.2*  3.2* 3.1*   No results for input(s): LIPASE, AMYLASE in the last 168 hours. CBC:  Recent Labs Lab 10/02/15 1314 10/03/15 0655 10/04/15 0759  WBC 6.0 4.3 4.3  NEUTROABS 4.0  --   --   HGB 9.3* 8.3* 8.9*  HCT 30.8* 27.6* 28.7*  MCV 98.4 96.5 95.3  PLT 129* 104* 106*   Blood Culture    Component Value Date/Time   SDES BLOOD RIGHT ANTECUBITAL 10/02/2015 1456   SPECREQUEST BOTTLES DRAWN AEROBIC AND ANAEROBIC 5CC 10/02/2015 1456   CULT NO GROWTH < 24 HOURS 10/02/2015 1456   REPTSTATUS PENDING 10/02/2015 1456    Cardiac Enzymes: No results for input(s): CKTOTAL, CKMB, CKMBINDEX, TROPONINI in the last 168 hours. CBG:  Recent Labs Lab 10/04/15 0717  GLUCAP 78   Iron Studies: No results for input(s): IRON, TIBC, TRANSFERRIN, FERRITIN in the last 72 hours. @lablastinr3 @ Studies/Results: Dg Chest 2 View  10/02/2015  CLINICAL DATA:  74 year old male with  lethargy, disorientation and shortness of breath EXAM: CHEST  2 VIEW COMPARISON:  Prior chest x-ray 07/24/2015 FINDINGS: Cardiomegaly. Atherosclerotic and tortuous thoracic aorta. Pulmonary vascular congestion with mild interstitial edema. No focal airspace consolidation, large pleural effusion or pneumothorax. Stable right apical pleural parenchymal scarring. Irregular patchy multifocal regions of sclerosis throughout the visualized bones consistent with known widespread metastatic disease. IMPRESSION: No significant interval change in the appearance of the chest compared to prior imaging. Cardiomegaly and pulmonary vascular congestion without overt edema. Diffuse scattered sclerotic osseous metastatic disease. Electronically Signed   By: Jacqulynn Cadet M.D.   On: 10/02/2015 13:57   Ct Head Wo Contrast  10/02/2015  CLINICAL DATA:  Altered mental status/lethargy. End-stage renal disease. History of prostate carcinoma EXAM: CT HEAD WITHOUT CONTRAST TECHNIQUE: Contiguous axial images were obtained from the base of the skull through the vertex without intravenous contrast. COMPARISON:  None. FINDINGS: There is age related volume loss. There is no intracranial mass, hemorrhage, extra-axial fluid collection, or midline shift. There is no apparent intracranial edema. There is slight small vessel disease in the centra semiovale bilaterally. No acute infarct evident. There is extensive calcification in the middle cerebral arteries bilaterally. There is also calcification in both distal vertebral arteries and in the basilar artery. The bony calvarium appears intact although somewhat sclerotic in a generalized manner. Mastoid air cells are clear. No intraorbital lesions are identified. IMPRESSION: Age related volume loss with mild periventricular small vessel disease. There is no intracranial mass or edema. No hemorrhage. No acute infarct evident. There is extensive arterial vascular calcification. Diffuse sclerosis of  the calvarium is likely due to chronic renal failure. No well-defined metastatic foci are identified on this study. If intracranial neoplasm remains of concern, brain MRI would be advisable for further assessment. Electronically Signed   By: Lowella Grip III M.D.   On: 10/02/2015 16:07   Mr Brain Wo Contrast  10/02/2015  CLINICAL DATA:  Two day history of altered mental status. Somnolent. LEFT upper extremity twitching. History of end-stage renal disease on dialysis, metastatic prostate cancer, hypertension and hyperlipidemia. Evaluate acute encephalopathy. EXAM: MRI HEAD WITHOUT CONTRAST TECHNIQUE: Multiplanar, multiecho pulse sequences of the brain and surrounding structures were obtained without intravenous contrast. COMPARISON:  CT head October 02, 2015 at 1557 hours FINDINGS: Moderate to severe ventriculomegaly on the basis of global parenchymal brain volume loss. Patchy supratentorial white matter T2 hyperintensities without midline shift, mass effect, mass lesions. No reduced diffusion to suggest acute ischemia. No susceptibility artifact to suggest intraparenchymal hemorrhage. Small RIGHT frontal parietal subdural hematoma with intermediate to bright T1, bright FLAIR signal measuring up to 6 mm  with small amount suspected subarachnoid hemorrhage RIGHT frontal sulci. No extra-axial masses though, contrast enhanced sequences would be more sensitive. Dolicoectatic intracranial vascular flow voids at the skull base. Ocular globes and orbital contents are unremarkable though not tailored for evaluation. No abnormal sellar expansion. Diffusely low bone marrow signal compatible with diffuse osseous metastasis as previously noted. Craniocervical junction maintained. Paranasal sinuses are well-aerated. Small bilateral mastoid effusions. IMPRESSION: Acute to early subacute RIGHT frontoparietal subdural hematoma measuring up to 6 mm, no mass effect or midline shift. Small amount of RIGHT frontal suspected  subarachnoid hemorrhage. Moderate to severe global brain atrophy. Mild chronic small vessel ischemic disease. Dolicoectatic intracranial vessels compatible with chronic hypertension. These results will be called to the ordering clinician or representative by the Radiologist Assistant, and communication documented in the PACS or zVision Dashboard. Electronically Signed   By: Elon Alas M.D.   On: 10/02/2015 22:27   Medications:   . bicalutamide  50 mg Oral Daily  . darbepoetin (ARANESP) injection - DIALYSIS  200 mcg Intravenous Q Tue-HD  . feeding supplement (NEPRO CARB STEADY)  237 mL Oral BID BM  . metoprolol  25 mg Oral BID  . multivitamin  1 tablet Oral QHS  . pantoprazole  40 mg Oral Daily  . polyethylene glycol  17 g Oral Daily  . simvastatin  40 mg Oral QPM  . sodium chloride  3 mL Intravenous Q12H  . tamsulosin  0.4 mg Oral QHS

## 2015-10-04 NOTE — Care Management Note (Addendum)
Case Management Note  Patient Details  Name: Tim Kirby MRN: [id number] Date of Birth: 14-Jan-1941  Subjective/Objective:             CM following for progression and d/c planning.       Action/Plan: 10/04/2015 Notifed by pt MD that he and family are seeking SNF placement. This CM discussed need for PT and a 3 day stay for this pt as well as the need to establish that the pt can travel and sit for ongoing HD in the community. PT will be ordered and this CM will ask staff to assure that this pt sits for his next HD treatment.  Consult entered and CSW notified of need for SNF.   Expected Discharge Date:       10/06/2015            Expected Discharge Plan:  Skilled Nursing Facility  In-House Referral:  Clinical Social Work  Discharge planning Services  CM Consult  Post Acute Care Choice:  NA Choice offered to:  NA  DME Arranged:  N/A DME Agency:  NA  HH Arranged:  NA HH Agency:  NA  Status of Service:  Completed, signed off  Medicare Important Message Given:    Date Medicare IM Given:    Medicare IM give by:    Date Additional Medicare IM Given:    Additional Medicare Important Message give by:     If discussed at [place] of Stay Meetings, dates discussed:    Additional Comments:  Adron Bene, RN 10/04/2015, 3:36 PM

## 2015-10-04 NOTE — Progress Notes (Signed)
Subjective: Mr. Naumann was much more cogent today, and was joking with me about his favorite Rosanne Sack car being an [place]; he tells me trucks are no good because people want you to help them if you own one. He worked at a [employer] for 23 years. He is not in any pain today. He tells me he had pain in his lower back back in the fall when he was originally diagnosed with metastatic prostate cancer, but this is gotten much better since getting his monthly injections. He is not sure how much Percocet he was taking at home, because his wife manages these medications. His wife will be by when he gets out of dialysis this morning, and he is eager to go home.  Objective: Vital signs in last 24 hours: Filed Vitals:   10/03/15 1354 10/03/15 1637 10/03/15 2124 10/04/15 0500  BP: 121/67 124/66 138/60 133/64  Pulse: 101 82 100 88  Temp:   98.7 F (37.1 C) 98.1 F (36.7 C)  TempSrc:   Oral Oral  Resp:   18 16  Height:      Weight:   163 lb 2.3 oz (74 kg)   SpO2: 98% 98% 94% 98%   Weight change: -8 lb 13.8 oz (-4.019 kg)  Intake/Output Summary (Last 24 hours) at 10/04/15 [id number] Last data filed at 10/04/15 0600  Gross per 24 hour  Intake    960 ml  Output      0 ml  Net    960 ml   General: resting in bed comfortably, appropriately conversational, joking around HEENT: no scleral icterus, extra-ocular muscles intact, oropharynx without lesions Cardiac: irregular rhythm, 2/6 systolic murmur, unchanged from yesterday Pulm: breathing well, clear to auscultation bilaterally Abd: bowel sounds normal, soft, nondistended, non-tender Ext: warm and well perfused, without pedal edema Neuro: alert and oriented X3, able to spell "cat" backwards and forwards today, cranial nerves II-XII grossly intact, moving all extremities well  Micro Results: Recent Results (from the past 240 hour(s))  Culture, blood (routine x 2)     Status: None (Preliminary result)   Collection Time: 10/02/15  1:14 PM  Result  Value Ref Range Status   Specimen Description BLOOD RIGHT ANTECUBITAL  Final   Special Requests BOTTLES DRAWN AEROBIC AND ANAEROBIC 5CC  Final   Culture NO GROWTH 1 DAY  Final   Report Status PENDING  Incomplete  Culture, blood (routine x 2)     Status: None (Preliminary result)   Collection Time: 10/02/15  2:56 PM  Result Value Ref Range Status   Specimen Description BLOOD RIGHT ANTECUBITAL  Final   Special Requests BOTTLES DRAWN AEROBIC AND ANAEROBIC 5CC  Final   Culture NO GROWTH < 24 HOURS  Final   Report Status PENDING  Incomplete   Medications: I have reviewed the patient's current medications. Scheduled Meds: . bicalutamide  50 mg Oral Daily  . darbepoetin (ARANESP) injection - DIALYSIS  200 mcg Intravenous Q Tue-HD  . feeding supplement (NEPRO CARB STEADY)  237 mL Oral BID BM  . metoprolol  25 mg Oral BID  . multivitamin  1 tablet Oral QHS  . pantoprazole  40 mg Oral Daily  . polyethylene glycol  17 g Oral Daily  . simvastatin  40 mg Oral QPM  . sodium chloride  3 mL Intravenous Q12H  . tamsulosin  0.4 mg Oral QHS   Continuous Infusions:  PRN Meds:.acetaminophen, HYDROcodone-acetaminophen, ondansetron **OR** ondansetron (ZOFRAN) IV   Assessment/Plan:  Slowed executive functioning: He  is much more cogent this morning since we've held his narcotics, which reaffirms our suspicion that narcotics were the cause of his somnolence. He tells me his back pain from his bony metastases have been getting better since starting therapy, but is unable to tell me how much Percocet he was getting at home because his wife manages his medications. I recommend he only take these medications when absolutely needed. -Started Tylenol for mild to moderate pain -Continue oxycodone for breakthrough pain  Stage IV prostate cancer: His alkaline phosphatase level was 6000 on admission, up from 1500 in July of this year but he told us that his back pain has been getting better since starting therapy.  His PSA yesterday was 20.3, down from 460 back in September of this year before he started therapy. We also ordered a bone scan yesterday to evaluate for worsening of his bony metastases, but this has not been completed. Unfortunately, it was given before he went to dialysis, and too much time elapsed so he could not get an accurate read. Will recommend he get one as an outpatient to evaluate for progression of his metastatic disease, so that he can go home today. -Outpatient bone scan -Continue bicalutamide 50 mg daily  Subdural hematoma: A 6 mm right frontal subdural hematoma was noted on his MRI last night. There was no evidence of midline shift. Neurosurgery was called, who did not feel he needed surgical intervention, but did recommend a repeat CT head in one week. -Repeat CT head in one week -Continue to hold aspirin  Atrial fibrillation: His rates are well controlled. He is not anticoagulated. We'll continue his metoprolol. -Continue metoprolol 25 mg twice daily  End-stage renal disease: Continue dialysis on Tuesdays, Thursdays, and Saturdays.  Dispo: Occupational therapy evaluated him yesterday, and recommended a skilled nursing facility or 24 hour assistance. I'll call them this afternoon to have him reevaluate him; because he is so much more lucid today, he may be able to go home safely with some help.  The patient does have a current PCP Annie Main D. Megan Salon, MD) and does need an [facility] hospital follow-up appointment after discharge.  The patient does not have transportation limitations that hinder transportation to clinic appointments.  .Services Needed at time of discharge: Y = Yes, Blank = No PT:   OT:   RN:   Equipment:   Other:     LOS: 2 days   Loleta Chance, MD 10/04/2015, 7:17 AM

## 2015-10-04 NOTE — Progress Notes (Signed)
Speech Language Pathology Treatment: Dysphagia  Patient Details Name: Tim Kirby MRN: [id number] DOB: 1941-07-05 Today's Date: 10/04/2015 Time: 6394-3200 SLP Time Calculation (min) (ACUTE ONLY): 8 min  Assessment / Plan / Recommendation Clinical Impression  Pt agreeable to small amounts of PO as he had recently finished lunch. SLP provided skilled observation during intake of regular textures and thin liquids. No overt signs of aspiration or dysphagia noted. Recommend to continue current diet. No additional SLP f/u indicated for dysphagia; however, should cognitive impairments persist, pt may benefit from SLP cognitive assessment.   HPI HPI: 74 year old man with hypertension, paroxysmal atrial fibrillation, and end-stage renal disease on dialysis who was recently diagnosed with prostate cancer metastatic to bone in September of this year. Presents with a 2 day history of change in mental status with increasing somnolence, poor by mouth intake, disorientation, intermittent jerking motions of his left upper extremity with      SLP Plan  All goals met     Recommendations  Diet recommendations: Regular;Thin liquid Liquids provided via: Cup;Straw Medication Administration: Whole meds with liquid Supervision: Patient able to self feed;Intermittent supervision to cue for compensatory strategies Compensations: Minimize environmental distractions;Small sips/bites Postural Changes and/or Swallow Maneuvers: Seated upright 90 degrees       Oral Care Recommendations: Oral care BID Follow up Recommendations: 24 hour supervision/assistance Plan: All goals met   Tim Kirby, M.A. CCC-SLP [phone]  Tim Kirby 10/04/2015, 4:24 PM

## 2015-10-04 NOTE — Progress Notes (Signed)
Physical Therapy Note  Taken to hemodialysis. Will check back later today for PT evaluation.  [address] [place], [place]   10/04/2015 11:54

## 2015-10-05 ENCOUNTER — Inpatient Hospital Stay (HOSPITAL_COMMUNITY): Payer: Medicare Other

## 2015-10-05 ENCOUNTER — Ambulatory Visit (HOSPITAL_COMMUNITY): Admission: RE | Admit: 2015-10-05 | Payer: Medicare Other | Source: Ambulatory Visit

## 2015-10-05 ENCOUNTER — Ambulatory Visit: Payer: Medicare Other | Admitting: Vascular Surgery

## 2015-10-05 MED ORDER — TECHNETIUM TC 99M MEDRONATE IV KIT
25.5000 | PACK | Freq: Once | INTRAVENOUS | Status: AC | PRN
  Administered 2015-10-05: 25.5 via INTRAVENOUS

## 2015-10-05 MED ORDER — HYDROCODONE-ACETAMINOPHEN 5-325 MG PO TABS: 1.0000 | ORAL_TABLET | Freq: Four times a day (QID) | ORAL | Status: DC | PRN

## 2015-10-05 MED ORDER — SODIUM CHLORIDE 0.9 % IV SOLN: 62.5000 mg | INTRAVENOUS | Status: DC

## 2015-10-05 MED ORDER — DOXERCALCIFEROL 4 MCG/2ML IV SOLN: 6.0000 ug | INTRAVENOUS | Status: DC

## 2015-10-05 NOTE — Progress Notes (Signed)
Utilization review completed. Dianah Pruett, RN, BSN. 

## 2015-10-05 NOTE — Progress Notes (Signed)
Patient ID: Tim Kirby, male   DOB: 1941/06/07, 74 y.o.   MRN: [id number]   Subjective: Tim Kirby says he is feeling great today; he doesn't have any complaints. He is wife discuss going to a skilled nursing facility yesterday, and he is on board with that game plan.  Objective: Vital signs in last 24 hours: Filed Vitals:   10/04/15 1147 10/04/15 1758 10/04/15 2100 10/05/15 0500  BP: 130/95 101/78 109/83 118/68  Pulse: 90 88 94 94  Temp: 97.5 F (36.4 C) 98 F (36.7 C) 98.8 F (37.1 C) 98.5 F (36.9 C)  TempSrc: Oral Oral Oral Oral  Resp: 16 18 18 16   Height:      Weight: 70.8 kg (156 lb 1.4 oz)     SpO2: 98% 99% 99% 99%   Physcial exam: General: resting in bed comfortably, appropriately conversational, joking around, the most conversational I've seen him thus far HEENT: arcus senilis, no scleral icterus, extra-ocular muscles intact, oropharynx without lesions Cardiac: irregular rate with 2/6 systolic ejection murmur Pulm: breathing well, clear to auscultation bilaterally Abd: bowel sounds normal, soft, nondistended, non-tender Ext: warm and well perfused, without pedal edema Lymph: no cervical or supraclavicular lymphadenopathy Skin: no rash, hair, or nail changes Neuro: alert and oriented X3, cranial nerves II-XII grossly intact, moving all extremities well  Medications: I have reviewed the patient's current medications. Scheduled Meds: . bicalutamide  50 mg Oral Daily  . darbepoetin (ARANESP) injection - DIALYSIS  200 mcg Intravenous Q Tue-HD  . feeding supplement (NEPRO CARB STEADY)  237 mL Oral BID BM  . metoprolol  25 mg Oral BID  . multivitamin  1 tablet Oral QHS  . pantoprazole  40 mg Oral Daily  . polyethylene glycol  17 g Oral Daily  . simvastatin  40 mg Oral QPM  . sodium chloride  3 mL Intravenous Q12H  . tamsulosin  0.4 mg Oral QHS   Continuous Infusions:  PRN Meds:.acetaminophen, HYDROcodone-acetaminophen, ondansetron **OR** ondansetron (ZOFRAN)  IV  Assessment/Plan:  Slowed executive functioning: He is cogent, and back to his cognitive baseline, joking around with this this morning. As I discussed my last note, I think with his somnolence and impaired executive function was most likely from his opiates. -Continue Tylenol with oxycodone only for breakthrough pain  Stage IV prostate cancer: Per my prior note, there is question whether his bony metastatic burden was worsening while his PSA was dropping, so we'll get a bone scan today before he goes to a skilled nursing facility. I spoke to nuclear medicine, and they told him he should be out of his bone scan today around 2 PM -Bone scan today -Continue bicalutamide 50 mg daily  Subdural hematoma: A 6 mm right frontal subdural hematoma was noted on his MRI last night. There was no evidence of midline shift. Neurosurgery was called, who did not feel he needed surgical intervention, but did recommend a repeat CT head in one week. -Repeat CT head in one week -Continue to hold aspirin and DVT prophylaxis  Atrial fibrillation: His rates are well controlled. He is not anticoagulated. We'll continue his metoprolol. -Continue metoprolol 25 mg twice daily  End-stage renal disease: Continue dialysis on Tuesdays, Thursdays, and Saturdays.  Dispo: Discharge to SNF today  The patient does have a current PCP Annie Main D. Megan Salon, MD) and does need an [facility] hospital follow-up appointment after discharge.  The patient does have transportation limitations that hinder transportation to clinic appointments.  .Services Needed at time of discharge:  Y = Yes, Blank = No PT:   OT:   RN:   Equipment:   Other:     LOS: 3 days   Loleta Chance, MD 10/05/2015, 6:45 AM

## 2015-10-05 NOTE — Progress Notes (Signed)
Discontinued neuro checks per Dr.Kyle, which was verbally ordered yesterday but forgot to put the order.

## 2015-10-05 NOTE — Progress Notes (Signed)
Patient ID: Tim Kirby, male   DOB: 1941-04-05, 74 y.o.   MRN: [id number] Medicine attending: I personally examined this patient this morning together with resident physician Dr. Loleta Chance and I concur with his evaluation and management plan which we discussed together. Hospital discharge planned for yesterday was delayed in view of family desire for placement of Tim Kirby in an extended care facility. This will give [place] the opportunity to go ahead and reschedule the bone scan that got canceled yesterday. The patient's mentation is back to his baseline. No other acute issues identified at this time. Anticipate discharge to ECF later today.

## 2015-10-05 NOTE — Consult Note (Signed)
   [facility] CM Inpatient Consult   10/05/2015  Tim Kirby [birth year] [id number] Patient evaluated for community based chronic disease management services with [facility] as a benefit of patient's Medicare Insurance.  Met with patient and his wife, [name], at bedside to explain [facility] services. Endorses that Dr. Jenean Lindau is the primary care provider.  Admitted with Heart failure,  ESRD,  with a history of prostate cancer with mets to the bone.  Patient will receive post hospital discharge call and will be evaluated for monthly home visits for assessments and disease process education.  Consent form signed. Left contact information and [facility] literature at bedside. Made Inpatient Case Manager aware that [facility] [facility] following. Of note, [facility] Care Management services does not replace or interfere with any services that are arranged by inpatient case management or social work.  For additional questions or referrals please contact: Natividad Brood, RN BSN [place] Liaison  [phone] business mobile phone

## 2015-10-05 NOTE — Progress Notes (Signed)
Patient Discharge: Disposition: Patient discharged to home with home health. Education: Reviewed all his medications, prescriptions, discharge instructions, and follow up appointments with the patient, understood and acknowledged. IV: Discontinued before discharge. Telemetry: Discontinued before discharge, CCMD notified. Transportation: patient transported in w/c with family and staff. Belongings: Patient took all his belongings with him.

## 2015-10-05 NOTE — Progress Notes (Signed)
Physical Therapy Treatment Patient Details Name: Tim Kirby MRN: [id number] DOB: Aug 15, 1941 Today's Date: 10/05/2015    History of Present Illness Tim Kirby is a 74 yo male with ESRD (TTS), metastatic prostate cancer to bone, PAH, pAF, HTN, HLD, and gout, presenting with 2 day history of AMS found to have a small right frontoparietal subdural hematoma measuring up to 6 mm.    PT Comments    Great progress today. Family reports cognition back to baseline. Mod use of RW for support without loss of balance. Navigates steps similar to home environment without difficulty and no physical assist needed today. Adequate for d/c from PT standpoint.  Follow Up Recommendations  No PT follow up (pt at baseline per family)     Equipment Recommendations  None recommended by PT    Recommendations for Other Services       Precautions / Restrictions Precautions Precautions: Fall Restrictions Weight Bearing Restrictions: No    Mobility  Bed Mobility Overal bed mobility: Needs Assistance Bed Mobility: Supine to Sit     Supine to sit: Supervision     General bed mobility comments: supervision for safety extra time but no physical assist needed.  Transfers Overall transfer level: Needs assistance Equipment used: Rolling walker (2 wheeled) Transfers: Sit to/from Stand Sit to Stand: Supervision         General transfer comment: Good stability once upright, no physical assist required.  Ambulation/Gait Ambulation/Gait assistance: Supervision Ambulation Distance (Feet): 225 Feet Assistive device: Rolling walker (2 wheeled) Gait Pattern/deviations: Step-through pattern;Decreased stride length;Trendelenburg;Trunk flexed Gait velocity: decreased   General Gait Details: Improved gait mechanics, apparently back to baseline. moderate use of RW for support but stable without loss of balance, minimal difficulty with control. Able to ambulate backwards with cues to slow down. Did not  require assist for balance, no buckling noted.    Stairs Stairs: Yes Stairs assistance: Supervision Stair Management: One rail Right;Step to pattern;Forwards Number of Stairs: 3 General stair comments: cues for safe navigation techniques and sequencing using single rail to traverse 3 steps similar to home environment. Cues for foot placement fully on step before coming down. Verbalizes understanding and not assist was needed to safely complete today.  Wheelchair Mobility    Modified Rankin (Stroke Patients Only)       Balance     Sitting balance-Leahy Scale: Good                              Cognition Arousal/Alertness: Awake/alert Behavior During Therapy: WFL for tasks assessed/performed Overall Cognitive Status: Within Functional Limits for tasks assessed                 General Comments: Tim Kirby reports pt back to baseline today.    Exercises      General Comments General comments (skin integrity, edema, etc.): Family present, no questions or concerns about pt returning home.      Pertinent Vitals/Pain Pain Assessment: No/denies pain    Home Living Family/patient expects to be discharged to:: Private residence Living Arrangements: Spouse/significant other                  Prior Function            PT Goals (current goals can now be found in the care plan section) Acute Rehab PT Goals Patient Stated Goal: Go home today PT Goal Formulation: With patient Time For Goal Achievement: 10/18/15 Potential to Achieve Goals:  Good Progress towards PT goals: Progressing toward goals    Frequency  Min 3X/week    PT Plan Current plan remains appropriate    Co-evaluation             End of Session   Activity Tolerance: Patient tolerated treatment well Patient left: in bed;with call bell/phone within reach;with family/visitor present;with nursing/sitter in room     Time: 1545-1555 PT Time Calculation (min) (ACUTE ONLY): 10  min  Charges:  $Gait Training: 8-22 mins                    G Codes:      Tim Kirby 10-16-15, 4:47 PM Tim Kirby Tim Kirby, [place]

## 2015-10-05 NOTE — Clinical Social Work Note (Signed)
Clinical Social Worker received referral for ST-SNF placement.  Patient working well with therapies and per patient spouse, patient is at baseline.  Patient spouse hopeful for the supervision, but recognizes that this could be considered custodial care from an insurance standpoint and would rather patient return home.  Patient with adequate 24/7 family support at home and will have home health arranged at discharge.  CSW suggested that patient spouse begin to look at [place] if she feels patient is no longer safe at home - patient spouse agreeable and appreciative of advice.  CSW updated MD and CM regarding patient discharge plans.  Clinical Social Worker will sign off for now as social work intervention is no longer needed. Please consult [place] again if new need arises.  Barbette Or, [place]

## 2015-10-05 NOTE — Progress Notes (Signed)
[facility] Progress Note  Assessment/Plan: 1. Subdural hematoma- no heparin HD for dc 2. ESRD - TTS -  - will have a new lower EDW for d/c 3. Anemia - Hgb 8.9 - on max ESA stable +/- 33% sat, ferritin ~1000 -surprised it isn't higher given dx - continue weekly Fe 4. Secondary hyperparathyroidism - Hectorol held due to hypercalcemia - has metastatic disease with alk phos of 5500 and iPTH of 1347 -hectorol has been on hold but will resume at a lower dose; binders on hold at present 5. HTN/volume - Net UF Thursday 2.1 with post weight 70.8 6. Nutrition - renal diet/nepro/vits 7. Disp - plan d/c today 8. Metastatic prostate cancer 9. PAF - MTP - no anticoag 10. DNR  Myriam Jacobson, PA-C [place] 10/05/2015,3:39 PM  LOS: 3 days   Pt seen, examined and agree w A/P as above.  Kelly Splinter MD [facility] pager [phone]    cell [phone] 10/05/2015, 4:42 PM    Subjective:   Going home  Objective Filed Vitals:   10/04/15 1758 10/04/15 2100 10/05/15 0500 10/05/15 0847  BP: 101/78 109/83 118/68 106/63  Pulse: 88 94 94 87  Temp: 98 F (36.7 C) 98.8 F (37.1 C) 98.5 F (36.9 C) 98.3 F (36.8 C)  TempSrc: Oral Oral Oral Oral  Resp: _0 Height:      Weight:      SpO2: 99% 99% 99% 99%   Physical Exam General: frail elderly male NAD Heart: irreg irreg Lungs: no rales Abdomen: soft Extremities: no edema Dialysis Access: left AVF + bruit  Dialysis Orders: TTS Ash 2K, 2.25 Ca (just changed earlier today to 2Ca) 4 hours, 450/800 L AVF  EDW 72 kg Hectorol 9 mcg TIW Mircera 200 Q2weeks (last dosed 12/15) Weekly Fe 40  Additional Objective Labs: Basic Metabolic Panel:  Recent Labs Lab 10/02/15 1314 10/02/15 2002 10/03/15 0655 10/04/15 0758  NA 141  --  139  138 136  K 5.3*  --  3.5  3.5 3.8  CL 95*  --  96*  96* 96*  CO2 30  --  _1 GLUCOSE 101*  --  93  91 103*  BUN 60*   --  24*  24* 37*  CREATININE 9.48*  --  4.84*  4.84* 6.94*  CALCIUM 10.6*  --  9.8  9.8 9.5  PHOS  --  3.6 3.2 3.5   Liver Function Tests:  Recent Labs Lab 10/02/15 1314 10/03/15 0655 10/04/15 0758  AST 21 19  --   ALT 8* 7*  --   ALKPHOS 5968* 5535*  --   BILITOT 1.0 1.0  --   PROT 6.3* 5.3*  --   ALBUMIN 3.6 3.2*  3.2* 3.1*   CBC:  Recent Labs Lab 10/02/15 1314 10/03/15 0655 10/04/15 0759  WBC 6.0 4.3 4.3  NEUTROABS 4.0  --   --   HGB 9.3* 8.3* 8.9*  HCT 30.8* 27.6* 28.7*  MCV 98.4 96.5 95.3  PLT 129* 104* 106*   Blood Culture    Component Value Date/Time   SDES BLOOD RIGHT ANTECUBITAL 10/02/2015 1456   SPECREQUEST BOTTLES DRAWN AEROBIC AND ANAEROBIC 5CC 10/02/2015 1456   CULT NO GROWTH 3 DAYS 10/02/2015 1456   REPTSTATUS PENDING 10/02/2015 1456    CBG:  Recent Labs Lab 10/04/15 0717 10/04/15 1224 10/04/15 1649  GLUCAP 78 79 113*   Studies/Results: Nm Bone Scan Whole Body  10/05/2015  CLINICAL DATA:  Prostate cancer metastatic to bone EXAM: NUCLEAR MEDICINE WHOLE BODY BONE SCAN TECHNIQUE: Whole body anterior and posterior images were obtained approximately 3 hours after intravenous injection of radiopharmaceutical. RADIOPHARMACEUTICALS:  25.5 mCi Technetium-45mMDP IV COMPARISON:  None FINDINGS: Abnormal bone scan. Abnormal diffuse increased osseous tracer accumulation throughout the pelvis, ribs, spine, sternum, skull, humeri, femora, and tibia compatible with widespread osseous metastatic disease. Minimal residual soft tissue distribution of tracer. No urinary tract distribution of tracer. Pattern is consistent with a "super scan". IMPRESSION: Abnormal diffuse increased tracer localization throughout the axial and appendicular skeleton with decreased soft tissue tracer and absent urinary tract tracer consistent with widespread osseous metastatic disease and a "super scan" pattern . Electronically Signed   By: MLavonia DanaM.D.   On: 10/05/2015 13:49    Medications:   . bicalutamide  50 mg Oral Daily  . darbepoetin (ARANESP) injection - DIALYSIS  200 mcg Intravenous Q Tue-HD  . feeding supplement (NEPRO CARB STEADY)  237 mL Oral BID BM  . metoprolol  25 mg Oral BID  . multivitamin  1 tablet Oral QHS  . pantoprazole  40 mg Oral Daily  . polyethylene glycol  17 g Oral Daily  . simvastatin  40 mg Oral QPM  . sodium chloride  3 mL Intravenous Q12H  . tamsulosin  0.4 mg Oral QHS

## 2015-10-05 NOTE — Care Management Important Message (Signed)
Important Message  Patient Details  Name: Tim Kirby MRN: [id number] Date of Birth: 1940/12/15   Medicare Important Message Given:  Yes    [name] [name] 10/05/2015, 2:58 PM

## 2015-10-07 LAB — CULTURE, BLOOD (ROUTINE X 2)
CULTURE: NO GROWTH
Culture: NO GROWTH

## 2015-10-10 ENCOUNTER — Other Ambulatory Visit: Payer: Self-pay | Admitting: *Deleted

## 2015-10-10 NOTE — Patient Outreach (Signed)
[facility]) Care Management  10/10/2015  Tim Kirby 1941-07-11 [id number]   Initial transition of care outreach  I spoke briefly by phone with [name], he was alert oriented, person, place,able to verify date of birth, and his home address. Patient slow to answer questions, states that he is doing ok, but requested that I call back later today when his wife is home from work.   Plan I will contact patient's wife later today, to complete transition of care flow sheet.  Joylene Draft, RN, [facility] [phone]- Mobile [phone]- Toll Free Main Office

## 2015-10-11 ENCOUNTER — Encounter: Payer: Self-pay | Admitting: *Deleted

## 2015-10-11 ENCOUNTER — Other Ambulatory Visit: Payer: Self-pay | Admitting: *Deleted

## 2015-10-11 NOTE — Patient Outreach (Signed)
[place] [facility]) Care Management  10/11/2015  KOHLE WINNER [birth year] [id number]   Initial transition of care call  Telephone call able to speak with Catalina Lunger, wife of Tim Kirby. She reports that he is doing better.  Mr.Rancourt normally has dialysis Tuesday,Thursday and Saturday, she reports that he drove himself to dialysis on today and to an appointment to Dr. Bobby Rumpf oncologist on yesterday in which she met him at office.  Mrs.Older states that patient is taking medication as prescribed except for the Metoprolol 50 mg she is giving him 25 mg twice a day, "50 mg sounds like too much, it was ordered like that the last time but I was giving him 25 mg". Discussed with her importance of following prescriptions as MD ordered. Mrs.Suydam gives permission that I contact Dr.Campbell regarding  Clarification of Metoprolol dosage.  Mr. Fenoglio has working scales at home and reports that he weighs daily, denies increased swelling or weight gain, encouraged to log weights. Mr. Mey appetite has been good, he does not nepro supplement and does not drink it, he prefers boost.  Mr.Boehne has follow up appointment with Dr.Campbell on next Wednesday, and will have needed transportation.  Plan I will call [place] office to clarify Metoprolol dose, and inquire regarding patient need for follow up CT scan per discharge instructions to follow up  Right frontal subdural hematoma Will continue transition of care with a call on next or sooner to if needed to relay information .    [facility]N CM Care Plan Problem One        Most Recent Value   Care Plan Problem One  High Risk for Readmission due to comorbidities and recent hospitalization   Role Documenting the Problem One  Care Management [place] for Problem One  Active   THN Long Term Goal (31-90 days)  Patient will report no hospital admission in the next 31 days   THN Long Term Goal Start Date  10/11/15   Interventions  for Problem One Long Term Goal  Discussed transition of care program,provided my contact information, discussed importance of notifying MD sooner for new symptoms, changes to arrange for earlier appointment   [facility] CM Short Term Goal #1 (0-30 days)  Patient will attend all scheduled appointments in the next 10 days   [facility]N CM Short Term Goal #1 Start Date  10/11/15   Interventions for Short Term Goal #1  Discussed importance of PCP follow up post hospital admission  for medication review      Joylene Draft, RN, [place] Management [phone]- Mobile [phone]- [place]

## 2015-10-12 ENCOUNTER — Other Ambulatory Visit: Payer: Self-pay | Admitting: *Deleted

## 2015-10-12 NOTE — Patient Outreach (Signed)
  [facility]) Care Management  10/12/2015  WEIR CROFF 24-Mar-1941 [id number]  Telephone call to Tim Kirby, to communicate information after speaking with Dr.Campbell office, made Tim Kirby aware that Dr.Campbell will address when to get the CT scan done at office visit on next week, and review all medication, continue metoprolol as wife is currently giving at 25 mg twice daily.  Tim Kirby was alert oriented person, place, time, day and stated he was feeling pretty good.  Joylene Draft, RN, [place] Management [phone]- Mobile [phone]- Toll Free Main Office

## 2015-10-12 NOTE — Patient Outreach (Signed)
[name]) Care Management  10/12/2015  [name] [birth year] [id number]   Incoming call from Nonie Hoyer, [facility], regarding follow up on dosage of Metoprolol,as of patient's last  office visit patient was on Metoprolol 25 mg twice daily, I again informed nurse that [name]'s wife is only giving him 25 mg twice daily instead of 50 mg twice daily as hospital discharge instructions read. Per office they will follow up with patient on 1/11 office visit, regarding medication and follow up CT scan   Plan I will inform [name]/[name] of above conversation.  Joylene Draft, RN, [place] Management [phone]- Mobile [phone]- Toll Free Main Office

## 2015-10-15 NOTE — Progress Notes (Signed)
This encounter was created in error - please disregard.

## 2015-10-18 ENCOUNTER — Other Ambulatory Visit: Payer: Self-pay | Admitting: *Deleted

## 2015-10-18 NOTE — Patient Outreach (Signed)
[facility]) Care Management  10/18/2015  Tim Kirby [birth year] [id number]   Transition of care week 2  Telephone call to Tim Kirby, spoke with [name], she reports that Tim Kirby is doing well, He was able to attend his  Hemodialysis on Saturday and follow up appointment with PCP on 1/11.  Tim Kirby denies any new issues or concerns Tim Kirby has been taking his medication as prescribed with her help of organizing his medications.  Plan Schedule initial home visit for 1/16 at [place], [place], [place] Management [phone]- Mobile [phone]- [place]

## 2015-10-22 ENCOUNTER — Encounter: Payer: Self-pay | Admitting: *Deleted

## 2015-10-22 ENCOUNTER — Other Ambulatory Visit: Payer: Self-pay | Admitting: *Deleted
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
## 2015-10-29 ENCOUNTER — Other Ambulatory Visit: Payer: Self-pay | Admitting: *Deleted

## 2015-10-29 NOTE — Patient Outreach (Signed)
[facility]) Care Management  10/29/2015  [name] Jun 10, 1941 [id number]   Transition of care week 4 Telephone call to [name], he has requested that I speak with [name], she reports that he is "making it okay". [name] continues to attend Hemodialysis, she states he is able to drive himself. [name] reports that his pain is controlled with patient taking Vicodin at least daily, without symptoms of mental disturbance noted.  Briefly discussed palliative care, [name] states that they are not at that point yet, I offered additional information, encouraged for her and [name] to discuss with PCP. [name] denies further questions or concerns  Plan  Final transition of care call, and case closure on Monday, January 30.  Joylene Draft, RN, [place] Management [phone]- Mobile [phone]- Toll Free Main Office

## 2015-11-05 ENCOUNTER — Encounter: Payer: Self-pay | Admitting: *Deleted

## 2015-11-05 ENCOUNTER — Other Ambulatory Visit: Payer: Self-pay | Admitting: *Deleted

## 2015-11-05 NOTE — Patient Outreach (Signed)
[facility]) Care Management  11/05/2015  EDYN NORDELL 07-06-1941 [id number]   Transition of care  Spoke with Mr.Vogt's wife, as Mr.Bechard as requested, she reports that patient is doing "about the same". Mrs.Goodgame reports that the patient continues to drive himself to hemodialysis 3 days a week. She reports that patient pain is controlled with his prn pain medication, and without any other changes in mental status.  Discussed progress, and further care management needs, Mrs. Liechty states that we are managing okay,no other needs or concerns. Patient continues to follow up with PCP on regular basis and oncologist for treatments. Discussed further self care management of Heart Failure with health coach, she states we have gone through that and we have it under control. Briefly discussed palliative/hospice support, and wife understands to discuss further with PCP, when they come to that decision, "we are not at that point yet", declined further information at present.  Ensured that Mrs.Black has THN contact information for future care management .  Plan Plan case closure, notify MD, Send patient case closure letter  Send Lurline Del, CMA in basket message   Joylene Draft, RN, [place] Care Management [phone]- Mobile [phone]- [place] .

## 2015-11-16 DIAGNOSIS — C7951 Secondary malignant neoplasm of bone: Secondary | ICD-10-CM

## 2015-11-16 DIAGNOSIS — G893 Neoplasm related pain (acute) (chronic): Secondary | ICD-10-CM | POA: Diagnosis not present

## 2015-11-16 DIAGNOSIS — C61 Malignant neoplasm of prostate: Secondary | ICD-10-CM

## 2015-11-20 ENCOUNTER — Encounter (HOSPITAL_COMMUNITY): Payer: Self-pay | Admitting: Neurology

## 2015-11-20 ENCOUNTER — Emergency Department (HOSPITAL_COMMUNITY)
Admission: EM | Admit: 2015-11-20 | Discharge: 2015-11-20 | Disposition: A | Discharge status: Home / Self Care | Payer: Medicare Other | Attending: Emergency Medicine | Admitting: Emergency Medicine

## 2015-11-20 ENCOUNTER — Other Ambulatory Visit: Payer: Self-pay

## 2015-11-20 DIAGNOSIS — M109 Gout, unspecified: Secondary | ICD-10-CM | POA: Insufficient documentation

## 2015-11-20 DIAGNOSIS — Z859 Personal history of malignant neoplasm, unspecified: Secondary | ICD-10-CM | POA: Insufficient documentation

## 2015-11-20 DIAGNOSIS — R29810 Facial weakness: Secondary | ICD-10-CM | POA: Diagnosis present

## 2015-11-20 DIAGNOSIS — R29898 Other symptoms and signs involving the musculoskeletal system: Secondary | ICD-10-CM

## 2015-11-20 DIAGNOSIS — I120 Hypertensive chronic kidney disease with stage 5 chronic kidney disease or end stage renal disease: Secondary | ICD-10-CM | POA: Diagnosis not present

## 2015-11-20 DIAGNOSIS — Z87891 Personal history of nicotine dependence: Secondary | ICD-10-CM | POA: Diagnosis not present

## 2015-11-20 DIAGNOSIS — Z992 Dependence on renal dialysis: Secondary | ICD-10-CM | POA: Diagnosis not present

## 2015-11-20 DIAGNOSIS — I209 Angina pectoris, unspecified: Secondary | ICD-10-CM | POA: Diagnosis not present

## 2015-11-20 DIAGNOSIS — R531 Weakness: Secondary | ICD-10-CM | POA: Insufficient documentation

## 2015-11-20 DIAGNOSIS — N186 End stage renal disease: Secondary | ICD-10-CM | POA: Diagnosis not present

## 2015-11-20 DIAGNOSIS — Z79899 Other long term (current) drug therapy: Secondary | ICD-10-CM | POA: Insufficient documentation

## 2015-11-20 DIAGNOSIS — E875 Hyperkalemia: Secondary | ICD-10-CM | POA: Diagnosis not present

## 2015-11-20 DIAGNOSIS — Z862 Personal history of diseases of the blood and blood-forming organs and certain disorders involving the immune mechanism: Secondary | ICD-10-CM | POA: Insufficient documentation

## 2015-11-20 DIAGNOSIS — I509 Heart failure, unspecified: Secondary | ICD-10-CM | POA: Insufficient documentation

## 2015-11-20 DIAGNOSIS — R253 Fasciculation: Secondary | ICD-10-CM | POA: Insufficient documentation

## 2015-11-20 LAB — CBC WITH DIFFERENTIAL/PLATELET
Basophils Absolute: 0 10*3/uL (ref 0.0–0.1)
Basophils Relative: 1 %
EOS ABS: 0 10*3/uL (ref 0.0–0.7)
Eosinophils Relative: 1 %
HEMATOCRIT: 33.9 % — AB (ref 39.0–52.0)
HEMOGLOBIN: 10.4 g/dL — AB (ref 13.0–17.0)
LYMPHS ABS: 1.7 10*3/uL (ref 0.7–4.0)
Lymphocytes Relative: 28 %
MCH: 27.7 pg (ref 26.0–34.0)
MCHC: 30.7 g/dL (ref 30.0–36.0)
MCV: 90.4 fL (ref 78.0–100.0)
Monocytes Absolute: 0.5 10*3/uL (ref 0.1–1.0)
Monocytes Relative: 8 %
NEUTROS ABS: 3.6 10*3/uL (ref 1.7–7.7)
NEUTROS PCT: 62 %
Platelets: 121 10*3/uL — ABNORMAL LOW (ref 150–400)
RBC: 3.75 MIL/uL — AB (ref 4.22–5.81)
RDW: 19.2 % — ABNORMAL HIGH (ref 11.5–15.5)
WBC: 5.8 10*3/uL (ref 4.0–10.5)

## 2015-11-20 LAB — COMPREHENSIVE METABOLIC PANEL
ALBUMIN: 2.9 g/dL — AB (ref 3.5–5.0)
ALK PHOS: 3277 U/L — AB (ref 38–126)
ALT: 9 U/L — AB (ref 17–63)
AST: 25 U/L (ref 15–41)
Anion gap: 22 — ABNORMAL HIGH (ref 5–15)
BUN: 67 mg/dL — ABNORMAL HIGH (ref 6–20)
CALCIUM: 10.2 mg/dL (ref 8.9–10.3)
CO2: 23 mmol/L (ref 22–32)
CREATININE: 9.15 mg/dL — AB (ref 0.61–1.24)
Chloride: 94 mmol/L — ABNORMAL LOW (ref 101–111)
GFR calc Af Amer: 6 mL/min — ABNORMAL LOW (ref >60)
GFR calc non Af Amer: 5 mL/min — ABNORMAL LOW (ref >60)
GLUCOSE: 97 mg/dL (ref 65–99)
Potassium: 5.7 mmol/L — ABNORMAL HIGH (ref 3.5–5.1)
SODIUM: 139 mmol/L (ref 135–145)
Total Bilirubin: 0.8 mg/dL (ref 0.3–1.2)
Total Protein: 6.2 g/dL — ABNORMAL LOW (ref 6.5–8.1)

## 2015-11-20 LAB — MAGNESIUM: Magnesium: 2.3 mg/dL (ref 1.7–2.4)

## 2015-11-20 NOTE — ED Provider Notes (Signed)
CSN: [id number]     Arrival date & time 11/20/15  0840 History   First MD Initiated Contact with Patient 11/20/15 [phone]     Chief Complaint  Patient presents with  . Facial Droop     (Consider location/radiation/quality/duration/timing/severity/associated sxs/prior Treatment) HPI  Tim Kirby is a 75 y.o. male  with a hx of gout, anemia, hypertension, CHF, ESRD on dialysis Tuesday Thursday Saturday, metastatic bone cancer with unknown primary presents to the Emergency Department complaining of gradual, persistent, progressively worsening twitching to his bilateral face and arms onset last night.  Patient's brother reports that patient get facial heaviness which sometimes looks droop over the three-day weekend when he has not dialyzed.  They report it is most commonly on the right as it was last night and this morning. They report that is usually resolves with dialysis.  Patient has not had altered mental status, slurred speech or other focal weakness. Patient reports persistent baseline weakness of his legs for many years.  She reports a very mild shortness of breath consistent with the way he normally feels the morning of his scheduled dialysis. No aggravating or alleviating factors. Patient currently denies chest pain, fever, chills, abdominal pain, nausea, vomiting, focal weakness, falls, vision changes.   Past Medical History  Diagnosis Date  . Hyperlipidemia   . Gout   . Secondary hyperparathyroidism ([place])   . Anemia   . Colon, diverticulosis   . Thyroid nodule   . Anginal pain ([place])     sees Dr. Lia Foyer  . Hypertension     sees Dr. Willy Eddy  . 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 Upper respiratory infection April 2016  . CHF (congestive heart failure) ([place])   . 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 ESRD (end stage renal disease) on dialysis ([place])     "TTS; [place]" (06/15/2015)  . Renal insufficiency   . Cancer [facility])    Past Surgical History  Procedure Laterality Date   . Knee surgery Left     cyst removal by Dr. Lorin Mercy  . Av fistula placement Left     left UA AVF  . Femoral-popliteal bypass graft  10/18/2012    Procedure: BYPASS GRAFT FEMORAL-POPLITEAL ARTERY;  Surgeon: Conrad [place], MD;  Location: [facility] OR;  Service: Vascular;  Laterality: Right;  Right Popliteal Aneurysm Exclusion; Ultrasound guided  . False aneurysm repair Left 12/29/2012    Procedure: REPAIR FALSE ANEURYSM;  Surgeon: Conrad [place], MD;  Location: [place];  Service: Vascular;  Laterality: Left;  . Endovascular repair of popliteal artery aneurysm Left 12/29/2012    Procedure: ENDOVASCULAR REPAIR OF POPLITEAL AND FEMORAL ARTERY ANEURYSM;  Surgeon: Conrad [place], MD;  Location: [place];  Service: Vascular;  Laterality: Left;  . Abdominal aortagram N/A 09/27/2012    Procedure: ABDOMINAL Maxcine Ham;  Surgeon: Angelia Mould, MD;  Location[facility] CATH LAB;  Service: Cardiovascular;  Laterality: N/A;  . Tonsillectomy and adenoidectomy     Family History  Problem Relation Age of Onset  . Hypertension Mother   . Diabetes Mother   . Diabetes Brother    Social History  Substance Use Topics  . Smoking status: Former Smoker -- 50 years    Types: Pipe, Cigars, Cigarettes    Quit date: 09/18/2007  . Smokeless tobacco: Never Used     Comment: "mostly smoked cigars and pipes"  . Alcohol Use: Yes     Comment: "stopped drinking in ~ 2008"    Review  of Systems  Constitutional: Negative for fever, diaphoresis, appetite change, fatigue and unexpected weight change.  HENT: Negative for mouth sores.        Bilateral facial "heaviness"  Eyes: Negative for visual disturbance.  Respiratory: Negative for cough, chest tightness, shortness of breath and wheezing.   Cardiovascular: Negative for chest pain.  Gastrointestinal: Negative for nausea, vomiting, abdominal pain, diarrhea and constipation.  Endocrine: Negative for polydipsia, polyphagia and polyuria.  Genitourinary: Negative for dysuria, urgency,  frequency and hematuria.  Musculoskeletal: Negative for back pain and neck stiffness.  Skin: Negative for rash.  Allergic/Immunologic: Negative for immunocompromised state.  Neurological: Negative for syncope, light-headedness and headaches.       Muscle twitching  Hematological: Does not bruise/bleed easily.  Psychiatric/Behavioral: Negative for sleep disturbance. The patient is not nervous/anxious.       Allergies  Denosumab  Home Medications   Prior to Admission medications   Medication Sig Start Date End Date Taking? Authorizing Provider  gabapentin (NEURONTIN) 100 MG capsule Take 1 capsule (100 mg total) by mouth 2 (two) times daily. 06/28/15  Yes Patrecia Pour, MD  HYDROcodone-acetaminophen (NORCO/VICODIN) 5-325 MG tablet Take 1-2 tablets by mouth every 6 (six) hours as needed for severe pain (be aware this can make him drowsy so only take after tylenol. and limit total tylenol dose to 4000mg /day). 10/04/15  Yes Loleta Chance, MD  metoprolol (LOPRESSOR) 50 MG tablet Take 1 tablet (50 mg total) by mouth 2 (two) times daily. Patient taking differently: Take 25 mg by mouth 2 (two) times daily. 25 mg twice daily 06/28/15  Yes Patrecia Pour, MD  multivitamin (RENA-VIT) TABS tablet Take 1 tablet by mouth daily.   Yes Historical Provider, MD  omeprazole (PRILOSEC) 40 MG capsule Take 40 mg by mouth daily.   Yes Historical Provider, MD  polyethylene glycol (MIRALAX / GLYCOLAX) packet Take 17 g by mouth daily. 07/14/15  Yes Asiyah Cletis Media, MD  tamsulosin (FLOMAX) 0.4 MG CAPS capsule Take 0.4 mg by mouth at bedtime. 07/09/15  Yes Historical Provider, MD  acetaminophen (TYLENOL) 325 MG tablet Take 1 tablet (325 mg total) by mouth every 4 (four) hours as needed for mild pain or moderate pain. Patient not taking: Reported on 10/22/2015 10/04/15   Loleta Chance, MD  bicalutamide (CASODEX) 50 MG tablet Take 1 tablet (50 mg total) by mouth daily. Patient not taking: Reported on 11/20/2015 06/16/15    Milagros Loll, MD  calcium carbonate (TUMS - DOSED IN MG ELEMENTAL CALCIUM) 500 MG chewable tablet Chew 5 tablets (1,000 mg of elemental calcium total) by mouth 3 (three) times daily with meals. Patient not taking: Reported on 10/22/2015 07/14/15   Asiyah Cletis Media, MD  HYDROcodone-acetaminophen (NORCO/VICODIN) 5-325 MG tablet Take 1-2 tablets by mouth every 6 (six) hours as needed (breakthrough pain). Patient not taking: Reported on 10/11/2015 10/05/15   Loleta Chance, MD  Nutritional Supplements (FEEDING SUPPLEMENT, NEPRO CARB STEADY,) LIQD Take 237 mLs by mouth 2 (two) times daily between meals. Patient not taking: Reported on 10/11/2015 10/04/15   Loleta Chance, MD   BP 114/61 mmHg  Pulse 58  Temp(Src) 98.2 F (36.8 C) (Oral)  Resp 15  SpO2 94% Physical Exam  Constitutional: He is oriented to person, place, and time. He appears well-developed and well-nourished. No distress.  HENT:  Head: Normocephalic and atraumatic.  Mouth/Throat: Oropharynx is clear and moist.  Eyes: Conjunctivae and EOM are normal. Pupils are equal, round, and reactive to light. No scleral icterus.  No horizontal, vertical or rotational nystagmus  Neck: Normal range of motion. Neck supple.  Full active and passive ROM without pain No midline or paraspinal tenderness No nuchal rigidity or meningeal signs  Cardiovascular: Normal rate, regular rhythm, normal heart sounds and intact distal pulses.   Pulmonary/Chest: Effort normal and breath sounds normal. No respiratory distress. He has no wheezes. He has no rales.  Abdominal: Soft. Bowel sounds are normal. There is no tenderness. There is no rebound and no guarding.  Musculoskeletal: Normal range of motion.  Lymphadenopathy:    He has no cervical adenopathy.  Neurological: He is alert and oriented to person, place, and time. He has normal reflexes. No cranial nerve deficit. He exhibits normal muscle tone. Coordination normal.  Mental Status:  Alert, oriented,  thought content appropriate. Speech fluent without evidence of aphasia. Able to follow 2 step commands without difficulty.  Cranial Nerves:  II:  Peripheral visual fields grossly normal, pupils equal, round, reactive to light III,IV, VI: ptosis not present, extra-ocular motions intact bilaterally  V,VII: smile symmetric, facial light touch sensation equal VIII: hearing grossly normal bilaterally  IX,X: midline uvula rise  XI: bilateral shoulder shrug equal and strong XII: midline tongue extension  Motor:  5/5 in upper extremities bilaterally including strong and equal grip strength  4/5 in lower extremities bilaterally which patient reports is baseline but 5/5 strength with dorsiflexion/plantar flexion bilaterally Sensory: Pinprick and light touch normal in all extremities.  Deep Tendon Reflexes: 2+ and symmetric  Cerebellar: normal finger-to-nose with bilateral upper extremities Gait: Gait testing deferred CV: distal pulses palpable throughout   Skin: Skin is warm and dry. No rash noted. He is not diaphoretic.  Left arm fistula with palpable thrill  Psychiatric: He has a normal mood and affect. His behavior is normal. Judgment and thought content normal.  Nursing note and vitals reviewed.   ED Course  Procedures (including critical care time) Labs Review Labs Reviewed  CBC WITH DIFFERENTIAL/PLATELET - Abnormal; Notable for the following:    RBC 3.75 (*)    Hemoglobin 10.4 (*)    HCT 33.9 (*)    RDW 19.2 (*)    Platelets 121 (*)    All other components within normal limits  COMPREHENSIVE METABOLIC PANEL - Abnormal; Notable for the following:    Potassium 5.7 (*)    Chloride 94 (*)    BUN 67 (*)    Creatinine, Ser 9.15 (*)    Total Protein 6.2 (*)    Albumin 2.9 (*)    ALT 9 (*)    Alkaline Phosphatase 3277 (*)    GFR calc non Af Amer 5 (*)    GFR calc Af Amer 6 (*)    Anion gap 22 (*)    All other components within normal limits  MAGNESIUM      EKG  Interpretation   Date/Time:  Tuesday November 20 2015 08:45:48 EST Ventricular Rate:  65 PR Interval:  180 QRS Duration: 84 QT Interval:  430 QTC Calculation: 447 R Axis:   80 Text Interpretation:  Normal sinus rhythm Possible Anterior infarct , age  undetermined Abnormal ECG No significant change was found Confirmed by  CAMPOS  MD, Lennette Bihari ([id number]) on 11/20/2015 11:11:01 AM      MDM   Final diagnoses:  Twitching  Hyperkalemia  End stage renal disease on dialysis (HCC)  Weakness of both legs   Tim Kirby presents with twitching and facial heaviness.  His brother reports that this regularly happens on  his 3 day weekend when he goes to dialysis on Tuesdays. He has an appointment at 11 AM.  Normal neurologic exam with generalized weakness in the bilateral lower legs at baseline.  Patient without facial droop on my assessment in the emergency department.  Labs consistent with need for dialysis. Hyperkalemia 5.7 without EKG changes. Discussed with the [facility] who reports that they are willing to dialyze him if he comes today he missed his appointment.   The patient was discussed with and seen by Dr. Venora Maples who agrees with the treatment plan. On Dr. Arnetha Courser assessment pt drooling out of the left side of the mouth.     Jarrett Soho Zaquan Duffner, PA-C 11/20/15 [place], MD 11/20/15 1620

## 2015-11-20 NOTE — Discharge Instructions (Signed)
1. Medications: usual home medications 2. Treatment: rest, drink plenty of fluids,  3. Follow Up: Please go immediately to your dialysis center for dialysis

## 2015-11-20 NOTE — ED Notes (Signed)
Patient undressed, in gown, on monitor, continuous pulse oximetry and blood pressure cuff; visitor at bedside 

## 2015-11-20 NOTE — ED Notes (Signed)
Pt is here with his brother, who reports last night pt started having "twitching" and right sided facial droop. This morning he woke up with right sided facial droop. His brother was going to take him to dialysis but knew they wouldn't dialyze him with the facial droop. He also reports the facial droop and twitches start before he needs dialysis. Pt is alert and oriented.

## 2015-12-03 ENCOUNTER — Other Ambulatory Visit: Payer: Self-pay

## 2015-12-03 ENCOUNTER — Telehealth: Payer: Self-pay

## 2015-12-03 DIAGNOSIS — I723 Aneurysm of iliac artery: Secondary | ICD-10-CM

## 2015-12-03 DIAGNOSIS — I739 Peripheral vascular disease, unspecified: Secondary | ICD-10-CM

## 2015-12-03 DIAGNOSIS — Z95828 Presence of other vascular implants and grafts: Secondary | ICD-10-CM

## 2015-12-03 NOTE — Telephone Encounter (Signed)
Pt's wife called.  Reported the pt. has missed the last 2 appts., due to being in the hospital.  Requested to reschedule an appt. with Dr. Bridgett Larsson.  Reported the pt. C/o pain in both legs, intermittently with rest and activity.  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  Stated there has been no noticeable change in temperature or discoloration of lower extremities.  Stated the swelling of his feet and legs has improved, since they have been treating his CHF.  Denied any open sores of lower extremities.  Noted that pt. was scheduled for several vascular studies at his f/u in November, and then, due to finding of right CFA aneurysm, had a CTA abd/ pelvis ordered too.  Wife reported that the CTA never got done.  Advised will have a scheduler call with appts.  Agreed.

## 2015-12-04 ENCOUNTER — Inpatient Hospital Stay (HOSPITAL_COMMUNITY)
Admission: EM | Admit: 2015-12-04 | Discharge: 2015-12-05 | DRG: 070 | Disposition: A | Discharge status: Hospice | Payer: Medicare Other | Attending: Internal Medicine | Admitting: Internal Medicine

## 2015-12-04 ENCOUNTER — Observation Stay (HOSPITAL_COMMUNITY): Payer: Medicare Other

## 2015-12-04 ENCOUNTER — Encounter (HOSPITAL_COMMUNITY): Payer: Self-pay | Admitting: *Deleted

## 2015-12-04 ENCOUNTER — Emergency Department (HOSPITAL_COMMUNITY): Payer: Medicare Other

## 2015-12-04 DIAGNOSIS — I5032 Chronic diastolic (congestive) heart failure: Secondary | ICD-10-CM

## 2015-12-04 DIAGNOSIS — R2990 Unspecified symptoms and signs involving the nervous system: Secondary | ICD-10-CM | POA: Diagnosis not present

## 2015-12-04 DIAGNOSIS — C7931 Secondary malignant neoplasm of brain: Secondary | ICD-10-CM | POA: Diagnosis present

## 2015-12-04 DIAGNOSIS — N186 End stage renal disease: Secondary | ICD-10-CM

## 2015-12-04 DIAGNOSIS — I2510 Atherosclerotic heart disease of native coronary artery without angina pectoris: Secondary | ICD-10-CM | POA: Diagnosis present

## 2015-12-04 DIAGNOSIS — R29810 Facial weakness: Secondary | ICD-10-CM | POA: Diagnosis present

## 2015-12-04 DIAGNOSIS — E785 Hyperlipidemia, unspecified: Secondary | ICD-10-CM | POA: Diagnosis present

## 2015-12-04 DIAGNOSIS — R4701 Aphasia: Secondary | ICD-10-CM | POA: Diagnosis present

## 2015-12-04 DIAGNOSIS — I672 Cerebral atherosclerosis: Secondary | ICD-10-CM | POA: Diagnosis present

## 2015-12-04 DIAGNOSIS — Z9221 Personal history of antineoplastic chemotherapy: Secondary | ICD-10-CM

## 2015-12-04 DIAGNOSIS — C7951 Secondary malignant neoplasm of bone: Secondary | ICD-10-CM | POA: Diagnosis present

## 2015-12-04 DIAGNOSIS — I480 Paroxysmal atrial fibrillation: Secondary | ICD-10-CM | POA: Diagnosis present

## 2015-12-04 DIAGNOSIS — I10 Essential (primary) hypertension: Secondary | ICD-10-CM | POA: Diagnosis present

## 2015-12-04 DIAGNOSIS — R4182 Altered mental status, unspecified: Secondary | ICD-10-CM | POA: Diagnosis present

## 2015-12-04 DIAGNOSIS — G9349 Other encephalopathy: Secondary | ICD-10-CM | POA: Diagnosis present

## 2015-12-04 DIAGNOSIS — I4892 Unspecified atrial flutter: Secondary | ICD-10-CM

## 2015-12-04 DIAGNOSIS — R569 Unspecified convulsions: Secondary | ICD-10-CM | POA: Diagnosis present

## 2015-12-04 DIAGNOSIS — R531 Weakness: Secondary | ICD-10-CM | POA: Diagnosis present

## 2015-12-04 DIAGNOSIS — E875 Hyperkalemia: Secondary | ICD-10-CM

## 2015-12-04 DIAGNOSIS — G9340 Encephalopathy, unspecified: Secondary | ICD-10-CM | POA: Diagnosis not present

## 2015-12-04 DIAGNOSIS — R627 Adult failure to thrive: Secondary | ICD-10-CM | POA: Diagnosis present

## 2015-12-04 DIAGNOSIS — C61 Malignant neoplasm of prostate: Secondary | ICD-10-CM | POA: Diagnosis present

## 2015-12-04 DIAGNOSIS — Z888 Allergy status to other drugs, medicaments and biological substances status: Secondary | ICD-10-CM

## 2015-12-04 DIAGNOSIS — Z66 Do not resuscitate: Secondary | ICD-10-CM | POA: Diagnosis present

## 2015-12-04 DIAGNOSIS — N2581 Secondary hyperparathyroidism of renal origin: Secondary | ICD-10-CM | POA: Diagnosis present

## 2015-12-04 DIAGNOSIS — I132 Hypertensive heart and chronic kidney disease with heart failure and with stage 5 chronic kidney disease, or end stage renal disease: Secondary | ICD-10-CM | POA: Diagnosis present

## 2015-12-04 DIAGNOSIS — Z87891 Personal history of nicotine dependence: Secondary | ICD-10-CM | POA: Diagnosis not present

## 2015-12-04 DIAGNOSIS — Z515 Encounter for palliative care: Secondary | ICD-10-CM | POA: Diagnosis not present

## 2015-12-04 DIAGNOSIS — Z992 Dependence on renal dialysis: Secondary | ICD-10-CM | POA: Diagnosis not present

## 2015-12-04 DIAGNOSIS — D649 Anemia, unspecified: Secondary | ICD-10-CM | POA: Diagnosis present

## 2015-12-04 LAB — COMPREHENSIVE METABOLIC PANEL
ALK PHOS: 2276 U/L — AB (ref 38–126)
ALT: 13 U/L — AB (ref 17–63)
AST: 28 U/L (ref 15–41)
Albumin: 3.4 g/dL — ABNORMAL LOW (ref 3.5–5.0)
Anion gap: 22 — ABNORMAL HIGH (ref 5–15)
BILIRUBIN TOTAL: 0.7 mg/dL (ref 0.3–1.2)
BUN: 98 mg/dL — AB (ref 6–20)
CALCIUM: 9 mg/dL (ref 8.9–10.3)
CHLORIDE: 92 mmol/L — AB (ref 101–111)
CO2: 22 mmol/L (ref 22–32)
CREATININE: 8.9 mg/dL — AB (ref 0.61–1.24)
GFR calc Af Amer: 6 mL/min — ABNORMAL LOW (ref >60)
GFR, EST NON AFRICAN AMERICAN: 5 mL/min — AB (ref >60)
Glucose, Bld: 98 mg/dL (ref 65–99)
Potassium: 6.2 mmol/L (ref 3.5–5.1)
Sodium: 136 mmol/L (ref 135–145)
TOTAL PROTEIN: 6.1 g/dL — AB (ref 6.5–8.1)

## 2015-12-04 LAB — DIFFERENTIAL
Basophils Absolute: 0 10*3/uL (ref 0.0–0.1)
Basophils Relative: 0 %
Eosinophils Absolute: 0 10*3/uL (ref 0.0–0.7)
Eosinophils Relative: 0 %
LYMPHS ABS: 2.1 10*3/uL (ref 0.7–4.0)
LYMPHS PCT: 22 %
MONO ABS: 0.5 10*3/uL (ref 0.1–1.0)
MONOS PCT: 5 %
NEUTROS PCT: 73 %
Neutro Abs: 6.9 10*3/uL (ref 1.7–7.7)

## 2015-12-04 LAB — CBC
HCT: 39.4 % (ref 39.0–52.0)
Hemoglobin: 12.3 g/dL — ABNORMAL LOW (ref 13.0–17.0)
MCH: 27.3 pg (ref 26.0–34.0)
MCHC: 31.2 g/dL (ref 30.0–36.0)
MCV: 87.6 fL (ref 78.0–100.0)
PLATELETS: 161 10*3/uL (ref 150–400)
RBC: 4.5 MIL/uL (ref 4.22–5.81)
RDW: 19.6 % — AB (ref 11.5–15.5)
WBC: 9.5 10*3/uL (ref 4.0–10.5)

## 2015-12-04 LAB — LIPID PANEL
Cholesterol: 246 mg/dL — ABNORMAL HIGH (ref 0–200)
HDL: 79 mg/dL (ref >40)
LDL CALC: 147 mg/dL — AB (ref 0–99)
Total CHOL/HDL Ratio: 3.1 RATIO
Triglycerides: 100 mg/dL (ref <150)
VLDL: 20 mg/dL (ref 0–40)

## 2015-12-04 LAB — I-STAT VENOUS BLOOD GAS, ED
Acid-base deficit: 3 mmol/L — ABNORMAL HIGH (ref 0.0–2.0)
Bicarbonate: 22.8 mEq/L (ref 20.0–24.0)
O2 SAT: 66 %
PCO2 VEN: 40.9 mmHg — AB (ref 45.0–50.0)
PO2 VEN: 36 mmHg (ref 30.0–45.0)
TCO2: 24 mmol/L (ref 0–100)
pH, Ven: 7.355 — ABNORMAL HIGH (ref 7.250–7.300)

## 2015-12-04 LAB — I-STAT CHEM 8, ED
BUN: 92 mg/dL — AB (ref 6–20)
Calcium, Ion: 0.88 mmol/L — ABNORMAL LOW (ref 1.13–1.30)
Chloride: 96 mmol/L — ABNORMAL LOW (ref 101–111)
Creatinine, Ser: 8.4 mg/dL — ABNORMAL HIGH (ref 0.61–1.24)
Glucose, Bld: 94 mg/dL (ref 65–99)
HEMATOCRIT: 42 % (ref 39.0–52.0)
HEMOGLOBIN: 14.3 g/dL (ref 13.0–17.0)
Potassium: 5.9 mmol/L — ABNORMAL HIGH (ref 3.5–5.1)
SODIUM: 132 mmol/L — AB (ref 135–145)
TCO2: 22 mmol/L (ref 0–100)

## 2015-12-04 LAB — PROTIME-INR
INR: 1.25 (ref 0.00–1.49)
PROTHROMBIN TIME: 15.9 s — AB (ref 11.6–15.2)

## 2015-12-04 LAB — I-STAT TROPONIN, ED: TROPONIN I, POC: 0.02 ng/mL (ref 0.00–0.08)

## 2015-12-04 LAB — APTT: aPTT: 28 seconds (ref 24–37)

## 2015-12-04 LAB — ETHANOL

## 2015-12-04 LAB — MAGNESIUM: MAGNESIUM: 2.4 mg/dL (ref 1.7–2.4)

## 2015-12-04 MED ORDER — DEXAMETHASONE 4 MG PO TABS
4.0000 mg | ORAL_TABLET | Freq: Two times a day (BID) | ORAL | Status: DC
  Administered 2015-12-04 – 2015-12-05 (×2): 4 mg via ORAL
  Filled 2015-12-04 (×4): qty 1

## 2015-12-04 MED ORDER — PENTAFLUOROPROP-TETRAFLUOROETH EX AERO: 1.0000 "application " | INHALATION_SPRAY | CUTANEOUS | Status: DC | PRN

## 2015-12-04 MED ORDER — GABAPENTIN 100 MG PO CAPS
100.0000 mg | ORAL_CAPSULE | Freq: Two times a day (BID) | ORAL | Status: DC
  Administered 2015-12-04 – 2015-12-05 (×3): 100 mg via ORAL
  Filled 2015-12-04 (×3): qty 1

## 2015-12-04 MED ORDER — SODIUM CHLORIDE 0.9 % IV SOLN: 100.0000 mL | INTRAVENOUS | Status: DC | PRN

## 2015-12-04 MED ORDER — STROKE: EARLY STAGES OF RECOVERY BOOK
Freq: Once | Status: AC
  Administered 2015-12-04: 14:00:00
  Filled 2015-12-04: qty 1

## 2015-12-04 MED ORDER — TAMSULOSIN HCL 0.4 MG PO CAPS
0.4000 mg | ORAL_CAPSULE | Freq: Every day | ORAL | Status: DC
  Administered 2015-12-04: 0.4 mg via ORAL
  Filled 2015-12-04: qty 1

## 2015-12-04 MED ORDER — HYDROCODONE-ACETAMINOPHEN 5-325 MG PO TABS
1.0000 | ORAL_TABLET | Freq: Four times a day (QID) | ORAL | Status: DC | PRN
  Administered 2015-12-04 – 2015-12-05 (×2): 2 via ORAL
  Filled 2015-12-04: qty 2

## 2015-12-04 MED ORDER — ASPIRIN 300 MG RE SUPP: 300.0000 mg | Freq: Every day | RECTAL | Status: DC

## 2015-12-04 MED ORDER — PANTOPRAZOLE SODIUM 40 MG PO TBEC
40.0000 mg | DELAYED_RELEASE_TABLET | Freq: Every day | ORAL | Status: DC
  Administered 2015-12-04 – 2015-12-05 (×2): 40 mg via ORAL
  Filled 2015-12-04 (×2): qty 1

## 2015-12-04 MED ORDER — METOPROLOL TARTRATE 25 MG PO TABS
25.0000 mg | ORAL_TABLET | Freq: Two times a day (BID) | ORAL | Status: DC
  Administered 2015-12-04 – 2015-12-05 (×2): 25 mg via ORAL
  Filled 2015-12-04 (×3): qty 1

## 2015-12-04 MED ORDER — IOHEXOL 350 MG/ML SOLN
50.0000 mL | Freq: Once | INTRAVENOUS | Status: AC | PRN
  Administered 2015-12-04: 50 mL via INTRAVENOUS

## 2015-12-04 MED ORDER — LIDOCAINE-PRILOCAINE 2.5-2.5 % EX CREA
1.0000 "application " | TOPICAL_CREAM | CUTANEOUS | Status: DC | PRN
  Filled 2015-12-04: qty 5

## 2015-12-04 MED ORDER — ASPIRIN 325 MG PO TABS
325.0000 mg | ORAL_TABLET | Freq: Every day | ORAL | Status: DC
  Administered 2015-12-04: 325 mg via ORAL
  Filled 2015-12-04 (×2): qty 1

## 2015-12-04 MED ORDER — METOPROLOL TARTRATE 25 MG PO TABS: 25.0000 mg | ORAL_TABLET | Freq: Two times a day (BID) | ORAL | Status: DC

## 2015-12-04 MED ORDER — HEPARIN SODIUM (PORCINE) 1000 UNIT/ML DIALYSIS: 1000.0000 [IU] | INTRAMUSCULAR | Status: DC | PRN

## 2015-12-04 MED ORDER — HYDROCODONE-ACETAMINOPHEN 5-325 MG PO TABS
ORAL_TABLET | ORAL | Status: AC
  Administered 2015-12-04: 19:00:00
  Filled 2015-12-04: qty 2

## 2015-12-04 MED ORDER — POLYETHYLENE GLYCOL 3350 17 G PO PACK
17.0000 g | PACK | Freq: Every day | ORAL | Status: DC
  Administered 2015-12-05: 17 g via ORAL
  Filled 2015-12-04 (×2): qty 1

## 2015-12-04 MED ORDER — LIDOCAINE HCL (PF) 1 % IJ SOLN
5.0000 mL | INTRAMUSCULAR | Status: DC | PRN
  Filled 2015-12-04: qty 5

## 2015-12-04 MED ORDER — HEPARIN SODIUM (PORCINE) 5000 UNIT/ML IJ SOLN
5000.0000 [IU] | Freq: Three times a day (TID) | INTRAMUSCULAR | Status: DC
  Administered 2015-12-04 – 2015-12-05 (×3): 5000 [IU] via SUBCUTANEOUS
  Filled 2015-12-04 (×3): qty 1

## 2015-12-04 MED ORDER — SENNOSIDES-DOCUSATE SODIUM 8.6-50 MG PO TABS: 1.0000 | ORAL_TABLET | Freq: Every evening | ORAL | Status: DC | PRN

## 2015-12-04 NOTE — Telephone Encounter (Signed)
Working on getting appts scheduled, have not contact pt- as I am working on moving appts around to get everything on same day

## 2015-12-04 NOTE — ED Notes (Signed)
Pt in from home via [facility] per report pt LSN 6:40, pt rcvd CPR by wife, per EMS the pt was attempting to push away while wife was attempting CPR, pt rcvd last HD Saturday with HD due today, pt rcvd 2 mg Narcan, upon EMS arrival pt had pinpoint pupils with snoring respirations, pt responsive to pain only, pt reported to have unilateral R weakness with R sided facial droop, pt aphasic, pt more responsive in route, pt now following commands, pt rcvd Hydrocodone 5-325 mg by wife and a Keiffer cracker by wife, pt required suction d/t rhonchi when EMS arrived

## 2015-12-04 NOTE — ED Notes (Signed)
Laneta Simmers, MD at bedside, does not want to call code stroke at this time

## 2015-12-04 NOTE — ED Notes (Signed)
Patient undressed, in gown, on monitor, continuous pulse oximetry and blood pressure cuff 

## 2015-12-04 NOTE — ED Notes (Signed)
Pts spouse states, "I gave him some water. Can he eat?" spouse encouraged to keep pt NPO until a swallow screen is completed, spouse verbalizes understanding, [facility], RN informed

## 2015-12-04 NOTE — ED Notes (Signed)
Warm blanket given; Danelle Berry, RN at bedside

## 2015-12-04 NOTE — Progress Notes (Signed)
Palliative Medicine RN Note: Came to see/eval pt. Wife states renal recommended inpt hospice, and she is in agreement. She states she has talked to family members and prayed hard, and that this is the answer God led her to. Order was put in earlier today for residential hospice, and SW is aware. Please re-consult PMT if needed; however, all decisions were made prior to my arrival and family denies any questions at this time. Larina Earthly, RN, BSN, [facility] 12/04/2015 2:40 PM Cell [phone] 8:00-4:00 Monday-Friday Office [phone]

## 2015-12-04 NOTE — H&P (Signed)
[facility] History and Physical  Tim Kirby [id number] DOB: [birth year] DOA: 12/04/2015  Referring physician: Laneta Simmers PCP: [name], MD   Chief Complaint: acute encephalopathy  HPI: Tim Kirby is a 75 y.o. male 's medical history with a past medical history of hypertension, paroxysmal atrial fib, end-stage renal disease, prostate cancer with metastasis to the bone is in the emergency department with acute encephalopathy with right-sided weakness and right facial droop. This evaluation concerning for stroke.  Information is obtained from the wife is at the bedside. He reports he was in his usual state of health this morning when he first awakened which includes some mild lethargy and left facial twitch which she says is typical presentation on dialysis today. She presented him with his medications and then lift which extended to his entire upper body. She reports his head "eyes rolled back into his head" he was nonverbal did not follow commands. He reports his respirations became "snoring-like". She called 911 who according to her recommended she start compressions. States during compressions he became more alert and tried to push his hand away. The mass he received 2 mg Narcan appeared to have right-sided weakness with a right facial droop and was somewhat aphasic. Became more responsive en route.  In the emergency department he is afebrile hemodynamically stable and not hypoxic. He was evaluated by neurology recommend observation admission for stroke workup and then EEG.    Review of Systems:  10 point review of systems complete and all systems are negative except as indicated in the history of present illness  Past Medical History  Diagnosis Date  . Hyperlipidemia   . Gout   . Secondary hyperparathyroidism ([place])   . Anemia   . Colon, diverticulosis   . Thyroid nodule   . Anginal pain ([place])     sees Dr. Lia Foyer  . Hypertension     sees Dr. Willy Eddy  .  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 Upper respiratory infection April 2016  . CHF (congestive heart failure) ([place])   . 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 ESRD (end stage renal disease) on dialysis ([place])     "TTS; " (06/15/2015)  . Renal insufficiency   . Cancer [facility])    Past Surgical History  Procedure Laterality Date  . Knee surgery Left     cyst removal by Dr. Lorin Mercy  . Av fistula placement Left     left UA AVF  . Femoral-popliteal bypass graft  10/18/2012    Procedure: BYPASS GRAFT FEMORAL-POPLITEAL ARTERY;  Surgeon: Conrad [place]ch[place], MD;  Location: [facility] OR;  Service: Vascular;  Laterality: Right;  Right Popliteal Aneurysm Exclusion; Ultrasound guided  . False aneurysm repair Left 12/29/2012    Procedure: REPAIR FALSE ANEURYSM;  Surgeon: Conrad Bradbury, MD;  Location: [place];  Service: Vascular;  Laterality: Left;  . Endovascular repair of popliteal artery aneurysm Left 12/29/2012    Procedure: ENDOVASCULAR REPAIR OF POPLITEAL AND FEMORAL ARTERY ANEURYSM;  Surgeon: Conrad [place], MD;  Location: [place];  Service: Vascular;  Laterality: Left;  . Abdominal aortagram N/A 09/27/2012    Procedure: ABDOMINAL Maxcine Ham;  Surgeon: Angelia Mould, MD;  Location: [facility] CATH LAB;  Service: Cardiovascular;  Laterality: N/A;  . Tonsillectomy and adenoidectomy     Social History:  reports that he quit smoking about 8 years ago. His smoking use included Pipe, Cigars, and Cigarettes. He quit after 50 years of use. He has never used smokeless  tobacco. He reports that he drinks alcohol. He reports that he does not use illicit drugs.  Allergies  Allergen Reactions  . Denosumab Other (See Comments)    Severe symptomatic hypocalcemia 1 week after 120 mg SQ denosumab. Should be avoided completely.     Family History  Problem Relation Age of Onset  . Hypertension Mother   . Diabetes Mother   . Diabetes Brother      Prior to Admission medications   Medication Sig Start Date End Date Taking?  Authorizing Provider  dexamethasone (DECADRON) 4 MG tablet Take 4 mg by mouth 2 (two) times daily with a meal.   Yes Historical Provider, MD  gabapentin (NEURONTIN) 100 MG capsule Take 1 capsule (100 mg total) by mouth 2 (two) times daily. 06/28/15  Yes Patrecia Pour, MD  HYDROcodone-acetaminophen (NORCO/VICODIN) 5-325 MG tablet Take 1-2 tablets by mouth every 6 (six) hours as needed for severe pain (be aware this can make him drowsy so only take after tylenol. and limit total tylenol dose to 4000mg /day). 10/04/15  Yes Loleta Chance, MD  metoprolol (LOPRESSOR) 50 MG tablet Take 1 tablet (50 mg total) by mouth 2 (two) times daily. Patient taking differently: Take 25 mg by mouth 2 (two) times daily. 25 mg twice daily 06/28/15  Yes Patrecia Pour, MD  multivitamin (RENA-VIT) TABS tablet Take 1 tablet by mouth daily.   Yes Historical Provider, MD  omeprazole (PRILOSEC) 40 MG capsule Take 40 mg by mouth daily.   Yes Historical Provider, MD  ondansetron (ZOFRAN) 8 MG tablet Take 8 mg by mouth every 8 (eight) hours as needed for nausea or vomiting.   Yes Historical Provider, MD  tamsulosin (FLOMAX) 0.4 MG CAPS capsule Take 0.4 mg by mouth at bedtime. 07/09/15  Yes Historical Provider, MD   Physical Exam: Filed Vitals:   12/04/15 0815 12/04/15 0830 12/04/15 0904 12/04/15 0915  BP: 132/95 134/96 142/81 122/87  Pulse: 99 89 81   Temp:      TempSrc:      Resp: 21 19 18 16   SpO2: 98% 100% 97% 99%    Wt Readings from Last 3 Encounters:  10/04/15 70.8 kg (156 lb 1.4 oz)  07/24/15 72.576 kg (160 lb)  07/14/15 75.8 kg (167 lb 1.7 oz)    General:  Appears calm and comfortable, lethargic but responsive Eyes: PERRL, normal lids, irises & conjunctiva ENT: grossly normal hearing, lips & tongue dried blood around his lips and on his tongue Neck: no LAD, masses or thyromegaly Cardiovascular: Irregularly irregular, no m/r/g. No LE edema. Telemetry: SR, no arrhythmias  Respiratory: No increased work. Somewhat  shallow. Breath sounds clear I hear no crackles or wheezing Abdomen: soft, ntnd positive bowel sounds Skin: no rash or induration seen on limited exam Musculoskeletal: grossly normal tone BUE/BLE Psychiatric: grossly normal mood and affect, speech fluent and appropriate Neurologic: grossly non-focal. Speech clear facial symmetry  cheek twitching occasionally. Bilateral grip 4 out of 5 lower extremity strength 4 out of 5 bilaterally tongue midline           Labs on Admission:  Basic Metabolic Panel:  Recent Labs Lab 12/04/15 0815 12/04/15 0818 12/04/15 0824  NA  --  136 132*  K  --  6.2* 5.9*  CL  --  92* 96*  CO2  --  22  --   GLUCOSE  --  98 94  BUN  --  98* 92*  CREATININE  --  8.90* 8.40*  CALCIUM  --  9.0  --   MG 2.4  --   --    Liver Function Tests:  Recent Labs Lab 12/04/15 0818  AST 28  ALT 13*  ALKPHOS 2276*  BILITOT 0.7  PROT 6.1*  ALBUMIN 3.4*   No results for input(s): LIPASE, AMYLASE in the last 168 hours. No results for input(s): AMMONIA in the last 168 hours. CBC:  Recent Labs Lab 12/04/15 0818 12/04/15 0824  WBC 9.5  --   NEUTROABS 6.9  --   HGB 12.3* 14.3  HCT 39.4 42.0  MCV 87.6  --   PLT 161  --    Cardiac Enzymes: No results for input(s): CKTOTAL, CKMB, CKMBINDEX, TROPONINI in the last 168 hours.  BNP (last 3 results)  Recent Labs  04/26/15 2046 10/02/15 1550  BNP 1518.4* 3380.0*    ProBNP (last 3 results) No results for input(s): PROBNP in the last 8760 hours.  CBG: No results for input(s): GLUCAP in the last 168 hours.  Radiological Exams on Admission: Ct Head Wo Contrast  12/04/2015  CLINICAL DATA:  75 year old male unresponsive after taking morning medication. Aphasia. Symptoms have since resolved. Code stroke. EXAM: CT HEAD WITHOUT CONTRAST TECHNIQUE: Contiguous axial images were obtained from the base of the skull through the vertex without intravenous contrast. COMPARISON:  Head CT 10/02/2015. FINDINGS: Mild cerebral  and cerebellar atrophy. Patchy and confluent areas of decreased attenuation are noted throughout the deep and periventricular white matter of the cerebral hemispheres bilaterally, compatible with chronic microvascular ischemic disease. Extensive atherosclerotic calcifications throughout the visualized intracranial vasculature. No acute intracranial abnormalities. Specifically, no evidence of acute intracranial hemorrhage, no definite findings of acute/subacute cerebral ischemia, no mass, mass effect, hydrocephalus or abnormal intra or extra-axial fluid collections. Visualized paranasal sinuses and mastoids are well pneumatized. No acute displaced skull fractures are identified. IMPRESSION: 1. No acute intracranial abnormalities. 2. Mild cerebral and cerebellar atrophy with extensive chronic microvascular ischemic changes in cerebral white matter. 3. Severe atherosclerosis. These results were called by telephone at the time of interpretation on 12/04/2015 at 8:45 am to Dr. Leo Grosser, who verbally acknowledged these results. Electronically Signed   By: Vinnie Langton M.D.   On: 12/04/2015 08:46    EKG: Independently reviewed. Atrial flutter with predominant 2:1 AV block Borderline repol abnormality, diffuse leads Borderline prolonged QT interval Atrial flutter New since previous tracing  Assessment/Plan Principal Problem:   Stroke-like symptoms Active Problems:   Essential hypertension   End stage renal disease (HCC)   AF (paroxysmal atrial fibrillation) (HCC)   CAD in native artery   Chronic diastolic CHF (congestive heart failure) (HCC)   Acute encephalopathy  #1. Strokelike symptoms. Patient with some right-sided weakness with facial droop and aphasia concerning for stroke. Also concern for seizure versus metabolic encephalopathy. History of A. fib hypertension hyperlipidemia. No code stroke called. See normal 6:40 AM. CT of the head no acute intracranial abnormalities. Symptoms much improved  on admission. Evaluated by neurology -Admit to telemetry -CTA of head and neck per neurology -EEG per neurology -MRI MRA of the brain -Echocardiogram -Lipid panel -Globin A1c -Bedside swallow eval -Physical therapy/ speech therapy -Aspirin -consider statin  #2. Acute encephalopathy. Post ictal versus metabolic. creatinine greater than 5 which higher than his baseline on dialysis day. No indications of infection. No hypoxia.  Wife reports he usually become somewhat lethargic with the left cheek with on dialysis days. CT of the head without acute abnormalities. Symptoms improving on admission -EEG per neurology -Nephrology consult for dialysis -  See #1.  3. Paroxysmal A. Fib. EKG A flutter 2:1. Denies chest pain [place] score 4. No chest pain. Rate controlled. Not on any anticoagulation. -Hold beta blocker -Monitor independently  #4. End Stage renal disease. Patient is a Tuesday Thursday Saturday dialysis patient. Creatinine currently 6.2.  -Nephrology consulted  5. Hypertension. Controlled in the emergency department. home medications include Toprol. -Monitor -Resume when indicated  #6. Metastatic prostate cancer. Diagnosed in October 2016. Followed and aspirin. An MRI 2 months ago indicated no brain metastases. Palliative care discussed and family may be open to hospice care.  -Continue Decadron -tomsulosin -palliative care consult  [place] neurology nephrology  Code Status: DNR DVT Prophylaxis: Family Communication: wife at bedside Disposition Plan: home hopefully 24 hours  Time spent: 96 minutes  [facility]

## 2015-12-04 NOTE — ED Provider Notes (Signed)
CSN: [id number]     Arrival date & time 12/04/15  0759 History   First MD Initiated Contact with Patient 12/04/15 0800     Chief Complaint  Patient presents with  . Altered Mental Status     (Consider location/radiation/quality/duration/timing/severity/associated sxs/prior Treatment) Patient is a 75 y.o. male presenting with altered mental status. The history is provided by a relative.  Altered Mental Status Presenting symptoms: behavior changes, confusion, lethargy and unresponsiveness   Severity:  Moderate Most recent episode:  Today Episode history:  Single Duration:  1 hour Timing:  Constant Progression:  Partially resolved Chronicity:  New Context comment:  Cancer diagnosis, ESRD on dialysis Associated symptoms: slurred speech     Past Medical History  Diagnosis Date  . Hyperlipidemia   . Gout   . Secondary hyperparathyroidism ([place])   . Anemia   . Colon, diverticulosis   . Thyroid nodule   . Anginal pain ([place])     sees Dr. Lia Foyer  . Hypertension     sees Dr. Willy Eddy  . 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 Upper respiratory infection April 2016  . CHF (congestive heart failure) ([place])   . 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 ESRD (end stage renal disease) on dialysis ([place])     "TTS; [place]" (06/15/2015)  . Renal insufficiency   . Cancer [facility])    Past Surgical History  Procedure Laterality Date  . Knee surgery Left     cyst removal by Dr. Lorin Mercy  . Av fistula placement Left     left UA AVF  . Femoral-popliteal bypass graft  10/18/2012    Procedure: BYPASS GRAFT FEMORAL-POPLITEAL ARTERY;  Surgeon: Conrad Dalton, MD;  Location: [facility] OR;  Service: Vascular;  Laterality: Right;  Right Popliteal Aneurysm Exclusion; Ultrasound guided  . False aneurysm repair Left 12/29/2012    Procedure: REPAIR FALSE ANEURYSM;  Surgeon: Conrad Zeeland, MD;  Location: [place];  Service: Vascular;  Laterality: Left;  . Endovascular repair of popliteal artery aneurysm Left 12/29/2012     Procedure: ENDOVASCULAR REPAIR OF POPLITEAL AND FEMORAL ARTERY ANEURYSM;  Surgeon: Conrad [place], MD;  Location: [place];  Service: Vascular;  Laterality: Left;  . Abdominal aortagram N/A 09/27/2012    Procedure: ABDOMINAL Maxcine Ham;  Surgeon: Angelia Mould, MD;  Location[facility] C[facility] LAB;  Service: Cardiovascular;  Laterality: N/A;  . Tonsillectomy and adenoidectomy     Family History  Problem Relation Age of Onset  . Hypertension Mother   . Diabetes Mother   . Diabetes Brother    Social History  Substance Use Topics  . Smoking status: Former Smoker -- 50 years    Types: Pipe, Cigars, Cigarettes    Quit date: 09/18/2007  . Smokeless tobacco: Never Used     Comment: "mostly smoked cigars and pipes"  . Alcohol Use: Yes     Comment: "stopped drinking in ~ 2008"    Review of Systems  Psychiatric/Behavioral: Positive for confusion.  All other systems reviewed and are negative.     Allergies  Denosumab  Home Medications   Prior to Admission medications   Medication Sig Start Date End Date Taking? Authorizing Provider  acetaminophen (TYLENOL) 325 MG tablet Take 1 tablet (325 mg total) by mouth every 4 (four) hours as needed for mild pain or moderate pain. Patient not taking: Reported on 10/22/2015 10/04/15   Loleta Chance, MD  bicalutamide (CASODEX) 50 MG tablet Take 1 tablet (50 mg total) by mouth daily.  Patient not taking: Reported on 11/20/2015 06/16/15   Milagros Loll, MD  calcium carbonate (TUMS - DOSED IN MG ELEMENTAL CALCIUM) 500 MG chewable tablet Chew 5 tablets (1,000 mg of elemental calcium total) by mouth 3 (three) times daily with meals. Patient not taking: Reported on 10/22/2015 07/14/15   Asiyah Cletis Media, MD  gabapentin (NEURONTIN) 100 MG capsule Take 1 capsule (100 mg total) by mouth 2 (two) times daily. 06/28/15   Patrecia Pour, MD  HYDROcodone-acetaminophen (NORCO/VICODIN) 5-325 MG tablet Take 1-2 tablets by mouth every 6 (six) hours as needed for severe pain (be  aware this can make him drowsy so only take after tylenol. and limit total tylenol dose to 4000mg /day). 10/04/15   Loleta Chance, MD  HYDROcodone-acetaminophen (NORCO/VICODIN) 5-325 MG tablet Take 1-2 tablets by mouth every 6 (six) hours as needed (breakthrough pain). Patient not taking: Reported on 10/11/2015 10/05/15   Loleta Chance, MD  metoprolol (LOPRESSOR) 50 MG tablet Take 1 tablet (50 mg total) by mouth 2 (two) times daily. Patient taking differently: Take 25 mg by mouth 2 (two) times daily. 25 mg twice daily 06/28/15   Patrecia Pour, MD  multivitamin (RENA-VIT) TABS tablet Take 1 tablet by mouth daily.    Historical Provider, MD  Nutritional Supplements (FEEDING SUPPLEMENT, NEPRO CARB STEADY,) LIQD Take 237 mLs by mouth 2 (two) times daily between meals. Patient not taking: Reported on 10/11/2015 10/04/15   Loleta Chance, MD  omeprazole (PRILOSEC) 40 MG capsule Take 40 mg by mouth daily.    Historical Provider, MD  polyethylene glycol (MIRALAX / GLYCOLAX) packet Take 17 g by mouth daily. 07/14/15   Asiyah Cletis Media, MD  tamsulosin (FLOMAX) 0.4 MG CAPS capsule Take 0.4 mg by mouth at bedtime. 07/09/15   Historical Provider, MD   BP 142/86 mmHg  Pulse 98  Temp(Src) 98.8 F (37.1 C) (Oral)  Resp 19  SpO2 97% Physical Exam  Constitutional: He is oriented to person, place, and time. He appears well-developed and well-nourished. He appears listless. No distress.  HENT:  Head: Normocephalic and atraumatic.  Eyes: Conjunctivae are normal.  Neck: Neck supple. No tracheal deviation present.  Cardiovascular: Normal rate, regular rhythm and normal heart sounds.   Pulmonary/Chest: Effort normal and breath sounds normal. No respiratory distress.  Abdominal: Soft. He exhibits no distension. There is no tenderness.  Neurological: He is oriented to person, place, and time. He appears listless. No sensory deficit. GCS eye subscore is 4. GCS verbal subscore is 5. GCS motor subscore is 6.  Mild right sided  facial droop, some weakness of left leg compared to right  Skin: Skin is warm and dry.  Psychiatric: His affect is blunt. His speech is slurred.  Vitals reviewed.   ED Course  Procedures (including critical care time)  CRITICAL CARE Performed by: Leo Grosser Total critical care time: 30 minutes Critical care time was exclusive of separately billable procedures and treating other patients. Critical care was necessary to treat or prevent imminent or life-threatening deterioration. Critical care was time spent personally by me on the following activities: development of treatment plan with patient and/or surrogate as well as nursing, discussions with consultants, evaluation of patient's response to treatment, examination of patient, obtaining history from patient or surrogate, ordering and performing treatments and interventions, ordering and review of laboratory studies, ordering and review of radiographic studies, pulse oximetry and re-evaluation of patient's condition.   Labs Review Labs Reviewed  PROTIME-INR - Abnormal; Notable for the following:  Prothrombin Time 15.9 (*)    All other components within normal limits  CBC - Abnormal; Notable for the following:    Hemoglobin 12.3 (*)    RDW 19.6 (*)    All other components within normal limits  COMPREHENSIVE METABOLIC PANEL - Abnormal; Notable for the following:    Potassium 6.2 (*)    Chloride 92 (*)    BUN 98 (*)    Creatinine, Ser 8.90 (*)    Total Protein 6.1 (*)    Albumin 3.4 (*)    ALT 13 (*)    Alkaline Phosphatase 2276 (*)    GFR calc non Af Amer 5 (*)    GFR calc Af Amer 6 (*)    Anion gap 22 (*)    All other components within normal limits  LIPID PANEL - Abnormal; Notable for the following:    Cholesterol 246 (*)    LDL Cholesterol 147 (*)    All other components within normal limits  I-STAT CHEM 8, ED - Abnormal; Notable for the following:    Sodium 132 (*)    Potassium 5.9 (*)    Chloride 96 (*)    BUN  92 (*)    Creatinine, Ser 8.40 (*)    Calcium, Ion 0.88 (*)    All other components within normal limits  I-STAT VENOUS BLOOD GAS, ED - Abnormal; Notable for the following:    pH, Ven 7.355 (*)    pCO2, Ven 40.9 (*)    Acid-base deficit 3.0 (*)    All other components within normal limits  ETHANOL  APTT  DIFFERENTIAL  MAGNESIUM  URINE RAPID DRUG SCREEN, HOSP PERFORMED  URINALYSIS, ROUTINE W REFLEX MICROSCOPIC (NOT AT [facility])  HEMOGLOBIN A1C  I-STAT TROPOININ, ED    Imaging Review Ct Angio Head W/cm &/or Wo Cm  12/04/2015  CLINICAL DATA:  Right-sided weakness and aphasia. Neurology reports global symptoms after narcotic. EXAM: CT ANGIOGRAPHY HEAD AND NECK TECHNIQUE: Multidetector CT imaging of the head and neck was performed using the standard protocol during bolus administration of intravenous contrast. Multiplanar CT image reconstructions and MIPs were obtained to evaluate the vascular anatomy. Carotid stenosis measurements (when applicable) are obtained utilizing NASCET criteria, using the distal internal carotid diameter as the denominator. CONTRAST:  56mL OMNIPAQUE IOHEXOL 350 MG/ML SOLN COMPARISON:  Head CT from earlier today.  Brain MRI 10/01/2016. FINDINGS: Poorly opacified vessels, likely from poor cardiac output and small bolus. CTA NECK Aortic arch: Arteriomegaly without evidence of dissection. Diffuse atherosclerotic calcification. Two vessel branching. Right carotid system: Bulky atherosclerosis at the bifurcation without flow limiting stenosis. No evidence of dissection. Left carotid system: Bulky atherosclerosis at the bifurcation without flow limiting stenosis. No evidence of dissection. Vertebral arteries:Right dominant. No flow limiting subclavian or brachiocephalic stenosis. Plaque at the origins without suspected flow limiting stenosis. Skeleton: Diffuse skeletal metastases from prostate cancer. There may also be superimposed renal osteodystrophy with multiple endplate erosions.  There is extraosseous tumor growth subpleurally from the anterior left and right second ribs. Other neck: Layering pleural effusions. Enlarged main pulmonary artery and cardiomegaly. Atherosclerosis. CTA HEAD Anterior circulation: Diffuse atherosclerotic calcification on the ectatic supraclinoid ICA and proximal M1 segments. Outer wall diameter measures 9 mm at most on the right - some of this diameter is related to calcified wall thickening. There is to moderate narrowing in the left mid M1 segment due to heavily calcified plaque. There are at least 2 high-grade left proximal M2 segment stenoses. There is a short segment  high-grade right proximal into stenosis just beyond the bifurcation. Aplastic left A1 segment with anterior communicating artery. Diffuse atherosclerotic irregularity of the proximal ACA circulation. Posterior circulation: Right dominant. Dominant right AICA and left PICA. Irregular plaque at the vertebrobasilar junction with high-grade left distal V4 segment and vertebrobasilar junction stenosis. Severe stenosis of the left P1 segment, but large posterior communicating artery which serves the left PCA. Venous sinuses: Patent Delayed phase: High-density mass measuring 1 cm in thickness along the posterior right frontal convexity measuring 33 mm in length. This could reflect a hematoma or mass lesion; mass lesion favored given nodular appearance in this area on 2016 brain MRI, and the extensive sclerotic metastatic disease. These results were called by telephone at the time of interpretation on 12/04/2015 at 10:46 am to Dr. Silverio Decamp, who verbally acknowledged these results. IMPRESSION: 1. No proximal occlusion. 2. Advanced intracranial atherosclerosis with dilated M1 segments and moderate left distal M1 stenosis. Left more than right M2 high-grade stenoses. 3. Critical stenosis of the non dominant distal left V4 segment. Moderate proximal basilar stenosis. Critical left P1 stenosis but large  posterior communicating artery. 4. 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 5. Prostate cancer with diffuse skeletal metastases. 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 Electronically Signed   By: Monte Fantasia M.D.   On: 12/04/2015 11:10   Dg Chest 2 View  12/04/2015  CLINICAL DATA:  Stroke. History of prostate cancer in atrial for ablation. EXAM: CHEST  2 VIEW COMPARISON:  10/02/2015 FINDINGS: Moderate cardiac enlargement. Aortic atherosclerosis noted. Chronic appearing coarsened interstitial markings are noted bilaterally. No pleural effusion edema or airspace consolidation. Diffuse sclerotic skeletal metastasis identified. IMPRESSION: 1. Cardiac enlargement and aortic atherosclerosis 2. Widespread osseous metastasis. Electronically Signed   By: Kerby Moors M.D.   On: 12/04/2015 11:57   Ct Head Wo Contrast  12/04/2015  CLINICAL DATA:  75 year old male unresponsive after taking morning medication. Aphasia. Symptoms have since resolved. Code stroke. EXAM: CT HEAD WITHOUT CONTRAST TECHNIQUE: Contiguous axial images were obtained from the base of the skull through the vertex without intravenous contrast. COMPARISON:  Head CT 10/02/2015. FINDINGS: Mild cerebral and cerebellar atrophy. Patchy and confluent areas of decreased attenuation are noted throughout the deep and periventricular white matter of the cerebral hemispheres bilaterally, compatible with chronic microvascular ischemic disease. Extensive atherosclerotic calcifications throughout the visualized intracranial vasculature. No acute intracranial abnormalities. Specifically, no evidence of acute intracranial hemorrhage, no definite findings of acute/subacute cerebral ischemia, no mass, mass effect, hydrocephalus or abnormal intra or extra-axial fluid collections. Visualized paranasal sinuses and  mastoids are well pneumatized. No acute displaced skull fractures are identified. IMPRESSION: 1. No acute intracranial abnormalities. 2. Mild cerebral and cerebellar atrophy with extensive chronic microvascular ischemic changes in cerebral white matter. 3. Severe atherosclerosis. These results were called by telephone at the time of interpretation on 12/04/2015 at 8:45 am to Dr. Leo Grosser, who verbally acknowledged these results. Electronically Signed   By: Vinnie Langton M.D.   On: 12/04/2015 08:46   Ct Angio Neck W/cm &/or Wo/cm  12/04/2015  CLINICAL DATA:  Right-sided weakness and aphasia. Neurology reports global symptoms after narcotic. EXAM: CT ANGIOGRAPHY HEAD AND NECK TECHNIQUE: Multidetector CT imaging of the head and neck was performed using the standard protocol during bolus administration of intravenous contrast. Multiplanar CT image reconstructions and MIPs were obtained to evaluate the vascular anatomy. Carotid stenosis measurements (when applicable) are obtained  utilizing NASCET criteria, using the distal internal carotid diameter as the denominator. CONTRAST:  48mL OMNIPAQUE IOHEXOL 350 MG/ML SOLN COMPARISON:  Head CT from earlier today.  Brain MRI 10/01/2016. FINDINGS: Poorly opacified vessels, likely from poor cardiac output and small bolus. CTA NECK Aortic arch: Arteriomegaly without evidence of dissection. Diffuse atherosclerotic calcification. Two vessel branching. Right carotid system: Bulky atherosclerosis at the bifurcation without flow limiting stenosis. No evidence of dissection. Left carotid system: Bulky atherosclerosis at the bifurcation without flow limiting stenosis. No evidence of dissection. Vertebral arteries:Right dominant. No flow limiting subclavian or brachiocephalic stenosis. Plaque at the origins without suspected flow limiting stenosis. Skeleton: Diffuse skeletal metastases from prostate cancer. There may also be superimposed renal osteodystrophy with multiple endplate  erosions. There is extraosseous tumor growth subpleurally from the anterior left and right second ribs. Other neck: Layering pleural effusions. Enlarged main pulmonary artery and cardiomegaly. Atherosclerosis. CTA HEAD Anterior circulation: Diffuse atherosclerotic calcification on the ectatic supraclinoid ICA and proximal M1 segments. Outer wall diameter measures 9 mm at most on the right - some of this diameter is related to calcified wall thickening. There is to moderate narrowing in the left mid M1 segment due to heavily calcified plaque. There are at least 2 high-grade left proximal M2 segment stenoses. There is a short segment high-grade right proximal into stenosis just beyond the bifurcation. Aplastic left A1 segment with anterior communicating artery. Diffuse atherosclerotic irregularity of the proximal ACA circulation. Posterior circulation: Right dominant. Dominant right AICA and left PICA. Irregular plaque at the vertebrobasilar junction with high-grade left distal V4 segment and vertebrobasilar junction stenosis. Severe stenosis of the left P1 segment, but large posterior communicating artery which serves the left PCA. Venous sinuses: Patent Delayed phase: High-density mass measuring 1 cm in thickness along the posterior right frontal convexity measuring 33 mm in length. This could reflect a hematoma or mass lesion; mass lesion favored given nodular appearance in this area on 2016 brain MRI, and the extensive sclerotic metastatic disease. These results were called by telephone at the time of interpretation on 12/04/2015 at 10:46 am to Dr. Silverio Decamp, who verbally acknowledged these results. IMPRESSION: 1. No proximal occlusion. 2. Advanced intracranial atherosclerosis with dilated M1 segments and moderate left distal M1 stenosis. Left more than right M2 high-grade stenoses. 3. Critical stenosis of the non dominant distal left V4 segment. Moderate proximal basilar stenosis. Critical left P1 stenosis but  large posterior communicating artery. 4. 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 5. Prostate cancer with diffuse skeletal metastases. 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 Electronically Signed   By: Monte Fantasia M.D.   On: 12/04/2015 11:10   Mr Brain Wo Contrast  12/04/2015  CLINICAL DATA:  Stroke-like symptoms EXAM: MRI HEAD WITHOUT CONTRAST TECHNIQUE: Multiplanar, multiecho pulse sequences of the brain and surrounding structures were obtained without intravenous contrast. COMPARISON:  CT based imaging from earlier today FINDINGS: Motion degraded study, best obtainable due to patient condition. Calvarium and upper cervical spine: Diffusely hypo intense marrow attributed to known prostate cancer metastases and renal osteodystrophy. Orbits: Negative. Sinuses and Mastoids: Bilateral mastoid fluid. Brain: No acute infarct. The plaque-like dural based abnormality over the right posterior frontal convexity as described on previous head CT is re- demonstrated, measuring up to 14 mm in thickness and 42 mm in diameter. There is additional separate nodular areas around the high right frontal convexity on coronal acquisition. This material is  hypo intense on T2 weighted imaging. Without contrast, cannot distinguish between the differential considerations of hematoma or dural based mass. As noted on previous head CT, dural based mass is favored given lobulated morphology, rib findings on prior neck CTA, and growing nodular appearance on brain MRI from 2016. The right frontal subdural hematoma noted at that time has nearly resolved, with mild dural thickening. No hydrocephalus. Ectatic distal ICA and proximal M1 segments as described on CTA. IMPRESSION: 1. No acute infarct. 2. Right posterior frontal extra-axial abnormality which is larger than in 2016. Dural-based  metastasis is favored over hematoma as reasoned above. Abnormality is indeterminate without intravenous contrast in this patient with end-stage renal disease and surveillance head CT or head CT with and without contrast is recommended. 3. Right frontal dural thickening is smooth, and likely sequela of subdural hematoma noted December 2016. 4. 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 Electronically Signed   By: Monte Fantasia M.D.   On: 12/04/2015 11:49   I have personally reviewed and evaluated these images and lab results as part of my medical decision-making.   EKG Interpretation   Date/Time:  Tuesday December 04 2015 08:04:45 EST Ventricular Rate:  104 PR Interval:    QRS Duration: 91 QT Interval:  372 QTC Calculation: 489 R Axis:   54 Text Interpretation:  Atrial flutter with predominant 2:1 AV block  Borderline repol abnormality, diffuse leads Borderline prolonged QT  interval Atrial flutter New since previous tracing Confirmed by Rayquan Amrhein MD,  Zachary Nole [id number]) on 12/04/2015 8:17:08 AM      MDM   Final diagnoses:  Chronic diastolic CHF (congestive heart failure) (HCC)  Encephalopathy acute  Hyperkalemia  Atrial flutter, unspecified type ([place])  End stage renal disease on dialysis [facility])   75 y.o. male presents with new onset confusion this morning after taking a hydrocodone for ongoing back pain related to bony metastatic disease. He was unresponsive after being found by his wife this morning, last seen normal at 6:40 AM. 911 operator instructed the wife to perform compressions which she did briefly which caused him to wake up. On arrival of EMS the patient has right facial droop and bilateral leg weakness. He began resolving some of his speech in route and continues to be slurred with persistent deficits. Of note the patient is end-stage renal disease and often has a similar appearance after weekends prior to his Tuesday dialysis  sessions. New findings today include new onset atrial flutter which the patient has not had before. This may be related to his metabolic state but is an independent risk factor for thrombus formation and ischemic stroke so code stroke was initiated at [place]. Considered TPA but patient appears to be resolving symptoms and would not be a good candidate. Neurology was available at bedside and is recommending CTA for evaluation of vessels. I discussed the risk and benefit of contrasted study with Dr. Justin Mend of nephrology who will help arrange inpatient dialysis. The patient does have hyperkalemia to 6.2 without EKG changes so we will hold off on temporizing but monitor closely as plan is to get dialysis later today.  Hospitalist was consulted for admission for further evaluation of altered mental status and new onset flutter which should e helped by dialysis and will see the patient in the emergency department.     Leo Grosser, MD 12/04/15 [phone]

## 2015-12-04 NOTE — Progress Notes (Signed)
CSW spoke with patient's wife. Patient's wife is requesting [facility] (residential). CSW faxed referral to facility. CSW to continue to follow for discharge needs.  Percell Locus Pamla Pangle LCSWA [phone]

## 2015-12-04 NOTE — Consult Note (Signed)
Tim Kirby 12/04/2015 [place] Agent Requesting Physician:  Marily Memos MD  Reason for Consult:  ESRD, AMS, Seizure, Metastatic Prostate Cancer HPI:  47M ESRD LFA AVF AKC with progressive failure to thrive and worsened metastatic prostate cancer, hx/o SDH, admitted with concern for seizure and progressive debility.  Found to have new brain met. Family / Wife have decided to pursue hospice/palliative measureas but wish to stabilize his hyperkalemia before so he can transition to outpt hospice.  They understand that with hospice he will stop HD.  All questions answered.    ROS Balance of 12 systems is negative w/ exceptions as above  Outpt HD Orders Unit: AK[facility] Days: THS Time: 4h Dialyzer: F160 EDW: 70.5kg K/Ca: 2/2 Access: LUE AVF Needle Size: 15g BFR/DFR: 450/1.5 UF Proflie: none; Na profiling linear VDRA: hectorol 79mg qWk EPO: mircera 225 q2wk Heparin: NONE with HD Most Recent Phos / PTH: 5.2 / 2435 Most Recent TSAT / Ferritin: 29 / 1245 Most Recent eKT/V: 1.38 Treatment Adherence: good  PMH  Past Medical History  Diagnosis Date  . Hyperlipidemia   . Gout   . Secondary hyperparathyroidism ([place]   . Anemia   . Colon, diverticulosis   . Thyroid nodule   . Anginal pain ([place]     sees Dr. SLia Foyer . Hypertension     sees Dr. MWilly Eddy . Aneurysm artery, popliteal ([place]   . Upper respiratory infection April 2016  . CHF (congestive heart failure) ([place]   . Aneurysm of right femoral artery ([place]   . ESRD (end stage renal disease) on dialysis ([place]     "TTS; [place]" (06/15/2015)  . Renal insufficiency   . Cancer ([place]    [facility] Past Surgical History  Procedure Laterality Date  . Knee surgery Left     cyst removal by Dr. YLorin Mercy . Av fistula placement Left     left UA AVF  . Femoral-popliteal bypass graft  10/18/2012    Procedure: BYPASS GRAFT FEMORAL-POPLITEAL ARTERY;  Surgeon: BConrad Truxton MD;  [facility];  Service: Vascular;  Laterality: Right;  Right  Popliteal Aneurysm Exclusion; Ultrasound guided  . False aneurysm repair Left 12/29/2012    Procedure: REPAIR FALSE ANEURYSM;  Surgeon: BConrad Joplin MD;  [place] [facility]  Service: Vascular;  Laterality: Left;  . Endovascular repair of popliteal artery aneurysm Left 12/29/2012    Procedure: ENDOVASCULAR REPAIR OF POPLITEAL AND FEMORAL ARTERY ANEURYSM;  Surgeon: BConrad La Grange MD;  [place]  Service: Vascular;  Laterality: Left;  . Abdominal aortagram N/A 09/27/2012    Procedure: ABDOMINAL AMaxcine Ham  Surgeon: CAngelia Mould MD;  [facility];  Service: Cardiovascular;  Laterality: N/A;  . Tonsillectomy and adenoidectomy     FH  Family History  Problem Relation Age of Onset  . Hypertension Mother   . Diabetes Mother   . Diabetes Brother    SH  reports that he quit smoking about 8 years ago. His smoking use included Pipe, Cigars, and Cigarettes. He quit after 50 years of use. He has never used smokeless tobacco. He reports that he drinks alcohol. He reports that he does not use illicit drugs. Allergies  Allergies  Allergen Reactions  . Denosumab Other (See Comments)    Severe symptomatic hypocalcemia 1 week after 120 mg SQ denosumab. Should be avoided completely.    Home medications Prior to Admission medications   Medication Sig Start Date End Date Taking? Authorizing Provider  dexamethasone (DECADRON)  4 MG tablet Take 4 mg by mouth 2 (two) times daily with a meal.   Yes Historical Provider, MD  gabapentin (NEURONTIN) 100 MG capsule Take 1 capsule (100 mg total) by mouth 2 (two) times daily. 06/28/15  Yes Patrecia Pour, MD  HYDROcodone-acetaminophen (NORCO/VICODIN) 5-325 MG tablet Take 1-2 tablets by mouth every 6 (six) hours as needed for severe pain (be aware this can make him drowsy so only take after tylenol. and limit total tylenol dose to 4053m/day). 10/04/15  Yes KLoleta Chance MD  metoprolol (LOPRESSOR) 50 MG tablet Take 1 tablet (50 mg total) by mouth 2 (two)  times daily. Patient taking differently: Take 25 mg by mouth 2 (two) times daily. 25 mg twice daily 06/28/15  Yes RPatrecia Pour MD  multivitamin (RENA-VIT) TABS tablet Take 1 tablet by mouth daily.   Yes Historical Provider, MD  omeprazole (PRILOSEC) 40 MG capsule Take 40 mg by mouth daily.   Yes Historical Provider, MD  ondansetron (ZOFRAN) 8 MG tablet Take 8 mg by mouth every 8 (eight) hours as needed for nausea or vomiting.   Yes Historical Provider, MD  tamsulosin (FLOMAX) 0.4 MG CAPS capsule Take 0.4 mg by mouth at bedtime. 07/09/15  Yes Historical Provider, MD    Current Medications Scheduled Meds: .  stroke: mapping our early stages of recovery book   Does not apply Once  . aspirin  300 mg Rectal Daily   Or  . aspirin  325 mg Oral Daily  . dexamethasone  4 mg Oral Q12H  . gabapentin  100 mg Oral BID  . heparin  5,000 Units Subcutaneous 3 times per day  . metoprolol tartrate  25 mg Oral BID  . pantoprazole  40 mg Oral Daily  . polyethylene glycol  17 g Oral Daily  . tamsulosin  0.4 mg Oral QHS   Continuous Infusions:  PRN Meds:.HYDROcodone-acetaminophen, senna-docusate  CBC  Recent Labs Lab 12/04/15 0818 12/04/15 0824  WBC 9.5  --   NEUTROABS 6.9  --   HGB 12.3* 14.3  HCT 39.4 42.0  MCV 87.6  --   PLT 161  --    Basic Metabolic Panel  Recent Labs Lab 12/04/15 0818 12/04/15 0824  NA 136 132*  K 6.2* 5.9*  CL 92* 96*  CO2 22  --   GLUCOSE 98 94  BUN 98* 92*  CREATININE 8.90* 8.40*  CALCIUM 9.0  --     Physical Exam  Blood pressure 137/83, pulse 73, temperature 98 F (36.7 C), temperature source Oral, resp. rate 20, SpO2 98 %. GEN: somnolent, awakens to voice ENT: twitching across L side of face EYES: eyes closed CV: RRR PULM: CTAB ABD: s/nt/nd SKIN: no rashes/lesions EXT:no LEE   A/P 1. ESRD: HD today then discontinue HD; will complete Tx only if doesn't cause suffereing / complications 2. Hyperkalemia: as above 3. HTN/Vol: stable, 3L UF  today 4. Metastatic Prostate Cancer: to transition to hospice 5. Seizure with new brain metastasis  RPearson GrippeMD 12/04/2015, 12:44 PM

## 2015-12-04 NOTE — Consult Note (Signed)
   [facility] Inpatient Consult   12/04/2015  Tim Kirby 10-18-1940 [id number] Patient is currently off the unit and was screened for re-start of [facility] Care Management.  Spoke with inpatient RNCM, Levada Dy, and patient is currently for Residential Hospice.  No current [facility] Care Management needs indentified as the patient will have full services under Hospice.  For questions, please contact: Natividad Brood, RN BSN [facility] Liaison  [phone] business mobile phone Toll free office [phone]

## 2015-12-04 NOTE — ED Notes (Signed)
Patient physically moved to room D35 from Trauma C; Neurologist at bedside

## 2015-12-04 NOTE — Progress Notes (Signed)
Per Dr. Justin Mend in Nephrology/[facility] ok to give IV contrast for CT Angio head/neck.

## 2015-12-04 NOTE — ED Notes (Signed)
Neurologist speaking with the pts spouse on the phone, pts spouse updated on pt status

## 2015-12-04 NOTE — Consult Note (Signed)
Requesting Physician: Dr.  Laneta Simmers    Reason for consultation:  Altered mental status, code stroke  HPI:                                                                                                                                         Tim Kirby is an 75 y.o. male patient who presented to the [facility] ER via EMS with altered mental status symptoms. He woke up this morning around 6 AM. At 6.30am, his wife gave him some hydrocodone and crackers, and a few minutes following that he had altered mental status was poorly responsive. EMS were called in and was transported to the ER. Patient unable to provide any history.  His wife reported drowsiness and mental status changes following the hydrocodone but denied noticing any facial droop or any focal weakness in limbs. He was noted to have generalized weakness and some slurred speech due to the drowsiness.  Review of the EMR revealed that he has metastatic prostate cancer with extensive bony metastatic disease and a recent nuclear bone scan from December 2016. Prior history of subdural hemorrhage in December 2016.    Date last known well:  12/04/15 Time last known well:  Presumed to be at his baseline when he woke up at 6 AM from available information  tPA Given: No: encephalopathic, medication related. Prior history of subdural hemorrhage in December 2016.    Past Medical History: Past Medical History  Diagnosis Date  . Hyperlipidemia   . Gout   . Secondary hyperparathyroidism ([place])   . Anemia   . Colon, diverticulosis   . Thyroid nodule   . Anginal pain ([place])     sees Dr. Lia Foyer  . Hypertension     sees Dr. Willy Eddy  . Aneurysm artery, popliteal ([place])   . Upper respiratory infection April 2016  . CHF (congestive heart failure) ([place])   . Aneurysm of right femoral artery ([place])   . ESRD (end stage renal disease) on dialysis ([place])     "TTS; [place]" (06/15/2015)  . Renal insufficiency   . Cancer [facility])     Past Surgical History    Procedure Laterality Date  . Knee surgery Left     cyst removal by Dr. Lorin Mercy  . Av fistula placement Left     left UA AVF  . Femoral-popliteal bypass graft  10/18/2012    Procedure: BYPASS GRAFT FEMORAL-POPLITEAL ARTERY;  Surgeon: Conrad Aberdeen, MD;  Location: [facility] OR;  Service: Vascular;  Laterality: Right;  Right Popliteal Aneurysm Exclusion; Ultrasound guided  . False aneurysm repair Left 12/29/2012    Procedure: REPAIR FALSE ANEURYSM;  Surgeon: Conrad Pena, MD;  Location: [place];  Service: Vascular;  Laterality: Left;  . Endovascular repair of popliteal artery aneurysm Left 12/29/2012    Procedure: ENDOVASCULAR REPAIR OF POPLITEAL AND FEMORAL ARTERY ANEURYSM;  Surgeon: Conrad Ina, MD;  [facility];  Service: Vascular;  Laterality: Left;  . Abdominal aortagram N/A 09/27/2012    Procedure: ABDOMINAL Maxcine Ham;  Surgeon: Angelia Mould, MD;  [facility];  Service: Cardiovascular;  Laterality: N/A;  . Tonsillectomy and adenoidectomy      Family History: Family History  Problem Relation Age of Onset  . Hypertension Mother   . Diabetes Mother   . Diabetes Brother     Social History:   reports that he quit smoking about 8 years ago. His smoking use included Pipe, Cigars, and Cigarettes. He quit after 50 years of use. He has never used smokeless tobacco. He reports that he drinks alcohol. He reports that he does not use illicit drugs.  Allergies:  Allergies  Allergen Reactions  . Denosumab Other (See Comments)    Severe symptomatic hypocalcemia 1 week after 120 mg SQ denosumab. Should be avoided completely.      Medications:                                                                                                                        No current facility-administered medications for this encounter.  Current outpatient prescriptions:  .  acetaminophen (TYLENOL) 325 MG tablet, Take 1 tablet (325 mg total) by mouth every 4 (four) hours as needed for mild pain  or moderate pain. (Patient not taking: Reported on 10/22/2015), Disp: 90 tablet, Rfl: 3 .  bicalutamide (CASODEX) 50 MG tablet, Take 1 tablet (50 mg total) by mouth daily. (Patient not taking: Reported on 11/20/2015), Disp: 29 tablet, Rfl: 0 .  calcium carbonate (TUMS - DOSED IN MG ELEMENTAL CALCIUM) 500 MG chewable tablet, Chew 5 tablets (1,000 mg of elemental calcium total) by mouth 3 (three) times daily with meals. (Patient not taking: Reported on 10/22/2015), Disp: 90 tablet, Rfl: 0 .  gabapentin (NEURONTIN) 100 MG capsule, Take 1 capsule (100 mg total) by mouth 2 (two) times daily., Disp: 60 capsule, Rfl: 0 .  HYDROcodone-acetaminophen (NORCO/VICODIN) 5-325 MG tablet, Take 1-2 tablets by mouth every 6 (six) hours as needed for severe pain (be aware this can make him drowsy so only take after tylenol. and limit total tylenol dose to 4000mg /day)., Disp: 30 tablet, Rfl: 0 .  HYDROcodone-acetaminophen (NORCO/VICODIN) 5-325 MG tablet, Take 1-2 tablets by mouth every 6 (six) hours as needed (breakthrough pain). (Patient not taking: Reported on 10/11/2015), Disp: 30 tablet, Rfl: 0 .  metoprolol (LOPRESSOR) 50 MG tablet, Take 1 tablet (50 mg total) by mouth 2 (two) times daily. (Patient taking differently: Take 25 mg by mouth 2 (two) times daily. 25 mg twice daily), Disp: 60 tablet, Rfl: 0 .  multivitamin (RENA-VIT) TABS tablet, Take 1 tablet by mouth daily., Disp: , Rfl:  .  Nutritional Supplements (FEEDING SUPPLEMENT, NEPRO CARB STEADY,) LIQD, Take 237 mLs by mouth 2 (two) times daily between meals. (Patient not taking: Reported on 10/11/2015), Disp: 30 Can, Rfl: 3 .  omeprazole (PRILOSEC) 40 MG capsule, Take 40 mg by  mouth daily., Disp: , Rfl:  .  polyethylene glycol (MIRALAX / GLYCOLAX) packet, Take 17 g by mouth daily., Disp: 14 each, Rfl: 0 .  tamsulosin (FLOMAX) 0.4 MG CAPS capsule, Take 0.4 mg by mouth at bedtime., Disp: , Rfl:    ROS:                                                                                                                                        History    unobtainable from patient due to mental status  Neurologic Examination:                                                                                                      Blood pressure 134/96, pulse 89, temperature 98.8 F (37.1 C), temperature source Oral, resp. rate 19, SpO2 100 %.  Evaluation of higher integrative functions including: Level of alertness:Drowsy , he aroused to verbal commands and tactile stimulus  Oriented to place and person, not to month or year  Speech:  initially was slightly slurred with drowsiness but that improved level of alertness his speech became fluent, no  definite evidence of dysarthria or aphasia noted.  Test the following cranial nerves: 2-12 grossly intact Motor examination: Normal tone,  able to demonstrate symmetric antigravity strength in all 4 extremities, with no drift  Examination of sensation :Reports symmetric  sensation to pinprick in all 4 extremities and on face Examination of deep tendon reflexes: 2+, symmetric in all extremities, normal plantars bilaterally Test coordination: no evidence of limb appendicular ataxia,  He has negative myoclonus or asterixis in bilateral upper extremities. There is also intermittent left facial myoclonic twitching noted  Gait: Deferred   Lab Results: Basic Metabolic Panel:  Recent Labs Lab 12/04/15 0824  NA 132*  K 5.9*  CL 96*  GLUCOSE 94  BUN 92*  CREATININE 8.40*    Liver Function Tests: No results for input(s): AST, ALT, ALKPHOS, BILITOT, PROT, ALBUMIN in the last 168 hours. No results for input(s): LIPASE, AMYLASE in the last 168 hours. No results for input(s): AMMONIA in the last 168 hours.  CBC:  Recent Labs Lab 12/04/15 0818 12/04/15 0824  WBC 9.5  --   NEUTROABS 6.9  --   HGB 12.3* 14.3  HCT 39.4 42.0  MCV 87.6  --   PLT 161  --     Cardiac Enzymes: No results for input(s): CKTOTAL, CKMB, CKMBINDEX,  TROPONINI in the last 168 hours.  Lipid Panel: No  results for input(s): CHOL, TRIG, HDL, CHOLHDL, VLDL, LDLCALC in the last 168 hours.  CBG: No results for input(s): GLUCAP in the last 168 hours.  Microbiology: Results for orders placed or performed during the hospital encounter of 10/02/15  Culture, blood (routine x 2)     Status: None   Collection Time: 10/02/15  1:14 PM  Result Value Ref Range Status   Specimen Description BLOOD RIGHT ANTECUBITAL  Final   Special Requests BOTTLES DRAWN AEROBIC AND ANAEROBIC 5CC  Final   Culture NO GROWTH 5 DAYS  Final   Report Status 10/07/2015 FINAL  Final  Culture, blood (routine x 2)     Status: None   Collection Time: 10/02/15  2:56 PM  Result Value Ref Range Status   Specimen Description BLOOD RIGHT ANTECUBITAL  Final   Special Requests BOTTLES DRAWN AEROBIC AND ANAEROBIC 5CC  Final   Culture NO GROWTH 5 DAYS  Final   Report Status 10/07/2015 FINAL  Final     Imaging: Ct Head Wo Contrast  12/04/2015  CLINICAL DATA:  74 year old male unresponsive after taking morning medication. Aphasia. Symptoms have since resolved. Code stroke. EXAM: CT HEAD WITHOUT CONTRAST TECHNIQUE: Contiguous axial images were obtained from the base of the skull through the vertex without intravenous contrast. COMPARISON:  Head CT 10/02/2015. FINDINGS: Mild cerebral and cerebellar atrophy. Patchy and confluent areas of decreased attenuation are noted throughout the deep and periventricular white matter of the cerebral hemispheres bilaterally, compatible with chronic microvascular ischemic disease. Extensive atherosclerotic calcifications throughout the visualized intracranial vasculature. No acute intracranial abnormalities. Specifically, no evidence of acute intracranial hemorrhage, no definite findings of acute/subacute cerebral ischemia, no mass, mass effect, hydrocephalus or abnormal intra or extra-axial fluid collections. Visualized paranasal sinuses and mastoids are  well pneumatized. No acute displaced skull fractures are identified. IMPRESSION: 1. No acute intracranial abnormalities. 2. Mild cerebral and cerebellar atrophy with extensive chronic microvascular ischemic changes in cerebral white matter. 3. Severe atherosclerosis. These results were called by telephone at the time of interpretation on 12/04/2015 at 8:45 am to Dr. Leo Grosser, who verbally acknowledged these results. Electronically Signed   By: Vinnie Langton M.D.   On: 12/04/2015 08:46    Assessment and plan:   Tim Kirby is an 75 y.o. male patient who presentedTo the emergency room with altered mental status this morning few minutes after taking hydrocodone tablet. He has end-stage renal failure, hemodialysis dependent. He also has extensive bony metastatic disease from prostate cancer noted on a recent nuclear bone scan, and subdural hemorrhage in December 2016.  His neurological examination is suggestive of encephalopathy based on asterixis and reduced level of alertness, both of which have improved after Narcan was given in the ER. Suspect that this episode of mental status changes this morning was triggered due to the hydrocodone he took this morning, with a combination of other metabolic medical problems. No definite clinical indication to suggest an acute stroke. Hence stroke code was canceled.  CT of the head showed extensive vascular calcifications in major intracranial arteries. CTA head and neck was performed, which  Showed no proximal occlusion. Advanced intracranial atherosclerosis with dilated M1 segments and moderate left distal M1 stenosis. Left more than right M2 high-grade stenoses. Critical stenosis of the non dominant distal left V4 segment. Moderate proximal basilar stenosis. Critical left P1 stenosis but large posterior communicating artery. 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 Prostate cancer with diffuse skeletal metastases.   On a personal review of the CT images, it certainly appears to be an extradural metastatic lesion intracranially, please note the image below.   MRI of the brain was also performed which did not show any evidence of acute stroke. The same right posterior frontal convex to extradural mass lesion suspicious for metastatic lesion is again seen on the MRI study. Contrast was not given due to end-stage renal failure.   I had an excessive discussion with the patient's wife with regard to his current neurological assessment, CT and MRI brain findings and likely metastatic lesion noted intracranially as well as extensive skeletal metastasis to the patient's wife already is aware of. We discussed about his ongoing oncology treatment, medical comorbidity conditions, and quality of life.  Based on discussion, she will like to discuss further the palliative care and is interested in comfort measures at this time.   Discussed with the primary admitting hospitalist, Dr. Marily Memos. He further discussed about comfort measures with patient and his wife, and expect a final decision in the next 24 hours. At this time, the CODE STATUS has  been changed to DO NOT RESUSCITATE.   No further neurodiagnostic workup at this time.  We'll sign off. Please call for any further questions

## 2015-12-04 NOTE — Procedures (Signed)
I was present at this dialysis session. I have reviewed the session itself and made appropriate changes.   Pearson Grippe  MD 12/04/2015, 3:33 PM

## 2015-12-04 NOTE — Progress Notes (Signed)
Report given to HD nurse  

## 2015-12-05 LAB — URINE MICROSCOPIC-ADD ON: RBC / HPF: NONE SEEN RBC/hpf (ref 0–5)

## 2015-12-05 LAB — URINALYSIS, ROUTINE W REFLEX MICROSCOPIC
Bilirubin Urine: NEGATIVE
Glucose, UA: NEGATIVE mg/dL
HGB URINE DIPSTICK: NEGATIVE
KETONES UR: NEGATIVE mg/dL
Nitrite: NEGATIVE
PROTEIN: 100 mg/dL — AB
Specific Gravity, Urine: 1.013 (ref 1.005–1.030)
pH: 8 (ref 5.0–8.0)

## 2015-12-05 LAB — RAPID URINE DRUG SCREEN, HOSP PERFORMED
Amphetamines: NOT DETECTED
BARBITURATES: NOT DETECTED
Benzodiazepines: NOT DETECTED
Cocaine: NOT DETECTED
Opiates: POSITIVE — AB
TETRAHYDROCANNABINOL: NOT DETECTED

## 2015-12-05 LAB — HEMOGLOBIN A1C
Hgb A1c MFr Bld: 5.3 % (ref 4.8–5.6)
Mean Plasma Glucose: 105 mg/dL

## 2015-12-05 MED ORDER — MORPHINE SULFATE 15 MG PO TABS: 15.0000 mg | ORAL_TABLET | ORAL | Status: AC | PRN

## 2015-12-05 NOTE — Progress Notes (Signed)
RN report called to Ysidro Evert RN at hospice house [phone]. No questions or concerns voiced when asked. Pt is scheduled to leave via ambulance at 2 PM. Family members are informed of this. Dorita Fray 12/05/2015 12:41 PM

## 2015-12-05 NOTE — Progress Notes (Signed)
PT Cancellation Note  Patient Details Name: Tim Kirby MRN: [id number] DOB: Apr 05, 1941   Cancelled Treatment:    Reason Eval/Treat Not Completed: PT screened, no needs identified, will sign off; spoke with patient and family.  They relate he is scheduled to go to residential Hospice facility.  Feel it is too painful for him to get up at this time and politely refusing PT.  Please order new consult if condition changes.   Reginia Naas 12/05/2015, 1:40 PM Magda Kiel, [place] 12/05/2015

## 2015-12-05 NOTE — Care Management Note (Signed)
Case Management Note  Patient Details  Name: Tim Kirby MRN: [id number] Date of Birth: July 15, 1941  Subjective/Objective:    Admitted with stroke like symptom vs seizures.                Action/Plan: Plan is to d/c to residential hospice today. No needs identified per CM.  Expected Discharge Date:       12/05/15           Expected Discharge Plan:  [place] [facility])  In-House Referral:  Clinical Social Work  Discharge planning Services  CM Consult  Post Acute Care Choice:    Choice offered to:     DME Arranged:    DME Agency:     HH Arranged:    [place] Agency:     Status of Service:  Completed, signed off  Medicare Important Message Given:    Date Medicare IM Given:    Medicare IM give by:    Date Additional Medicare IM Given:    Additional Medicare Important Message give by:     If discussed at [place] of Stay Meetings, dates discussed:    Additional Comments:  Sharin Mons, [place] [phone] 12/05/2015, 12:48 PM

## 2015-12-05 NOTE — Progress Notes (Signed)
IV catheter removed with catheter tip intact. Dressing in place. No signs and symptoms of bleeding noted. IV removed at request from Barbaraann Rondo at hospice house. Dorita Fray 12/05/2015 2:03 PM

## 2015-12-05 NOTE — Progress Notes (Signed)
Patient will DC to: [facility] Anticipated DC date: 12/05/15 Family notified: Wife Transport by: [facility]  CSW signing off.  Cedric Fishman, [place] Social Worker [phone]

## 2015-12-05 NOTE — Progress Notes (Signed)
UR COMPLETED  

## 2015-12-05 NOTE — Discharge Summary (Signed)
Physician Discharge Summary  Tim Kirby [id number] DOB: [birth year] DOA: 12/04/2015  PCP: [name], MD  Admit date: 12/04/2015 Discharge date: 12/05/2015  Time spent: 45minutes  Recommendations for Outpatient Follow-up:  1. [facility].    Discharge Diagnoses:  Principal Problem:   Stroke-like symptoms Active Problems:   Essential hypertension   End stage renal disease (HCC)   AF (paroxysmal atrial fibrillation) (HCC)   CAD in native artery   Chronic diastolic CHF (congestive heart failure) (HCC)   Acute encephalopathy   Encephalopathy acute   Hyperkalemia   Discharge Condition: poor  Diet recommendation: regular  Filed Weights   12/04/15 1202 12/04/15 1521 12/04/15 1833  Weight: 77.111 kg (170 lb) 96.1 kg (211 lb 13.8 oz) 95.2 kg (209 lb 14.1 oz)    History of present illness:  75 year old with past medical history of hypertension paroxysmal atrial fibrillation and end-stage renal disease who dialyzes Tuesday Thursdays and Saturdays, metastatic prostate cancer to the bone the comes into the emergency room with encephalopathy and new right-sided weakness.  Hospital Course:  Acute encephalopathy: End-stage renal disease Essential hypertension Metastatic prostate cancer Paroxysmal atrial fibrillation Likely multifactorial question of post ictal or uremic, there were no signs of infection on admission. A CT scan of the brain was done that showed no acute abnormality EEG done by neurology show no focal or signs of seizures. His symptoms are to resolve after HD. Palliative care bed with family and due to his poor prognosis and ongoing complications over the last several months he decided to move towards comfort care. The family only wants him to be treated symptomatically. All of his medications were DC'd except for morphine.   Procedures:  CT head  MRI brain CXR  Consultations:  Neurology  Nephrology  Discharge Exam: Filed  Vitals:   12/05/15 0559 12/05/15 0901  BP: 133/68 133/82  Pulse: 80 79  Temp: 97.5 F (36.4 C)   Resp: 18     General: A&O x3 Cardiovascular: RRR Respiratory: good air movement CTA B/L  Discharge Instructions   Discharge Instructions    Diet - low sodium heart healthy    Complete by:  As directed      Increase activity slowly    Complete by:  As directed           Current Discharge Medication List    START taking these medications   Details  morphine (MSIR) 15 MG tablet Take 1 tablet (15 mg total) by mouth every 4 (four) hours as needed for severe pain. Qty: 30 tablet, Refills: 0      CONTINUE these medications which have NOT CHANGED   Details  gabapentin (NEURONTIN) 100 MG capsule Take 1 capsule (100 mg total) by mouth 2 (two) times daily. Qty: 60 capsule, Refills: 0    multivitamin (RENA-VIT) TABS tablet Take 1 tablet by mouth daily.    omeprazole (PRILOSEC) 40 MG capsule Take 40 mg by mouth daily.    ondansetron (ZOFRAN) 8 MG tablet Take 8 mg by mouth every 8 (eight) hours as needed for nausea or vomiting.      STOP taking these medications     dexamethasone (DECADRON) 4 MG tablet      HYDROcodone-acetaminophen (NORCO/VICODIN) 5-325 MG tablet      metoprolol (LOPRESSOR) 50 MG tablet      tamsulosin (FLOMAX) 0.4 MG CAPS capsule      polyethylene glycol (MIRALAX / GLYCOLAX) packet        Allergies  Allergen  Reactions  . Denosumab Other (See Comments)    Severe symptomatic hypocalcemia 1 week after 120 mg SQ denosumab. Should be avoided completely.    Follow-up Information    Follow up with [name], MD.   Specialty:  Internal Medicine   Contact information:   [address] [place] [id number] [phone]        The results of significant diagnostics from this hospitalization (including imaging, microbiology, ancillary and laboratory) are listed below for reference.    Significant Diagnostic Studies: Ct Angio  Head W/cm &/or Wo Cm  12/04/2015  CLINICAL DATA:  Right-sided weakness and aphasia. Neurology reports global symptoms after narcotic. EXAM: CT ANGIOGRAPHY HEAD AND NECK TECHNIQUE: Multidetector CT imaging of the head and neck was performed using the standard protocol during bolus administration of intravenous contrast. Multiplanar CT image reconstructions and MIPs were obtained to evaluate the vascular anatomy. Carotid stenosis measurements (when applicable) are obtained utilizing NASCET criteria, using the distal internal carotid diameter as the denominator. CONTRAST:  28mL OMNIPAQUE IOHEXOL 350 MG/ML SOLN COMPARISON:  Head CT from earlier today.  Brain MRI 10/01/2016. FINDINGS: Poorly opacified vessels, likely from poor cardiac output and small bolus. CTA NECK Aortic arch: Arteriomegaly without evidence of dissection. Diffuse atherosclerotic calcification. Two vessel branching. Right carotid system: Bulky atherosclerosis at the bifurcation without flow limiting stenosis. No evidence of dissection. Left carotid system: Bulky atherosclerosis at the bifurcation without flow limiting stenosis. No evidence of dissection. Vertebral arteries:Right dominant. No flow limiting subclavian or brachiocephalic stenosis. Plaque at the origins without suspected flow limiting stenosis. Skeleton: Diffuse skeletal metastases from prostate cancer. There may also be superimposed renal osteodystrophy with multiple endplate erosions. There is extraosseous tumor growth subpleurally from the anterior left and right second ribs. Other neck: Layering pleural effusions. Enlarged main pulmonary artery and cardiomegaly. Atherosclerosis. CTA HEAD Anterior circulation: Diffuse atherosclerotic calcification on the ectatic supraclinoid ICA and proximal M1 segments. Outer wall diameter measures 9 mm at most on the right - some of this diameter is related to calcified wall thickening. There is to moderate narrowing in the left mid M1 segment due to  heavily calcified plaque. There are at least 2 high-grade left proximal M2 segment stenoses. There is a short segment high-grade right proximal into stenosis just beyond the bifurcation. Aplastic left A1 segment with anterior communicating artery. Diffuse atherosclerotic irregularity of the proximal ACA circulation. Posterior circulation: Right dominant. Dominant right AICA and left PICA. Irregular plaque at the vertebrobasilar junction with high-grade left distal V4 segment and vertebrobasilar junction stenosis. Severe stenosis of the left P1 segment, but large posterior communicating artery which serves the left PCA. Venous sinuses: Patent Delayed phase: High-density mass measuring 1 cm in thickness along the posterior right frontal convexity measuring 33 mm in length. This could reflect a hematoma or mass lesion; mass lesion favored given nodular appearance in this area on 2016 brain MRI, and the extensive sclerotic metastatic disease. These results were called by telephone at the time of interpretation on 12/04/2015 at 10:46 am to Dr. Silverio Decamp, who verbally acknowledged these results. IMPRESSION: 1. No proximal occlusion. 2. Advanced intracranial atherosclerosis with dilated M1 segments and moderate left distal M1 stenosis. Left more than right M2 high-grade stenoses. 3. Critical stenosis of the non dominant distal left V4 segment. Moderate proximal basilar stenosis. Critical left P1 stenosis but large posterior communicating artery. 4. Posterior right frontal extra-axial abnormality, favor dural metastasis over subdural hematoma given prior brain MRI appearance and extraosseous subpleural  tumor from bilateral rib metastases. 5. Prostate cancer with diffuse skeletal metastases. 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 Electronically Signed   By: Monte Fantasia M.D.   On: 12/04/2015 11:10   Dg Chest 2 View  12/04/2015  CLINICAL DATA:  Stroke. History of prostate cancer in atrial for ablation. EXAM: CHEST  2  VIEW COMPARISON:  10/02/2015 FINDINGS: Moderate cardiac enlargement. Aortic atherosclerosis noted. Chronic appearing coarsened interstitial markings are noted bilaterally. No pleural effusion edema or airspace consolidation. Diffuse sclerotic skeletal metastasis identified. IMPRESSION: 1. Cardiac enlargement and aortic atherosclerosis 2. Widespread osseous metastasis. Electronically Signed   By: Kerby Moors M.D.   On: 12/04/2015 11:57   Ct Head Wo Contrast  12/04/2015  CLINICAL DATA:  75 year old male unresponsive after taking morning medication. Aphasia. Symptoms have since resolved. Code stroke. EXAM: CT HEAD WITHOUT CONTRAST TECHNIQUE: Contiguous axial images were obtained from the base of the skull through the vertex without intravenous contrast. COMPARISON:  Head CT 10/02/2015. FINDINGS: Mild cerebral and cerebellar atrophy. Patchy and confluent areas of decreased attenuation are noted throughout the deep and periventricular white matter of the cerebral hemispheres bilaterally, compatible with chronic microvascular ischemic disease. Extensive atherosclerotic calcifications throughout the visualized intracranial vasculature. No acute intracranial abnormalities. Specifically, no evidence of acute intracranial hemorrhage, no definite findings of acute/subacute cerebral ischemia, no mass, mass effect, hydrocephalus or abnormal intra or extra-axial fluid collections. Visualized paranasal sinuses and mastoids are well pneumatized. No acute displaced skull fractures are identified. IMPRESSION: 1. No acute intracranial abnormalities. 2. Mild cerebral and cerebellar atrophy with extensive chronic microvascular ischemic changes in cerebral white matter. 3. Severe atherosclerosis. These results were called by telephone at the time of interpretation on 12/04/2015 at 8:45 am to Dr. Leo Grosser, who verbally acknowledged these results. Electronically Signed   By: Vinnie Langton M.D.   On: 12/04/2015 08:46   Ct  Angio Neck W/cm &/or Wo/cm  12/04/2015  CLINICAL DATA:  Right-sided weakness and aphasia. Neurology reports global symptoms after narcotic. EXAM: CT ANGIOGRAPHY HEAD AND NECK TECHNIQUE: Multidetector CT imaging of the head and neck was performed using the standard protocol during bolus administration of intravenous contrast. Multiplanar CT image reconstructions and MIPs were obtained to evaluate the vascular anatomy. Carotid stenosis measurements (when applicable) are obtained utilizing NASCET criteria, using the distal internal carotid diameter as the denominator. CONTRAST:  74mL OMNIPAQUE IOHEXOL 350 MG/ML SOLN COMPARISON:  Head CT from earlier today.  Brain MRI 10/01/2016. FINDINGS: Poorly opacified vessels, likely from poor cardiac output and small bolus. CTA NECK Aortic arch: Arteriomegaly without evidence of dissection. Diffuse atherosclerotic calcification. Two vessel branching. Right carotid system: Bulky atherosclerosis at the bifurcation without flow limiting stenosis. No evidence of dissection. Left carotid system: Bulky atherosclerosis at the bifurcation without flow limiting stenosis. No evidence of dissection. Vertebral arteries:Right dominant. No flow limiting subclavian or brachiocephalic stenosis. Plaque at the origins without suspected flow limiting stenosis. Skeleton: Diffuse skeletal metastases from prostate cancer. There may also be superimposed renal osteodystrophy with multiple endplate erosions. There is extraosseous tumor growth subpleurally from the anterior left and right second ribs. Other neck: Layering pleural effusions. Enlarged main pulmonary artery and cardiomegaly. Atherosclerosis. CTA HEAD Anterior circulation: Diffuse atherosclerotic calcification on the ectatic supraclinoid ICA and proximal M1 segments. Outer wall diameter measures 9 mm at most on the right - some of this diameter is related to calcified wall thickening. There is to moderate narrowing in the left mid M1 segment  due to heavily calcified plaque. There are at least  2 high-grade left proximal M2 segment stenoses. There is a short segment high-grade right proximal into stenosis just beyond the bifurcation. Aplastic left A1 segment with anterior communicating artery. Diffuse atherosclerotic irregularity of the proximal ACA circulation. Posterior circulation: Right dominant. Dominant right AICA and left PICA. Irregular plaque at the vertebrobasilar junction with high-grade left distal V4 segment and vertebrobasilar junction stenosis. Severe stenosis of the left P1 segment, but large posterior communicating artery which serves the left PCA. Venous sinuses: Patent Delayed phase: High-density mass measuring 1 cm in thickness along the posterior right frontal convexity measuring 33 mm in length. This could reflect a hematoma or mass lesion; mass lesion favored given nodular appearance in this area on 2016 brain MRI, and the extensive sclerotic metastatic disease. These results were called by telephone at the time of interpretation on 12/04/2015 at 10:46 am to Dr. Silverio Decamp, who verbally acknowledged these results. IMPRESSION: 1. No proximal occlusion. 2. Advanced intracranial atherosclerosis with dilated M1 segments and moderate left distal M1 stenosis. Left more than right M2 high-grade stenoses. 3. Critical stenosis of the non dominant distal left V4 segment. Moderate proximal basilar stenosis. Critical left P1 stenosis but large posterior communicating artery. 4. 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 5. Prostate cancer with diffuse skeletal metastases. 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 Electronically Signed   By: Monte Fantasia M.D.   On: 12/04/2015 11:10   Mr Brain Wo Contrast  12/04/2015  CLINICAL DATA:  Stroke-like symptoms EXAM: MRI HEAD WITHOUT CONTRAST  TECHNIQUE: Multiplanar, multiecho pulse sequences of the brain and surrounding structures were obtained without intravenous contrast. COMPARISON:  CT based imaging from earlier today FINDINGS: Motion degraded study, best obtainable due to patient condition. Calvarium and upper cervical spine: Diffusely hypo intense marrow attributed to known prostate cancer metastases and renal osteodystrophy. Orbits: Negative. Sinuses and Mastoids: Bilateral mastoid fluid. Brain: No acute infarct. The plaque-like dural based abnormality over the right posterior frontal convexity as described on previous head CT is re- demonstrated, measuring up to 14 mm in thickness and 42 mm in diameter. There is additional separate nodular areas around the high right frontal convexity on coronal acquisition. This material is hypo intense on T2 weighted imaging. Without contrast, cannot distinguish between the differential considerations of hematoma or dural based mass. As noted on previous head CT, dural based mass is favored given lobulated morphology, rib findings on prior neck CTA, and growing nodular appearance on brain MRI from 2016. The right frontal subdural hematoma noted at that time has nearly resolved, with mild dural thickening. No hydrocephalus. Ectatic distal ICA and proximal M1 segments as described on CTA. IMPRESSION: 1. No acute infarct. 2. Right posterior frontal extra-axial abnormality which is larger than in 2016. Dural-based metastasis is favored over hematoma as reasoned above. Abnormality is indeterminate without intravenous contrast in this patient with end-stage renal disease and surveillance head CT or head CT with and without contrast is recommended. 3. Right frontal dural thickening is smooth, and likely sequela of subdural hematoma noted December 2016. 4. 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 Electronically Signed   By: Monte Fantasia M.D.   On: 12/04/2015  11:49    Microbiology: No results found for this or any previous visit (from the past 240 hour(s)).   Labs: Basic Metabolic Panel:  Recent Labs Lab 12/04/15 0815 12/04/15 0818 12/04/15 0824  NA  --  136 132*  K  --  6.2* 5.9*  CL  --  92* 96*  CO2  --  22  --   GLUCOSE  --  98 94  BUN  --  98* 92*  CREATININE  --  8.90* 8.40*  CALCIUM  --  9.0  --   MG 2.4  --   --    Liver Function Tests:  Recent Labs Lab 12/04/15 0818  AST 28  ALT 13*  ALKPHOS 2276*  BILITOT 0.7  PROT 6.1*  ALBUMIN 3.4*   No results for input(s): LIPASE, AMYLASE in the last 168 hours. No results for input(s): AMMONIA in the last 168 hours. CBC:  Recent Labs Lab 12/04/15 0818 12/04/15 0824  WBC 9.5  --   NEUTROABS 6.9  --   HGB 12.3* 14.3  HCT 39.4 42.0  MCV 87.6  --   PLT 161  --    Cardiac Enzymes: No results for input(s): CKTOTAL, CKMB, CKMBINDEX, TROPONINI in the last 168 hours. BNP: BNP (last 3 results)  Recent Labs  04/26/15 2046 10/02/15 1550  BNP 1518.4* 3380.0*    ProBNP (last 3 results) No results for input(s): PROBNP in the last 8760 hours.  CBG: No results for input(s): GLUCAP in the last 168 hours.     Signed:  Charlynne Cousins MD.  [facility] 12/05/2015, 12:08 PM

## 2015-12-06 NOTE — Telephone Encounter (Signed)
Can discontinue follow-upas patient has elected to continue with hospice and will be stopping hemodialysis

## 2016-01-05 DEATH — deceased

## 2016-05-19 IMAGING — CR DG CHEST 2V
2 series · 2 of 2 positions shown · non-contrast
Comparison: Radiograph 04/26/2015.  Chest CT 04/23/2015

CLINICAL DATA: Cough and chest pain.

EXAM:
CHEST  2 VIEW

[chest pa]
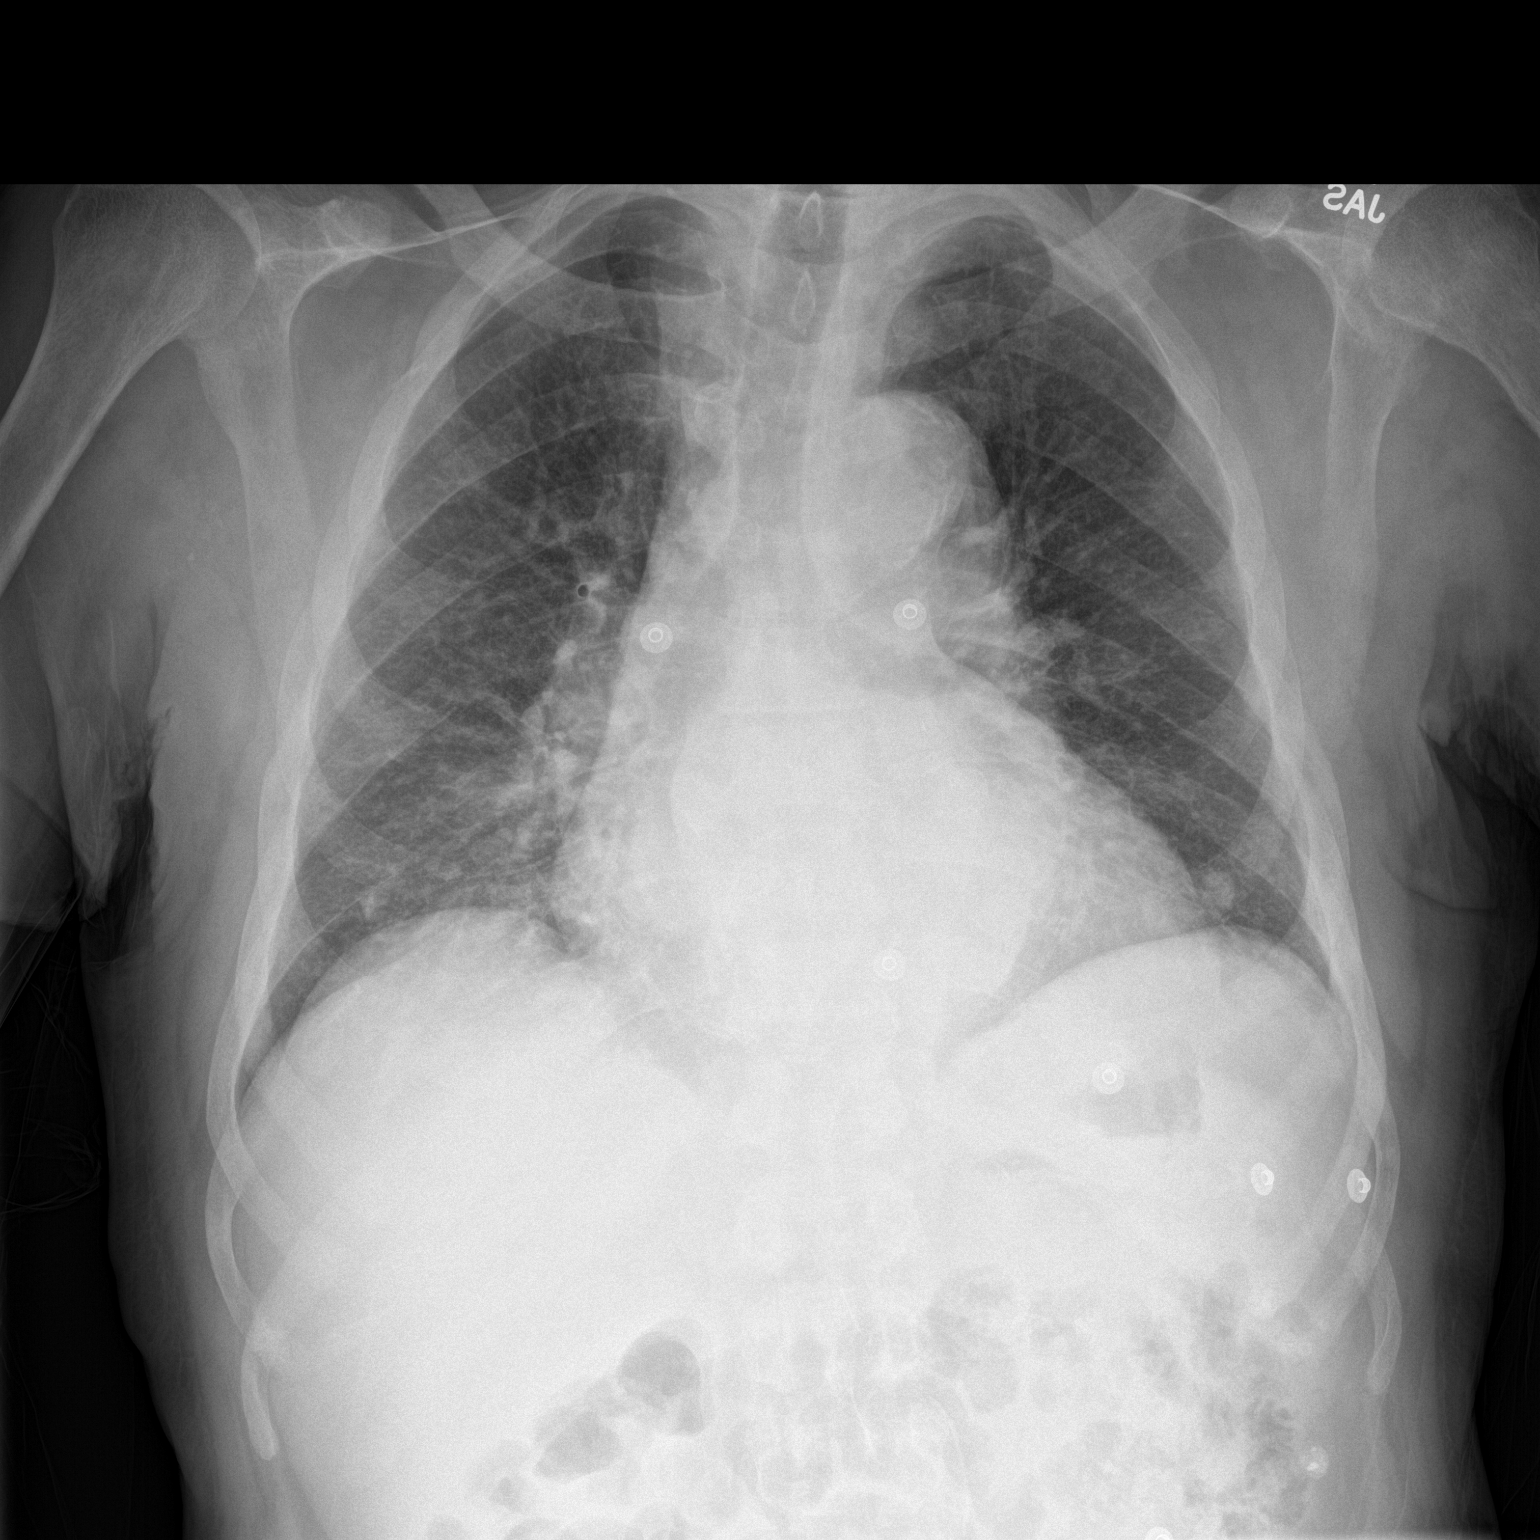

[chest lat]
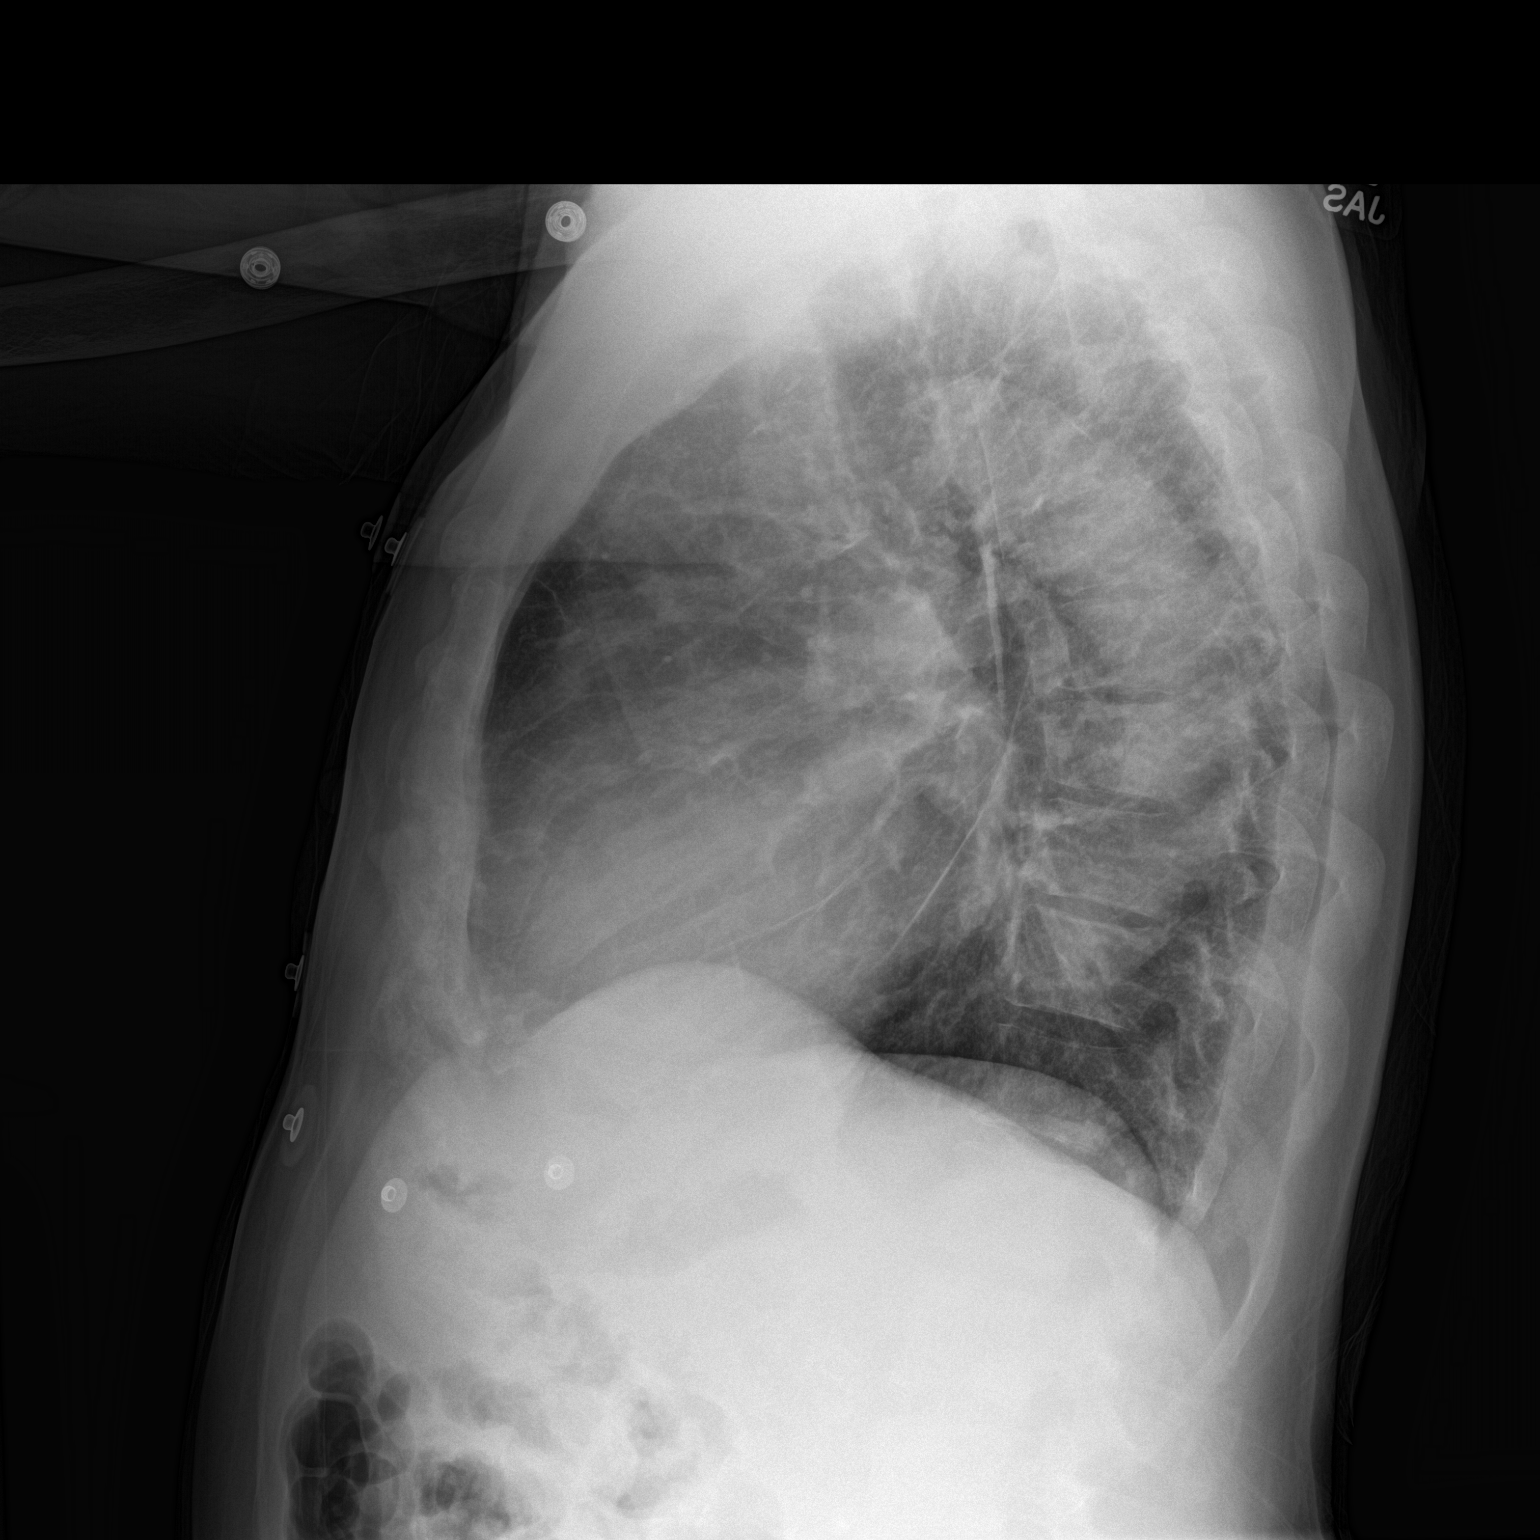

[2 of 2 positions shown; findings below may reference images not displayed]

FINDINGS: Cardiomegaly and tortuous thoracic aorta, unchanged. Diminished
pleural effusions from prior exam. Persistent vascular congestion
without recurrent pulmonary edema. Mild persistent right basilar
atelectasis. No pneumothorax. The sclerotic lesions in the osseous
structures on prior CT are not well seen radiographically.
IMPRESSION: 1. Diminished pleural effusions. Mild residual atelectasis at the
right lung base.
2. Chronic cardiomegaly, unchanged. Chronic vascular congestion
without a recurrent pulmonary edema.

## 2016-08-07 IMAGING — CR DG CHEST 2V
2 series · 2 of 2 positions shown · non-contrast
Comparison: July 10, 2015.

CLINICAL DATA: Generalized weakness.

EXAM:
CHEST  2 VIEW

[chest pa]
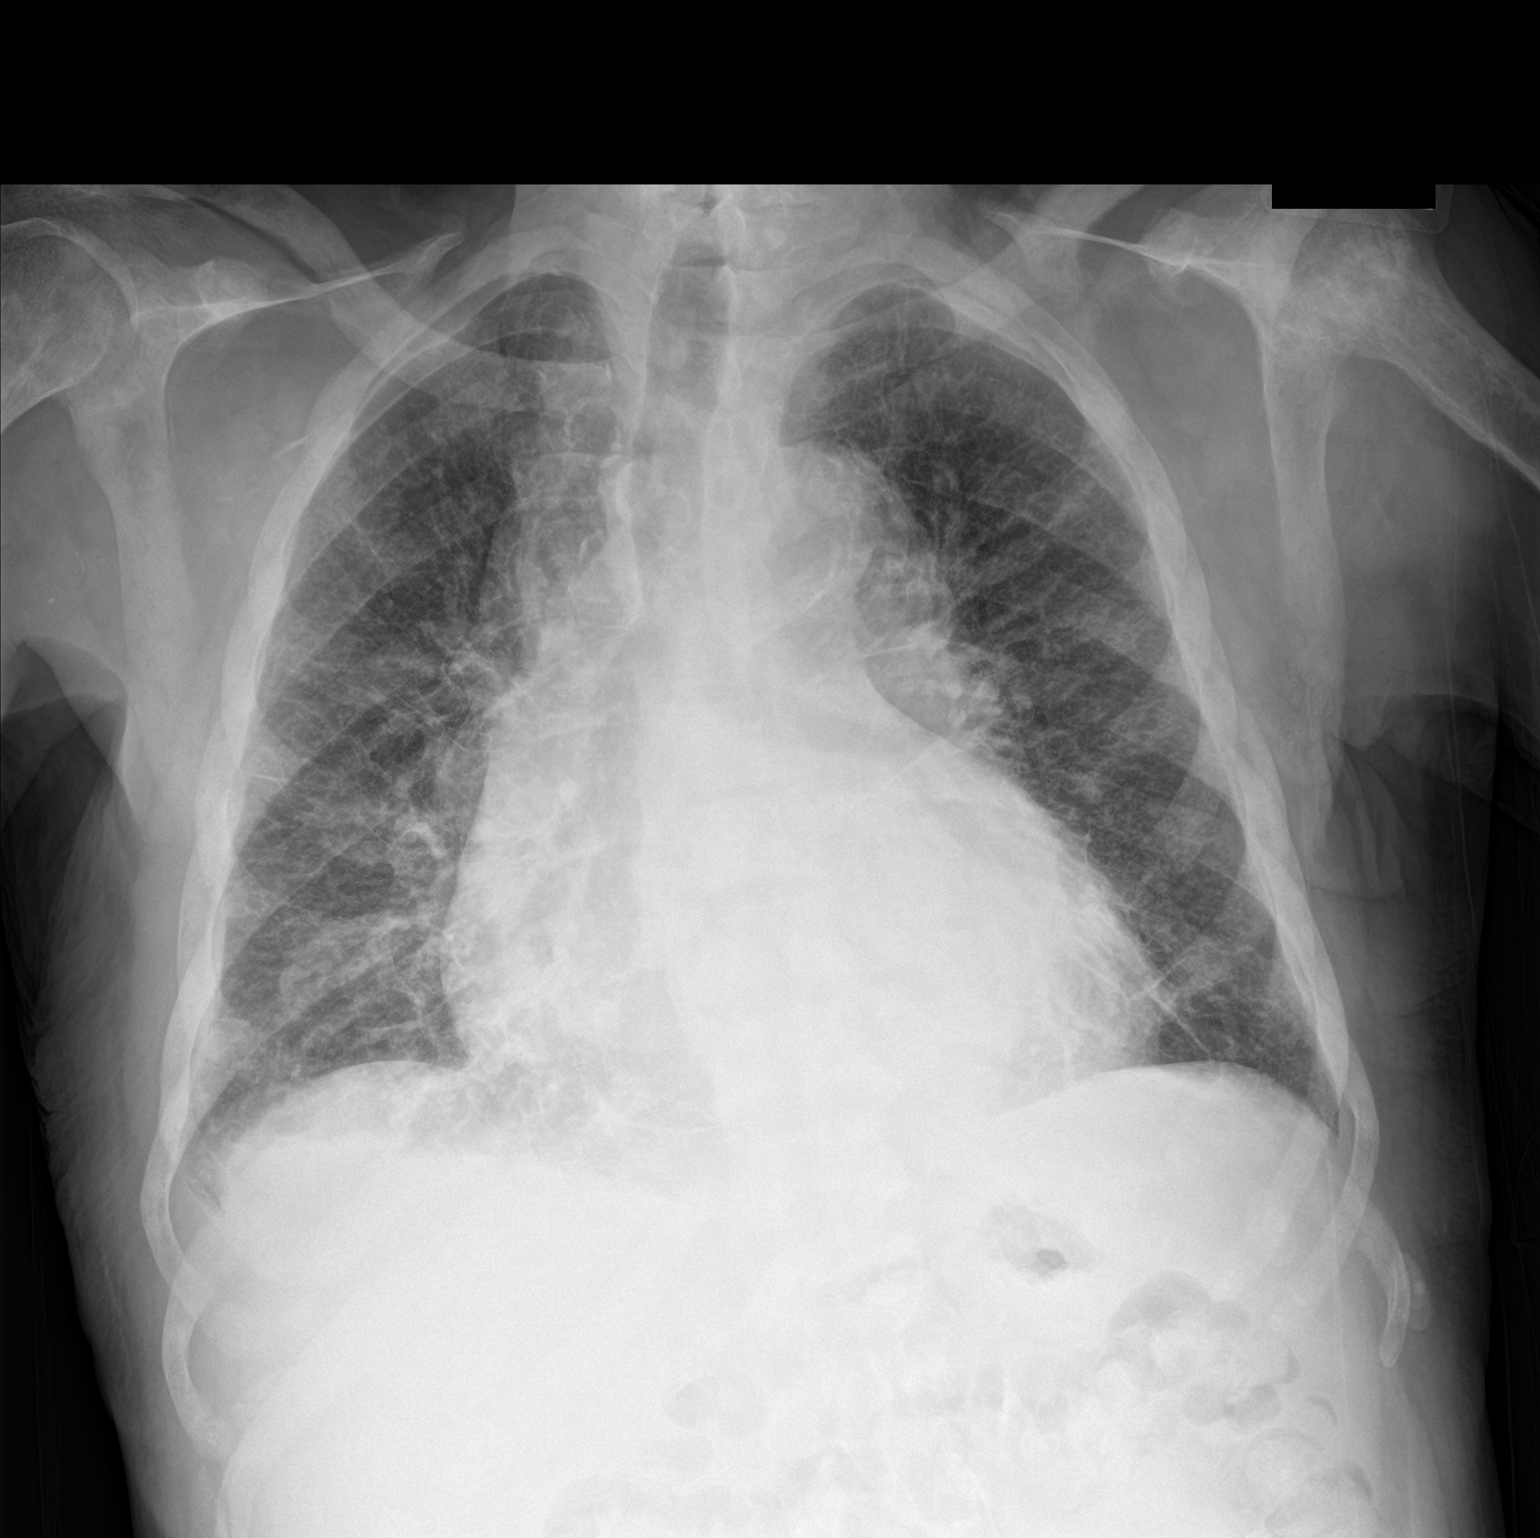

[chest lat]
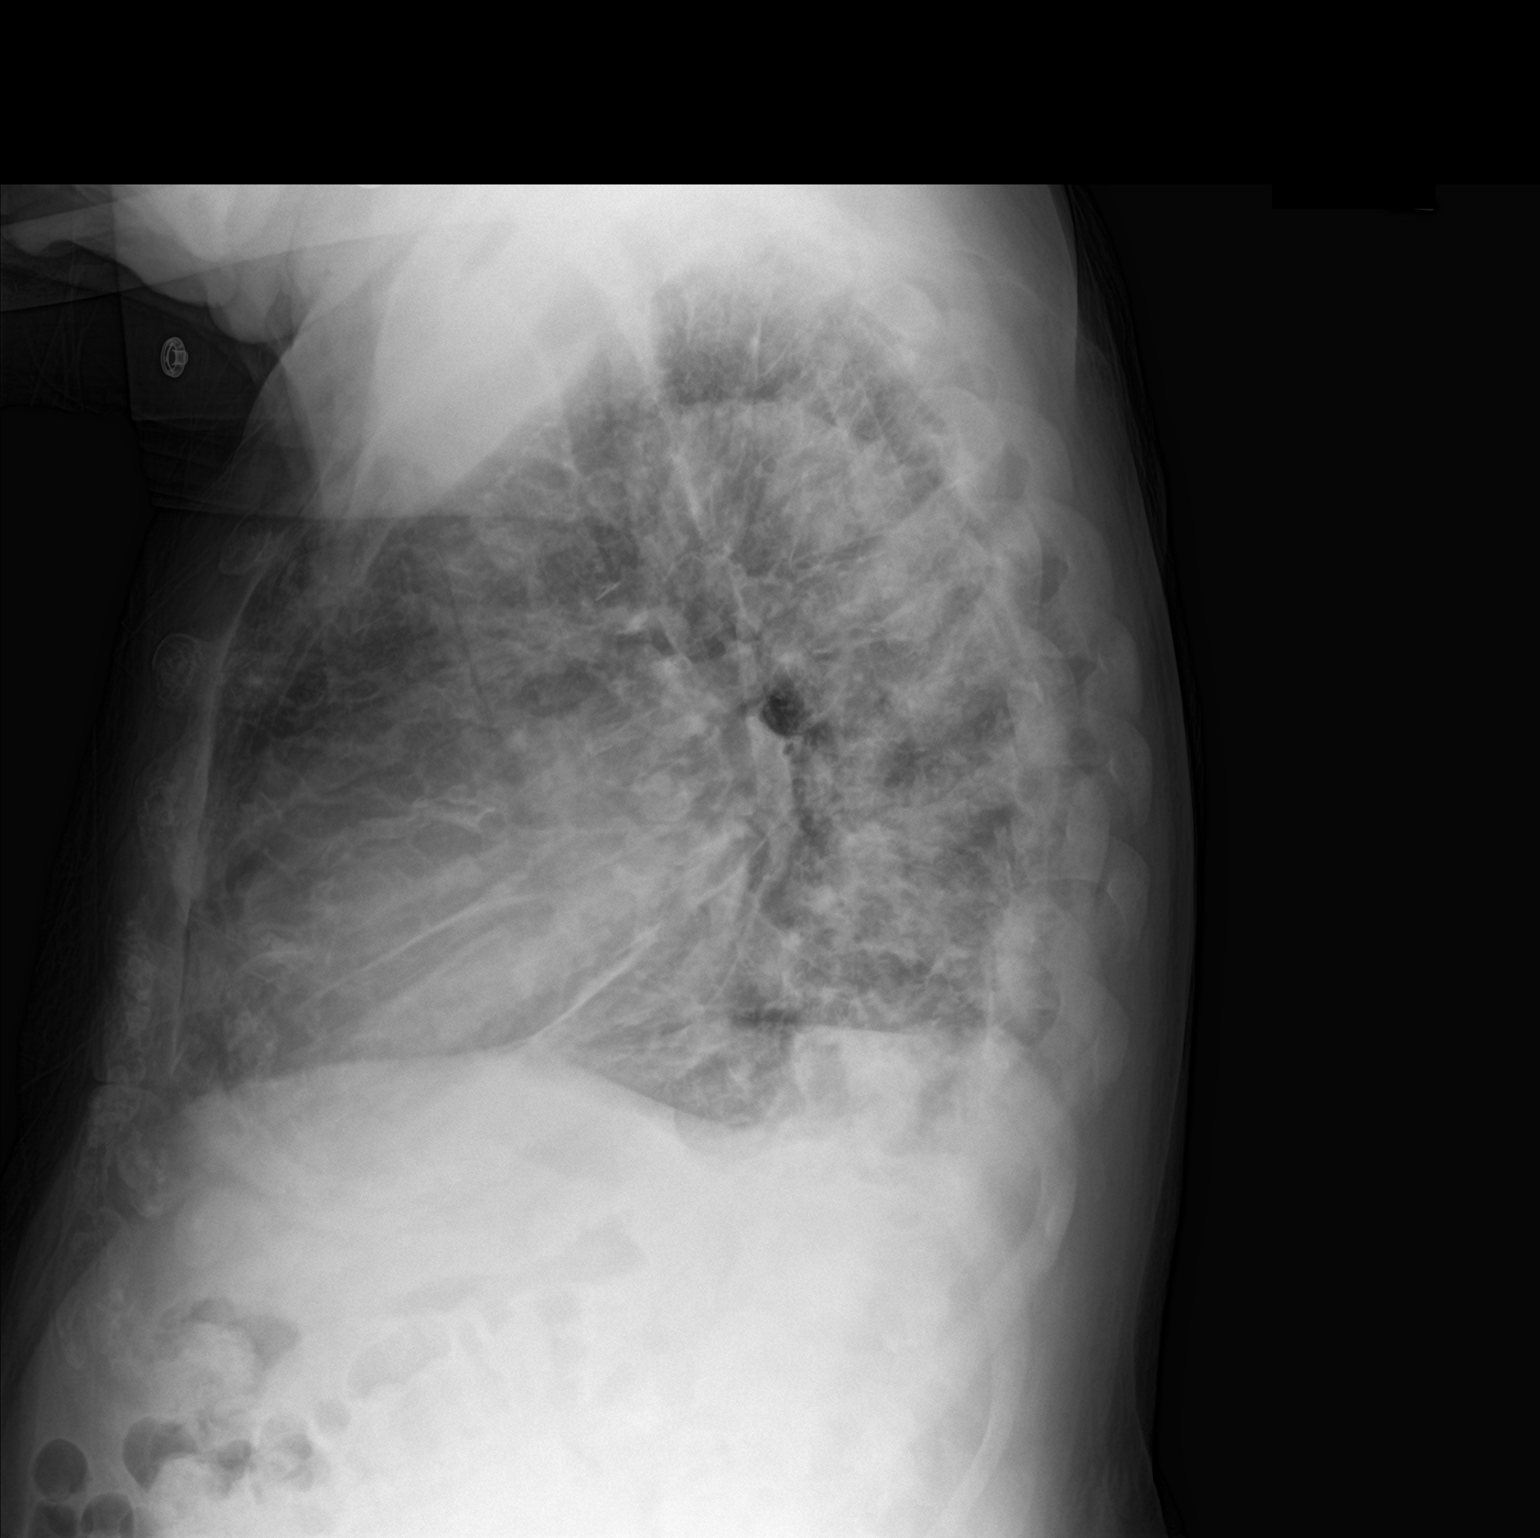

[2 of 2 positions shown; findings below may reference images not displayed]

FINDINGS: Stable cardiomegaly. Increased diffuse interstitial densities are
noted in both lungs consistent with pulmonary edema. No pneumothorax
or pleural effusion is noted. Sclerotic densities are again noted
throughout the skeleton consistent with osseous metastases.
IMPRESSION: Diffuse osseous metastases are again noted. Cardiomegaly is noted
with increased bilateral pulmonary edema compared to prior exam.

## 2016-10-16 IMAGING — CR DG CHEST 2V
2 series · 2 of 2 positions shown · non-contrast
Comparison: Prior chest x-ray 07/24/2015

CLINICAL DATA: 74-year-old male with lethargy, disorientation and
shortness of breath

EXAM:
CHEST  2 VIEW

[chest pa]
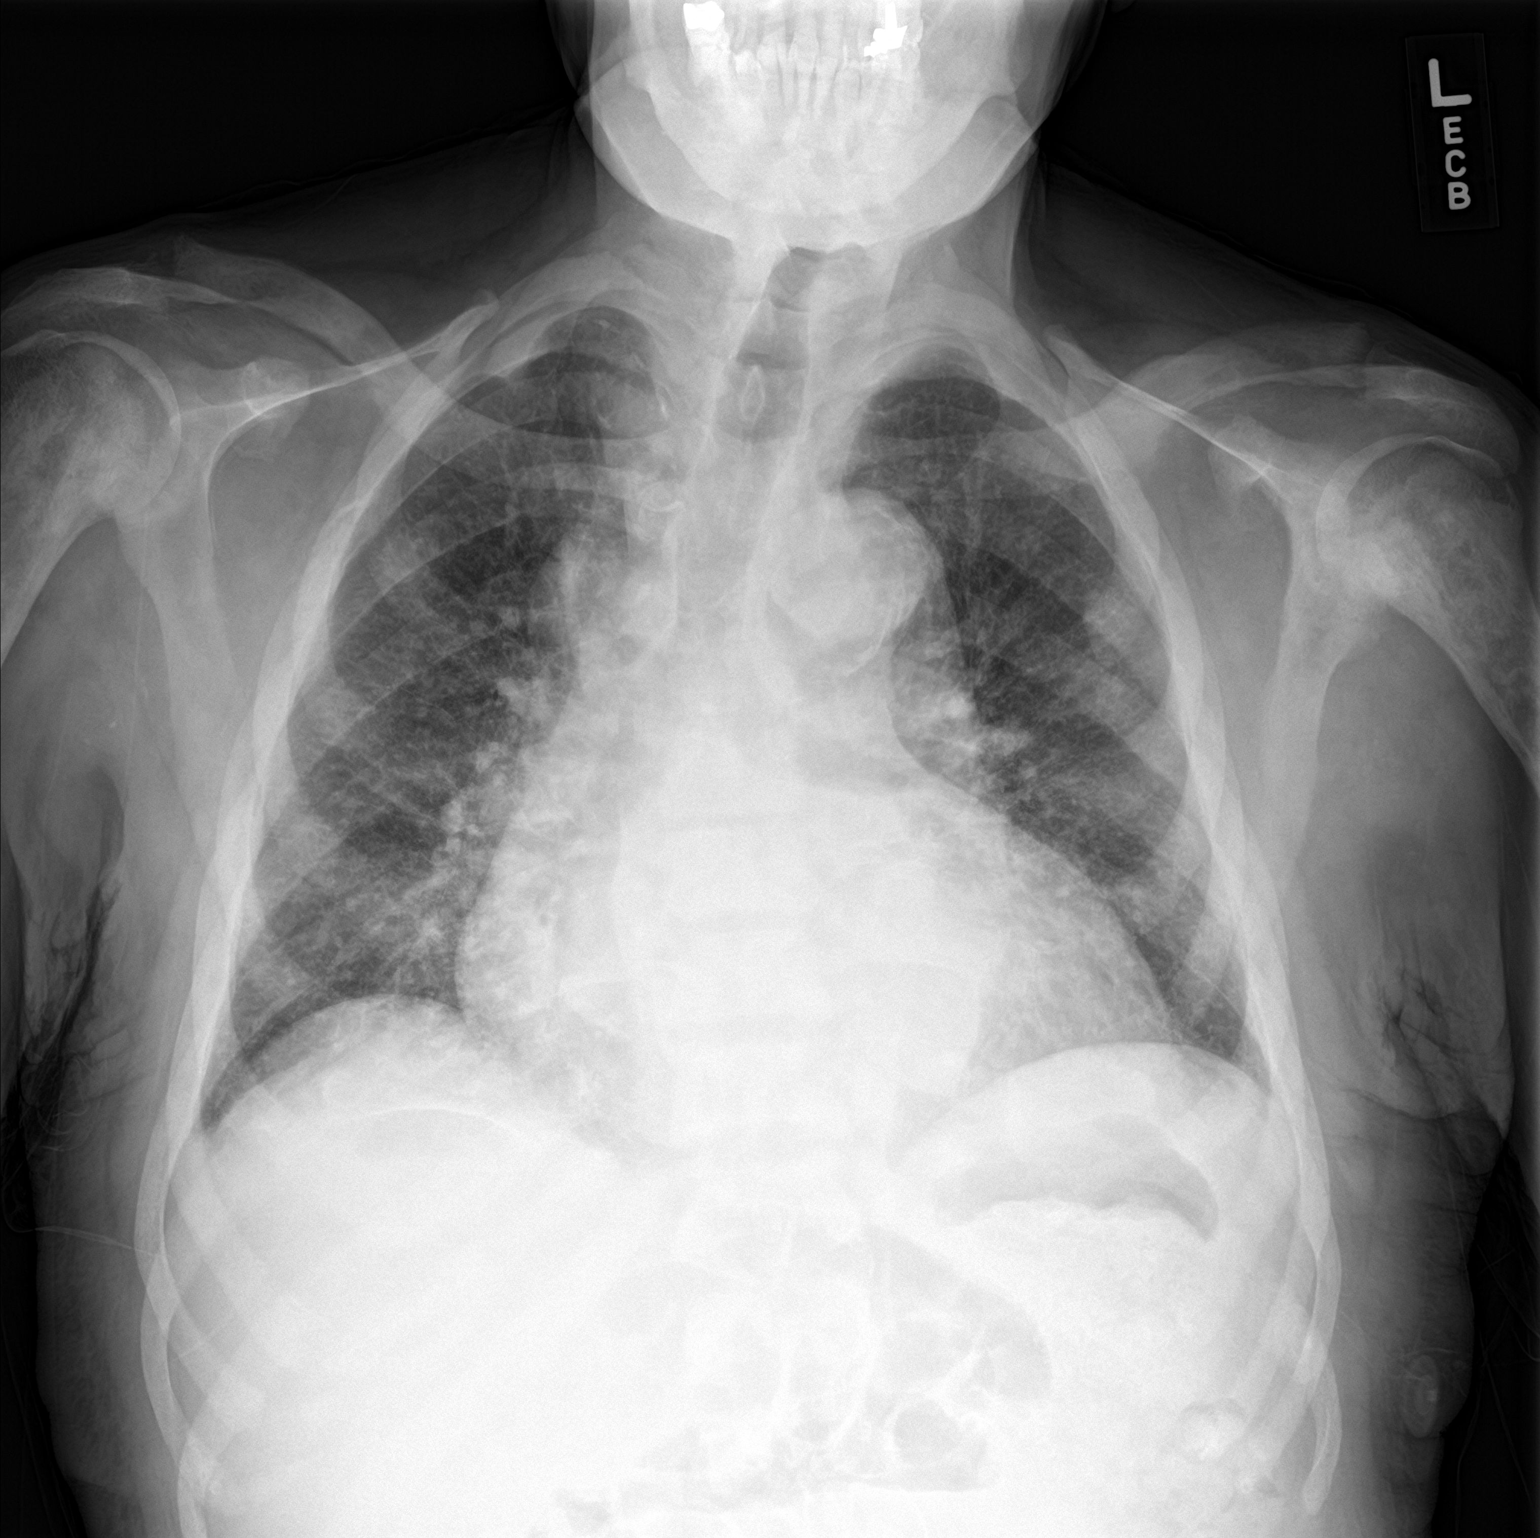

[chest lat]
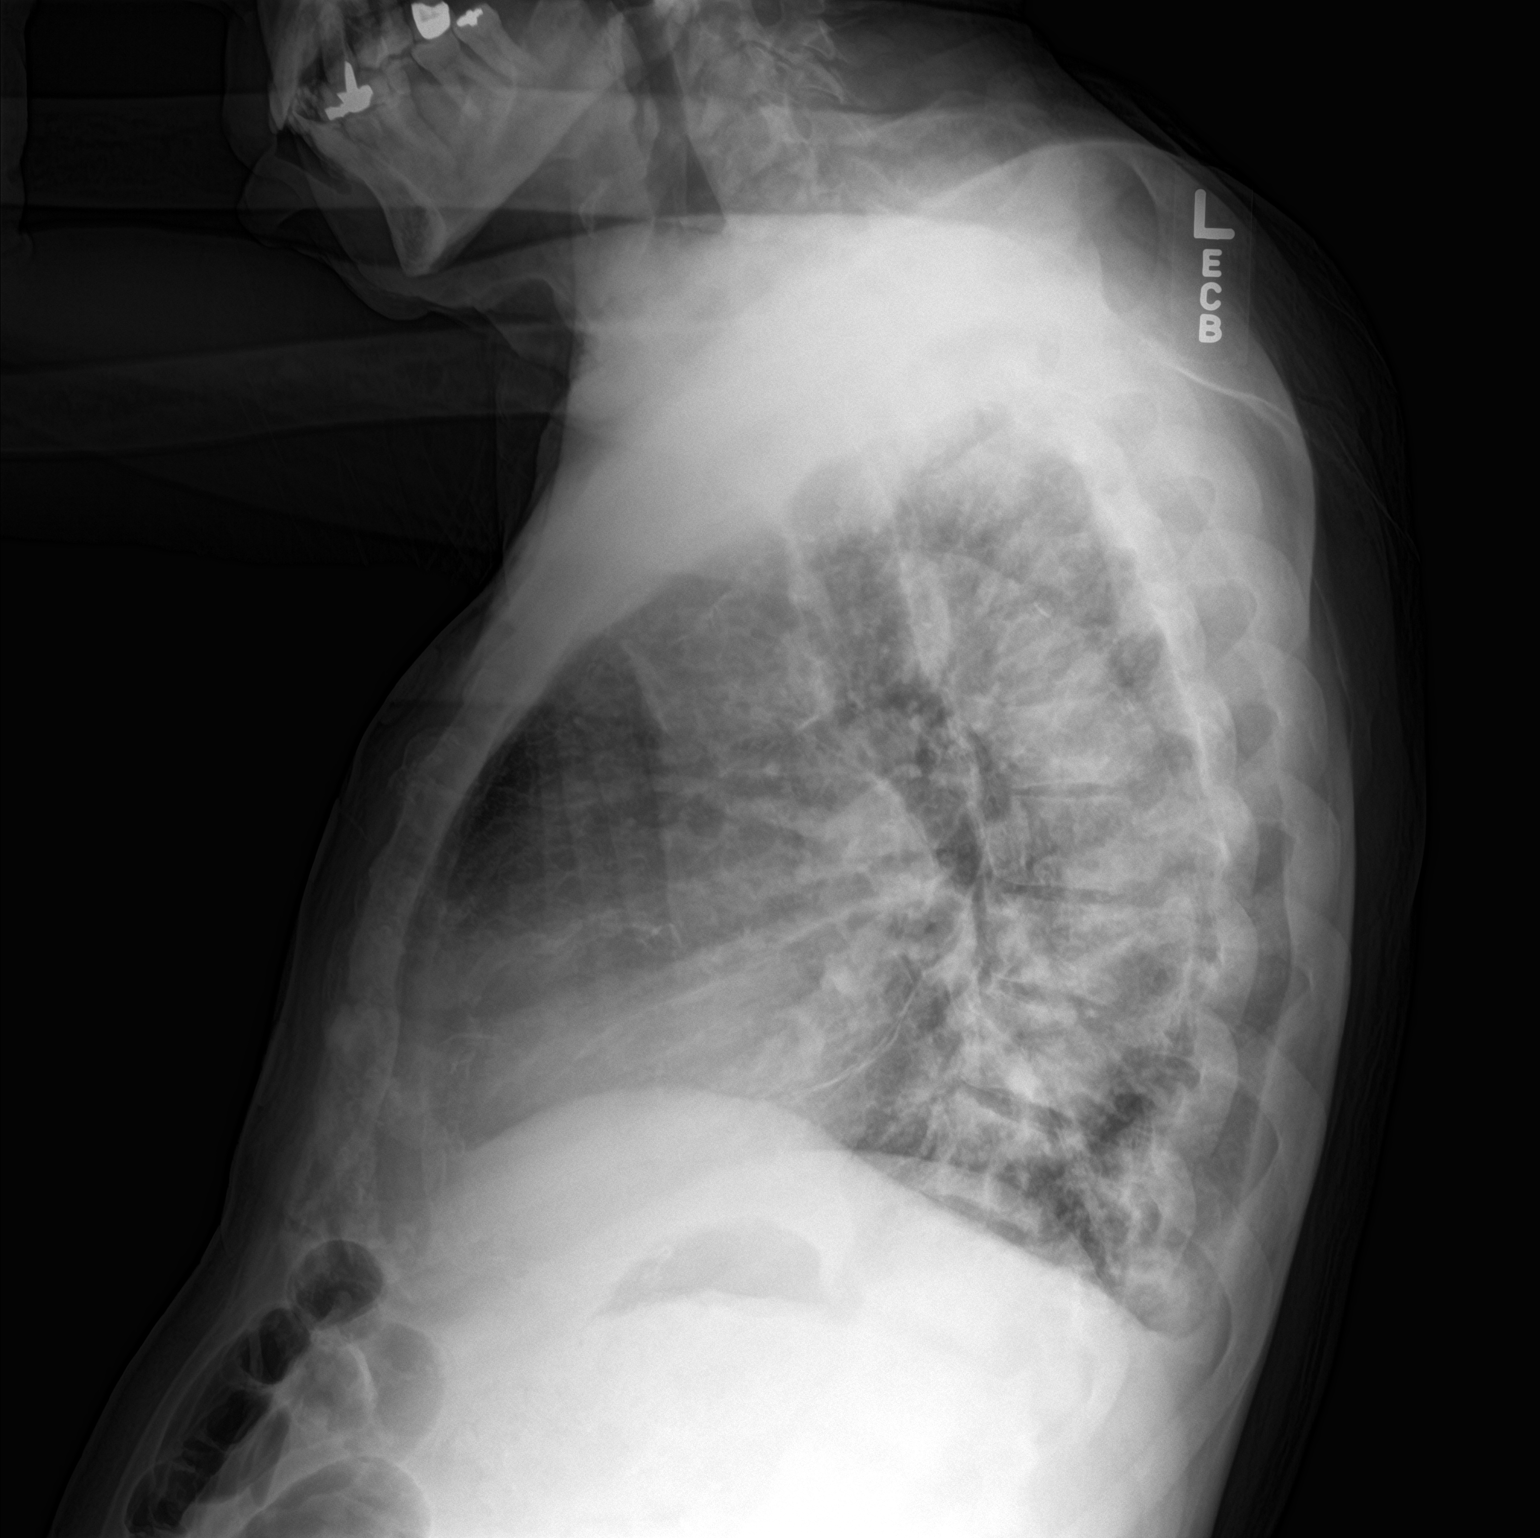

[2 of 2 positions shown; findings below may reference images not displayed]

FINDINGS: Cardiomegaly. Atherosclerotic and tortuous thoracic aorta. Pulmonary
vascular congestion with mild interstitial edema. No focal airspace
consolidation, large pleural effusion or pneumothorax. Stable right
apical pleural parenchymal scarring. Irregular patchy multifocal
regions of sclerosis throughout the visualized bones consistent with
known widespread metastatic disease.
IMPRESSION: No significant interval change in the appearance of the chest
compared to prior imaging.

Cardiomegaly and pulmonary vascular congestion without overt edema.

Diffuse scattered sclerotic osseous metastatic disease.

## 2019-02-01 ENCOUNTER — Other Ambulatory Visit: Payer: Self-pay | Admitting: *Deleted
